# Patient Record
Sex: Female | Born: 1955
Health system: Southern US, Community
[De-identification: ages and names within clinical notes are randomized; demographics above are authoritative.]

## PROBLEM LIST (undated history)

## (undated) DIAGNOSIS — R0600 Dyspnea, unspecified: Secondary | ICD-10-CM

## (undated) DIAGNOSIS — K5790 Diverticulosis of intestine, part unspecified, without perforation or abscess without bleeding: Secondary | ICD-10-CM

## (undated) DIAGNOSIS — Z8719 Personal history of other diseases of the digestive system: Secondary | ICD-10-CM

## (undated) DIAGNOSIS — M1990 Unspecified osteoarthritis, unspecified site: Secondary | ICD-10-CM

## (undated) DIAGNOSIS — R112 Nausea with vomiting, unspecified: Secondary | ICD-10-CM

## (undated) DIAGNOSIS — D473 Essential (hemorrhagic) thrombocythemia: Secondary | ICD-10-CM

## (undated) DIAGNOSIS — I4891 Unspecified atrial fibrillation: Secondary | ICD-10-CM

## (undated) DIAGNOSIS — C73 Malignant neoplasm of thyroid gland: Secondary | ICD-10-CM

## (undated) DIAGNOSIS — R202 Paresthesia of skin: Secondary | ICD-10-CM

## (undated) DIAGNOSIS — K219 Gastro-esophageal reflux disease without esophagitis: Secondary | ICD-10-CM

## (undated) DIAGNOSIS — N2889 Other specified disorders of kidney and ureter: Secondary | ICD-10-CM

## (undated) DIAGNOSIS — D509 Iron deficiency anemia, unspecified: Secondary | ICD-10-CM

## (undated) DIAGNOSIS — F329 Major depressive disorder, single episode, unspecified: Secondary | ICD-10-CM

## (undated) DIAGNOSIS — C641 Malignant neoplasm of right kidney, except renal pelvis: Secondary | ICD-10-CM

## (undated) DIAGNOSIS — M2140 Flat foot [pes planus] (acquired), unspecified foot: Secondary | ICD-10-CM

## (undated) DIAGNOSIS — F32A Depression, unspecified: Secondary | ICD-10-CM

## (undated) DIAGNOSIS — M25579 Pain in unspecified ankle and joints of unspecified foot: Secondary | ICD-10-CM

## (undated) DIAGNOSIS — R000 Tachycardia, unspecified: Secondary | ICD-10-CM

## (undated) DIAGNOSIS — R200 Anesthesia of skin: Secondary | ICD-10-CM

## (undated) DIAGNOSIS — E785 Hyperlipidemia, unspecified: Secondary | ICD-10-CM

## (undated) DIAGNOSIS — I499 Cardiac arrhythmia, unspecified: Secondary | ICD-10-CM

## (undated) DIAGNOSIS — E559 Vitamin D deficiency, unspecified: Secondary | ICD-10-CM

## (undated) DIAGNOSIS — E039 Hypothyroidism, unspecified: Secondary | ICD-10-CM

## (undated) DIAGNOSIS — E079 Disorder of thyroid, unspecified: Secondary | ICD-10-CM

## (undated) DIAGNOSIS — E538 Deficiency of other specified B group vitamins: Secondary | ICD-10-CM

## (undated) DIAGNOSIS — R002 Palpitations: Secondary | ICD-10-CM

## (undated) DIAGNOSIS — Z9889 Other specified postprocedural states: Secondary | ICD-10-CM

## (undated) DIAGNOSIS — K909 Intestinal malabsorption, unspecified: Secondary | ICD-10-CM

## (undated) DIAGNOSIS — D75839 Thrombocytosis, unspecified: Secondary | ICD-10-CM

## (undated) DIAGNOSIS — I10 Essential (primary) hypertension: Secondary | ICD-10-CM

## (undated) HISTORY — DX: Intestinal malabsorption, unspecified: K90.9

## (undated) HISTORY — DX: Gastro-esophageal reflux disease without esophagitis: K21.9

## (undated) HISTORY — PX: BREAST BIOPSY: SHX20

## (undated) HISTORY — DX: Major depressive disorder, single episode, unspecified: F32.9

## (undated) HISTORY — DX: Essential (hemorrhagic) thrombocythemia: D47.3

## (undated) HISTORY — DX: Disorder of thyroid, unspecified: E07.9

## (undated) HISTORY — DX: Malignant neoplasm of thyroid gland: C73

## (undated) HISTORY — DX: Malignant neoplasm of right kidney, except renal pelvis: C64.1

## (undated) HISTORY — DX: Palpitations: R00.2

## (undated) HISTORY — DX: Vitamin D deficiency, unspecified: E55.9

## (undated) HISTORY — DX: Paresthesia of skin: R20.0

## (undated) HISTORY — DX: Unspecified atrial fibrillation: I48.91

## (undated) HISTORY — DX: Depression, unspecified: F32.A

## (undated) HISTORY — DX: Flat foot (pes planus) (acquired), unspecified foot: M21.40

## (undated) HISTORY — DX: Essential (primary) hypertension: I10

## (undated) HISTORY — DX: Hyperlipidemia, unspecified: E78.5

## (undated) HISTORY — DX: Pain in unspecified ankle and joints of unspecified foot: M25.579

## (undated) HISTORY — DX: Paresthesia of skin: R20.2

---

## 1990-07-16 HISTORY — PX: CHOLECYSTECTOMY: SHX55

## 2002-07-01 ENCOUNTER — Ambulatory Visit (HOSPITAL_COMMUNITY): Admission: RE | Admit: 2002-07-01 | Discharge: 2002-07-01 | Payer: Self-pay | Admitting: Obstetrics & Gynecology

## 2002-07-01 ENCOUNTER — Encounter: Payer: Self-pay | Admitting: Obstetrics & Gynecology

## 2004-07-16 ENCOUNTER — Emergency Department (HOSPITAL_COMMUNITY): Admission: EM | Admit: 2004-07-16 | Discharge: 2004-07-16 | Payer: Self-pay | Admitting: Emergency Medicine

## 2013-04-09 ENCOUNTER — Other Ambulatory Visit: Payer: Self-pay | Admitting: Family Medicine

## 2013-04-09 ENCOUNTER — Ambulatory Visit (INDEPENDENT_AMBULATORY_CARE_PROVIDER_SITE_OTHER): Payer: 59 | Admitting: Family Medicine

## 2013-04-09 ENCOUNTER — Ambulatory Visit: Payer: 59

## 2013-04-09 VITALS — BP 118/92 | HR 100 | Temp 98.5°F | Resp 18 | Ht 63.0 in | Wt 188.0 lb

## 2013-04-09 DIAGNOSIS — M25519 Pain in unspecified shoulder: Secondary | ICD-10-CM

## 2013-04-09 DIAGNOSIS — R899 Unspecified abnormal finding in specimens from other organs, systems and tissues: Secondary | ICD-10-CM

## 2013-04-09 DIAGNOSIS — R509 Fever, unspecified: Secondary | ICD-10-CM

## 2013-04-09 DIAGNOSIS — E0590 Thyrotoxicosis, unspecified without thyrotoxic crisis or storm: Secondary | ICD-10-CM

## 2013-04-09 DIAGNOSIS — M25511 Pain in right shoulder: Secondary | ICD-10-CM

## 2013-04-09 LAB — POCT URINALYSIS DIPSTICK
Bilirubin, UA: NEGATIVE
Blood, UA: NEGATIVE
Glucose, UA: NEGATIVE
Ketones, UA: NEGATIVE
Leukocytes, UA: NEGATIVE
Nitrite, UA: NEGATIVE
Protein, UA: NEGATIVE
Spec Grav, UA: 1.01
Urobilinogen, UA: 0.2
pH, UA: 6

## 2013-04-09 LAB — POCT CBC
Granulocyte percent: 68.4 %G (ref 37–80)
HCT, POC: 43.2 % (ref 37.7–47.9)
Hemoglobin: 13.9 g/dL (ref 12.2–16.2)
Lymph, poc: 2.4 (ref 0.6–3.4)
MCH, POC: 27.7 pg (ref 27–31.2)
MCHC: 32.2 g/dL (ref 31.8–35.4)
MCV: 86 fL (ref 80–97)
MID (cbc): 0.8 (ref 0–0.9)
MPV: 9.3 fL (ref 0–99.8)
POC Granulocyte: 6.9 (ref 2–6.9)
POC LYMPH PERCENT: 24 %L (ref 10–50)
POC MID %: 7.6 %M (ref 0–12)
Platelet Count, POC: 456 10*3/uL — AB (ref 142–424)
RBC: 5.02 M/uL (ref 4.04–5.48)
RDW, POC: 14.5 %
WBC: 10.1 10*3/uL (ref 4.6–10.2)

## 2013-04-09 LAB — COMPREHENSIVE METABOLIC PANEL
ALT: 13 U/L (ref 0–35)
AST: 15 U/L (ref 0–37)
Albumin: 3.8 g/dL (ref 3.5–5.2)
Alkaline Phosphatase: 68 U/L (ref 39–117)
BUN: 7 mg/dL (ref 6–23)
CO2: 27 mEq/L (ref 19–32)
Calcium: 9.6 mg/dL (ref 8.4–10.5)
Chloride: 100 mEq/L (ref 96–112)
Creat: 0.46 mg/dL — ABNORMAL LOW (ref 0.50–1.10)
Glucose, Bld: 94 mg/dL (ref 70–99)
Potassium: 4.2 mEq/L (ref 3.5–5.3)
Sodium: 138 mEq/L (ref 135–145)
Total Bilirubin: 0.4 mg/dL (ref 0.3–1.2)
Total Protein: 7.4 g/dL (ref 6.0–8.3)

## 2013-04-09 LAB — POCT UA - MICROSCOPIC ONLY
RBC, urine, microscopic: NEGATIVE
WBC, Ur, HPF, POC: NEGATIVE

## 2013-04-09 LAB — POCT SEDIMENTATION RATE: POCT SED RATE: 67 mm/hr — AB (ref 0–22)

## 2013-04-09 LAB — TSH: TSH: 0.01 u[IU]/mL — ABNORMAL LOW (ref 0.350–4.500)

## 2013-04-09 LAB — CK: Total CK: 20 U/L (ref 7–177)

## 2013-04-09 MED ORDER — CYCLOBENZAPRINE HCL 10 MG PO TABS: 10.0000 mg | ORAL_TABLET | Freq: Two times a day (BID) | ORAL | Status: DC | PRN

## 2013-04-09 MED ORDER — DOXYCYCLINE HYCLATE 100 MG PO TABS: 100.0000 mg | ORAL_TABLET | Freq: Two times a day (BID) | ORAL | Status: DC

## 2013-04-09 NOTE — Patient Instructions (Addendum)
I will be in touch with you when the rest of your labs come in. Please try the flexeril for your neck pain, and the doxycycline for any potential cause of your fever.    Please let me know if you are not feeling better in the next few days-Sooner if worse.   Please schedule a mammogram soon.

## 2013-04-09 NOTE — Progress Notes (Signed)
[facility] [address] [place] [address] [phone]  Date:  04/09/2013   Name:  Karen Wall   DOB:  06/12/1956   MRN:  [id number]  PCP:  No PCP Per Patient    Chief Complaint: Shoulder Pain and Fever   History of Present Illness:  Karen Wall is a 57 y.o. very pleasant female patient who presents with the following:  Today is Thursday.  She woke up on Monday with an ache in her right shoulder.  That evening she had a temp of 104.  The next day she felt better although her arm continued to hurt.  Tuesday evening her temp was 102.  Her temp has continued to be 102 every evening.  She has not noted cough, nausea/ vomiting, no diarrhea. She has a HA but only when her temperature is up.   No ST, no earache, no sneezing, no runny nose No chest pain.    She is generally very healthy.  No specific sick contacts but she is an Charity fundraiser at [employer].  She did take tylneol, last at 3am today.   She has been off of work this week   No chest pain while working or while active.    Her father had several MIs- he died at 45 after 2 pacemakers.  Her mother is healthy at 80.  There is a lot of CAD/ MI on her father's side. Never a smoker She does walk about 2 miles a day- no chest pain.  She does sometimes note occasional palpitations but this is not new She does not have a cardiologist, has never had a stress test.   There are no active problems to display for this patient.   History reviewed. No pertinent past medical history.  History reviewed. No pertinent past surgical history.  History  Substance Use Topics  . Smoking status: Never Smoker   . Smokeless tobacco: Not on file  . Alcohol Use: No    Family History  Problem Relation Age of Onset  . Hypertension Mother   . Mental illness Father   . Mental illness Maternal Grandmother   . Stroke Maternal Grandfather     Allergies  Allergen Reactions  . Penicillins     Medication list has been  reviewed and updated.  No current outpatient prescriptions on file prior to visit.   No current facility-administered medications on file prior to visit.    Review of Systems:  As per HPI- otherwise negative.   Physical Examination: Filed Vitals:   04/09/13 1118  BP: 118/92  Pulse: 100  Temp: 98.5 F (36.9 C)  Resp: 18   Filed Vitals:   04/09/13 1118  Height: 5\' 3"  (1.6 m)  Weight: 188 lb (85.276 kg)   Body mass index is 33.31 kg/(m^2). Ideal Body Weight: Weight in (lb) to have BMI = 25: 140.8  GEN: WDWN, NAD, Non-toxic, A & O x 3 HEENT: Atraumatic, Normocephalic. Neck supple. No masses, No LAD. Bilateral TM wnl, oropharynx normal.  PEERL,EOMI.   Ears and Nose: No external deformity. CV: RRR, No M/G/R. No JVD. No thrill. No extra heart sounds. PULM: CTA B, no wheezes, crackles, rhonchi. No retractions. No resp. distress. No accessory muscle use. ABD: S, NT, ND. No rebound. No HSM. EXTR: No c/c/e NEURO Normal gait.  PSYCH: Normally interactive. Conversant. Not depressed or anxious appearing.  Calm demeanor.  She is very tender in the right trapezius muscles.  No rash, lesion, or redness.  She is also tender in the right sided cervical muscles, but less so than the trapezius.  She has normal ROM of the shoulder, no pain with shoulder movement.   Breasts: normal bilaterally, no discharge, dimpling or  Normal cervical ROM.    [facility] reading (PRIMARY) by  Dr. Patsy Lager. CXR: negative, mild degenerative change Cervical spine: loss of lordosis, otherwise negative  CERVICAL SPINE - COMPLETE 4+ VIEW  Comparison: None.  Findings:  C1 to the superior endplate of T2 is imaged on the provided lateral radiograph.  There is minimal straightening of the expected cervical lordosis. No anterolisthesis or retrolisthesis. The bilateral facets are normally aligned. The dens is normally positioned between the lateral masses of C1.  Cervical vertebral body heights are preserved.  Prevertebral soft tissues are normal.  Intervertebral disc spaces are preserved.  There is very minimal rightward deviation of the mid aspect of the tracheal air column cranial to the thoracic inlet. Limited visualization of the lung apices is normal.  IMPRESSION: 1. No explanation for patient's right-sided shoulder pain. Specifically, no evidence of significant cervical DDD.  2. Very mild rightward deviation of the mid aspect of the tracheal air column cranial to the thoracic inlet, nonspecific but could be seen with enlargement of the left lobe of the thyroid. Clinical correlation is advised. Further evaluation with thyroid ultrasound may be performed as clinically indicated.  CHEST - 2 VIEW  Comparison: None.  Findings:  Normal cardiac silhouette and mediastinal contours. Retrocardiac air and fluid containing structure is compatible with a hiatal hernia. No focal airspace opacities. No pleural effusion or pneumothorax. No evidence of edema. Degenerative change within the mid/lower thoracic spine. Post cholecystectomy.  IMPRESSION: 1. No acute cardiopulmonary disease. Specifically, no evidence of pneumonia. 2. Small hiatal hernia.  EKG: minimal ST depression in chest leads.  Spoke with Dr. Tenny Craw- insignificant. No evidence of pericarditis.    Results for orders placed in visit on 04/09/13  POCT CBC      Result Value Range   WBC 10.1  4.6 - 10.2 K/uL   Lymph, poc 2.4  0.6 - 3.4   POC LYMPH PERCENT 24.0  10 - 50 %L   MID (cbc) 0.8  0 - 0.9   POC MID % 7.6  0 - 12 %M   POC Granulocyte 6.9  2 - 6.9   Granulocyte percent 68.4  37 - 80 %G   RBC 5.02  4.04 - 5.48 M/uL   Hemoglobin 13.9  12.2 - 16.2 g/dL   HCT, POC 40.9  81.1 - 47.9 %   MCV 86.0  80 - 97 fL   MCH, POC 27.7  27 - 31.2 pg   MCHC 32.2  31.8 - 35.4 g/dL   RDW, POC 91.4     Platelet Count, POC 456 (*) 142 - 424 K/uL   MPV 9.3  0 - 99.8 fL  POCT SEDIMENTATION RATE      Result Value Range   POCT SED RATE  67 (*) 0 - 22 mm/hr  POCT UA - MICROSCOPIC ONLY      Result Value Range   WBC, Ur, HPF, POC neg     RBC, urine, microscopic neg     Bacteria, U Microscopic neg     Mucus, UA neg     Epithelial cells, urine per micros 0-2     Crystals, Ur, HPF, POC neg     Casts, Ur, LPF, POC neg     Yeast, UA neg  POCT URINALYSIS DIPSTICK      Result Value Range   Color, UA yellow     Clarity, UA clear     Glucose, UA neg     Bilirubin, UA neg     Ketones, UA neg     Spec Grav, UA 1.010     Blood, UA neg     pH, UA 6.0     Protein, UA neg     Urobilinogen, UA 0.2     Nitrite, UA neg     Leukocytes, UA Negative      Assessment and Plan: Pain in right shoulder - Plan: CK, DG Chest 2 View, DG Cervical Spine Complete, EKG 12-Lead, cyclobenzaprine (FLEXERIL) 10 MG tablet  Fever, unspecified - Plan: POCT CBC, Comprehensive metabolic panel, POCT SEDIMENTATION RATE, doxycycline (VIBRA-TABS) 100 MG tablet, POCT UA - Microscopic Only, POCT urinalysis dipstick, Urine culture, TSH  Tenderness in the right trapezius muscle, and fever of unknown origin.   Urine is benign, no evidence of pneumonia, abdomen benign.  Await other labs as above.  Flexeril for muscle pain.  Doxycycline for any possible tick borne illness.    Signed Abbe Amsterdam, MD

## 2013-04-10 ENCOUNTER — Other Ambulatory Visit: Payer: Self-pay

## 2013-04-10 ENCOUNTER — Telehealth: Payer: Self-pay | Admitting: Endocrinology

## 2013-04-10 ENCOUNTER — Telehealth: Payer: Self-pay | Admitting: Radiology

## 2013-04-10 ENCOUNTER — Ambulatory Visit
Admission: RE | Admit: 2013-04-10 | Discharge: 2013-04-10 | Disposition: A | Discharge status: Home / Self Care | Payer: 59 | Source: Ambulatory Visit | Attending: Family Medicine | Admitting: Family Medicine

## 2013-04-10 DIAGNOSIS — R7989 Other specified abnormal findings of blood chemistry: Secondary | ICD-10-CM

## 2013-04-10 DIAGNOSIS — E042 Nontoxic multinodular goiter: Secondary | ICD-10-CM

## 2013-04-10 LAB — URINE CULTURE
Colony Count: NO GROWTH
Organism ID, Bacteria: NO GROWTH

## 2013-04-10 LAB — T4, FREE: Free T4: 1.54 ng/dL (ref 0.80–1.80)

## 2013-04-10 MED ORDER — METHIMAZOLE 10 MG PO TABS: 40.0000 mg | ORAL_TABLET | Freq: Two times a day (BID) | ORAL | Status: DC

## 2013-04-10 NOTE — Telephone Encounter (Signed)
Dr. Dallas Schimke called and spoke w/ Dr. Everardo All. Per Dr. Everardo All, pt is to be added on at 100pm on Monday or Tuesday of next week for "thyroid storm". Called pt to sch appt this afternoon, she says she does not believe this is the plan of care for her any longer and she will call us back on Monday / Sherri S.

## 2013-04-10 NOTE — Telephone Encounter (Signed)
IMPRESSION:  Bilateral thyroid nodules, the largest which are mixed solid and  cystic in appearance as detailed above. Fine needle aspirate may be  considered to help exclude the possibility malignancy.   Call report :Patient advised biopsy needed and this has been ordered. To you FYI

## 2013-04-10 NOTE — Progress Notes (Signed)
Received further lab results. Low TSH, she has large thyroid nodules.  Discussed with Dr. Everardo All at [facility].  Plan for close follow-up- the first of the week.  Will treat with tapazole 40 BID starting today.  Discussed with pt.  She had a temp of 100.5 last night, no GI symptoms.

## 2013-04-10 NOTE — Addendum Note (Signed)
Addended by: Abbe Amsterdam C on: 04/10/2013 05:47 PM   Modules accepted: Orders

## 2013-04-10 NOTE — Progress Notes (Signed)
Patient aware TSH was abnormal and appointment for Thyroid U/S today at 12:15.

## 2013-04-11 ENCOUNTER — Other Ambulatory Visit: Payer: Self-pay | Admitting: Endocrinology

## 2013-04-11 ENCOUNTER — Telehealth: Payer: Self-pay | Admitting: Family Medicine

## 2013-04-11 ENCOUNTER — Telehealth: Payer: Self-pay

## 2013-04-11 DIAGNOSIS — E0590 Thyrotoxicosis, unspecified without thyrotoxic crisis or storm: Secondary | ICD-10-CM

## 2013-04-11 MED ORDER — METHIMAZOLE 10 MG PO TABS: 40.0000 mg | ORAL_TABLET | Freq: Two times a day (BID) | ORAL | Status: DC

## 2013-04-11 NOTE — Telephone Encounter (Signed)
Spoke with Robin at [facility]. Tests added.  

## 2013-04-11 NOTE — Telephone Encounter (Signed)
Message copied by Johnnette Litter on Sat Apr 11, 2013  8:58 AM ------      Message from: Abbe Amsterdam C      Created: Thu Apr 09, 2013  4:15 PM       Please add on a CRP, ANA and RF      Muscle pain      Thanks!  JC ------

## 2013-04-11 NOTE — Telephone Encounter (Signed)
please call patient: Yes, please take this medication. It is important to keep the appointment, to get your thyroid better.

## 2013-04-11 NOTE — Telephone Encounter (Signed)
Called to check in with her- she is feeling ok, a little nauseated but she notes that her pulse has come down to 70 BPM, which is normal for her.  I will get her an appt set up with endocrine first thing Monday am

## 2013-04-13 ENCOUNTER — Telehealth: Payer: Self-pay | Admitting: Radiology

## 2013-04-13 ENCOUNTER — Encounter: Payer: Self-pay | Admitting: Endocrinology

## 2013-04-13 ENCOUNTER — Other Ambulatory Visit: Payer: Self-pay | Admitting: Family Medicine

## 2013-04-13 ENCOUNTER — Ambulatory Visit (INDEPENDENT_AMBULATORY_CARE_PROVIDER_SITE_OTHER): Payer: 59 | Admitting: Endocrinology

## 2013-04-13 VITALS — BP 126/80 | HR 75 | Wt 189.0 lb

## 2013-04-13 DIAGNOSIS — E042 Nontoxic multinodular goiter: Secondary | ICD-10-CM

## 2013-04-13 DIAGNOSIS — E0590 Thyrotoxicosis, unspecified without thyrotoxic crisis or storm: Secondary | ICD-10-CM

## 2013-04-13 LAB — ANA: Anti Nuclear Antibody(ANA): NEGATIVE

## 2013-04-13 NOTE — Telephone Encounter (Signed)
Pt advised, please schedule

## 2013-04-13 NOTE — Patient Instructions (Addendum)
Please reduce the methimazole to 10 mg, twice daily. Please come back for a follow-up appointment in 1 month. if ever you have fever while taking methimazole, stop it and call us, because of the risk of a rare side-effect.

## 2013-04-13 NOTE — Progress Notes (Signed)
Subjective:    Patient ID: Karen Wall, female    DOB: 1956-02-01, 57 y.o.   MRN: [id number]  HPI Pt states few months of mild intermittent palpitations in the chest, and associated fatigue.  She has nausea since on the tapazole. No past medical history on file.  No past surgical history on file.  History   Social History  . Marital Status: Divorced    Spouse Name: N/A    Number of Children: N/A  . Years of Education: N/A   Occupational History  . Not on file.   Social History Main Topics  . Smoking status: Never Smoker   . Smokeless tobacco: Not on file  . Alcohol Use: No  . Drug Use: No  . Sexual Activity: Not on file   Other Topics Concern  . Not on file   Social History Narrative  . No narrative on file    Current Outpatient Prescriptions on File Prior to Visit  Medication Sig Dispense Refill  . acetaminophen (TYLENOL) 500 MG tablet Take 500 mg by mouth every 6 (six) hours as needed for pain.      . cyclobenzaprine (FLEXERIL) 10 MG tablet Take 1 tablet (10 mg total) by mouth 2 (two) times daily as needed for muscle spasms.  30 tablet  0  . doxycycline (VIBRA-TABS) 100 MG tablet Take 1 tablet (100 mg total) by mouth 2 (two) times daily.  20 tablet  0  . methimazole (TAPAZOLE) 10 MG tablet Take 4 tablets (40 mg total) by mouth 2 (two) times daily.  120 tablet  0   No current facility-administered medications on file prior to visit.    Allergies  Allergen Reactions  . Penicillins     Family History  Problem Relation Age of Onset  . Hypertension Mother   . Mental illness Father   . Mental illness Maternal Grandmother   . Stroke Maternal Grandfather   no goiter or other thyroid problems.   BP 126/80  Pulse 75  Wt 189 lb (85.73 kg)  BMI 33.49 kg/m2  SpO2 97%  Review of Systems She has difficulty losing weight.  Fever is resolved.  She has headache, menopausal sxs, slight muscle weakness, excessive diaphoresis, and tremor.  She denies hoarseness,  double vision, chest pain, sob, diarrhea, polyuria, numbness, anxiety, easy bruising, and rhinorrhea.      Objective:   Physical Exam VS: see vs page GEN: no distress HEAD: head: no deformity eyes: no periorbital swelling, no proptosis external nose and ears are normal mouth: no lesion seen NECK: 2 large thyroid nodules, 1 on each lobe CHEST WALL: no deformity.   LUNGS:  Clear to auscultation CV: reg rate and rhythm, no murmur ABD: abdomen is soft, nontender.  no hepatosplenomegaly.  not distended.  no hernia MUSCULOSKELETAL: muscle bulk and strength are grossly normal.  no obvious joint swelling.  gait is normal and steady EXTEMITIES: no deformity.  no edema PULSES: no carotid bruit.   NEURO:  cn 2-12 grossly intact.   readily moves all 4's.  sensation is intact to touch on all 4's.  No tremor SKIN:  Normal texture and temperature.  No rash or suspicious lesion is visible.  Not diaphoretic NODES:  None palpable at the neck PSYCH: alert, oriented x3.  Does not appear anxious nor depressed.   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  Nausea, due to tapazole.  Fever, resolved, probably not thyroid-related.

## 2013-04-13 NOTE — Telephone Encounter (Signed)
Spoke to [facility]. Patient to proceed with biopsy of thyroid.

## 2013-04-14 ENCOUNTER — Telehealth: Payer: Self-pay | Admitting: Radiology

## 2013-04-14 ENCOUNTER — Other Ambulatory Visit: Payer: Self-pay | Admitting: Radiology

## 2013-04-14 DIAGNOSIS — E042 Nontoxic multinodular goiter: Secondary | ICD-10-CM

## 2013-04-14 NOTE — Telephone Encounter (Signed)
Patient ordered for thyroid biopsy. But Dr Patsy Lager is unsure if Dr Everardo All wants her to proceed with this. Have left message for patient to call me back to advise.

## 2013-04-15 NOTE — Telephone Encounter (Signed)
I did speak to the patient about the biopsy. Dr Everardo All indicated about 10% of cases are malignant, patient does want to proceed with the biopsy, it has been ordered. To you FYI

## 2013-04-16 ENCOUNTER — Other Ambulatory Visit: Payer: 59

## 2013-04-16 ENCOUNTER — Inpatient Hospital Stay
Admission: RE | Admit: 2013-04-16 | Discharge: 2013-04-16 | Disposition: A | Discharge status: Home / Self Care | Payer: 59 | Source: Ambulatory Visit | Attending: Family Medicine | Admitting: Family Medicine

## 2013-04-21 ENCOUNTER — Ambulatory Visit
Admission: RE | Admit: 2013-04-21 | Discharge: 2013-04-21 | Disposition: A | Discharge status: Home / Self Care | Payer: 59 | Source: Ambulatory Visit | Attending: Family Medicine | Admitting: Family Medicine

## 2013-04-21 ENCOUNTER — Other Ambulatory Visit (HOSPITAL_COMMUNITY)
Admission: RE | Admit: 2013-04-21 | Discharge: 2013-04-21 | Disposition: A | Discharge status: Home / Self Care | Payer: 59 | Source: Ambulatory Visit | Attending: Interventional Radiology | Admitting: Interventional Radiology

## 2013-04-21 DIAGNOSIS — E042 Nontoxic multinodular goiter: Secondary | ICD-10-CM

## 2013-04-21 DIAGNOSIS — E041 Nontoxic single thyroid nodule: Secondary | ICD-10-CM | POA: Insufficient documentation

## 2013-04-22 ENCOUNTER — Telehealth: Payer: Self-pay | Admitting: Family Medicine

## 2013-04-22 NOTE — Telephone Encounter (Signed)
LMOM that her path reports shows a non- neoplastic goiter.  Also sent a flag to Dr. Everardo All that her results are in.

## 2013-04-23 NOTE — Addendum Note (Signed)
Addended by: Abbe Amsterdam C on: 04/23/2013 04:57 PM   Modules accepted: Orders

## 2013-05-12 ENCOUNTER — Ambulatory Visit (INDEPENDENT_AMBULATORY_CARE_PROVIDER_SITE_OTHER): Payer: 59 | Admitting: Endocrinology

## 2013-05-12 ENCOUNTER — Encounter: Payer: Self-pay | Admitting: Endocrinology

## 2013-05-12 VITALS — BP 124/80 | Wt 191.6 lb

## 2013-05-12 DIAGNOSIS — E0590 Thyrotoxicosis, unspecified without thyrotoxic crisis or storm: Secondary | ICD-10-CM

## 2013-05-12 LAB — C-REACTIVE PROTEIN: CRP: 1.2 mg/dL — ABNORMAL HIGH (ref <0.60)

## 2013-05-12 LAB — T4, FREE: Free T4: 0.8 ng/dL (ref 0.80–1.80)

## 2013-05-12 LAB — TSH: TSH: 0.014 u[IU]/mL — ABNORMAL LOW (ref 0.350–4.500)

## 2013-05-12 LAB — SEDIMENTATION RATE: Sed Rate: 11 mm/hr (ref 0–22)

## 2013-05-12 NOTE — Progress Notes (Signed)
  Subjective:    Patient ID: Karen Wall, female    DOB: 26-Nov-1955, 57 y.o.   MRN: [id number]  HPI In September of 2014, pt was dx'ed with hyperthyroidism due to a multinodular goiter.  She chose tapazole rx.  Since the it was reduced, pt states she feels well in general.  No past medical history on file.  No past surgical history on file.  History   Social History  . Marital Status: Divorced    Spouse Name: N/A    Number of Children: N/A  . Years of Education: N/A   Occupational History  . Not on file.   Social History Main Topics  . Smoking status: Never Smoker   . Smokeless tobacco: Not on file  . Alcohol Use: No  . Drug Use: No  . Sexual Activity: Not on file   Other Topics Concern  . Not on file   Social History Narrative  . No narrative on file    Current Outpatient Prescriptions on File Prior to Visit  Medication Sig Dispense Refill  . acetaminophen (TYLENOL) 500 MG tablet Take 500 mg by mouth every 6 (six) hours as needed for pain.      . cyclobenzaprine (FLEXERIL) 10 MG tablet Take 1 tablet (10 mg total) by mouth 2 (two) times daily as needed for muscle spasms.  30 tablet  0  . methimazole (TAPAZOLE) 10 MG tablet Take 10 mg by mouth 2 (two) times daily.      Marland Kitchen doxycycline (VIBRA-TABS) 100 MG tablet Take 1 tablet (100 mg total) by mouth 2 (two) times daily.  20 tablet  0   No current facility-administered medications on file prior to visit.    Allergies  Allergen Reactions  . Penicillins     Family History  Problem Relation Age of Onset  . Hypertension Mother   . Mental illness Father   . Mental illness Maternal Grandmother   . Stroke Maternal Grandfather    BP 124/80  Wt 191 lb 9.6 oz (86.909 kg)  BMI 33.95 kg/m2  Review of Systems Denies fever    Objective:   Physical Exam VITAL SIGNS:  See vs page GENERAL: no distress NECK: 2 large thyroid nodules, 1 on each lobe.  Lab Results  Component Value Date   TSH 0.014* 05/12/2013       Assessment & Plan:  Multinodular goiter, which is usually hereditary. Hyperthyroidism, due to the goiter, improved.  We discussed the rx options.  She has chosen to continue tapazole for now.

## 2013-05-12 NOTE — Patient Instructions (Addendum)
blood tests are being requested for you today.  We'll contact you with results. Please come back for a follow-up appointment in 6 weeks.   if ever you have fever while taking methimazole, stop it and call us, because of the risk of a rare side-effect. 

## 2013-05-13 ENCOUNTER — Encounter: Payer: Self-pay | Admitting: Family Medicine

## 2013-05-21 ENCOUNTER — Other Ambulatory Visit: Payer: Self-pay

## 2013-05-21 ENCOUNTER — Other Ambulatory Visit: Payer: Self-pay | Admitting: Endocrinology

## 2013-05-21 MED ORDER — METHIMAZOLE 10 MG PO TABS: 10.0000 mg | ORAL_TABLET | Freq: Two times a day (BID) | ORAL | Status: DC

## 2013-06-23 ENCOUNTER — Ambulatory Visit (INDEPENDENT_AMBULATORY_CARE_PROVIDER_SITE_OTHER): Payer: 59 | Admitting: Endocrinology

## 2013-06-23 ENCOUNTER — Encounter: Payer: Self-pay | Admitting: Endocrinology

## 2013-06-23 VITALS — BP 128/80 | HR 67 | Temp 98.2°F | Ht 64.0 in | Wt 192.0 lb

## 2013-06-23 DIAGNOSIS — E0590 Thyrotoxicosis, unspecified without thyrotoxic crisis or storm: Secondary | ICD-10-CM

## 2013-06-23 DIAGNOSIS — E042 Nontoxic multinodular goiter: Secondary | ICD-10-CM

## 2013-06-23 LAB — TSH: TSH: 10.64 u[IU]/mL — ABNORMAL HIGH (ref 0.35–5.50)

## 2013-06-23 NOTE — Patient Instructions (Signed)
blood tests are being requested for you today.  We'll contact you with results. Refer to dr gerkin.  you will receive a phone call, about a day and time for an appointment. Please come back for a follow-up appointment in 1 month.

## 2013-06-23 NOTE — Progress Notes (Signed)
   Subjective:    Patient ID: Karen Wall, female    DOB: [birth year], 57 y.o.   MRN: [id number]  HPI In September of 2014, pt was dx'ed with hyperthyroidism due to a multinodular goiter.  She never had XRT.  Bx was benign.  She chose tapazole rx for now, but she wants to consider surgery when TFT are better.  She has a sensation of fullness at the anterior neck.   No past medical history on file.  No past surgical history on file.  History   Social History  . Marital Status: Divorced    Spouse Name: N/A    Number of Children: N/A  . Years of Education: N/A   Occupational History  . Not on file.   Social History Main Topics  . Smoking status: Never Smoker   . Smokeless tobacco: Not on file  . Alcohol Use: No  . Drug Use: No  . Sexual Activity: Not on file   Other Topics Concern  . Not on file   Social History Narrative  . No narrative on file    Current Outpatient Prescriptions on File Prior to Visit  Medication Sig Dispense Refill  . acetaminophen (TYLENOL) 500 MG tablet Take 500 mg by mouth every 6 (six) hours as needed for pain.      . cyclobenzaprine (FLEXERIL) 10 MG tablet Take 1 tablet (10 mg total) by mouth 2 (two) times daily as needed for muscle spasms.  30 tablet  0  . doxycycline (VIBRA-TABS) 100 MG tablet Take 1 tablet (100 mg total) by mouth 2 (two) times daily.  20 tablet  0   No current facility-administered medications on file prior to visit.   Allergies  Allergen Reactions  . Penicillins    Family History  Problem Relation Age of Onset  . Hypertension Mother   . Mental illness Father   . Mental illness Maternal Grandmother   . Stroke Maternal Grandfather    BP 128/80  Pulse 67  Temp(Src) 98.2 F (36.8 C) (Oral)  Ht 5\' 4"  (1.626 m)  Wt 192 lb (87.091 kg)  BMI 32.94 kg/m2  SpO2 98%  Review of Systems She has weight gain    Objective:   Physical Exam VITAL SIGNS:  See vs page GENERAL: no distress NECK: 2 large thyroid nodules, 1  on each lobe.   Lab Results  Component Value Date   TSH 10.64* 06/23/2013      Assessment & Plan:  Multinodular goiter clinically stable Hyperthyroidism, due to the goiter.  overcontrolled.

## 2013-06-29 ENCOUNTER — Encounter (INDEPENDENT_AMBULATORY_CARE_PROVIDER_SITE_OTHER): Payer: Self-pay | Admitting: Surgery

## 2013-06-29 ENCOUNTER — Ambulatory Visit (INDEPENDENT_AMBULATORY_CARE_PROVIDER_SITE_OTHER): Payer: Commercial Managed Care - PPO | Admitting: Surgery

## 2013-06-29 VITALS — BP 118/76 | HR 68 | Temp 98.1°F | Resp 16 | Ht 64.0 in | Wt 191.8 lb

## 2013-06-29 DIAGNOSIS — E042 Nontoxic multinodular goiter: Secondary | ICD-10-CM

## 2013-06-29 DIAGNOSIS — E0590 Thyrotoxicosis, unspecified without thyrotoxic crisis or storm: Secondary | ICD-10-CM

## 2013-06-29 NOTE — Patient Instructions (Signed)

## 2013-06-29 NOTE — Progress Notes (Signed)
General Surgery - [facility]  Chief Complaint  Karen presents with  . New Evaluation    multinodular goiter with hyperthyroidism - referral from Dr. Sean Ellison, [place]    HISTORY: Karen is a 57-year-old female referred by her endocrinologist for multinodular goiter with hyperthyroidism. Karen has been on methimazole since September 2014. Ultrasound shows an enlarged thyroid gland with multiple bilateral nodules. The largest nodule in the right measures 3.7 cm in the largest nodule on the left measures 3.0 cm. Fine-needle aspiration biopsy was performed and shows changes consistent with nonneoplastic goiter. Karen is currently taking Tapazole 20 mg daily.  TSH levels were markedly suppressed at 0.010.  On Tapazole, her current level is just over 10.  Karen has no prior history of head or neck surgery. She had never been on thyroid medication prior to September. There is no family history of thyroid disease and no family history of other endocrinopathy.  Past Medical History  Diagnosis Date  . GERD (gastroesophageal reflux disease)   . Thyroid disease     hyperthyroidism    Current Outpatient Prescriptions  Medication Sig Dispense Refill  . acetaminophen (TYLENOL) 500 MG tablet Take 500 mg by mouth every 6 (six) hours Karen needed for pain.      . cyclobenzaprine (FLEXERIL) 10 MG tablet Take 1 tablet (10 mg total) by mouth 2 (two) times daily Karen needed for muscle spasms.  30 tablet  0  . diphenhydrAMINE (BENADRYL) 25 MG tablet Take 25 mg by mouth every 6 (six) hours Karen needed.      . methimazole (TAPAZOLE) 10 MG tablet Take 10 mg by mouth daily.       No current facility-administered medications for this visit.    Allergies  Allergen Reactions  . Penicillins     Family History  Problem Relation Age of Onset  . Hypertension Mother   . Mental illness Father   . Mental illness Maternal Grandmother   . Stroke Maternal Grandfather     History    Social History  . Marital Status: Divorced    Spouse Name: N/A    Number of Children: N/A  . Years of Education: N/A   Social History Main Topics  . Smoking status: Never Smoker   . Smokeless tobacco: None  . Alcohol Use: No  . Drug Use: No  . Sexual Activity: None   Other Topics Concern  . None   Social History Narrative  . None    REVIEW OF SYSTEMS - PERTINENT POSITIVES ONLY: Denies tremors. Denies palpitations. Denies compressive symptoms. Denies palpable masses with the exception of the dominant nodule in the left thyroid lobe.  EXAM: Filed Vitals:   06/29/13 1510  BP: 118/76  Pulse: 68  Temp: 98.1 F (36.7 C)  Resp: 16    GENERAL: well-developed, well-nourished, no acute distress HEENT: normocephalic; pupils equal and reactive; sclerae clear; dentition good; mucous membranes moist NECK:  Palpable dominant nodule mid left thyroid lobe, approximately 3 cm, mobile, nontender; right thyroid lobe is firm without dominant mass, nontender; asymmetric on extension; no palpable anterior or posterior cervical lymphadenopathy; no supraclavicular masses; no tenderness CHEST: clear to auscultation bilaterally without rales, rhonchi, or wheezes CARDIAC: regular rate and rhythm without significant murmur; peripheral pulses are full EXT:  non-tender without edema; no deformity NEURO: no gross focal deficits; no sign of tremor   LABORATORY RESULTS: See [facility] ([facility]) for most recent results  RADIOLOGY RESULTS: See [facility] ([facility]pic) for most recent results    IMPRESSION: Toxic multinodular goiter, 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 She had discussed radioactive iodine treatment with her endocrinologist. Given the fact that she has large bilateral thyroid nodules and an element of tracheal deviation on chest x-ray, I believe she would be better served with total thyroidectomy for  management of multinodular goiter with hyperthyroidism. Karen and I discussed the risks benefits of the procedure including the potential for recurrent laryngeal nerve injury and injury to parathyroid glands. We discussed the hospital stay to be anticipated. We discussed the location of the surgical incision and its postoperative wound care. We discussed her recovery in time out of work. We discussed the need for lifelong thyroid hormone replacement. She understands and wishes to proceed in the near future.  The risks and benefits of the procedure have been discussed at length with the Karen.  The Karen understands the proposed procedure, potential alternative treatments, and the course of recovery to be expected.  All of the Karen's questions have been answered at this time.  The Karen wishes to proceed with surgery.  Jonette Wassel M. Kharisma Glasner, MD, FACS General & Endocrine Surgery [facility]  Primary Care Physician: COPLAND,JESSICA, MD   

## 2013-07-01 ENCOUNTER — Encounter (HOSPITAL_COMMUNITY): Payer: Self-pay | Admitting: Pharmacy Technician

## 2013-07-01 NOTE — Patient Instructions (Addendum)
AMBERLEY HAMLER  07/01/2013                           YOUR PROCEDURE IS SCHEDULED ON: 07/03/13               PLEASE REPORT TO SHORT STAY CENTER AT : 12:30 pm               CALL THIS NUMBER IF ANY PROBLEMS THE DAY OF SURGERY :               [phone]                      REMEMBER:   Do not eat food or drink liquids AFTER MIDNIGHT  May have clear liquids UNTIL 6 HOURS BEFORE SURGERY (9:00 am)  Clear liquids include soda, tea, black coffee, apple or grape juice, broth.  Take these medicines the morning of surgery with A SIP OF WATER: none   Do not wear jewelry, make-up   Do not wear lotions, powders, or perfumes.   Do not shave legs or underarms 12 hrs. before surgery (men may shave face)  Do not bring valuables to the hospital.  Contacts, dentures or bridgework may not be worn into surgery.  Leave suitcase in the car. After surgery it may be brought to your room.  For patients admitted to the hospital more than one night, checkout time is 11:00                          The day of discharge.   Patients discharged the day of surgery will not be allowed to drive home                             If going home same day of surgery, must have someone stay with you first                           24 hrs at home and arrange for some one to drive you home from hospital.    Special Instructions:   Please read over the following fact sheets that you were given:               1. Stop aspirin  And herbal meds 7 days preop                      2. [place] PREPARING FOR SURGERY SHEET                                                X_____________________________________________________________________        Failure to follow these instructions may result in cancellation of your surgery

## 2013-07-02 ENCOUNTER — Other Ambulatory Visit (INDEPENDENT_AMBULATORY_CARE_PROVIDER_SITE_OTHER): Payer: Self-pay

## 2013-07-02 ENCOUNTER — Encounter (INDEPENDENT_AMBULATORY_CARE_PROVIDER_SITE_OTHER): Payer: Self-pay | Admitting: Surgery

## 2013-07-02 ENCOUNTER — Other Ambulatory Visit (INDEPENDENT_AMBULATORY_CARE_PROVIDER_SITE_OTHER): Payer: Self-pay | Admitting: Surgery

## 2013-07-02 ENCOUNTER — Telehealth (INDEPENDENT_AMBULATORY_CARE_PROVIDER_SITE_OTHER): Payer: Self-pay

## 2013-07-02 ENCOUNTER — Encounter (HOSPITAL_COMMUNITY)
Admission: RE | Admit: 2013-07-02 | Discharge: 2013-07-02 | Disposition: A | Discharge status: Home / Self Care | Payer: 59 | Source: Ambulatory Visit | Attending: Surgery | Admitting: Surgery

## 2013-07-02 ENCOUNTER — Encounter (HOSPITAL_COMMUNITY): Payer: Self-pay

## 2013-07-02 DIAGNOSIS — G8918 Other acute postprocedural pain: Secondary | ICD-10-CM

## 2013-07-02 HISTORY — DX: Personal history of other diseases of the digestive system: Z87.19

## 2013-07-02 HISTORY — DX: Anesthesia of skin: R20.0

## 2013-07-02 HISTORY — DX: Cardiac arrhythmia, unspecified: I49.9

## 2013-07-02 LAB — COMPREHENSIVE METABOLIC PANEL
ALT: 12 U/L (ref 0–35)
AST: 17 U/L (ref 0–37)
CO2: 29 mEq/L (ref 19–32)
Chloride: 104 mEq/L (ref 96–112)
Creatinine, Ser: 0.57 mg/dL (ref 0.50–1.10)
GFR calc Af Amer: >90 mL/min (ref >90)
GFR calc non Af Amer: >90 mL/min (ref >90)
Glucose, Bld: 92 mg/dL (ref 70–99)
Sodium: 142 mEq/L (ref 135–145)
Total Bilirubin: 0.3 mg/dL (ref 0.3–1.2)

## 2013-07-02 LAB — CBC
Hemoglobin: 13.5 g/dL (ref 12.0–15.0)
MCHC: 32.3 g/dL (ref 30.0–36.0)
MCV: 85 fL (ref 78.0–100.0)
Platelets: 431 10*3/uL — ABNORMAL HIGH (ref 150–400)
RBC: 4.92 MIL/uL (ref 3.87–5.11)
WBC: 6.7 10*3/uL (ref 4.0–10.5)

## 2013-07-02 MED ORDER — OXYCODONE-ACETAMINOPHEN 5-325 MG PO TABS: 1.0000 | ORAL_TABLET | ORAL | Status: DC | PRN

## 2013-07-02 MED ORDER — SYNTHROID 100 MCG PO TABS: 100.0000 ug | ORAL_TABLET | Freq: Every day | ORAL | Status: DC

## 2013-07-02 NOTE — Telephone Encounter (Signed)
Pt notified that RX for synthroid has been sent to her pharmacy. RX for percocet is at front desk ready to be picked up. Pt will pick up rx prior surgery.

## 2013-07-02 NOTE — Progress Notes (Signed)
Patient request that her a prescription for Synthroid be forwarded to her pharmacy today. We will also give her a prescription for postoperative pain medication.  Velora Heckler, MD, [facility] Office: [phone]

## 2013-07-02 NOTE — Progress Notes (Signed)
Quick Note:  These results are acceptable for scheduled surgery.  Carsyn Boster M. Arrow Tomko, MD, FACS [facility] Office: [phone]   ______ 

## 2013-07-02 NOTE — Telephone Encounter (Signed)
Lab slip for po labs mailed to pt.

## 2013-07-02 NOTE — Progress Notes (Signed)
Quick Note:  These results are acceptable for scheduled surgery.  Whalen Trompeter M. Gerhard Rappaport, MD, FACS [facility] Office: [phone]   ______ 

## 2013-07-03 ENCOUNTER — Encounter (HOSPITAL_COMMUNITY): Payer: Self-pay | Admitting: *Deleted

## 2013-07-03 ENCOUNTER — Encounter (HOSPITAL_COMMUNITY)
Admission: RE | Disposition: A | Discharge status: Home / Self Care | Payer: Self-pay | Source: Ambulatory Visit | Attending: Surgery

## 2013-07-03 ENCOUNTER — Ambulatory Visit (HOSPITAL_COMMUNITY)
Admission: RE | Admit: 2013-07-03 | Discharge: 2013-07-04 | Disposition: A | Discharge status: Home / Self Care | Payer: 59 | Source: Ambulatory Visit | Attending: Surgery | Admitting: Surgery

## 2013-07-03 ENCOUNTER — Encounter (HOSPITAL_COMMUNITY): Payer: 59 | Admitting: Anesthesiology

## 2013-07-03 ENCOUNTER — Ambulatory Visit (HOSPITAL_COMMUNITY): Payer: 59 | Admitting: Anesthesiology

## 2013-07-03 DIAGNOSIS — R209 Unspecified disturbances of skin sensation: Secondary | ICD-10-CM | POA: Insufficient documentation

## 2013-07-03 DIAGNOSIS — K0381 Cracked tooth: Secondary | ICD-10-CM | POA: Insufficient documentation

## 2013-07-03 DIAGNOSIS — I4891 Unspecified atrial fibrillation: Secondary | ICD-10-CM | POA: Insufficient documentation

## 2013-07-03 DIAGNOSIS — C73 Malignant neoplasm of thyroid gland: Secondary | ICD-10-CM | POA: Insufficient documentation

## 2013-07-03 DIAGNOSIS — E042 Nontoxic multinodular goiter: Secondary | ICD-10-CM

## 2013-07-03 DIAGNOSIS — K219 Gastro-esophageal reflux disease without esophagitis: Secondary | ICD-10-CM | POA: Insufficient documentation

## 2013-07-03 DIAGNOSIS — E0520 Thyrotoxicosis with toxic multinodular goiter without thyrotoxic crisis or storm: Secondary | ICD-10-CM | POA: Diagnosis present

## 2013-07-03 DIAGNOSIS — Z79899 Other long term (current) drug therapy: Secondary | ICD-10-CM | POA: Insufficient documentation

## 2013-07-03 HISTORY — PX: THYROIDECTOMY: SHX17

## 2013-07-03 SURGERY — THYROIDECTOMY
Anesthesia: General | Site: Neck

## 2013-07-03 MED ORDER — 0.9 % SODIUM CHLORIDE (POUR BTL) OPTIME
TOPICAL | Status: DC | PRN
  Administered 2013-07-03: 1000 mL

## 2013-07-03 MED ORDER — ONDANSETRON HCL 4 MG/2ML IJ SOLN
INTRAMUSCULAR | Status: AC
  Filled 2013-07-03: qty 2

## 2013-07-03 MED ORDER — VANCOMYCIN HCL IN DEXTROSE 1-5 GM/200ML-% IV SOLN: 1000.0000 mg | INTRAVENOUS | Status: AC

## 2013-07-03 MED ORDER — NEOSTIGMINE METHYLSULFATE 1 MG/ML IJ SOLN
INTRAMUSCULAR | Status: DC | PRN
  Administered 2013-07-03: 5 mg via INTRAVENOUS

## 2013-07-03 MED ORDER — FENTANYL CITRATE 0.05 MG/ML IJ SOLN
INTRAMUSCULAR | Status: DC | PRN
  Administered 2013-07-03 (×5): 50 ug via INTRAVENOUS

## 2013-07-03 MED ORDER — CEFAZOLIN SODIUM-DEXTROSE 2-3 GM-% IV SOLR
INTRAVENOUS | Status: AC
  Filled 2013-07-03: qty 50

## 2013-07-03 MED ORDER — LIDOCAINE HCL (CARDIAC) 20 MG/ML IV SOLN
INTRAVENOUS | Status: AC
  Filled 2013-07-03: qty 5

## 2013-07-03 MED ORDER — PROPOFOL 10 MG/ML IV BOLUS
INTRAVENOUS | Status: DC | PRN
  Administered 2013-07-03: 150 mg via INTRAVENOUS

## 2013-07-03 MED ORDER — LACTATED RINGERS IV SOLN
INTRAVENOUS | Status: DC
  Administered 2013-07-03: 1000 mL via INTRAVENOUS
  Administered 2013-07-03: 16:00:00 via INTRAVENOUS

## 2013-07-03 MED ORDER — DEXAMETHASONE SODIUM PHOSPHATE 10 MG/ML IJ SOLN
INTRAMUSCULAR | Status: AC
  Filled 2013-07-03: qty 1

## 2013-07-03 MED ORDER — METOCLOPRAMIDE HCL 5 MG/ML IJ SOLN
INTRAMUSCULAR | Status: AC
  Filled 2013-07-03: qty 2

## 2013-07-03 MED ORDER — HYDROMORPHONE HCL PF 1 MG/ML IJ SOLN: 1.0000 mg | INTRAMUSCULAR | Status: DC | PRN

## 2013-07-03 MED ORDER — PHENYLEPHRINE 40 MCG/ML (10ML) SYRINGE FOR IV PUSH (FOR BLOOD PRESSURE SUPPORT)
PREFILLED_SYRINGE | INTRAVENOUS | Status: AC
  Filled 2013-07-03: qty 10

## 2013-07-03 MED ORDER — METOCLOPRAMIDE HCL 5 MG/ML IJ SOLN
INTRAMUSCULAR | Status: DC | PRN
  Administered 2013-07-03: 10 mg via INTRAVENOUS

## 2013-07-03 MED ORDER — EPHEDRINE SULFATE 50 MG/ML IJ SOLN
INTRAMUSCULAR | Status: DC | PRN
  Administered 2013-07-03: 5 mg via INTRAVENOUS

## 2013-07-03 MED ORDER — ONDANSETRON HCL 4 MG/2ML IJ SOLN
INTRAMUSCULAR | Status: DC | PRN
  Administered 2013-07-03: 4 mg via INTRAVENOUS

## 2013-07-03 MED ORDER — EPHEDRINE SULFATE 50 MG/ML IJ SOLN
INTRAMUSCULAR | Status: AC
  Filled 2013-07-03: qty 1

## 2013-07-03 MED ORDER — LACTATED RINGERS IV SOLN: INTRAVENOUS | Status: DC

## 2013-07-03 MED ORDER — ROCURONIUM BROMIDE 100 MG/10ML IV SOLN
INTRAVENOUS | Status: DC | PRN
  Administered 2013-07-03: 40 mg via INTRAVENOUS

## 2013-07-03 MED ORDER — ONDANSETRON HCL 4 MG PO TABS: 4.0000 mg | ORAL_TABLET | Freq: Four times a day (QID) | ORAL | Status: DC | PRN

## 2013-07-03 MED ORDER — PROPOFOL 10 MG/ML IV BOLUS
INTRAVENOUS | Status: AC
  Filled 2013-07-03: qty 20

## 2013-07-03 MED ORDER — ACETAMINOPHEN 325 MG PO TABS: 650.0000 mg | ORAL_TABLET | ORAL | Status: DC | PRN

## 2013-07-03 MED ORDER — MIDAZOLAM HCL 5 MG/5ML IJ SOLN
INTRAMUSCULAR | Status: DC | PRN
  Administered 2013-07-03: 2 mg via INTRAVENOUS

## 2013-07-03 MED ORDER — MIDAZOLAM HCL 2 MG/2ML IJ SOLN
INTRAMUSCULAR | Status: AC
  Filled 2013-07-03: qty 2

## 2013-07-03 MED ORDER — OXYCODONE-ACETAMINOPHEN 5-325 MG PO TABS
1.0000 | ORAL_TABLET | ORAL | Status: DC | PRN
  Administered 2013-07-04: 1 via ORAL
  Filled 2013-07-03: qty 1

## 2013-07-03 MED ORDER — ONDANSETRON HCL 4 MG/2ML IJ SOLN: 4.0000 mg | Freq: Four times a day (QID) | INTRAMUSCULAR | Status: DC | PRN

## 2013-07-03 MED ORDER — GLYCOPYRROLATE 0.2 MG/ML IJ SOLN
INTRAMUSCULAR | Status: DC | PRN
  Administered 2013-07-03: 0.6 mg via INTRAVENOUS

## 2013-07-03 MED ORDER — HYDROMORPHONE HCL PF 1 MG/ML IJ SOLN: 0.2500 mg | INTRAMUSCULAR | Status: DC | PRN

## 2013-07-03 MED ORDER — KCL IN DEXTROSE-NACL 20-5-0.45 MEQ/L-%-% IV SOLN
INTRAVENOUS | Status: DC
  Administered 2013-07-03: 20:00:00 via INTRAVENOUS
  Filled 2013-07-03 (×2): qty 1000

## 2013-07-03 MED ORDER — CALCIUM CARBONATE 1250 (500 CA) MG PO TABS
2.0000 | ORAL_TABLET | Freq: Three times a day (TID) | ORAL | Status: DC
  Administered 2013-07-04: 1000 mg via ORAL
  Filled 2013-07-03 (×5): qty 2

## 2013-07-03 MED ORDER — DEXAMETHASONE SODIUM PHOSPHATE 4 MG/ML IJ SOLN
INTRAMUSCULAR | Status: DC | PRN
  Administered 2013-07-03: 10 mg via INTRAVENOUS

## 2013-07-03 MED ORDER — PHENYLEPHRINE HCL 10 MG/ML IJ SOLN
INTRAMUSCULAR | Status: DC | PRN
  Administered 2013-07-03: 80 ug via INTRAVENOUS

## 2013-07-03 MED ORDER — FENTANYL CITRATE 0.05 MG/ML IJ SOLN
INTRAMUSCULAR | Status: AC
  Filled 2013-07-03: qty 5

## 2013-07-03 MED ORDER — CEFAZOLIN SODIUM-DEXTROSE 2-3 GM-% IV SOLR
2.0000 g | Freq: Once | INTRAVENOUS | Status: AC
  Administered 2013-07-03: 2 g via INTRAVENOUS

## 2013-07-03 SURGICAL SUPPLY — 38 items
APL SKNCLS STERI-STRIP NONHPOA (GAUZE/BANDAGES/DRESSINGS) ×1
ATTRACTOMAT 16X20 MAGNETIC DRP (DRAPES) ×2 IMPLANT
BENZOIN TINCTURE PRP APPL 2/3 (GAUZE/BANDAGES/DRESSINGS) ×2 IMPLANT
BLADE HEX COATED 2.75 (ELECTRODE) ×2 IMPLANT
BLADE SURG 15 STRL LF DISP TIS (BLADE) ×1 IMPLANT
BLADE SURG 15 STRL SS (BLADE) ×2
CANISTER SUCTION 2500CC (MISCELLANEOUS) ×2 IMPLANT
CHLORAPREP W/TINT 26ML (MISCELLANEOUS) ×2 IMPLANT
CLIP TI MEDIUM 6 (CLIP) ×4 IMPLANT
CLIP TI WIDE RED SMALL 6 (CLIP) ×6 IMPLANT
DISSECTOR ROUND CHERRY 3/8 STR (MISCELLANEOUS) IMPLANT
DRAPE PED LAPAROTOMY (DRAPES) ×2 IMPLANT
DRESSING SURGICEL FIBRLLR 1X2 (HEMOSTASIS) ×1 IMPLANT
DRSG SURGICEL FIBRILLAR 1X2 (HEMOSTASIS) ×2
ELECT REM PT RETURN 9FT ADLT (ELECTROSURGICAL) ×2
ELECTRODE REM PT RTRN 9FT ADLT (ELECTROSURGICAL) ×1 IMPLANT
GAUZE SPONGE 4X4 16PLY XRAY LF (GAUZE/BANDAGES/DRESSINGS) ×2 IMPLANT
GLOVE SURG ORTHO 8.0 STRL STRW (GLOVE) ×2 IMPLANT
GOWN STRL REIN XL XLG (GOWN DISPOSABLE) ×4 IMPLANT
KIT BASIN OR (CUSTOM PROCEDURE TRAY) ×2 IMPLANT
NS IRRIG 1000ML POUR BTL (IV SOLUTION) ×2 IMPLANT
PACK BASIC VI WITH GOWN DISP (CUSTOM PROCEDURE TRAY) ×2 IMPLANT
PENCIL BUTTON HOLSTER BLD 10FT (ELECTRODE) ×2 IMPLANT
SHEARS HARMONIC 9CM CVD (BLADE) ×2 IMPLANT
SPONGE GAUZE 4X4 12PLY (GAUZE/BANDAGES/DRESSINGS) ×1 IMPLANT
STAPLER VISISTAT 35W (STAPLE) IMPLANT
STRIP CLOSURE SKIN 1/2X4 (GAUZE/BANDAGES/DRESSINGS) ×2 IMPLANT
SUT MNCRL AB 4-0 PS2 18 (SUTURE) ×2 IMPLANT
SUT SILK 2 0 (SUTURE)
SUT SILK 2-0 18XBRD TIE 12 (SUTURE) IMPLANT
SUT SILK 3 0 (SUTURE)
SUT SILK 3-0 18XBRD TIE 12 (SUTURE) IMPLANT
SUT VIC AB 3-0 SH 18 (SUTURE) ×4 IMPLANT
SYR BULB IRRIGATION 50ML (SYRINGE) ×2 IMPLANT
TAPE CLOTH SURG 4X10 WHT LF (GAUZE/BANDAGES/DRESSINGS) ×1 IMPLANT
TOWEL OR 17X26 10 PK STRL BLUE (TOWEL DISPOSABLE) ×2 IMPLANT
TOWEL OR NON WOVEN STRL DISP B (DISPOSABLE) ×2 IMPLANT
YANKAUER SUCT BULB TIP 10FT TU (MISCELLANEOUS) ×2 IMPLANT

## 2013-07-03 NOTE — Anesthesia Preprocedure Evaluation (Addendum)
Anesthesia Evaluation  Patient identified by MRN, date of birth, ID band Patient awake    Reviewed: Allergy & Precautions, H&P , NPO status , Patient's Chart, lab work & pertinent test results  Airway Mallampati: II TM Distance: >3 FB Neck ROM: full    Dental no notable dental hx. (+) Teeth Intact and Dental Advisory Given Cracked lower front tooth:   Pulmonary neg pulmonary ROS,  breath sounds clear to auscultation  Pulmonary exam normal       Cardiovascular Exercise Tolerance: Good negative cardio ROS  + dysrhythmias Atrial Fibrillation Rhythm:regular Rate:Normal     Neuro/Psych Numbness left side of face around mouth negative neurological ROS  negative psych ROS   GI/Hepatic negative GI ROS, Neg liver ROS,   Endo/Other  negative endocrine ROSHyperthyroidism   Renal/GU negative Renal ROS  negative genitourinary   Musculoskeletal   Abdominal   Peds  Hematology negative hematology ROS (+)   Anesthesia Other Findings   Reproductive/Obstetrics negative OB ROS                          Anesthesia Physical Anesthesia Plan  ASA: III  Anesthesia Plan: General   Post-op Pain Management:    Induction: Intravenous  Airway Management Planned: Oral ETT  Additional Equipment:   Intra-op Plan:   Post-operative Plan: Extubation in OR  Informed Consent: I have reviewed the patients History and Physical, chart, labs and discussed the procedure including the risks, benefits and alternatives for the proposed anesthesia with the patient or authorized representative who has indicated his/her understanding and acceptance.   Dental Advisory Given  Plan Discussed with: CRNA and Surgeon  Anesthesia Plan Comments:         Anesthesia Quick Evaluation

## 2013-07-03 NOTE — Anesthesia Postprocedure Evaluation (Signed)
Anesthesia Post Note  Patient: Karen Wall  Procedure(s) Performed: Procedure(s) (LRB): TOTAL THYROIDECTOMY (N/A)  Anesthesia type: General  Patient location: PACU  Post pain: Pain level controlled  Post assessment: Post-op Vital signs reviewed  Last Vitals: BP 131/85  Pulse 73  Temp(Src) 36.9 C (Oral)  Resp 12  SpO2 100%  Post vital signs: Reviewed  Level of consciousness: sedated  Complications: No apparent anesthesia complications

## 2013-07-03 NOTE — Brief Op Note (Signed)
07/03/2013  4:46 PM  PATIENT:  Karen Wall  57 y.o. female  PRE-OPERATIVE DIAGNOSIS:  mulitinodular goiter and hyperthyroidism  POST-OPERATIVE DIAGNOSIS:  mulitinodular goiter and hyperthyroidism  PROCEDURE:  Procedure(s): TOTAL THYROIDECTOMY (N/A)  SURGEON:  Surgeon(s) and Role:    * Velora Heckler, MD - Primary    * Kandis Cocking, MD - Assisting  ANESTHESIA:   general  EBL:  Total I/O In: 1000 [I.V.:1000] Out: -   BLOOD ADMINISTERED:none  DRAINS: none   LOCAL MEDICATIONS USED:  NONE  SPECIMEN:  Excision  DISPOSITION OF SPECIMEN:  PATHOLOGY  COUNTS:  YES  TOURNIQUET:  * No tourniquets in log *  DICTATION: .Other Dictation: Dictation Number [id number]  PLAN OF CARE: Admit for overnight observation  PATIENT DISPOSITION:  PACU - hemodynamically stable.   Delay start of Pharmacological VTE agent (>24hrs) due to surgical blood loss or risk of bleeding: yes  Velora Heckler, MD, FACS General & Endocrine Surgery [facility] Office: [phone]

## 2013-07-03 NOTE — Interval H&P Note (Signed)
History and Physical Interval Note:  07/03/2013 3:08 PM  Karen Wall  has presented today for surgery, with the diagnosis of mulitinodular goiter and hyperthyroidism.  The various methods of treatment have been discussed with the patient and family. After consideration of risks, benefits and other options for treatment, the patient has consented to    Procedure(s): TOTAL THYROIDECTOMY (N/A) as a surgical intervention .    The patient's history has been reviewed, patient examined, no change in status, stable for surgery.  I have reviewed the patient's chart and labs.  Questions were answered to the patient's satisfaction.    Velora Heckler, MD, FACS General & Endocrine Surgery [facility] Office: [phone]   Karen Wall

## 2013-07-03 NOTE — H&P (View-Only) (Signed)
General Surgery [facility]  Chief Complaint  Patient presents with  . New Evaluation    multinodular goiter with hyperthyroidism - referral from Dr. Romero Belling, [place]    HISTORY: Patient is a 57 year old female referred by her endocrinologist for multinodular goiter with hyperthyroidism. Patient has been on methimazole since September 2014. Ultrasound shows an enlarged thyroid gland with multiple bilateral nodules. The largest nodule in the right measures 3.7 cm in the largest nodule on the left measures 3.0 cm. Fine-needle aspiration biopsy was performed and shows changes consistent with nonneoplastic goiter. Patient is currently taking Tapazole 20 mg daily.  TSH levels were markedly suppressed at 0.010.  On Tapazole, her current level is just over 10.  Patient has no prior history of head or neck surgery. She had never been on thyroid medication prior to September. There is no family history of thyroid disease and no family history of other endocrinopathy.  Past Medical History  Diagnosis Date  . GERD (gastroesophageal reflux disease)   . Thyroid disease     hyperthyroidism    Current Outpatient Prescriptions  Medication Sig Dispense Refill  . acetaminophen (TYLENOL) 500 MG tablet Take 500 mg by mouth every 6 (six) hours as needed for pain.      . cyclobenzaprine (FLEXERIL) 10 MG tablet Take 1 tablet (10 mg total) by mouth 2 (two) times daily as needed for muscle spasms.  30 tablet  0  . diphenhydrAMINE (BENADRYL) 25 MG tablet Take 25 mg by mouth every 6 (six) hours as needed.      . methimazole (TAPAZOLE) 10 MG tablet Take 10 mg by mouth daily.       No current facility-administered medications for this visit.    Allergies  Allergen Reactions  . Penicillins     Family History  Problem Relation Age of Onset  . Hypertension Mother   . Mental illness Father   . Mental illness Maternal Grandmother   . Stroke Maternal Grandfather     History    Social History  . Marital Status: Divorced    Spouse Name: N/A    Number of Children: N/A  . Years of Education: N/A   Social History Main Topics  . Smoking status: Never Smoker   . Smokeless tobacco: None  . Alcohol Use: No  . Drug Use: No  . Sexual Activity: None   Other Topics Concern  . None   Social History Narrative  . None    REVIEW OF SYSTEMS - PERTINENT POSITIVES ONLY: Denies tremors. Denies palpitations. Denies compressive symptoms. Denies palpable masses with the exception of the dominant nodule in the left thyroid lobe.  EXAM: Filed Vitals:   06/29/13 1510  BP: 118/76  Pulse: 68  Temp: 98.1 F (36.7 C)  Resp: 16    GENERAL: well-developed, well-nourished, no acute distress HEENT: normocephalic; pupils equal and reactive; sclerae clear; dentition good; mucous membranes moist NECK:  Palpable dominant nodule mid left thyroid lobe, approximately 3 cm, mobile, nontender; right thyroid lobe is firm without dominant mass, nontender; asymmetric on extension; no palpable anterior or posterior cervical lymphadenopathy; no supraclavicular masses; no tenderness CHEST: clear to auscultation bilaterally without rales, rhonchi, or wheezes CARDIAC: regular rate and rhythm without significant murmur; peripheral pulses are full EXT:  non-tender without edema; no deformity NEURO: no gross focal deficits; no sign of tremor   LABORATORY RESULTS: See [facility] (CHL-Epic) for most recent results  RADIOLOGY RESULTS: See [facility] (CHL-Epic) for most recent results  IMPRESSION: Toxic multinodular goiter, 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 She had discussed radioactive iodine treatment with her endocrinologist. Given the fact that she has large bilateral thyroid nodules and an element of tracheal deviation on chest x-ray, I believe she would be better served with total thyroidectomy for  management of multinodular goiter with hyperthyroidism. Patient and I discussed the risks benefits of the procedure including the potential for recurrent laryngeal nerve injury and injury to parathyroid glands. We discussed the hospital stay to be anticipated. We discussed the location of the surgical incision and its postoperative wound care. We discussed her recovery in time out of work. We discussed the need for lifelong thyroid hormone replacement. She understands and wishes to proceed in the near future.  The risks and benefits of the procedure have been discussed at length with the patient.  The patient understands the proposed procedure, potential alternative treatments, and the course of recovery to be expected.  All of the patient's questions have been answered at this time.  The patient wishes to proceed with surgery.  Velora Heckler, MD, FACS General & Endocrine Surgery [facility]  Primary Care Physician: Abbe Amsterdam, MD

## 2013-07-03 NOTE — Preoperative (Signed)
Beta Blockers   Reason not to administer Beta Blockers:Not Applicable, not on home BB 

## 2013-07-03 NOTE — Transfer of Care (Signed)
Immediate Anesthesia Transfer of Care Note  Patient: Karen Wall  Procedure(s) Performed: Procedure(s): TOTAL THYROIDECTOMY (N/A)  Patient Location: PACU  Anesthesia Type:General  Level of Consciousness: Patient easily awoken, sedated, comfortable, cooperative, following commands, responds to stimulation.   Airway & Oxygen Therapy: Patient spontaneously breathing, ventilating well, oxygen via simple oxygen mask.  Post-op Assessment: Report given to PACU RN, vital signs reviewed and stable, moving all extremities.   Post vital signs: Reviewed and stable.  Complications: No apparent anesthesia complications

## 2013-07-04 LAB — BASIC METABOLIC PANEL
BUN: 7 mg/dL (ref 6–23)
Calcium: 8.9 mg/dL (ref 8.4–10.5)
Chloride: 102 mEq/L (ref 96–112)
Creatinine, Ser: 0.53 mg/dL (ref 0.50–1.10)
GFR calc Af Amer: >90 mL/min (ref >90)
GFR calc non Af Amer: >90 mL/min (ref >90)
Glucose, Bld: 186 mg/dL — ABNORMAL HIGH (ref 70–99)
Potassium: 3.7 mEq/L (ref 3.5–5.1)

## 2013-07-04 NOTE — Op Note (Signed)
NAMEANJEL, PERFETTI NO.:  [id number]  MEDICAL RECORD NO.:  [id number]  LOCATION:  1529                         FACILITY[facility]  [facility]  PHYSICIAN:  Velora Heckler, MD      DATE OF BIRTH:  [birth year]  DATE OF PROCEDURE:  07/03/2013                              OPERATIVE REPORT   PREOPERATIVE DIAGNOSES:  Multinodular goiter with hyperthyroidism.  POSTOPERATIVE DIAGNOSES:  Multinodular goiter with hyperthyroidism.  PROCEDURE:  Total thyroidectomy.  SURGEON:  Velora Heckler, MD, FACS  ASSISTANT:  Ovidio Kin, MD, FACS  ANESTHESIA:  General per Dr. Ronelle Nigh.  ESTIMATED BLOOD LOSS:  Minimal.  PREPARATION:  ChloraPrep.  COMPLICATIONS:  None.  INDICATIONS:  The patient is a 57 year old female referred by her endocrinologist with multinodular thyroid goiter and hyperthyroidism. The patient had been placed on methimazole in September 2014. Ultrasound showed an enlarged thyroid gland containing multiple bilateral nodules.  The largest nodule on the right side measured 3.7 cm.  Fine-needle aspiration biopsy showed changes consistent with non- neoplastic goiter.  The patient now presents for total thyroidectomy for management of multiple thyroid nodules and hyperthyroidism.  BODY OF REPORT:  Procedure is done in OR #1 at the [facility].  The patient was brought to the operating room and placed in a supine position on the operating room table.  Following administration of general anesthesia, the patient was positioned and then prepped and draped in the usual aseptic fashion.  After ascertaining that an adequate level of anesthesia had been achieved, a Kocher incision was made with a #15 blade.  Dissection was carried through subcutaneous tissues and platysma was divided with the electrocautery.  Skin flaps were elevated cephalad and caudad from the thyroid notch to the sternal notch.  A Mahorner self-retaining retractor was placed for  exposure. Strap muscles were incised in the midline and dissection was begun on the left side of the neck.  Strap muscles were reflected laterally exposing a dominant nodule in the mid left thyroid lobe.  This measures approximately 3 cm in size.  It was gently mobilized with blunt dissection.  Venous tributaries were divided between Ligaclips with the Harmonic scalpel.  Superior pole vessels were dissected out individually and divided between small and medium Ligaclips with the Harmonic scalpel.  Gland was rolled anteriorly.  Inferior venous tributaries were divided between Ligaclips.  Branches of the inferior thyroid artery were dissected out and divided individually between small Ligaclips with the Harmonic scalpel.  Recurrent laryngeal nerve was identified and preserved.  Parathyroid tissue was identified and preserved.  Ligament of Allyson Sabal was released with the electrocautery and the gland was mobilized onto the anterior trachea.  Isthmus was mobilized across the midline.  There was no substantial pyramidal lobe identified.  Next, we turned our attention to the right thyroid lobe.  Right lobe appears to have a large cystic neoplasm in the inferior pole.  There was also a firm nodular density measuring approximately 1 cm in size in the mid portion of the gland.  Again venous tributaries were divided between Ligaclips with the Harmonic scalpel.  Superior pole vessels were dissected out individually and divided between small Ligaclips with the Harmonic scalpel.  Gland was rolled anteriorly.  Recurrent laryngeal nerve was identified and preserved.  Branches of the inferior thyroid artery were divided between small Ligaclips with the Harmonic scalpel. Ligament of Allyson Sabal was released with the electrocautery and the gland was mobilized onto the anterior trachea.  Inferior venous tributaries were divided between medium Ligaclips with the Harmonic scalpel.  Gland was completely excised off the  anterior trachea using the electrocautery for hemostasis.  Suture was used to mark the left superior pole.  The entire thyroid gland was submitted to Pathology for review.  Neck was irrigated with warm saline.  Neck was palpated bilaterally and no significant lymphadenopathy or other masses were identified. Fibrillar was placed throughout the operative field.  Strap muscles were reapproximated in the midline with interrupted 3-0 Vicryl sutures. Platysma was closed with interrupted 3-0 Vicryl sutures.  Skin was closed with a running 4-0 Monocryl subcuticular suture.  Wound was washed and dried and benzoin and Steri-Strips were applied.  Sterile dressings were applied.  The patient was awakened from anesthesia and brought to the recovery room.  The patient tolerated the procedure well.   Velora Heckler, MD, [facility] Office: [phone]    TMG/MEDQ  D:  07/03/2013  T:  07/04/2013  Job:  [id number]  cc:   Gregary Signs A. Everardo All, MD  Pearline Cables, MD Fax: [phone]

## 2013-07-04 NOTE — Progress Notes (Signed)
Patient ID: Karen Wall, female   DOB: December 22, 1955, 57 y.o.   MRN: [id number] 1 Day Post-Op  Subjective: No complaints this morning. Denies pain.  Objective: Vital signs in last 24 hours: Temp:  [97.7 F (36.5 C)-98.4 F (36.9 C)] 97.8 F (36.6 C) (12/20 0530) Pulse Rate:  [60-85] 73 (12/20 0530) Resp:  [12-20] 18 (12/20 0530) BP: (120-151)/(72-89) 120/72 mmHg (12/20 0530) SpO2:  [91 %-100 %] 94 % (12/20 0530) Weight:  [191 lb (86.637 kg)] 191 lb (86.637 kg) (12/19 1900) Last BM Date: 07/03/13  Intake/Output from previous day: 12/19 0701 - 12/20 0700 In: 2311.7 [P.O.:120; I.V.:2191.7] Out: 10 [Blood:10] Intake/Output this shift:    General appearance: alert, cooperative and no distress Incision/Wound: clean and dry without swelling or bruising  Lab Results:   Recent Labs  07/02/13 1016  WBC 6.7  HGB 13.5  HCT 41.8  PLT 431*   BMET  Recent Labs  07/02/13 1016 07/04/13 0538  NA 142 138  K 4.2 3.7  CL 104 102  CO2 29 26  GLUCOSE 92 186*  BUN 12 7  CREATININE 0.57 0.53  CALCIUM 9.4 8.9     Studies/Results: No results found.  Anti-infectives: Anti-infectives   Start     Dose/Rate Route Frequency Ordered Stop   07/03/13 1515  ceFAZolin (ANCEF) IVPB 2 g/50 mL premix     2 g 100 mL/hr over 30 Minutes Intravenous  Once 07/03/13 1501 07/03/13 1528   06/26/13 1300  vancomycin (VANCOCIN) IVPB 1000 mg/200 mL premix     1,000 mg 200 mL/hr over 60 Minutes Intravenous On call to O.R. 07/03/13 1253 06/27/13 0559      Assessment/Plan: s/p Procedure(s): TOTAL THYROIDECTOMY Doing well postoperatively. Ready for discharge. She has prescriptions for Synthroid and pain medication. Will take Tums 3 times per day.    LOS: 1 day    Delfin Squillace T 07/04/2013

## 2013-07-05 ENCOUNTER — Encounter: Payer: Self-pay | Admitting: Endocrinology

## 2013-07-06 ENCOUNTER — Encounter (HOSPITAL_COMMUNITY): Payer: Self-pay | Admitting: Surgery

## 2013-07-06 ENCOUNTER — Telehealth (INDEPENDENT_AMBULATORY_CARE_PROVIDER_SITE_OTHER): Payer: Self-pay | Admitting: *Deleted

## 2013-07-06 NOTE — Telephone Encounter (Signed)
I called pt to check on her postoperatively.  She states she is doing good just resting.  I informed her of her postop appt with Dr. Gerrit Friends on 07/29/13 with an arrival time of 10:45am.  I also made her aware that her lab slips were mailed out on 12/19 and she needs to have her labs drawn prior to appt on 07/29/13.  I instructed her to call our office with any questions or concerns.  She is agreeable with this plan.

## 2013-07-13 NOTE — Progress Notes (Signed)
Quick Note:  Called patient with pathology results. 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 Negative margins. Probably a low risk lesion.  Will need Dr. George Hugh input as to whether radioactive iodine is needed post op.  Patient doing well post op.  Velora Heckler, MD, [facility] Office: [phone]   ______

## 2013-07-15 ENCOUNTER — Ambulatory Visit (INDEPENDENT_AMBULATORY_CARE_PROVIDER_SITE_OTHER): Payer: 59 | Admitting: Surgery

## 2013-07-24 ENCOUNTER — Ambulatory Visit (INDEPENDENT_AMBULATORY_CARE_PROVIDER_SITE_OTHER): Payer: 59 | Admitting: Endocrinology

## 2013-07-24 ENCOUNTER — Encounter: Payer: Self-pay | Admitting: Endocrinology

## 2013-07-24 VITALS — BP 120/74 | HR 84 | Temp 98.3°F | Ht 64.0 in | Wt 190.0 lb

## 2013-07-24 DIAGNOSIS — E890 Postprocedural hypothyroidism: Secondary | ICD-10-CM

## 2013-07-24 DIAGNOSIS — R252 Cramp and spasm: Secondary | ICD-10-CM

## 2013-07-24 DIAGNOSIS — E0520 Thyrotoxicosis with toxic multinodular goiter without thyrotoxic crisis or storm: Secondary | ICD-10-CM

## 2013-07-24 HISTORY — DX: Postprocedural hypothyroidism: E89.0

## 2013-07-24 NOTE — Patient Instructions (Addendum)
blood tests are being requested for you today.  We'll contact you with results. No treatment is needed for the thyroid cancer now, as it was tiny.   Please come back for a follow-up appointment in 1 year.

## 2013-07-24 NOTE — Progress Notes (Signed)
   Subjective:    Patient ID: Karen Wall, female    DOB: July 21, 1955, 58 y.o.   MRN: [id number]  HPI In September of 2014, pt was dx'ed with hyperthyroidism due to a multinodular goiter.  She never had XRT.  Bx was benign.  She chose tapazole for initial rx, but then had thyroidectomy in December of 2014.  pathol showed PAPILLARY THYROID CARCINOMA, FOLLICULAR VARIANT, 0.8 CM, RIGHT LOBE.  She slight leg cramps, and assoc fatigue.   Past Medical History  Diagnosis Date  . GERD (gastroesophageal reflux disease)   . Thyroid disease     hyperthyroidism  . Dysrhythmia     a -fib due to thyroid problems - resolved with thyroid med  . Numbness     Left side of face around mouth - unknown etiology  . H/O hiatal hernia     "very small "  . Hyperthyroidism     Past Surgical History  Procedure Laterality Date  . Cholecystectomy  1992  . Breast biopsy    . Thyroidectomy N/A 07/03/2013    Procedure: TOTAL THYROIDECTOMY;  Surgeon: Earnstine Regal, MD;  Location: [facility];  Service: General;  Laterality: N/A;    History   Social History  . Marital Status: Divorced    Spouse Name: N/A    Number of Children: N/A  . Years of Education: N/A   Occupational History  . Not on file.   Social History Main Topics  . Smoking status: Never Smoker   . Smokeless tobacco: Not on file  . Alcohol Use: No  . Drug Use: No  . Sexual Activity: Not on file   Other Topics Concern  . Not on file   Social History Narrative  . No narrative on file    Current Outpatient Prescriptions on File Prior to Visit  Medication Sig Dispense Refill  . acetaminophen (TYLENOL) 500 MG tablet Take 500 mg by mouth every 6 (six) hours as needed for pain.      . diphenhydrAMINE (BENADRYL) 25 MG tablet Take 25 mg by mouth at bedtime as needed for sleep.       Marland Kitchen SYNTHROID 100 MCG tablet Take 1 tablet (100 mcg total) by mouth daily.  30 tablet  3   No current facility-administered medications on file prior to visit.      Allergies  Allergen Reactions  . Penicillins     unknown    Family History  Problem Relation Age of Onset  . Hypertension Mother   . Mental illness Father   . Mental illness Maternal Grandmother   . Stroke Maternal Grandfather     BP 120/74  Pulse 84  Temp(Src) 98.3 F (36.8 C) (Oral)  Ht 5\' 4"  (1.626 m)  Wt 190 lb (86.183 kg)  BMI 32.60 kg/m2  SpO2 98%  Review of Systems Denies weight change.  Neck pain is much better    Objective:   Physical Exam VITAL SIGNS:  See vs page GENERAL: no distress Ext: no edema      Assessment & Plan:  Small focus of differentiated thyroid cancer, new dx.  She does not need adjuvant i-131 rx.  She can have f/u US in 1 year. Postsurgical hypothyroidism.  She may need increased rx. Leg cramps, but Ca++ levels have been normal.

## 2013-07-29 ENCOUNTER — Encounter (INDEPENDENT_AMBULATORY_CARE_PROVIDER_SITE_OTHER): Payer: Self-pay | Admitting: Surgery

## 2013-07-29 ENCOUNTER — Ambulatory Visit (INDEPENDENT_AMBULATORY_CARE_PROVIDER_SITE_OTHER): Payer: Commercial Managed Care - PPO | Admitting: Surgery

## 2013-07-29 ENCOUNTER — Encounter (INDEPENDENT_AMBULATORY_CARE_PROVIDER_SITE_OTHER): Payer: Self-pay

## 2013-07-29 VITALS — BP 122/74 | HR 100 | Temp 98.2°F | Resp 18 | Ht 64.0 in | Wt 193.0 lb

## 2013-07-29 DIAGNOSIS — E890 Postprocedural hypothyroidism: Secondary | ICD-10-CM

## 2013-07-29 DIAGNOSIS — E0520 Thyrotoxicosis with toxic multinodular goiter without thyrotoxic crisis or storm: Secondary | ICD-10-CM

## 2013-07-29 NOTE — Progress Notes (Signed)
General Surgery [facility]  Chief Complaint  Patient presents with  . Routine Post Op    total thyroidectomy 07/03/2013    HISTORY: The patient is a 58 year old female who underwent total thyroidectomy on 07/03/2013. Final pathology showed a multinodular thyroid goiter. 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 Patient has been evaluated by her endocrinologist. No further treatment is planned. Radioactive iodine treatment is not indicated.  Patient is taking Synthroid 100 mcg daily. Laboratory evaluation is pending by her endocrinologist.  EXAM: Surgical incision is healing nicely. Minimal soft tissue swelling. No sign of seroma. No sign of infection. Voice quality is normal.  IMPRESSION: #1 follicular variant of papillary thyroid carcinoma, 8 mm, negative surgical margins #2 multinodular goiter, status post total thyroidectomy #3 post surgical hypothyroidism  PLAN: Patient will continue followup with her endocrinologist who will adjust her thyroid hormone replacement dosage as needed. Patient may discontinue calcium supplementation at this time.  Patient will begin applying topical creams to her incision. She will return in 6 weeks for wound check.  Earnstine Regal, MD, [facility]   Visit Diagnoses: 1. Postsurgical hypothyroidism   2. Toxic multinodular goiter

## 2013-07-29 NOTE — Patient Instructions (Signed)
  COCOA BUTTER & VITAMIN E CREAM  ([employer] or other brand)  Apply cocoa butter/vitamin E cream to your incision 2 - 3 times daily.  Massage cream into incision for one minute with each application.  Use sunscreen (50 SPF or higher) for first 6 months after surgery if area is exposed to sun.  You may substitute Mederma or other scar reducing creams as desired.   

## 2013-07-31 ENCOUNTER — Telehealth: Payer: Self-pay | Admitting: Endocrinology

## 2013-07-31 ENCOUNTER — Encounter: Payer: Self-pay | Admitting: Endocrinology

## 2013-07-31 NOTE — Telephone Encounter (Signed)
Please call lab.  We need 07/24/13 results.

## 2013-08-03 NOTE — Telephone Encounter (Signed)
Contacted lab. Labs faxed to office. Labs placed on desk for review.

## 2013-08-04 ENCOUNTER — Telehealth: Payer: Self-pay | Admitting: Endocrinology

## 2013-08-04 NOTE — Telephone Encounter (Signed)
Called pt and instructed to call back to discuss labs.

## 2013-08-04 NOTE — Telephone Encounter (Signed)
please call patient: i have received blood test result This suggests that thyroid medication is too much.  However, we should repeat before adjusting medication. Please come in to the lab, here in the office.

## 2013-08-06 ENCOUNTER — Encounter: Payer: Self-pay | Admitting: Endocrinology

## 2013-08-06 ENCOUNTER — Other Ambulatory Visit: Payer: Self-pay

## 2013-08-06 ENCOUNTER — Other Ambulatory Visit: Payer: 59

## 2013-08-06 ENCOUNTER — Other Ambulatory Visit: Payer: Self-pay | Admitting: Endocrinology

## 2013-08-06 DIAGNOSIS — E890 Postprocedural hypothyroidism: Secondary | ICD-10-CM

## 2013-08-06 DIAGNOSIS — E0520 Thyrotoxicosis with toxic multinodular goiter without thyrotoxic crisis or storm: Secondary | ICD-10-CM

## 2013-08-06 LAB — BASIC METABOLIC PANEL WITH GFR
BUN: 15 mg/dL (ref 6–23)
CHLORIDE: 104 meq/L (ref 96–112)
CO2: 27 mEq/L (ref 19–32)
Calcium: 8.9 mg/dL (ref 8.4–10.5)
Creat: 0.54 mg/dL (ref 0.50–1.10)
GFR, Est African American: >89 mL/min
GFR, Est Non African American: >89 mL/min
GLUCOSE: 84 mg/dL (ref 70–99)
POTASSIUM: 4.7 meq/L (ref 3.5–5.3)
Sodium: 140 mEq/L (ref 135–145)

## 2013-08-06 LAB — TSH: TSH: 0.074 u[IU]/mL — ABNORMAL LOW (ref 0.350–4.500)

## 2013-08-06 NOTE — Telephone Encounter (Signed)
Pt coming in at 43 for labs.

## 2013-08-07 ENCOUNTER — Other Ambulatory Visit: Payer: Self-pay | Admitting: Endocrinology

## 2013-08-07 MED ORDER — LEVOTHYROXINE SODIUM 50 MCG PO TABS: 50.0000 ug | ORAL_TABLET | Freq: Every day | ORAL | Status: DC

## 2013-08-24 ENCOUNTER — Encounter: Payer: Self-pay | Admitting: Family Medicine

## 2013-08-24 ENCOUNTER — Ambulatory Visit (INDEPENDENT_AMBULATORY_CARE_PROVIDER_SITE_OTHER): Payer: 59 | Admitting: Family Medicine

## 2013-08-24 VITALS — BP 130/80 | HR 76 | Temp 98.2°F | Resp 16 | Ht 62.5 in | Wt 195.0 lb

## 2013-08-24 DIAGNOSIS — L659 Nonscarring hair loss, unspecified: Secondary | ICD-10-CM

## 2013-08-24 DIAGNOSIS — C73 Malignant neoplasm of thyroid gland: Secondary | ICD-10-CM

## 2013-08-24 NOTE — Progress Notes (Signed)
[facility] [address] [place] [address] [phone]  Date:  08/24/2013   Name:  Karen Wall   DOB:  [birth year]   MRN:  [id number]  PCP:  Lamar Blinks, MD    Chief Complaint: Follow-up   History of Present Illness:  Karen Wall is a 58 y.o. very pleasant female patient who presents with the following:  She is here today to discuss her recent thyroid issues.  She had a total thyroidectomy in mid -December due to a MNG.  She did have a small cancer, which was confined to the thyroid gland.  She had a 52mm follicular variant of papillary thyroid carcinoma in the right lobe.  Negative margins  Karen Wall is still having trouble getting her level of thyroid medication right. She is experiencing mood swings and fatigue, crying.  She feels this is due to still getting her hormone level correct, but is is stressful for her.  She is otherwise back and work and doing well form her operation.    Although she does trust Dr. Loanne Drilling she has done some research online and is not sure if she might need further treatment or evaluation for her cancer.  She would like to have a second opinion just to set her mind at ease.  Also, she is upset about some diffuse hair loss.  When she washes her hair she will lose a lot of strands and notes taht she is looking a little thin throughout her scalp.  This has been going on for a couple of months.  She does not have any bald patches- the loss is throughout the hair.   Patient Active Problem List   Diagnosis Date Noted  . Postsurgical hypothyroidism 07/24/2013  . Toxic multinodular goiter 07/03/2013    Past Medical History  Diagnosis Date  . GERD (gastroesophageal reflux disease)   . Thyroid disease     hyperthyroidism  . Dysrhythmia     a -fib due to thyroid problems - resolved with thyroid med  . Numbness     Left side of face around mouth - unknown etiology  . H/O hiatal hernia     "very small "  .  Hyperthyroidism     Past Surgical History  Procedure Laterality Date  . Cholecystectomy  1992  . Breast biopsy    . Thyroidectomy N/A 07/03/2013    Procedure: TOTAL THYROIDECTOMY;  Surgeon: Earnstine Regal, MD;  Location: [facility];  Service: General;  Laterality: N/A;    History  Substance Use Topics  . Smoking status: Never Smoker   . Smokeless tobacco: Not on file  . Alcohol Use: No    Family History  Problem Relation Age of Onset  . Hypertension Mother   . Mental illness Father   . Mental illness Maternal Grandmother   . Stroke Maternal Grandfather     Allergies  Allergen Reactions  . Penicillins     unknown    Medication list has been reviewed and updated.  Current Outpatient Prescriptions on File Prior to Visit  Medication Sig Dispense Refill  . acetaminophen (TYLENOL) 500 MG tablet Take 500 mg by mouth every 6 (six) hours as needed for pain.      . diphenhydrAMINE (BENADRYL) 25 MG tablet Take 25 mg by mouth at bedtime as needed for sleep.       Marland Kitchen levothyroxine (SYNTHROID, LEVOTHROID) 50 MCG tablet Take 1 tablet (50 mcg total) by mouth daily before breakfast.  30 tablet  3   No current facility-administered medications on file prior to visit.    Review of Systems:  As per HPI- otherwise negative.   Physical Examination: Filed Vitals:   08/24/13 1012  BP: 130/80  Pulse: 76  Temp: 98.2 F (36.8 C)  Resp: 16   Filed Vitals:   08/24/13 1012  Height: 5' 2.5" (1.588 m)  Weight: 195 lb (88.451 kg)   Body mass index is 35.08 kg/(m^2). Ideal Body Weight: Weight in (lb) to have BMI = 25: 138.6  GEN: WDWN, NAD, Non-toxic, A & O x 3, looks well HEENT: Atraumatic, Normocephalic. Neck supple. No masses, No LAD.   Ears and Nose: No external deformity. CV: RRR, No M/G/R. No JVD. No thrill. No extra heart sounds. PULM: CTA B, no wheezes, crackles, rhonchi. No retractions. No resp. distress. No accessory muscle use. ABD: S, NT, ND, +BS. No rebound. No HSM. EXTR:  No c/c/e NEURO Normal gait.  PSYCH: Normally interactive. Conversant. Not depressed or anxious appearing.  Calm demeanor.  Well- healed thyroidectomy scar.   Did some hair pulls and brought out just a few hairs (about 5) with bulbs attaches.  Her hair is minimally thin at the crown.  No patches noted  Assessment and Plan: Thyroid cancer - Plan: Ambulatory referral to Hematology  Hair loss  Reassured that her hair will likely return in the next few months.  If not she will let me know.  Reassured that seeking a second opinion is ok, and we should do this if she feels worried.  Will refer to heme- onc to review her case.    Signed Lamar Blinks, MD

## 2013-08-24 NOTE — Patient Instructions (Signed)
You most likely have telogen effluvium (stress related temporarily hair loss) or hair loss related to your temporary hypothyroidism.  In either case, I do suspect that your hair will likely come back in the next few months.    I will refer you to [facility] hematology oncology to further discuss your options regarding your treatment.

## 2013-08-29 ENCOUNTER — Encounter: Payer: Self-pay | Admitting: Endocrinology

## 2013-08-31 ENCOUNTER — Other Ambulatory Visit: Payer: Self-pay | Admitting: Endocrinology

## 2013-09-01 ENCOUNTER — Other Ambulatory Visit: Payer: Self-pay | Admitting: Endocrinology

## 2013-09-01 LAB — TSH: TSH: 14.627 u[IU]/mL — ABNORMAL HIGH (ref 0.350–4.500)

## 2013-09-01 MED ORDER — LEVOTHYROXINE SODIUM 75 MCG PO TABS: 75.0000 ug | ORAL_TABLET | Freq: Every day | ORAL | Status: DC

## 2013-09-09 ENCOUNTER — Ambulatory Visit (INDEPENDENT_AMBULATORY_CARE_PROVIDER_SITE_OTHER): Payer: Commercial Managed Care - PPO | Admitting: Surgery

## 2013-09-09 ENCOUNTER — Encounter (INDEPENDENT_AMBULATORY_CARE_PROVIDER_SITE_OTHER): Payer: Self-pay | Admitting: Surgery

## 2013-09-09 VITALS — BP 124/80 | HR 76 | Temp 98.2°F | Resp 14 | Ht 64.0 in | Wt 198.6 lb

## 2013-09-09 DIAGNOSIS — C73 Malignant neoplasm of thyroid gland: Secondary | ICD-10-CM

## 2013-09-09 DIAGNOSIS — E890 Postprocedural hypothyroidism: Secondary | ICD-10-CM

## 2013-09-09 NOTE — Progress Notes (Signed)
General Surgery [facility]  Chief Complaint  Patient presents with  . 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 She is currently taking Synthroid 75 mcg daily, down from her prior dosage of 100 mcg daily. A recent TSH level is now elevated at 14. This is not sufficient suppression given her diagnosis. She has laboratories that he is scheduled and then plans to followup with her endocrinologist.  Patient is having problems with significant mood swings. She has noted hair loss. She has noted weight gain.  EXAM: Surgical incision is nicely healed with good cosmetic result. No sign of infection. Voice quality is essentially normal at conversational level.  IMPRESSION: #1 status post total thyroidectomy with iatrogenic hypothyroidism #2 history of follicular variant of papillary thyroid carcinoma  PLAN: Patient is scheduled to see Dr. Burney Gauze for a second opinion regarding her thyroid carcinoma and its management. I would recommend that she see her current endocrinologist's partner, Dr. Philemon Kingdom for a second opinion and ongoing management.  I will make this suggestion to Dr. Lorelei Pont and to the patient.  Patient will return for surgical care and assessment in 4 months.  Earnstine Regal, MD, [facility]   Visit Diagnoses: 1. Thyroid cancer, follicular variant of papillary carcinoma   2. Postsurgical hypothyroidism

## 2013-09-09 NOTE — Patient Instructions (Signed)

## 2013-09-28 ENCOUNTER — Ambulatory Visit: Payer: 59

## 2013-09-28 ENCOUNTER — Ambulatory Visit: Payer: 59 | Admitting: Hematology & Oncology

## 2013-09-28 ENCOUNTER — Other Ambulatory Visit: Payer: 59 | Admitting: Lab

## 2013-09-30 ENCOUNTER — Encounter: Payer: Self-pay | Admitting: Family Medicine

## 2013-09-30 DIAGNOSIS — G4700 Insomnia, unspecified: Secondary | ICD-10-CM

## 2013-10-01 MED ORDER — LORAZEPAM 1 MG PO TABS: ORAL_TABLET | ORAL | Status: DC

## 2013-10-05 ENCOUNTER — Ambulatory Visit (INDEPENDENT_AMBULATORY_CARE_PROVIDER_SITE_OTHER): Payer: 59 | Admitting: Internal Medicine

## 2013-10-05 ENCOUNTER — Encounter: Payer: Self-pay | Admitting: Internal Medicine

## 2013-10-05 VITALS — BP 118/68 | HR 61 | Temp 98.1°F | Resp 12 | Wt 195.0 lb

## 2013-10-05 DIAGNOSIS — C73 Malignant neoplasm of thyroid gland: Secondary | ICD-10-CM

## 2013-10-05 DIAGNOSIS — E890 Postprocedural hypothyroidism: Secondary | ICD-10-CM

## 2013-10-05 LAB — T4, FREE: FREE T4: 0.93 ng/dL (ref 0.60–1.60)

## 2013-10-05 LAB — TSH: TSH: 1.42 u[IU]/mL (ref 0.35–5.50)

## 2013-10-05 MED ORDER — SYNTHROID 75 MCG PO TABS: 75.0000 ug | ORAL_TABLET | Freq: Every day | ORAL | Status: DC

## 2013-10-05 NOTE — Patient Instructions (Signed)
Please return in 4 months. Please stop at the lab. 

## 2013-10-05 NOTE — Progress Notes (Signed)
Patient ID: Karen Wall, female   DOB: 05-Mar-1956, 58 y.o.   MRN: [id number]   HPI  Karen Wall is a 58 y.o.-year-old female, self-referred for evaluation for thyroid cancer - follicular variant of PTC and postsurgical hypothyroidism. She is here for a second opinion - she previously saw Dr Loanne Drilling.  In 03/2013, she developed fevers >> TSH checked >> hyperthyroidism (TMNG) >> started MMI >> continued for 1 month. She also had a thyroid U/S in 03/2013 >> 2 thyroid nodules >> FNA of each in 04/2013 was negative. However, she opted for thyroidectomy for the TMNG >> total thyroidectomy >> one focus of follicular variant of PTC of 0.8 cm. Dr Loanne Drilling advised against RAI Tx and against following Thyroglobulin. She would like to get a second opinion about these issues.  I reviewed pt's thyroid tests: Lab Results  Component Value Date   TSH 14.627* 08/31/2013   TSH 0.074* 08/06/2013   TSH 10.64* 06/23/2013   TSH 0.014* 05/12/2013   TSH 0.010* 04/09/2013   FREET4 0.37* 06/23/2013   FREET4 0.80 05/12/2013   FREET4 1.54 04/09/2013  Started on 100 mcg Synthroid after sx >> TSH 0.074 >> dose decrease to 50 mcg >> pt felt terrible >> TSH 14.6 >> dose increased to 75 mcg, which she continues today.  Pt denies feeling nodules in neck, hoarseness, dysphagia/odynophagia, SOB with lying down.  She is Synthroid - brand name 75 mcg daily. - in am, at 5 am - with water - eats b'fast after 7 am - takes a MVI at lunch  Pt c/o: - no heat intolerance/cold intolerance - no tremors - still has palpitations - no anxiety/+ depression (improved) - no hyperdefecation/constipation - no weight loss - + weight gain (last summer: 160 >> 190s) - no dry skin - + hair falling - + problems with concentration - + fatigue  Pt does not have a FH of thyroid ds. No FH of thyroid cancer. No h/o radiation tx to head or neck.  ROS: Constitutional: see HPI Eyes: no blurry vision, no xerophthalmia ENT: no sore  throat, no nodules palpated in throat, no dysphagia/odynophagia, + hoarseness occasionally Cardiovascular: no CP/SOB/+ palpitations/leg swelling Respiratory: no cough/SOB Gastrointestinal: no N/V/D/C Musculoskeletal: no muscle/joint aches Skin: no rashes Neurological: no tremors/numbness/tingling/dizziness, + HA Psychiatric: no depression/anxiety  Past Medical History  Diagnosis Date  . GERD (gastroesophageal reflux disease)   . Thyroid disease     hyperthyroidism  . Dysrhythmia     a -fib due to thyroid problems - resolved with thyroid med  . Numbness     Left side of face around mouth - unknown etiology  . H/O hiatal hernia     "very small "  . Hyperthyroidism    Past Surgical History  Procedure Laterality Date  . Cholecystectomy  1992  . Breast biopsy    . Thyroidectomy N/A 07/03/2013    Procedure: TOTAL THYROIDECTOMY;  Surgeon: Earnstine Regal, MD;  Location: [facility];  Service: General;  Laterality: N/A;   History   Social History  . Marital Status: Divorced    Spouse Name: N/A    Number of Children: N/A  . Years of Education: N/A   Occupational History  . [employer]   Social History Main Topics  . Smoking status: Never Smoker   . Smokeless tobacco: Never Used  . Alcohol Use: No  . Drug Use: No   Current Outpatient Prescriptions on File Prior to Visit  Medication Sig Dispense Refill  .  acetaminophen (TYLENOL) 500 MG tablet Take 500 mg by mouth every 6 (six) hours as needed for pain.      . diphenhydrAMINE (BENADRYL) 25 MG tablet Take 25 mg by mouth at bedtime as needed for sleep.       . IBUPROFEN PO Take by mouth.      Marland Kitchen LORazepam (ATIVAN) 1 MG tablet Take 1 at bedtime as needed for insomnia.  May take 2 if needed.  30 tablet  0   No current facility-administered medications on file prior to visit.   Allergies  Allergen Reactions  . Penicillins     unknown   Family History  Problem Relation Age of Onset  . Hypertension Mother   . Mental illness Father    . Mental illness Maternal Grandmother   . Stroke Maternal Grandfather    PE: BP 118/68  Pulse 61  Temp(Src) 98.1 F (36.7 C) (Oral)  Resp 12  Wt 195 lb (88.451 kg)  SpO2 97% Wt Readings from Last 3 Encounters:  10/05/13 195 lb (88.451 kg)  09/09/13 198 lb 9.6 oz (90.084 kg)  08/24/13 195 lb (88.451 kg)   Constitutional: overweight, in NAD Eyes: PERRLA, EOMI, no exophthalmos ENT: moist mucous membranes, cervical scar healing nicely - no dysesthesia, no cervical lymphadenopathy Cardiovascular: RRR, No MRG Respiratory: CTA B Gastrointestinal: abdomen soft, NT, ND, BS+ Musculoskeletal: no deformities, strength intact in all 4;  Skin: moist, warm, no rashes Neurological: no tremor with outstretched hands, DTR normal in all 4  ASSESSMENT: 1. Thyroid cancer - Papillary Thy Ca, follicular variant   2. Postsurgical hypothyroidism  PLAN: 1. PTC - subcm, w/o extension to capsule, lymph or blood vessel, and w/o neck extension - s/p total thyroidectomy in 06/2013 - explained why RAI tx not recommended for such a low risk, small, cancer focus - also explained why Thyroglobulin check is not indicated (cannot differentiate b/w healthy and cancerous thyroid tissue w/o prior RAI ablation) - we discussed about following her with thyroid U/S - initially q year x 2, then less often - she agrees to plan  2. Post-surgical hypothyroidism - on Synthroid brand name now, 75 mcg daily - advised pt to take Levothyroxine EVERY DAY, with water, >30 min before b'fast, separated by >4h from anti acid medication, calcium, iron, MVI. - check TFTs today - we will target a low normal range of TSH  I will see pt back in 4 mo, bt will likely need labs until then. We will stay connected through [facility].  Office Visit on 10/05/2013  Component Date Value Ref Range Status  . TSH 10/05/2013 1.42  0.35 - 5.50 uIU/mL Final  . Free T4 10/05/2013 0.93  0.60 - 1.60 ng/dL Final   Continue current Synthroid  dose. Will repeat the tests when she comes back in 4 mo.

## 2013-10-06 ENCOUNTER — Encounter: Payer: Self-pay | Admitting: Family Medicine

## 2013-10-06 ENCOUNTER — Encounter: Payer: Self-pay | Admitting: Internal Medicine

## 2013-12-10 ENCOUNTER — Telehealth: Payer: Self-pay | Admitting: *Deleted

## 2013-12-10 ENCOUNTER — Other Ambulatory Visit: Payer: Self-pay | Admitting: Endocrinology

## 2013-12-10 MED ORDER — SYNTHROID 75 MCG PO TABS: 75.0000 ug | ORAL_TABLET | Freq: Every day | ORAL | Status: DC

## 2013-12-10 NOTE — Telephone Encounter (Signed)
Rx refill sent.

## 2014-01-19 ENCOUNTER — Encounter: Payer: Self-pay | Admitting: Internal Medicine

## 2014-01-19 ENCOUNTER — Ambulatory Visit (INDEPENDENT_AMBULATORY_CARE_PROVIDER_SITE_OTHER): Payer: 59 | Admitting: Internal Medicine

## 2014-01-19 VITALS — BP 110/62 | HR 75 | Temp 98.4°F | Resp 12 | Wt 201.0 lb

## 2014-01-19 DIAGNOSIS — E890 Postprocedural hypothyroidism: Secondary | ICD-10-CM

## 2014-01-19 DIAGNOSIS — C73 Malignant neoplasm of thyroid gland: Secondary | ICD-10-CM

## 2014-01-19 NOTE — Progress Notes (Signed)
Patient ID: Karen Wall, female   DOB: 1956-02-14, 58 y.o.   MRN: [id number]   HPI  Karen Wall is a 59 y.o.-year-old female, returning for f/u for thyroid cancer - follicular variant of PTC and postsurgical hypothyroidism. Last visit 3.5 mo ago.  Reviewed hx: In 03/2013, she developed fevers >> TSH checked >> hyperthyroidism (TMNG) >> started MMI >> continued for 1 month. She also had a thyroid U/S in 03/2013 >> 2 thyroid nodules >> FNA of each in 04/2013 was negative. However, she opted for thyroidectomy for the TMNG >> total thyroidectomy >> one focus of follicular variant of PTC of 0.8 cm. Dr Loanne Drilling advised against RAI Tx and against following Thyroglobulin. She would like to get a second opinion about these issues.  I reviewed pt's thyroid tests: Lab Results  Component Value Date   TSH 1.42 10/05/2013   TSH 14.627* 08/31/2013   TSH 0.074* 08/06/2013   TSH 10.64* 06/23/2013   TSH 0.014* 05/12/2013   TSH 0.010* 04/09/2013   FREET4 0.93 10/05/2013   FREET4 0.37* 06/23/2013   FREET4 0.80 05/12/2013   FREET4 1.54 04/09/2013   Started on 100 mcg Synthroid  - brand nameafter sx >> TSH 0.074 >> dose decrease to 50 mcg >> pt felt terrible >> TSH 14.6 >> dose increased to 75 mcg, which she continues today as her last TFTs were normal: - in am, at 5 am - with water - eats b'fast after 7 am - takes a MVI at lunch  Pt denies feeling nodules in neck, hoarseness, dysphagia/odynophagia, SOB with lying down.  Pt c/o: - ++ fatigue - after the surgery - no heat intolerance/cold intolerance - no tremors - no palpitations - no hyperdefecation/constipation - no weight loss - + weight gain (last summer: 160 >> 190s >> 201 lbs) - no dry skin - no hair falling  I reviewed pt's medications, allergies, PMH, social hx, family hx and no changes required, except as mentioned above.  ROS: Constitutional: see HPI Eyes: no blurry vision, no xerophthalmia ENT: no sore throat, no nodules  palpated in throat, no dysphagia/odynophagia, no hoarseness Cardiovascular: no CP/SOB/palpitations/leg swelling Respiratory: no cough/SOB Gastrointestinal: no N/V/D/C Musculoskeletal: no muscle/+ joint pain/+ mm twitching Skin: no rashes Neurological: no tremors/numbness/tingling/dizziness  PE: BP 110/62  Pulse 75  Temp(Src) 98.4 F (36.9 C) (Oral)  Resp 12  Wt 201 lb (91.173 kg)  SpO2 95% Wt Readings from Last 3 Encounters:  01/19/14 201 lb (91.173 kg)  10/05/13 195 lb (88.451 kg)  09/09/13 198 lb 9.6 oz (90.084 kg)   Constitutional: overweight, in NAD Eyes: PERRLA, EOMI, no exophthalmos ENT: moist mucous membranes, cervical scar healing nicely - no dysesthesia, no cervical lymphadenopathy Cardiovascular: RRR, No MRG Respiratory: CTA B Gastrointestinal: abdomen soft, NT, ND, BS+ Musculoskeletal: no deformities, strength intact in all 4;  Skin: moist, warm, no rashes Neurological: no tremor with outstretched hands, DTR normal in all 4  ASSESSMENT: 1. Thyroid cancer - Papillary Thy Ca, follicular variant   2. Postsurgical hypothyroidism  PLAN: 1. PTC - subcm, w/o extension to capsule, lymph or blood vessel, and w/o neck extension - s/p total thyroidectomy in 06/2013 - explained why RAI tx not recommended for such a low risk, small, cancer focus >> we will follow it by U/s >> will check one in 3 months - Thyroglobulin check is not indicated (cannot differentiate b/w healthy and cancerous thyroid tissue w/o prior RAI ablation) - we will following her with thyroid U/S >> will check  a new one in 03/2013 , then in 1 year, then in 2 years, then less often - she agrees to plan  2. Post-surgical hypothyroidism - on Synthroid brand name now, 75 mcg daily - taking it EVERY DAY, with water, >30 min before b'fast, separated by >4h from anti acid medication, calcium, iron, MVI. - check TFTs today - we will target a low normal range of TSH. Will need to see if we can increase the  dose to help her fatigue - will need a refill of Synthroid.  Office Visit on 01/19/2014  Component Date Value Ref Range Status  . TSH 01/19/2014 2.76  0.35 - 4.50 uIU/mL Final  . Free T4 01/19/2014 0.86  0.60 - 1.60 ng/dL Final   TSH >2. Will increase Synthroid to 88 mcg daily. Repeat labs in 2 mo.

## 2014-01-19 NOTE — Patient Instructions (Addendum)
Please stop at the lab. Please come back for a follow-up appointment in 4 months. Please call me in 3 months to order the thyroid ultrasound so that we can have the results when you come back.

## 2014-01-20 ENCOUNTER — Ambulatory Visit (INDEPENDENT_AMBULATORY_CARE_PROVIDER_SITE_OTHER): Payer: Commercial Managed Care - PPO | Admitting: Surgery

## 2014-01-20 ENCOUNTER — Encounter (INDEPENDENT_AMBULATORY_CARE_PROVIDER_SITE_OTHER): Payer: Self-pay | Admitting: Surgery

## 2014-01-20 VITALS — BP 118/74 | HR 66 | Temp 97.4°F | Resp 16 | Ht 64.0 in | Wt 199.4 lb

## 2014-01-20 DIAGNOSIS — E890 Postprocedural hypothyroidism: Secondary | ICD-10-CM

## 2014-01-20 DIAGNOSIS — C73 Malignant neoplasm of thyroid gland: Secondary | ICD-10-CM

## 2014-01-20 LAB — T4, FREE: Free T4: 0.86 ng/dL (ref 0.60–1.60)

## 2014-01-20 LAB — TSH: TSH: 2.76 u[IU]/mL (ref 0.35–4.50)

## 2014-01-20 MED ORDER — SYNTHROID 88 MCG PO TABS: 88.0000 ug | ORAL_TABLET | Freq: Every day | ORAL | Status: DC

## 2014-01-20 NOTE — Progress Notes (Signed)
General Surgery [facility]  Chief Complaint  Patient presents with  . 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 She returns for scheduled evaluation.  Patient is taking Synthroid 75 mcg daily. Her most recent TSH level was drawn this morning and the results are pending. She is being followed closely by her endocrinologist. She has an ultrasound scheduled for September 2015.  PERTINENT REVIEW OF SYSTEMS: Mild fatigue. Denies tremor. Denies palpitation. Denies compressive symptoms. Voice quality is normal.  EXAM: HEENT: normocephalic; pupils equal and reactive; sclerae clear; dentition good; mucous membranes moist NECK:  Well-healed surgical incision; no palpable masses in the thyroid bed; symmetric on extension; no palpable anterior or posterior cervical lymphadenopathy; no supraclavicular masses; no tenderness CHEST: clear to auscultation bilaterally without rales, rhonchi, or wheezes CARDIAC: regular rate and rhythm without significant murmur; peripheral pulses are full EXT:  non-tender without edema; no deformity NEURO: no gross focal deficits; no sign of tremor   IMPRESSION: #1 follicular variant of papillary thyroid carcinoma, status post total thyroidectomy, no clinical evidence of recurrence  #2 post surgical hypothyroidism  PLAN: Patient will continue close followup with her endocrinologist. She will have an ultrasound performed in September 2015. She will return to see me for scheduled evaluation in 6 months.  Earnstine Regal, MD, [facility] Office: [phone]  Visit Diagnoses: 1. Thyroid cancer, follicular variant of papillary carcinoma   2. Postsurgical hypothyroidism

## 2014-03-22 ENCOUNTER — Encounter: Payer: Self-pay | Admitting: Internal Medicine

## 2014-03-23 ENCOUNTER — Other Ambulatory Visit: Payer: Self-pay | Admitting: Internal Medicine

## 2014-03-23 DIAGNOSIS — C73 Malignant neoplasm of thyroid gland: Secondary | ICD-10-CM

## 2014-04-02 ENCOUNTER — Ambulatory Visit
Admission: RE | Admit: 2014-04-02 | Discharge: 2014-04-02 | Disposition: A | Discharge status: Home / Self Care | Payer: 59 | Source: Ambulatory Visit | Attending: Internal Medicine | Admitting: Internal Medicine

## 2014-04-19 ENCOUNTER — Ambulatory Visit (INDEPENDENT_AMBULATORY_CARE_PROVIDER_SITE_OTHER): Payer: 59 | Admitting: Family Medicine

## 2014-04-19 ENCOUNTER — Encounter: Payer: Self-pay | Admitting: Family Medicine

## 2014-04-19 VITALS — BP 130/90 | HR 71 | Temp 98.0°F | Resp 16 | Ht 62.75 in | Wt 201.2 lb

## 2014-04-19 DIAGNOSIS — G4700 Insomnia, unspecified: Secondary | ICD-10-CM

## 2014-04-19 DIAGNOSIS — Z13220 Encounter for screening for lipoid disorders: Secondary | ICD-10-CM

## 2014-04-19 DIAGNOSIS — Z131 Encounter for screening for diabetes mellitus: Secondary | ICD-10-CM

## 2014-04-19 DIAGNOSIS — Z130 Encounter for screening for diseases of the blood and blood-forming organs and certain disorders involving the immune mechanism: Secondary | ICD-10-CM

## 2014-04-19 DIAGNOSIS — Z0000 Encounter for general adult medical examination without abnormal findings: Secondary | ICD-10-CM

## 2014-04-19 DIAGNOSIS — R6889 Other general symptoms and signs: Secondary | ICD-10-CM

## 2014-04-19 DIAGNOSIS — Z124 Encounter for screening for malignant neoplasm of cervix: Secondary | ICD-10-CM

## 2014-04-19 DIAGNOSIS — Z23 Encounter for immunization: Secondary | ICD-10-CM

## 2014-04-19 LAB — CBC
HCT: 38.8 % (ref 36.0–46.0)
Hemoglobin: 13 g/dL (ref 12.0–15.0)
MCH: 27.3 pg (ref 26.0–34.0)
MCHC: 33.5 g/dL (ref 30.0–36.0)
MCV: 81.5 fL (ref 78.0–100.0)
Platelets: 448 10*3/uL — ABNORMAL HIGH (ref 150–400)
RBC: 4.76 MIL/uL (ref 3.87–5.11)
RDW: 13.9 % (ref 11.5–15.5)
WBC: 8.3 10*3/uL (ref 4.0–10.5)

## 2014-04-19 LAB — COMPREHENSIVE METABOLIC PANEL
ALT: 11 U/L (ref 0–35)
AST: 15 U/L (ref 0–37)
Albumin: 4 g/dL (ref 3.5–5.2)
Alkaline Phosphatase: 57 U/L (ref 39–117)
BUN: 8 mg/dL (ref 6–23)
CO2: 29 mEq/L (ref 19–32)
Calcium: 9.1 mg/dL (ref 8.4–10.5)
Chloride: 104 mEq/L (ref 96–112)
Creat: 0.57 mg/dL (ref 0.50–1.10)
Glucose, Bld: 78 mg/dL (ref 70–99)
Potassium: 3.9 mEq/L (ref 3.5–5.3)
Sodium: 140 mEq/L (ref 135–145)
Total Bilirubin: 0.5 mg/dL (ref 0.2–1.2)
Total Protein: 7 g/dL (ref 6.0–8.3)

## 2014-04-19 LAB — LIPID PANEL
CHOL/HDL RATIO: 3.6 ratio
Cholesterol: 234 mg/dL — ABNORMAL HIGH (ref 0–200)
HDL: 65 mg/dL (ref >39)
LDL CALC: 156 mg/dL — AB (ref 0–99)
Triglycerides: 67 mg/dL (ref <150)
VLDL: 13 mg/dL (ref 0–40)

## 2014-04-19 MED ORDER — LORAZEPAM 1 MG PO TABS: ORAL_TABLET | ORAL | Status: DC

## 2014-04-19 NOTE — Patient Instructions (Signed)
I will be in touch with your labs asap.  Great to see you today!   Please do schedule a mammogram soon;  [facility]: [phone].  Also, it is past time for a colonoscopy.  Please schedule this with the GI doctor of your choice either in [place] or [place].  It might be a good idea to call your insurance company and find out if any locations are preferred for your mammo/ colon  I will refer you to cardiology to make sure all is well.

## 2014-04-19 NOTE — Progress Notes (Signed)
[facility] [address], [place] [address] [phone]  Date:  04/19/2014   Name:  Karen Wall   DOB:  May 09, 1956   MRN:  [id number]  PCP:  Lamar Blinks, MD    Chief Complaint: Annual Exam and Medication Refill   History of Present Illness:  Karen Wall is a 57 y.o. very pleasant female patient who presents with the following:  Here today for a CPE and pap.  She has thyroid cancer s/p thyroidectomy- Dr. Cruzita Lederer is her endocrinologist.  She feels better but is not yet 100%.  She notes that she has a hard time walking as far as she used to. In the past she could walk a couple of miles easily but now gets too tired after 1 mild.  No chest pain but she will get short of breath.  She has no history of CAD, but there is a strong family history of MI and stroke.  Her father had his first MI in his 78s.   She is fasting today.   Last mammogram "years" ago.  She also has not had a colonoscopy.  Last tetanus about 5 years ago- she had the tdap she thinks.    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    Patient Active Problem List   Diagnosis Date Noted  . 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 Postsurgical hypothyroidism 07/24/2013    Past Medical History  Diagnosis Date  . GERD (gastroesophageal reflux disease)   . Thyroid disease     hyperthyroidism  . Dysrhythmia     a -fib due to thyroid problems - resolved with thyroid med  . Numbness     Left side of face around mouth - unknown etiology  . H/O hiatal hernia     "very small "  . Hyperthyroidism     Past Surgical History  Procedure Laterality Date  . Cholecystectomy  1992  . Breast biopsy    . Thyroidectomy N/A 07/03/2013    Procedure: TOTAL THYROIDECTOMY;  Surgeon: Earnstine Regal, MD;  Location: [facility];  Service: General;  Laterality: N/A;    History  Substance Use Topics  . Smoking status: Never Smoker   . Smokeless tobacco: Never Used  .  Alcohol Use: No    Family History  Problem Relation Age of Onset  . Hypertension Mother   . Mental illness Father   . Mental illness Maternal Grandmother   . Stroke Maternal Grandfather     Allergies  Allergen Reactions  . Penicillins     unknown    Medication list has been reviewed and updated.  Current Outpatient Prescriptions on File Prior to Visit  Medication Sig Dispense Refill  . acetaminophen (TYLENOL) 500 MG tablet Take 500 mg by mouth every 6 (six) hours as needed for pain.      . diphenhydrAMINE (BENADRYL) 25 MG tablet Take 25 mg by mouth at bedtime as needed for sleep.       . IBUPROFEN PO Take by mouth.      Marland Kitchen LORazepam (ATIVAN) 1 MG tablet Take 1 at bedtime as needed for insomnia.  May take 2 if needed.  30 tablet  0  . SYNTHROID 88 MCG tablet Take 1 tablet (88 mcg total) by mouth daily before breakfast.  60 tablet  2   No current facility-administered medications on file prior to visit.    Review of Systems:  As per HPI-  otherwise negative.   Physical Examination: Filed Vitals:   04/19/14 1014  BP: 130/90  Pulse: 71  Temp: 98 F (36.7 C)  Resp: 16   Filed Vitals:   04/19/14 1014  Height: 5' 2.75" (1.594 m)  Weight: 201 lb 3.2 oz (91.264 kg)   Body mass index is 35.92 kg/(m^2). Ideal Body Weight: Weight in (lb) to have BMI = 25: 139.7  GEN: WDWN, NAD, Non-toxic, A & O x 3, overweight but looks well HEENT: Atraumatic, Normocephalic. Neck supple. No masses, No LAD.  Bilateral TM wnl, oropharynx normal.  PEERL,EOMI.   Ears and Nose: No external deformity. CV: RRR, No M/G/R. No JVD. No thrill. No extra heart sounds. PULM: CTA B, no wheezes, crackles, rhonchi. No retractions. No resp. distress. No accessory muscle use. ABD: S, NT, ND. No rebound. No HSM. EXTR: No c/c/e NEURO Normal gait.  PSYCH: Normally interactive. Conversant. Not depressed or anxious appearing.  Calm demeanor.  Breast: normal exam, no masses/ dimpling/ discharge Pelvic: normal,  no vaginal lesions or discharge. Uterus normal, no CMT, no adnexal tendereness or masses    Assessment and Plan: Physical exam  Flu vaccine need - Plan: Flu Vaccine QUAD 36+ mos IM  Decreased exercise tolerance - Plan: Ambulatory referral to Cardiology  Screening for cervical cancer - Plan: Pap IG and HPV (high risk) DNA detection  Screening for hyperlipidemia - Plan: Lipid panel  Screening for diabetes mellitus - Plan: Comprehensive metabolic panel  Screening for deficiency anemia - Plan: CBC  Insomnia - Plan: LORazepam (ATIVAN) 1 MG tablet  Refilled the ativan that she uses on occasion for sleep Flu shot and labs today Encouraged her to catch up on her health maintenance with a mammogram and colonoscopy.   Will refer to cardiology to ensure all is well given her reduced exercise tolerance and family history of cardiac disease   Signed Lamar Blinks, MD

## 2014-04-20 LAB — PAP IG AND HPV HIGH-RISK: HPV DNA HIGH RISK: NOT DETECTED

## 2014-05-25 NOTE — Progress Notes (Signed)
HPI: 58 year old female for evaluation of dyspnea. No prior cardiac history. Patient had thyroidectomy approximately 1 year ago. Since that time she has noticed increased dyspnea on exertion. There is no orthopnea or PND. Occasional mild pedal edema towards the end of her shift. She also has occasional chest tightness with exertion relieved with rest. She states it feels "as though I can't take a deep breath". Occasional brief palpitations but no syncope. No recent travel.  Current Outpatient Prescriptions  Medication Sig Dispense Refill  . acetaminophen (TYLENOL) 500 MG tablet Take 500 mg by mouth every 6 (six) hours as needed for pain.    . diphenhydrAMINE (BENADRYL) 25 MG tablet Take 25 mg by mouth at bedtime as needed for sleep.     . IBUPROFEN PO Take 200 mg by mouth as needed.     Marland Kitchen LORazepam (ATIVAN) 1 MG tablet Take 1 at bedtime as needed for insomnia.  May take 2 if needed. 30 tablet 1  . SYNTHROID 88 MCG tablet Take 1 tablet (88 mcg total) by mouth daily before breakfast. 60 tablet 2   No current facility-administered medications for this visit.    Allergies  Allergen Reactions  . Penicillins     unknown     Past Medical History  Diagnosis Date  . GERD (gastroesophageal reflux disease)   . Thyroid disease     hyperthyroidism  . Numbness     Left side of face around mouth - unknown etiology  . H/O hiatal hernia     "very small "  . Hyperlipidemia   . Thyroid cancer   . Palpitations     Occurred during hyperthyroid    Past Surgical History  Procedure Laterality Date  . Cholecystectomy  1992  . Breast biopsy    . Thyroidectomy N/A 07/03/2013    Procedure: TOTAL THYROIDECTOMY;  Surgeon: Earnstine Regal, MD;  Location: [facility];  Service: General;  Laterality: N/A;    History   Social History  . Marital Status: Divorced    Spouse Name: N/A    Number of Children: 1  . Years of Education: N/A   Occupational History  . Not on file.   Social History Main  Topics  . Smoking status: Never Smoker   . Smokeless tobacco: Never Used  . Alcohol Use: No  . Drug Use: No  . Sexual Activity: Not on file   Other Topics Concern  . Not on file   Social History Narrative    Family History  Problem Relation Age of Onset  . Hypertension Mother   . Mental illness Father   . Mental illness Maternal Grandmother   . Stroke Maternal Grandfather   . CAD Father     MI in his 68s    ROS: no fevers or chills, productive cough, hemoptysis, dysphasia, odynophagia, melena, hematochezia, dysuria, hematuria, rash, seizure activity, orthopnea, PND, pedal edema, claudication. Remaining systems are negative.  Physical Exam:   Blood pressure 122/86, pulse 82, height 5\' 2"  (1.575 m), weight 203 lb (92.08 kg).  General:  Well developed/obese in NAD Skin warm/dry Patient not depressed No peripheral clubbing Back-normal HEENT-normal/normal eyelids Neck supple/normal carotid upstroke bilaterally; no bruits; no JVD; Previous thyroidectomy chest - CTA/ normal expansion CV - RRR/normal S1 and S2; no murmurs, rubs or gallops;  PMI nondisplaced Abdomen -NT/ND, no HSM, no mass, + bowel sounds, no bruit 2+ femoral pulses, no bruits Ext-no edema, chords, 2+ DP Neuro-grossly nonfocal  ECG Normal sinus rhythm at  a rate of 82. Nonspecific ST changes.

## 2014-05-28 ENCOUNTER — Encounter: Payer: Self-pay | Admitting: Cardiology

## 2014-05-28 ENCOUNTER — Encounter: Payer: Self-pay | Admitting: *Deleted

## 2014-05-28 ENCOUNTER — Ambulatory Visit (INDEPENDENT_AMBULATORY_CARE_PROVIDER_SITE_OTHER): Payer: 59 | Admitting: Cardiology

## 2014-05-28 VITALS — BP 122/86 | HR 82 | Ht 62.0 in | Wt 203.0 lb

## 2014-05-28 DIAGNOSIS — R0789 Other chest pain: Secondary | ICD-10-CM

## 2014-05-28 DIAGNOSIS — R0600 Dyspnea, unspecified: Secondary | ICD-10-CM

## 2014-05-28 DIAGNOSIS — R079 Chest pain, unspecified: Secondary | ICD-10-CM

## 2014-05-28 HISTORY — DX: Other chest pain: R07.89

## 2014-05-28 NOTE — Patient Instructions (Signed)
Your physician recommends that you schedule a follow-up appointment in: [place] has requested that you have an echocardiogram. Echocardiography is a painless test that uses sound waves to create images of your heart. It provides your doctor with information about the size and shape of your heart and how well your heart's chambers and valves are working. This procedure takes approximately one hour. There are no restrictions for this procedure.AT [place]  Your physician has requested that you have a stress echocardiogram. For further information please visit [email or web address]. Please follow instruction sheet as given.  AT [place]   Exercise Stress Echocardiogram An exercise stress echocardiogram is a heart (cardiac) test used to check the function of your heart. This test may also be called an exercise stress echocardiography or stress echo. This stress test will check how well your heart muscle and valves are working and determine if your heart muscle is getting enough blood. You will exercise on a treadmill to naturally increase or stress the functioning of your heart.  An echocardiogram uses sound waves (ultrasound) to produce an image of your heart. If your heart does not work normally, it may indicate coronary artery disease with poor coronary blood supply. The coronary arteries are the arteries that bring blood and oxygen to your heart. LET [facility] CARE PROVIDER KNOW ABOUT:  Any allergies you have.  All medicines you are taking, including vitamins, herbs, eye drops, creams, and over-the-counter medicines.  Previous problems you or members of your family have had with the use of anesthetics.  Any blood disorders you have.  Previous surgeries you have had.  Medical conditions you have.  Possibility of pregnancy, if this applies. RISKS AND COMPLICATIONS Generally, this is a safe procedure. However, as with any procedure,  complications can occur. Possible complications can include:  You develop pain or pressure in the following areas:  Chest.  Jaw or neck.  Between your shoulder blades.  Radiating down your left arm.  Dizziness or lightheadedness.  Shortness of breath.  Increased or irregular heartbeat.  Nausea or vomiting.  Heart attack (rare). BEFORE THE PROCEDURE  Avoid all forms of caffeine for 24 hours before your test or as directed by your health care provider. This includes coffee, tea (even decaffeinated tea), caffeinated sodas, chocolate, cocoa, and certain pain medicines.  Follow your health care provider's instructions regarding eating and drinking before the test.  Take your medicines as directed at regular times with water unless instructed otherwise. Exceptions may include:  If you have diabetes, ask how you are to take your insulin or pills. It is common to adjust insulin dosing the morning of the test.  If you are taking beta-blocker medicines, it is important to talk to your health care provider about these medicines well before the date of your test. Taking beta-blocker medicines may interfere with the test. In some cases, these medicines need to be changed or stopped 24 hours or more before the test.  If you wear a nitroglycerin patch, it may need to be removed prior to the test. Ask your health care provider if the patch should be removed before the test.  If you use an inhaler for any breathing condition, bring it with you to the test.  If you are an outpatient, bring a snack so you can eat right after the stress phase of the test.  Do not smoke for 4 hours prior to the test or as directed by your  health care provider.  Wear loose-fitting clothes and comfortable shoes for the test. This test involves walking on a treadmill. PROCEDURE   Multiple electrodes will be put on your chest. If needed, small areas of your chest may be shaved to get better contact with the  electrodes. Once the electrodes are attached to your body, multiple wires will be attached to the electrodes, and your heart rate will be monitored.  You will have an echocardiogram done at rest.  To produce this image of your heart, gel is applied to your chest, and a wand-like tool (transducer) is moved over the chest. The transducer sends the sound waves through the chest to create the moving images of your heart.  You may need an IV to receive a medication that improves the quality of the pictures.  You will then walk on a treadmill. The treadmill will be started at a slow pace. The treadmill speed and incline will gradually be increased to raise your heart rate.  At the peak of exercise, the treadmill will be stopped. You will lie down immediately on a bed so that a second echocardiogram can be done to visualize your heart's motion with exercise.  The test usually takes 30-60 minutes to complete. AFTER THE PROCEDURE  Your heart rate and blood pressure will be monitored after the test.  You may return to your normal schedule, including diet, activities, and medicines, unless your health care provider tells you otherwise. Document Released: 07/06/2004 Document Revised: 07/07/2013 Document Reviewed: 03/09/2013 [facility] Patient Information 2015 [place], [place]. This information is not intended to replace advice given to you by your health care provider. Make sure you discuss any questions you have with your health care provider.

## 2014-05-28 NOTE — Assessment & Plan Note (Signed)
Some chest tightness with activities relieved with rest. Will plan stress echocardiogram to screen for ischemia. Electrocardiogram shows nonspecific ST changes.

## 2014-05-28 NOTE — Assessment & Plan Note (Signed)
Etiology unclear; possible contribution from OHS and deconditioning; Electrocardiogram shows sinus rhythm with nonspecific ST changes. We will plan an echocardiogram to assess LV systolic and diastolic function. We will also plan stress echocardiogram to screen for coronary disease. She does not have risk factors for pulmonary embolus.

## 2014-06-01 ENCOUNTER — Other Ambulatory Visit: Payer: Self-pay | Admitting: *Deleted

## 2014-06-01 ENCOUNTER — Encounter: Payer: Self-pay | Admitting: Internal Medicine

## 2014-06-01 DIAGNOSIS — E890 Postprocedural hypothyroidism: Secondary | ICD-10-CM

## 2014-06-03 ENCOUNTER — Other Ambulatory Visit (INDEPENDENT_AMBULATORY_CARE_PROVIDER_SITE_OTHER): Payer: 59

## 2014-06-03 DIAGNOSIS — E890 Postprocedural hypothyroidism: Secondary | ICD-10-CM

## 2014-06-03 LAB — TSH: TSH: 1.64 u[IU]/mL (ref 0.35–4.50)

## 2014-06-03 LAB — T4, FREE: Free T4: 1.08 ng/dL (ref 0.60–1.60)

## 2014-06-03 LAB — T3, FREE: T3 FREE: 2.7 pg/mL (ref 2.3–4.2)

## 2014-06-04 ENCOUNTER — Other Ambulatory Visit: Payer: 59

## 2014-06-07 ENCOUNTER — Ambulatory Visit (HOSPITAL_BASED_OUTPATIENT_CLINIC_OR_DEPARTMENT_OTHER): Payer: 59 | Admitting: Radiology

## 2014-06-07 ENCOUNTER — Ambulatory Visit (HOSPITAL_COMMUNITY): Payer: 59 | Attending: Cardiology

## 2014-06-07 DIAGNOSIS — R079 Chest pain, unspecified: Secondary | ICD-10-CM | POA: Diagnosis not present

## 2014-06-07 DIAGNOSIS — R0600 Dyspnea, unspecified: Secondary | ICD-10-CM

## 2014-06-07 DIAGNOSIS — E785 Hyperlipidemia, unspecified: Secondary | ICD-10-CM | POA: Diagnosis not present

## 2014-06-07 DIAGNOSIS — E669 Obesity, unspecified: Secondary | ICD-10-CM | POA: Insufficient documentation

## 2014-06-07 NOTE — Progress Notes (Signed)
Stress Echocardiogram performed.  

## 2014-06-07 NOTE — Progress Notes (Signed)
Echocardiogram performed.  

## 2014-07-15 ENCOUNTER — Other Ambulatory Visit: Payer: Self-pay | Admitting: *Deleted

## 2014-07-15 ENCOUNTER — Encounter: Payer: Self-pay | Admitting: Internal Medicine

## 2014-07-15 MED ORDER — SYNTHROID 88 MCG PO TABS: 88.0000 ug | ORAL_TABLET | Freq: Every day | ORAL | Status: DC

## 2014-07-27 NOTE — Progress Notes (Signed)
      HPI: FU dyspnea. Echo 11/15 showed normal LV function, grade 1 diastolic dysfunction; possible wall motion abnormality in inferolateral wall. Stress echo 11/15 normal. Since last seen,   Current Outpatient Prescriptions  Medication Sig Dispense Refill  . acetaminophen (TYLENOL) 500 MG tablet Take 500 mg by mouth every 6 (six) hours as needed for pain.    . diphenhydrAMINE (BENADRYL) 25 MG tablet Take 25 mg by mouth at bedtime as needed for sleep.     . IBUPROFEN PO Take 200 mg by mouth as needed.     Marland Kitchen LORazepam (ATIVAN) 1 MG tablet Take 1 at bedtime as needed for insomnia.  May take 2 if needed. 30 tablet 1  . SYNTHROID 88 MCG tablet Take 1 tablet (88 mcg total) by mouth daily before breakfast. 60 tablet 2   No current facility-administered medications for this visit.     Past Medical History  Diagnosis Date  . GERD (gastroesophageal reflux disease)   . Thyroid disease     hyperthyroidism  . Numbness     Left side of face around mouth - unknown etiology  . H/O hiatal hernia     "very small "  . Hyperlipidemia   . Thyroid cancer   . Palpitations     Occurred during hyperthyroid    Past Surgical History  Procedure Laterality Date  . Cholecystectomy  1992  . Breast biopsy    . Thyroidectomy N/A 07/03/2013    Procedure: TOTAL THYROIDECTOMY;  Surgeon: Earnstine Regal, MD;  Location: [facility];  Service: General;  Laterality: N/A;    History   Social History  . Marital Status: Divorced    Spouse Name: N/A    Number of Children: 1  . Years of Education: N/A   Occupational History  . Not on file.   Social History Main Topics  . Smoking status: Never Smoker   . Smokeless tobacco: Never Used  . Alcohol Use: No  . Drug Use: No  . Sexual Activity: Not on file   Other Topics Concern  . Not on file   Social History Narrative    ROS: no fevers or chills, productive cough, hemoptysis, dysphasia, odynophagia, melena, hematochezia, dysuria, hematuria, rash, seizure  activity, orthopnea, PND, pedal edema, claudication. Remaining systems are negative.  Physical Exam: Well-developed well-nourished in no acute distress.  Skin is warm and dry.  HEENT is normal.  Neck is supple.  Chest is clear to auscultation with normal expansion.  Cardiovascular exam is regular rate and rhythm.  Abdominal exam nontender or distended. No masses palpated. Extremities show no edema. neuro grossly intact  ECG     This encounter was created in error - please disregard.

## 2014-07-29 ENCOUNTER — Encounter: Payer: 59 | Admitting: Cardiology

## 2014-08-04 ENCOUNTER — Ambulatory Visit: Payer: 59 | Admitting: Cardiology

## 2014-08-11 ENCOUNTER — Ambulatory Visit: Payer: 59 | Admitting: Cardiology

## 2014-08-24 ENCOUNTER — Ambulatory Visit (INDEPENDENT_AMBULATORY_CARE_PROVIDER_SITE_OTHER): Payer: 59 | Admitting: Internal Medicine

## 2014-08-24 ENCOUNTER — Encounter: Payer: Self-pay | Admitting: Family Medicine

## 2014-08-24 ENCOUNTER — Encounter: Payer: Self-pay | Admitting: Internal Medicine

## 2014-08-24 VITALS — BP 124/80 | HR 83 | Temp 97.7°F | Resp 12 | Wt 202.0 lb

## 2014-08-24 DIAGNOSIS — R5382 Chronic fatigue, unspecified: Secondary | ICD-10-CM

## 2014-08-24 DIAGNOSIS — C73 Malignant neoplasm of thyroid gland: Secondary | ICD-10-CM

## 2014-08-24 DIAGNOSIS — E559 Vitamin D deficiency, unspecified: Secondary | ICD-10-CM

## 2014-08-24 DIAGNOSIS — E890 Postprocedural hypothyroidism: Secondary | ICD-10-CM

## 2014-08-24 LAB — T4, FREE: FREE T4: 1.14 ng/dL (ref 0.60–1.60)

## 2014-08-24 LAB — TSH: TSH: 1.12 u[IU]/mL (ref 0.35–4.50)

## 2014-08-24 LAB — VITAMIN B12: VITAMIN B 12: 252 pg/mL (ref 211–911)

## 2014-08-24 LAB — FERRITIN: Ferritin: 48.3 ng/mL (ref 10.0–291.0)

## 2014-08-24 LAB — VITAMIN D 25 HYDROXY (VIT D DEFICIENCY, FRACTURES): VITD: 18.97 ng/mL — AB (ref 30.00–100.00)

## 2014-08-24 LAB — SEDIMENTATION RATE: Sed Rate: 26 mm/hr — ABNORMAL HIGH (ref 0–22)

## 2014-08-24 NOTE — Progress Notes (Signed)
Patient ID: Karen Wall, female   DOB: [birth year], 59 y.o.   MRN: [id number]   HPI  Karen Wall is a 59 y.o.-year-old female, returning for f/u for thyroid cancer - follicular variant of PTC and postsurgical hypothyroidism. Last visit 3.5 mo ago.  Reviewed hx: In 03/2013, she developed fevers >> TSH checked >> hyperthyroidism (TMNG) >> started MMI >> continued for 1 month. She also had a thyroid U/S in 03/2013 >> 2 thyroid nodules >> FNA of each in 04/2013 was negative. However, she opted for thyroidectomy for the TMNG >> total thyroidectomy >> one focus of follicular variant of PTC of 0.8 cm. Dr Loanne Drilling advised against RAI Tx and against following Thyroglobulin. She then sought a second opinion with me >> I recommended same.  Post-surgical hypothyroidism: I reviewed pt's thyroid tests: Lab Results  Component Value Date   TSH 1.64 06/03/2014   TSH 2.76 01/19/2014   TSH 1.42 10/05/2013   TSH 14.627* 08/31/2013   TSH 0.074* 08/06/2013   TSH 10.64* 06/23/2013   TSH 0.014* 05/12/2013   TSH 0.010* 04/09/2013   FREET4 1.08 06/03/2014   FREET4 0.86 01/19/2014   FREET4 0.93 10/05/2013   FREET4 0.37* 06/23/2013   FREET4 0.80 05/12/2013   FREET4 1.54 04/09/2013   Started on 100 mcg Synthroid  - brand name after sx >> TSH 0.074 >> dose decrease to 50 mcg >> pt felt terrible >> TSH 14.6 >> dose increased to 75 mcg, but TSH returned above goal, at 2.76 >> we increased to 88 mcg daily, which she continues today as her last TSH was 1.64.  She takes the LT4: - in am, at 5 am - with water - eats b'fast after 7 am - no MVI - was taking it at lunch in the past  Pt denies feeling nodules in neck, hoarseness, dysphagia/odynophagia, SOB with lying down. Last neck U/S was in 03/2014 >> no recurrence.  Pt c/o: - ++ fatigue - after the surgery - no heat intolerance/cold intolerance - no tremors - no palpitations - no hyperdefecation/constipation - no weight loss - no weight gain - no  dry skin - no hair falling  I reviewed pt's medications, allergies, PMH, social hx, family hx, and changes were documented in the history of present illness. Otherwise, unchanged from my initial visit note.  ROS: Constitutional: see HPI, + poor sleep Eyes: no blurry vision, no xerophthalmia ENT: no sore throat, no nodules palpated in throat, no dysphagia/odynophagia, no hoarseness Cardiovascular: no CP/SOB/+ improved palpitations/leg swelling Respiratory: no cough/SOB Gastrointestinal: no N/V/D/C Musculoskeletal: + muscle/+ joint pain Skin: no rashes Neurological: no tremors/numbness/tingling/dizziness  PE: BP 124/80 mmHg  Pulse 83  Temp(Src) 97.7 F (36.5 C) (Oral)  Resp 12  Wt 202 lb (91.627 kg)  SpO2 95% Body mass index is 36.94 kg/(m^2). Wt Readings from Last 3 Encounters:  08/24/14 202 lb (91.627 kg)  05/28/14 203 lb (92.08 kg)  04/19/14 201 lb 3.2 oz (91.264 kg)   Constitutional: obese, in NAD Eyes: PERRLA, EOMI, no exophthalmos ENT: moist mucous membranes, cervical scar healed - no dysesthesia, no cervical lymphadenopathy Cardiovascular: RRR, No MRG Respiratory: CTA B Gastrointestinal: abdomen soft, NT, ND, BS+ Musculoskeletal: no deformities, strength intact in all 4;  Skin: moist, warm, no rashes Neurological: no tremor with outstretched hands, DTR normal in all 4  ASSESSMENT: 1. Thyroid cancer - Papillary Thy Ca, follicular variant  - subcm, w/o extension to capsule, lymph or blood vessel, and w/o neck extension - s/p total thyroidectomy  in 06/2013  2. Postsurgical hypothyroidism  3. Fatigue  PLAN: 1. PTC - we have discussed at this and prior visits that RAI tx is not recommended for such a low risk, small, cancer focus >> we will follow it by U/S. Also, Thyroglobulin check is not indicated (cannot differentiate b/w healthy and cancerous thyroid tissue w/o prior RAI ablation) - we will following her with thyroid U/S >> last in 03/2014 >> no ThyCa  recurrence, ~1 year after prior >> will check new one in 2 years, then less often - she agrees to plan  2. Post-surgical hypothyroidism - on Synthroid brand name now, 88 mcg daily - taking it EVERY DAY, with water, >30 min before b'fast, separated by >4h from anti acid medication, calcium, iron, MVI. - check TFTs today - we will target a low normal range of TSH.   3. Fatigue - started after thyroidectomy - despite TFTs at target - check vitamin D, vitamin B12, ESR,  and ferritin today - advised to start a MVI, improve diet and exercise  Office Visit on 08/24/2014  Component Date Value Ref Range Status  . TSH 08/24/2014 1.12  0.35 - 4.50 uIU/mL Final  . Free T4 08/24/2014 1.14  0.60 - 1.60 ng/dL Final  . Ferritin 08/24/2014 48.3  10.0 - 291.0 ng/mL Final  . VITD 08/24/2014 18.97* 30.00 - 100.00 ng/mL Final  . Vitamin B-12 08/24/2014 252  211 - 911 pg/mL Final  . Sed Rate 08/24/2014 26* 0 - 22 mm/hr Final   Thyroid tests are excellent. Ferritin is normal. Vitamin D is very low, as is vitamin B12. I would suggest replacement with both. Her ESR is also high, question a rheumatologic condition.  I will advise patient to discuss with PCP about the above. We can start B12 and vitamin D supplementation here but she might need more workup for the ESR.

## 2014-08-24 NOTE — Patient Instructions (Signed)
Please stop at the lab.  Please come back for a follow-up appointment in 6 months.  

## 2014-08-27 MED ORDER — VITAMIN D (ERGOCALCIFEROL) 1.25 MG (50000 UNIT) PO CAPS: 50000.0000 [IU] | ORAL_CAPSULE | ORAL | Status: DC

## 2014-10-27 ENCOUNTER — Encounter: Payer: Self-pay | Admitting: Family Medicine

## 2014-11-15 ENCOUNTER — Other Ambulatory Visit: Payer: Self-pay | Admitting: Family Medicine

## 2014-11-16 ENCOUNTER — Encounter: Payer: Self-pay | Admitting: Family Medicine

## 2014-12-16 ENCOUNTER — Encounter: Payer: Self-pay | Admitting: *Deleted

## 2015-01-07 ENCOUNTER — Other Ambulatory Visit: Payer: Self-pay | Admitting: Internal Medicine

## 2015-03-07 ENCOUNTER — Ambulatory Visit (INDEPENDENT_AMBULATORY_CARE_PROVIDER_SITE_OTHER): Payer: 59 | Admitting: Internal Medicine

## 2015-03-07 ENCOUNTER — Encounter: Payer: Self-pay | Admitting: Internal Medicine

## 2015-03-07 VITALS — BP 126/80 | HR 69 | Temp 98.3°F | Wt 208.0 lb

## 2015-03-07 DIAGNOSIS — C73 Malignant neoplasm of thyroid gland: Secondary | ICD-10-CM | POA: Diagnosis not present

## 2015-03-07 DIAGNOSIS — R5382 Chronic fatigue, unspecified: Secondary | ICD-10-CM | POA: Diagnosis not present

## 2015-03-07 DIAGNOSIS — E890 Postprocedural hypothyroidism: Secondary | ICD-10-CM

## 2015-03-07 LAB — T4, FREE: FREE T4: 0.96 ng/dL (ref 0.60–1.60)

## 2015-03-07 LAB — TSH: TSH: 1.57 u[IU]/mL (ref 0.35–4.50)

## 2015-03-07 LAB — VITAMIN B12: Vitamin B-12: 237 pg/mL (ref 211–911)

## 2015-03-07 MED ORDER — SYNTHROID 88 MCG PO TABS: ORAL_TABLET | ORAL | Status: DC

## 2015-03-07 NOTE — Progress Notes (Signed)
Patient ID: Karen Wall, female   DOB: 06-13-1956, 59 y.o.   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 Last visit 6 mo ago.  Reviewed hx: In 03/2013, she developed fevers >> TSH checked >> hyperthyroidism (TMNG) >> started MMI >> continued for 1 month. She also had a thyroid U/S in 03/2013 >> 2 thyroid nodules >> FNA of each in 04/2013 was negative. However, she opted for thyroidectomy for the TMNG >> total thyroidectomy >> one focus of follicular variant of PTC of 0.8 cm. Dr Loanne Drilling advised against RAI Tx and against following Thyroglobulin. She then sought a second opinion with me >> I recommended same.  Post-surgical hypothyroidism: I reviewed pt's thyroid tests: Lab Results  Component Value Date   TSH 1.12 08/24/2014   TSH 1.64 06/03/2014   TSH 2.76 01/19/2014   TSH 1.42 10/05/2013   TSH 14.627* 08/31/2013   TSH 0.074* 08/06/2013   TSH 10.64* 06/23/2013   TSH 0.014* 05/12/2013   TSH 0.010* 04/09/2013   FREET4 1.14 08/24/2014   FREET4 1.08 06/03/2014   FREET4 0.86 01/19/2014   FREET4 0.93 10/05/2013   FREET4 0.37* 06/23/2013   FREET4 0.80 05/12/2013   FREET4 1.54 04/09/2013   Started on 100 mcg Synthroid  - brand name after sx >> TSH 0.074 >> dose decrease to 50 mcg >> pt felt terrible >> TSH 14.6 >> dose increased to 75 mcg, but TSH returned above goal, at 2.76 >> we increased to 88 mcg daily, which she continues today as her TSH remains normal.  She takes the Synthroid 88 mcg DAW: - in am, at 5 am - with water - eats b'fast after 7 am - no MVI  Pt denies feeling nodules in neck, has occas. hoarseness, dysphagia/odynophagia, SOB with lying down. Last neck U/S was in 03/2014 >> no recurrence.  Pt c/o: - + fatigue - after the surgery - no heat intolerance/cold intolerance - no tremors - occas. palpitations - no  hyperdefecation/constipation - no weight loss - no weight gain - no dry skin - Hair loss much better  I reviewed pt's medications, allergies, PMH, social hx, family hx, and changes were documented in the history of present illness. Otherwise, unchanged from my initial visit note.  ROS: Constitutional: see HPI Eyes: no blurry vision, no xerophthalmia ENT: no sore throat, no nodules palpated in throat, no dysphagia/odynophagia, no hoarseness Cardiovascular: no CP/SOB/+ occas. Palpitations/no leg swelling Respiratory: no cough/SOB Gastrointestinal: no N/V/D/C Musculoskeletal: + muscle/+ joint pain Skin: no rashes Neurological: no tremors/numbness/tingling/dizziness  PE: BP 126/80 mmHg  Pulse 69  Temp(Src) 98.3 F (36.8 C)  Wt 208 lb (94.348 kg) Body mass index is 38.03 kg/(m^2). Wt Readings from Last 3 Encounters:  03/07/15 208 lb (94.348 kg)  08/24/14 202 lb (91.627 kg)  05/28/14 203 lb (92.08 kg)   Constitutional: obese, in NAD Eyes: PERRLA, EOMI, no exophthalmos ENT: moist mucous membranes, cervical scar healed, no cervical masses, no cervical lymphadenopathy Cardiovascular: RRR, No MRG Respiratory: CTA B Gastrointestinal: abdomen soft, NT, ND, BS+ Musculoskeletal: no deformities, strength intact in all 4;  Skin: moist, warm, no rashes Neurological: no tremor with outstretched hands, DTR normal in all 4  ASSESSMENT: 1. Thyroid cancer - Papillary Thy Ca, follicular variant  - subcm, w/o extension to capsule, lymph or blood vessel, and w/o neck extension - s/p total thyroidectomy in 06/2013  2. Postsurgical  hypothyroidism  3. Fatigue  PLAN: 1. PTC - we discussed that FV of PTC may not be considered malignant in the future ThyCa guidelines >> this is great news!  - no neck compression sxs, no thyroid masses or cervical LAD on palpation - we will following her with thyroid U/S >> last in 03/2014 >> no ThyCa recurrence, ~1 year after prior >> will check new one in  03/2016, then less often - she agrees to plan  2. Post-surgical hypothyroidism - on Synthroid brand name now, 88 mcg daily - taking it EVERY DAY, with water, >30 min before b'fast, separated by >4h from anti acid medication, calcium, iron, MVI. - stable TFTs per last 2 checks - check TFTs today - we will target a normal range of TSH.   3. Fatigue - started after thyroidectomy - despite TFTs at target - we checked vitamin D (low), vitamin B12 (low normal), ESR (slightly high), and ferritin (normal) - Getting vit D per PCP. No B12 but she started to eat Total cereals (fortified with B12). She is interested in B12 inj >> will recheck B12 + MMA today and decide for or against inj when labs are back.  Needs refills when labs are back.  Office Visit on 03/07/2015  Component Date Value Ref Range Status  . Vitamin B-12 03/07/2015 237  211 - 911 pg/mL Final  . TSH 03/07/2015 1.57  0.35 - 4.50 uIU/mL Final  . Free T4 03/07/2015 0.96  0.60 - 1.60 ng/dL Final   Message sent: Dear Karen Wall, The thyroid tests are wonderful. The vitamin B12 is slightly lower than before. I would suggest at least 6 months of intramuscular B12 vitamin. The methylmalonic acid is still pending, but I don't think this will influence my decision, now that the vitamin B12 is even lower than before. Please let me know if you would like to have the injections here or at Dr. Lillie Fragmin office. Sincerely, Philemon Kingdom MD

## 2015-03-07 NOTE — Patient Instructions (Signed)
Please stop at the lab.  Please return in 1 year.  

## 2015-03-09 LAB — METHYLMALONIC ACID, SERUM: Methylmalonic Acid, Quant: 171 nmol/L (ref 87–318)

## 2015-04-18 ENCOUNTER — Telehealth: Payer: Self-pay | Admitting: Family Medicine

## 2015-04-18 NOTE — Telephone Encounter (Signed)
Unable to reach patient at this time.  Will send a letter to request scheduling a mammogram.

## 2015-05-02 ENCOUNTER — Other Ambulatory Visit: Payer: Self-pay | Admitting: Family Medicine

## 2015-05-02 ENCOUNTER — Other Ambulatory Visit (HOSPITAL_BASED_OUTPATIENT_CLINIC_OR_DEPARTMENT_OTHER): Payer: Self-pay | Admitting: Family Medicine

## 2015-05-02 DIAGNOSIS — Z1231 Encounter for screening mammogram for malignant neoplasm of breast: Secondary | ICD-10-CM

## 2015-05-03 ENCOUNTER — Ambulatory Visit (HOSPITAL_BASED_OUTPATIENT_CLINIC_OR_DEPARTMENT_OTHER)
Admission: RE | Admit: 2015-05-03 | Discharge: 2015-05-03 | Disposition: A | Discharge status: Home / Self Care | Payer: 59 | Source: Ambulatory Visit | Attending: Family Medicine | Admitting: Family Medicine

## 2015-05-03 DIAGNOSIS — Z1231 Encounter for screening mammogram for malignant neoplasm of breast: Secondary | ICD-10-CM | POA: Diagnosis present

## 2015-05-03 DIAGNOSIS — R928 Other abnormal and inconclusive findings on diagnostic imaging of breast: Secondary | ICD-10-CM | POA: Insufficient documentation

## 2015-05-04 ENCOUNTER — Other Ambulatory Visit: Payer: Self-pay | Admitting: Family Medicine

## 2015-05-04 DIAGNOSIS — R928 Other abnormal and inconclusive findings on diagnostic imaging of breast: Secondary | ICD-10-CM

## 2015-05-11 ENCOUNTER — Ambulatory Visit
Admission: RE | Admit: 2015-05-11 | Discharge: 2015-05-11 | Disposition: A | Discharge status: Home / Self Care | Payer: 59 | Source: Ambulatory Visit | Attending: Family Medicine | Admitting: Family Medicine

## 2015-05-11 DIAGNOSIS — R928 Other abnormal and inconclusive findings on diagnostic imaging of breast: Secondary | ICD-10-CM

## 2015-05-18 ENCOUNTER — Encounter: Payer: Self-pay | Admitting: Family Medicine

## 2015-05-18 ENCOUNTER — Ambulatory Visit (INDEPENDENT_AMBULATORY_CARE_PROVIDER_SITE_OTHER): Payer: 59 | Admitting: Family Medicine

## 2015-05-18 VITALS — BP 116/86 | HR 67 | Temp 98.3°F | Resp 16 | Ht 62.75 in | Wt 204.0 lb

## 2015-05-18 DIAGNOSIS — Z130 Encounter for screening for diseases of the blood and blood-forming organs and certain disorders involving the immune mechanism: Secondary | ICD-10-CM | POA: Diagnosis not present

## 2015-05-18 DIAGNOSIS — Z85850 Personal history of malignant neoplasm of thyroid: Secondary | ICD-10-CM | POA: Diagnosis not present

## 2015-05-18 DIAGNOSIS — Z119 Encounter for screening for infectious and parasitic diseases, unspecified: Secondary | ICD-10-CM

## 2015-05-18 DIAGNOSIS — R7989 Other specified abnormal findings of blood chemistry: Secondary | ICD-10-CM | POA: Diagnosis not present

## 2015-05-18 DIAGNOSIS — M25572 Pain in left ankle and joints of left foot: Secondary | ICD-10-CM

## 2015-05-18 DIAGNOSIS — Z131 Encounter for screening for diabetes mellitus: Secondary | ICD-10-CM

## 2015-05-18 DIAGNOSIS — Z13220 Encounter for screening for lipoid disorders: Secondary | ICD-10-CM | POA: Diagnosis not present

## 2015-05-18 DIAGNOSIS — E538 Deficiency of other specified B group vitamins: Secondary | ICD-10-CM | POA: Diagnosis not present

## 2015-05-18 DIAGNOSIS — G4700 Insomnia, unspecified: Secondary | ICD-10-CM | POA: Diagnosis not present

## 2015-05-18 DIAGNOSIS — Z0000 Encounter for general adult medical examination without abnormal findings: Secondary | ICD-10-CM | POA: Diagnosis not present

## 2015-05-18 LAB — LIPID PANEL
CHOL/HDL RATIO: 3.5 ratio (ref <=5.0)
CHOLESTEROL: 212 mg/dL — AB (ref 125–200)
HDL: 60 mg/dL (ref >=46)
LDL Cholesterol: 136 mg/dL — ABNORMAL HIGH (ref <130)
TRIGLYCERIDES: 82 mg/dL (ref <150)
VLDL: 16 mg/dL (ref <30)

## 2015-05-18 LAB — HEMOGLOBIN A1C
Hgb A1c MFr Bld: 5.7 % — ABNORMAL HIGH (ref <5.7)
MEAN PLASMA GLUCOSE: 117 mg/dL — AB (ref <117)

## 2015-05-18 LAB — COMPREHENSIVE METABOLIC PANEL
ALT: 10 U/L (ref 6–29)
AST: 15 U/L (ref 10–35)
Albumin: 3.6 g/dL (ref 3.6–5.1)
Alkaline Phosphatase: 50 U/L (ref 33–130)
BUN: 15 mg/dL (ref 7–25)
CHLORIDE: 101 mmol/L (ref 98–110)
CO2: 22 mmol/L (ref 20–31)
Calcium: 8.7 mg/dL (ref 8.6–10.4)
Creat: 0.74 mg/dL (ref 0.50–1.05)
Glucose, Bld: 85 mg/dL (ref 65–99)
POTASSIUM: 4 mmol/L (ref 3.5–5.3)
Sodium: 136 mmol/L (ref 135–146)
TOTAL PROTEIN: 6.6 g/dL (ref 6.1–8.1)
Total Bilirubin: 0.3 mg/dL (ref 0.2–1.2)

## 2015-05-18 LAB — CBC
HCT: 39.2 % (ref 36.0–46.0)
Hemoglobin: 13 g/dL (ref 12.0–15.0)
MCH: 27.5 pg (ref 26.0–34.0)
MCHC: 33.2 g/dL (ref 30.0–36.0)
MCV: 82.9 fL (ref 78.0–100.0)
MPV: 10.3 fL (ref 8.6–12.4)
PLATELETS: 468 10*3/uL — AB (ref 150–400)
RBC: 4.73 MIL/uL (ref 3.87–5.11)
RDW: 14.4 % (ref 11.5–15.5)
WBC: 9.8 10*3/uL (ref 4.0–10.5)

## 2015-05-18 MED ORDER — LORAZEPAM 1 MG PO TABS: ORAL_TABLET | ORAL | Status: DC

## 2015-05-18 NOTE — Patient Instructions (Signed)
It was good to see you today- I will be in touch with your labs asap You should be contacted soon by the Cologuard people; let me know if you do not hear from them We will set you up to see ortho for your ankle; let me know if any change in the meantime   Wt Readings from Last 3 Encounters:  05/18/15 204 lb (92.534 kg)  03/07/15 208 lb (94.348 kg)  08/24/14 202 lb (91.627 kg)   Your weight is stable; keep working on it!

## 2015-05-18 NOTE — Progress Notes (Signed)
[facility] [address], [name] [address] [phone]  Date:  05/18/2015   Name:  Karen Wall   DOB:  1956-04-20   MRN:  [id number]  PCP:  Karen Blinks, Wall    Chief Complaint: Annual Exam and Medication Refill   History of Present Illness:  ARIETTA Wall is a 59 y.o. very pleasant female patient who presents with the following:  Here today for a CPE and labs.  History of thyroid cancer- now on synthroid.   Uses ativan as needed for anxiety.  She takes a 1/2 tablet on nights before she works for insomnia.   She is fasting today for labs- coffee and water only  Last labs a year ago Lab Results  Component Value Date   TSH 1.57 03/07/2015   Dr. Cruzita Lederer follows her thyroid labs.  She would like to check her B12 level today- Dr. 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    She has noted left ankle pain for several week with no known injury.  It gets worse when she is on her feet for several hours at work.  She tried treatment as for plantar fasciitis but it did not help.  She is using advil a lot recently  Never had a colonoscopy- she is shy about this as she works with all the GI docs in town.  However she would be interested in cologuard testing   Patient Active Problem List   Diagnosis Date Noted  . Dyspnea 05/28/2014  . Chest tightness 05/28/2014  . 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 Postsurgical hypothyroidism 07/24/2013    Past Medical History  Diagnosis Date  . GERD (gastroesophageal reflux disease)   . Thyroid disease     hyperthyroidism  . Numbness     Left side of face around mouth - unknown etiology  . H/O hiatal hernia     "very small "  .  Hyperlipidemia   . Thyroid cancer ([place])   . Palpitations     Occurred during hyperthyroid    Past Surgical History  Procedure Laterality Date  . Cholecystectomy  1992  . Breast biopsy    . Thyroidectomy N/A 07/03/2013    Procedure: TOTAL THYROIDECTOMY;  Surgeon: Karen Wall;  Location: [facility];  Service: General;  Laterality: N/A;    Social History  Substance Use Topics  . Smoking status: Never Smoker   . Smokeless tobacco: Never Used  . Alcohol Use: No    Family History  Problem Relation Age of Onset  . Hypertension Mother   . Mental illness Father   . Mental illness Maternal Grandmother   . Stroke Maternal Grandfather   . CAD Father     MI in his 30s    Allergies  Allergen Reactions  . Penicillins     unknown    Medication list has been reviewed and updated.  Current Outpatient Prescriptions on File Prior to Visit  Medication Sig Dispense Refill  . acetaminophen (TYLENOL) 500 MG tablet Take 500 mg by mouth every 6 (six) hours as needed for pain.    . IBUPROFEN PO Take 200 mg by mouth as needed.     Marland Kitchen  LORazepam (ATIVAN) 1 MG tablet TAKE 1 TABLET BY MOUTH AT BEDTIME AS NEEDED FOR INSOMNIA MAY TAKE 2 TABLETS BY MOUTH IF NEEDED 30 tablet 0  . SYNTHROID 88 MCG tablet TAKE 1 TABLET (88 MCG TOTAL) BY MOUTH DAILY BEFORE BREAKFAST. 90 tablet 3  . Vitamin D, Ergocalciferol, (DRISDOL) 50000 UNITS CAPS capsule Take 1 capsule (50,000 Units total) by mouth every 7 (seven) days. (Patient not taking: Reported on 03/07/2015) 12 capsule 0   No current facility-administered medications on file prior to visit.    Review of Systems:  As per HPI- otherwise negative.   Physical Examination: Filed Vitals:   05/18/15 1255  BP: 116/86  Pulse: 67  Temp: 98.3 F (36.8 C)  Resp: 16   Filed Vitals:   05/18/15 1255  Height: 5' 2.75" (1.594 m)  Weight: 204 lb (92.534 kg)   Body mass index is 36.42 kg/(m^2). Ideal Body Weight: Weight in (lb) to have BMI = 25: 139.7  GEN:  WDWN, NAD, Non-toxic, A & O x 3, obese, OW looks well HEENT: Atraumatic, Normocephalic. Neck supple. No masses, No LAD.  Bilateral TM wnl, oropharynx normal.  PEERL,EOMI.   Ears and Nose: No external deformity. CV: RRR, No M/G/R. No JVD. No thrill. No extra heart sounds. PULM: CTA B, no wheezes, crackles, rhonchi. No retractions. No resp. distress. No accessory muscle use. ABD: S, NT, ND. No rebound. No HSM. EXTR: No c/c/e NEURO Normal gait.  PSYCH: Normally interactive. Conversant. Not depressed or anxious appearing.  Calm demeanor.  Breast: normal exam, no masses/ dimpling/ discharge Left foot and ankle: no tenderness of the foot or plantar fascia, no redness or swelling She has pain with flexion of the ankle and slight popping in the joint.     Assessment and Plan: Physical exam  History of thyroid cancer  Insomnia - Plan: LORazepam (ATIVAN) 1 MG tablet  Screening for diabetes mellitus - Plan: Comprehensive metabolic panel, Hemoglobin A1c  Screening for hyperlipidemia - Plan: Lipid panel  Low serum vitamin D - Plan: Vitamin D, 25-hydroxy  Low vitamin B12 level - Plan: Vitamin B12  Left ankle pain - Plan: Ambulatory referral to Orthopedic Surgery  Screening for deficiency anemia - Plan: CBC  Screening examination for infectious disease - Plan: Hepatitis C antibody, HIV antibody   Labs pending as above Encouraged weight training to help her fitness level and aid in weight loss   Signed Karen Blinks, Wall

## 2015-05-19 LAB — HEPATITIS C ANTIBODY: HCV Ab: NEGATIVE

## 2015-05-19 LAB — VITAMIN D 25 HYDROXY (VIT D DEFICIENCY, FRACTURES): VIT D 25 HYDROXY: 25 ng/mL — AB (ref 30–100)

## 2015-05-19 LAB — VITAMIN B12: Vitamin B-12: 1228 pg/mL — ABNORMAL HIGH (ref 211–911)

## 2015-05-19 LAB — HIV ANTIBODY (ROUTINE TESTING W REFLEX): HIV 1&2 Ab, 4th Generation: NONREACTIVE

## 2015-08-05 ENCOUNTER — Encounter: Payer: Self-pay | Admitting: Family Medicine

## 2015-08-10 ENCOUNTER — Encounter: Payer: Self-pay | Admitting: Family Medicine

## 2015-08-11 ENCOUNTER — Telehealth: Payer: Self-pay | Admitting: Family Medicine

## 2015-08-11 NOTE — Telephone Encounter (Signed)
lmom to call and reschedule her appt with another provider for a CPE in Nov patient use to see [facility]

## 2015-09-06 MED FILL — SYNTHROID 88 MCG TABLET: 88 | 90 days supply | Qty: 90 | Fill #2

## 2015-09-06 MED FILL — LORazepam 1 MG TABS: 1 | 30 days supply | Qty: 30 | Fill #1

## 2015-11-29 MED FILL — SYNTHROID 88 MCG TABLET: 88 | 90 days supply | Qty: 90 | Fill #3

## 2016-03-01 ENCOUNTER — Other Ambulatory Visit: Payer: Self-pay | Admitting: Internal Medicine

## 2016-03-01 MED FILL — SYNTHROID 88 MCG TABLET: 88 | 90 days supply | Qty: 90 | Fill #0

## 2016-03-15 ENCOUNTER — Ambulatory Visit (INDEPENDENT_AMBULATORY_CARE_PROVIDER_SITE_OTHER): Payer: 59 | Admitting: Internal Medicine

## 2016-03-15 ENCOUNTER — Encounter: Payer: Self-pay | Admitting: Internal Medicine

## 2016-03-15 VITALS — BP 132/78 | HR 63 | Wt 203.0 lb

## 2016-03-15 DIAGNOSIS — C73 Malignant neoplasm of thyroid gland: Secondary | ICD-10-CM

## 2016-03-15 DIAGNOSIS — E559 Vitamin D deficiency, unspecified: Secondary | ICD-10-CM | POA: Diagnosis not present

## 2016-03-15 DIAGNOSIS — E890 Postprocedural hypothyroidism: Secondary | ICD-10-CM

## 2016-03-15 DIAGNOSIS — E538 Deficiency of other specified B group vitamins: Secondary | ICD-10-CM | POA: Diagnosis not present

## 2016-03-15 DIAGNOSIS — R7989 Other specified abnormal findings of blood chemistry: Secondary | ICD-10-CM

## 2016-03-15 HISTORY — DX: Other specified abnormal findings of blood chemistry: R79.89

## 2016-03-15 LAB — T4, FREE: Free T4: 1.07 ng/dL (ref 0.60–1.60)

## 2016-03-15 LAB — TSH: TSH: 1.89 u[IU]/mL (ref 0.35–4.50)

## 2016-03-15 LAB — VITAMIN D 25 HYDROXY (VIT D DEFICIENCY, FRACTURES): VITD: 19.16 ng/mL — ABNORMAL LOW (ref 30.00–100.00)

## 2016-03-15 NOTE — Patient Instructions (Signed)
Please stop at the lab.  Continue Synthroid 88 mcg daily.  Take the thyroid hormone every day, with water, at least 30 minutes before breakfast, separated by at least 4 hours from: - acid reflux medications - calcium - iron - multivitamins  Please return in 1 year.  

## 2016-03-15 NOTE — Progress Notes (Addendum)
Patient ID: Karen Wall, female   DOB: [birth year], 60 y.o.   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 Last visit 6 mo ago.  Reviewed hx: In 03/2013, she developed fevers >> TSH checked >> hyperthyroidism (TMNG) >> started MMI >> continued for 1 month.   She also had a thyroid U/S in 03/2013 >> 2 thyroid nodules >> FNA of each in 04/2013 was negative. However, she opted for thyroidectomy for the TMNG.  She had total thyroidectomy 06/2013 >> one focus of follicular variant of PTC of 0.8 cm: Specimen: Thyroid Procedure: Thyroidectomy Specimen Integrity (intact/fragmented): Intact Tumor focality: Unifocal Dominant tumor: Right lobe Maximum tumor size (cm): 0.8 cm Tumor laterality: Right Histologic type (including subtype and/or unique features as applicable): Follicular variant papillary thyroid carcinoma Tumor capsule: N/A Extrathyroidal extension: No Margins: Free of tumor Lymph - Vascular invasion: No Capsular invasion with degree of invasion if present: N/A Lymph nodes: # examined 0; # positive; N/A TNM code: pT1a, pNX Non-neoplastic thyroid: Multinodular goiter. Comments: There is a 0.8 cm nodule of papillary thyroid carcinoma, follicular variant in the right superior lobe which does not involve the margin and is confined within the right lobe. The remainder of the thyroid shows features of multinodular goiter.  Dr Loanne Drilling advised against RAI Tx and against following Thyroglobulin. She then sought a second opinion with me >> I recommended same.  Reviewed report of neck U/S (04/02/2014): No evidence of abnormal soft tissue in the surgical thyroid bed. Nonenlarged cervical lymph nodes identified.  Pt denies feeling nodules in neck, has occas. hoarseness, dysphagia/odynophagia, SOB with lying down. She feels pressure when lays on the L side.    Post-surgical hypothyroidism: Started on 100 mcg Synthroid  - brand name after sx >> TSH 0.074 >> dose decrease to 50 mcg >> pt felt terrible >> TSH 14.6 >> dose increased to 75 mcg, but TSH returned above goal, at 2.76 >> we increased to 88 mcg daily, which she continues today as her TSH remains normal.  She takes the Synthroid 88 mcg DAW: - in am, at 5 am - with water - eats b'fast after 7 am - no MVI, PPI, Ca, Fe  I reviewed pt's thyroid tests: Lab Results  Component Value Date   TSH 1.57 03/07/2015   TSH 1.12 08/24/2014   TSH 1.64 06/03/2014   TSH 2.76 01/19/2014   TSH 1.42 10/05/2013   TSH 14.627 (H) 08/31/2013   TSH 0.074 (L) 08/06/2013   TSH 10.64 (H) 06/23/2013   TSH 0.014 (L) 05/12/2013   TSH 0.010 (L) 04/09/2013   FREET4 0.96 03/07/2015   FREET4 1.14 08/24/2014   FREET4 1.08 06/03/2014   FREET4 0.86 01/19/2014   FREET4 0.93 10/05/2013   FREET4 0.37 (L) 06/23/2013   FREET4 0.80 05/12/2013   FREET4 1.54 04/09/2013   Pt c/o: - + fatigue - after the surgery - no heat intolerance/cold intolerance - no tremors - occas. palpitations - no hyperdefecation/constipation - no weight loss - no weight gain - no dry skin - Hair loss much better  She had low vitamin D before: Lab Results  Component Value Date   VD25OH 25 (L) 05/18/2015   VD25OH 18.97 (L) 08/24/2014   She was on Ergocalciferol 50,000 IU once a week >> stopped few mo ago.   She also had a low vitamin B12 In the past: 252 >>  237 >> po B12 (1000 mcg ? daily sl) >> 1228.  I reviewed pt's medications, allergies, PMH, social hx, family hx, and changes were documented in the history of present illness. Otherwise, unchanged from my initial visit note.  ROS: Constitutional: see HPI Eyes: no blurry vision, no xerophthalmia ENT: no sore throat, no nodules palpated in throat, no dysphagia/odynophagia, no hoarseness Cardiovascular: no CP/SOB/Palpitations/no leg swelling Respiratory: no  cough/SOB Gastrointestinal: no N/V/D/C Musculoskeletal: + muscle/+ joint pain Skin: no rashes Neurological: no tremors/numbness/tingling/dizziness  PE: BP 132/78 (BP Location: Left Arm, Patient Position: Sitting)   Pulse 63   Wt 203 lb (92.1 kg)   SpO2 94%   BMI 36.25 kg/m  Body mass index is 36.25 kg/m. Wt Readings from Last 3 Encounters:  03/15/16 203 lb (92.1 kg)  05/18/15 204 lb (92.5 kg)  03/07/15 208 lb (94.3 kg)   Constitutional: obese, in NAD Eyes: PERRLA, EOMI, no exophthalmos ENT: moist mucous membranes, cervical scar healed, no cervical masses, + bilateral upper cervical lymphadenopathy Cardiovascular: RRR, No MRG Respiratory: CTA B Gastrointestinal: abdomen soft, NT, ND, BS+ Musculoskeletal: no deformities, strength intact in all 4;  Skin: moist, warm, no rashes Neurological: no tremor with outstretched hands, DTR normal in all 4  ASSESSMENT: 1. Thyroid cancer - Papillary Thy Ca, follicular variant  - subcm, w/o extension to capsule, lymph or blood vessel, and w/o neck extension - s/p total thyroidectomy in 06/2013  2. Postsurgical hypothyroidism  3. Vitamin D deficiency  4. Low vitamin B12   PLAN: 1. PTC - we discussed that FV of PTC is the least aggressive ThyCA  type  - no neck compression sxs, no thyroid masses or cervical LAD on palpation.She mentions that she sometimes feels more pressure on the left side of her neck. - we will follow her with thyroid U/S >> last in 03/2014 >> no ThyCa recurrence - will check new one now, but I believe that we can consider her cured after this, and only check U/S's if she has neck compression sxs or I feel nodules on palpation.   2. Post-surgical hypothyroidism - on Synthroid brand name now, 88 mcg daily - taking it every day with water, >30 min before b'fast, separated by >4h from anti acid medication, calcium, iron, MVI. - stable TFTs >> will recheck TFTs today - we will target a normal range of TSH.   3. And  4. Vitamin D deficiency and low vitamin B12 - Fatigue started after thyroidectomy;  despite TFTs at target - at previous visits, we checked vitamin D (low >> was on supplementation with high-dose vitamin D, however, she stopped), vitamin B12 (low normal >> she takes by mouth B12), ESR (slightly high), and ferritin (normal) - We will check a vitamin D today.  - Return to clinic in 1 year  Orders Placed This Encounter  Procedures  . US Soft Tissue Head/Neck  . T4, free  . TSH  . VITAMIN D 25 Hydroxy (Vit-D Deficiency, Fractures)   Component     Latest Ref Rng & Units 03/15/2016  TSH     0.35 - 4.50 uIU/mL 1.89  T4,Free(Direct)     0.60 - 1.60 ng/dL 4.71  VITD     97.33 - 100.00 ng/mL 19.16 (L)   TFTs are great. Vitamin D level is low, so I would suggest to start 5000 units of vitamin D daily and recheck her vitamin D level in 2 months.  US Soft Tissue Head/Neck  Order: [id number]  Status:  Final  result Visible to patient:  No (Not Released) Dx:  Thyroid cancer, follicular variant of...  Details   Reading Physician Reading Date Result Priority  Markus Daft, MD 03/29/2016   Narrative    CLINICAL DATA: History of thyroid cancer and total thyroidectomy.  EXAM: THYROID ULTRASOUND  COMPARISON: 04/02/2014  FINDINGS: The thyroid is surgically absent. No mass in the thyroid bed. No suspicious lymphadenopathy. Largest right neck lymph node measures up to 0.7 cm in transverse dimension. Largest left neck lymph node measures 0.7 cm in transverse dimension.  IMPRESSION: Normal post-thyroidectomy appearance.  No suspicious lymphadenopathy.   Electronically Signed By: Markus Daft M.D. On: 03/29/2016 14:27        Philemon Kingdom, MD PhD [facility] Endocr[facility]

## 2016-03-25 ENCOUNTER — Encounter: Payer: Self-pay | Admitting: Internal Medicine

## 2016-03-29 ENCOUNTER — Ambulatory Visit (HOSPITAL_BASED_OUTPATIENT_CLINIC_OR_DEPARTMENT_OTHER)
Admission: RE | Admit: 2016-03-29 | Discharge: 2016-03-29 | Disposition: A | Discharge status: Home / Self Care | Payer: 59 | Source: Ambulatory Visit | Attending: Internal Medicine | Admitting: Internal Medicine

## 2016-03-29 DIAGNOSIS — C73 Malignant neoplasm of thyroid gland: Secondary | ICD-10-CM | POA: Diagnosis not present

## 2016-03-29 DIAGNOSIS — Z85850 Personal history of malignant neoplasm of thyroid: Secondary | ICD-10-CM | POA: Diagnosis not present

## 2016-03-29 DIAGNOSIS — E890 Postprocedural hypothyroidism: Secondary | ICD-10-CM | POA: Diagnosis not present

## 2016-05-02 ENCOUNTER — Telehealth: Payer: Self-pay | Admitting: *Deleted

## 2016-05-02 NOTE — Telephone Encounter (Signed)
lmom to cb with status of cologuard.  They have sent out a total of 4 tests.

## 2016-05-21 ENCOUNTER — Encounter: Payer: Self-pay | Admitting: Family Medicine

## 2016-05-21 ENCOUNTER — Ambulatory Visit (INDEPENDENT_AMBULATORY_CARE_PROVIDER_SITE_OTHER): Payer: 59 | Admitting: Family Medicine

## 2016-05-21 VITALS — BP 126/90 | HR 70 | Temp 97.5°F | Ht 63.0 in | Wt 200.2 lb

## 2016-05-21 DIAGNOSIS — Z85850 Personal history of malignant neoplasm of thyroid: Secondary | ICD-10-CM | POA: Diagnosis not present

## 2016-05-21 DIAGNOSIS — Z72820 Sleep deprivation: Secondary | ICD-10-CM

## 2016-05-21 DIAGNOSIS — E559 Vitamin D deficiency, unspecified: Secondary | ICD-10-CM | POA: Diagnosis not present

## 2016-05-21 DIAGNOSIS — Z0000 Encounter for general adult medical examination without abnormal findings: Secondary | ICD-10-CM | POA: Diagnosis not present

## 2016-05-21 DIAGNOSIS — Z5181 Encounter for therapeutic drug level monitoring: Secondary | ICD-10-CM | POA: Diagnosis not present

## 2016-05-21 DIAGNOSIS — R7309 Other abnormal glucose: Secondary | ICD-10-CM | POA: Diagnosis not present

## 2016-05-21 DIAGNOSIS — Z13220 Encounter for screening for lipoid disorders: Secondary | ICD-10-CM

## 2016-05-21 DIAGNOSIS — D473 Essential (hemorrhagic) thrombocythemia: Secondary | ICD-10-CM

## 2016-05-21 DIAGNOSIS — F5101 Primary insomnia: Secondary | ICD-10-CM

## 2016-05-21 DIAGNOSIS — D75839 Thrombocytosis, unspecified: Secondary | ICD-10-CM

## 2016-05-21 LAB — LIPID PANEL
Cholesterol: 244 mg/dL — ABNORMAL HIGH (ref 0–200)
HDL: 68.5 mg/dL (ref >39.00)
LDL CALC: 158 mg/dL — AB (ref 0–99)
NONHDL: 175.55
Total CHOL/HDL Ratio: 4
Triglycerides: 89 mg/dL (ref 0.0–149.0)
VLDL: 17.8 mg/dL (ref 0.0–40.0)

## 2016-05-21 LAB — HEMOGLOBIN A1C: Hgb A1c MFr Bld: 5.6 % (ref 4.6–6.5)

## 2016-05-21 LAB — CBC
HEMATOCRIT: 40.3 % (ref 36.0–46.0)
HEMOGLOBIN: 13.6 g/dL (ref 12.0–15.0)
MCHC: 33.7 g/dL (ref 30.0–36.0)
MCV: 81.8 fl (ref 78.0–100.0)
Platelets: 464 10*3/uL — ABNORMAL HIGH (ref 150.0–400.0)
RBC: 4.93 Mil/uL (ref 3.87–5.11)
RDW: 15 % (ref 11.5–15.5)
WBC: 8.3 10*3/uL (ref 4.0–10.5)

## 2016-05-21 LAB — COMPREHENSIVE METABOLIC PANEL
ALBUMIN: 4 g/dL (ref 3.5–5.2)
ALT: 12 U/L (ref 0–35)
AST: 15 U/L (ref 0–37)
Alkaline Phosphatase: 52 U/L (ref 39–117)
BUN: 13 mg/dL (ref 6–23)
CHLORIDE: 102 meq/L (ref 96–112)
CO2: 30 meq/L (ref 19–32)
Calcium: 9.3 mg/dL (ref 8.4–10.5)
Creatinine, Ser: 0.63 mg/dL (ref 0.40–1.20)
GFR: 102.41 mL/min (ref >60.00)
Glucose, Bld: 80 mg/dL (ref 70–99)
POTASSIUM: 3.9 meq/L (ref 3.5–5.1)
SODIUM: 139 meq/L (ref 135–145)
Total Bilirubin: 0.4 mg/dL (ref 0.2–1.2)
Total Protein: 7.3 g/dL (ref 6.0–8.3)

## 2016-05-21 LAB — VITAMIN D 25 HYDROXY (VIT D DEFICIENCY, FRACTURES): VITD: 28.02 ng/mL — AB (ref 30.00–100.00)

## 2016-05-21 MED ORDER — LORAZEPAM 1 MG PO TABS: ORAL_TABLET | ORAL | 2 refills | Status: DC

## 2016-05-21 NOTE — Progress Notes (Signed)
Pre visit review using our clinic review tool, if applicable. No additional management support is needed unless otherwise documented below in the visit note. 

## 2016-05-21 NOTE — Progress Notes (Addendum)
[place] [facility] [address] [place], [place] [address] [phone]  Date:  05/21/2016   Name:  Karen Wall   DOB:  1955-10-09   MRN:  [id number]  PCP:  Lamar Blinks, MD    Chief Complaint: Annual Exam   History of Present Illness:  Karen Wall is a 60 y.o. very pleasant female patient who presents with the following:  Here today for a CPE History of thyroid cancer s/p thyroidectomy, low vitamin D and B12 Last complete labs about one year ago- A1c was borderline, TSH is more recent as below Lab Results  Component Value Date   TSH 1.89 03/15/2016   She notes that she feels tired and does not sleep well.  She has felt this way since her thyroid surgery in 06/2013.  She has not yet had a sleep study- she is not aware of any snoring as she sleeps alone.  She notes that she just does not feel the same since her thyroidectomy and suspects that her thyroid is to blame although her TSH is normal  She has not yet had a colonoscopy.  She does have cologuard at home but has not yet done this test.  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   Her last pap was in 2015- normal, negative HPV.  She is not SA  She is still taking vitamin D daily; 2,000 IUD daily.   She uses ativan to get to sleep, but will wake up about 5 hours later.  She is getting 5 hours a night at this point, maybe a little bit more if she does not have to work.  Prior to her thyroidectomy she might sleep 8-9 hours a night.    She is fasting today  She takes ativan "on the nights that I have to sleep some to work the next day"   She has not used much ativan over the last 12 months but does need more now   Patient Active Problem List   Diagnosis Date Noted  . Vitamin D deficiency 03/15/2016  . Low vitamin B12 level 03/15/2016  . Dyspnea 05/28/2014  . Chest tightness  05/28/2014  . 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 GERD (gastroesophageal reflux disease)   . H/O hiatal hernia    "very small "  . Hyperlipidemia   . Numbness    Left side of face around mouth - unknown etiology  . Palpitations    Occurred during hyperthyroid  . Thyroid cancer ([place])   . Thyroid disease    hyperthyroidism    Past Surgical History:  Procedure Laterality Date  . BREAST BIOPSY    . CHOLECYSTECTOMY  1992  . THYROIDECTOMY N/A 07/03/2013   Procedure: TOTAL THYROIDECTOMY;  Surgeon: Earnstine Regal, MD;  Location: [facility];  Service: General;  Laterality: N/A;    Social History  Substance Use Topics  . Smoking status: Never Smoker  . Smokeless tobacco: Never Used  . Alcohol use No    Family History  Problem Relation Age of Onset  . Hypertension Mother   . Diabetes Mother   . Hyperlipidemia Mother   . Mental illness Father   . CAD Father     MI in his 40s  .  Diabetes Father   . Hyperlipidemia Father   . Hypertension Father   . Mental illness Maternal Grandmother   . Stroke Maternal Grandfather     Allergies  Allergen Reactions  . Penicillins     unknown    Medication list has been reviewed and updated.  Current Outpatient Prescriptions on File Prior to Visit  Medication Sig Dispense Refill  . acetaminophen (TYLENOL) 500 MG tablet Take 500 mg by mouth every 6 (six) hours as needed for pain.    . Cyanocobalamin (VITAMIN B-12 PO) Take by mouth.    . IBUPROFEN PO Take 200 mg by mouth as needed.     Marland Kitchen LORazepam (ATIVAN) 1 MG tablet Take 1/2 or 1 tablet at bedtime as needed for insomnia 30 tablet 1  . SYNTHROID 88 MCG tablet TAKE 1 TABLET (88 MCG TOTAL) BY MOUTH DAILY BEFORE BREAKFAST. 90 tablet 3  . Vitamin D, Ergocalciferol, (DRISDOL) 50000 UNITS CAPS capsule Take 1 capsule (50,000 Units total) by mouth every 7 (seven) days. (Patient  not taking: Reported on 05/21/2016) 12 capsule 0   No current facility-administered medications on file prior to visit.     Review of Systems:  As per HPI- otherwise negative.   Physical Examination: Vitals:   05/21/16 0933  BP: 126/90  Pulse: 70  Temp: 97.5 F (36.4 C)   Vitals:   05/21/16 0933  Weight: 200 lb 3.2 oz (90.8 kg)  Height: 5\' 3"  (1.6 m)   Body mass index is 35.46 kg/m. Ideal Body Weight: Weight in (lb) to have BMI = 25: 140.8  GEN: WDWN, NAD, Non-toxic, A & O x 3, obese, looks well otherwise HEENT: Atraumatic, Normocephalic. Neck supple. No masses, No LAD.  Bilateral TM wnl, oropharynx normal.  PEERL,EOMI.   Ears and Nose: No external deformity. CV: RRR, No M/G/R. No JVD. No thrill. No extra heart sounds. PULM: CTA B, no wheezes, crackles, rhonchi. No retractions. No resp. distress. No accessory muscle use. ABD: S, NT, ND. No rebound. No HSM. EXTR: No c/c/e NEURO Normal gait.  PSYCH: Normally interactive. Conversant. Not depressed or anxious appearing.  Calm demeanor.    Assessment and Plan: Physical exam  History of thyroid cancer  Poor sleep - Plan: Comprehensive metabolic panel, Vitamin D (25 hydroxy), LORazepam (ATIVAN) 1 MG tablet, Ambulatory referral to Neurology  Vitamin D deficiency - Plan: Vitamin D (25 hydroxy)  Screening for hyperlipidemia - Plan: Lipid panel  Medication monitoring encounter - Plan: CBC  Elevated hemoglobin A1c - Plan: Hemoglobin A1C  Primary insomnia - Plan: LORazepam (ATIVAN) 1 MG tablet  Here today for a CPE Refilled her ativan and labs pending as above Discussed her fatigue and poor sleep.  She is willing to undergo a sleep study which I will arrange for her today Will plan further follow- up pending labs.   Signed Lamar Blinks, MD  Received her labs as below Results for orders placed or performed in visit on 05/21/16  Comprehensive metabolic panel  Result Value Ref Range   Sodium 139 135 - 145 mEq/L    Potassium 3.9 3.5 - 5.1 mEq/L   Chloride 102 96 - 112 mEq/L   CO2 30 19 - 32 mEq/L   Glucose, Bld 80 70 - 99 mg/dL   BUN 13 6 - 23 mg/dL   Creatinine, Ser 0.63 0.40 - 1.20 mg/dL   Total Bilirubin 0.4 0.2 - 1.2 mg/dL   Alkaline Phosphatase 52 39 - 117 U/L   AST 15 0 - 37  U/L   ALT 12 0 - 35 U/L   Total Protein 7.3 6.0 - 8.3 g/dL   Albumin 4.0 3.5 - 5.2 g/dL   Calcium 9.3 8.4 - 10.5 mg/dL   GFR 102.41 >60.00 mL/min  CBC  Result Value Ref Range   WBC 8.3 4.0 - 10.5 K/uL   RBC 4.93 3.87 - 5.11 Mil/uL   Platelets 464.0 (H) 150.0 - 400.0 K/uL   Hemoglobin 13.6 12.0 - 15.0 g/dL   HCT 40.3 36.0 - 46.0 %   MCV 81.8 78.0 - 100.0 fl   MCHC 33.7 30.0 - 36.0 g/dL   RDW 15.0 11.5 - 15.5 %  Lipid panel  Result Value Ref Range   Cholesterol 244 (H) 0 - 200 mg/dL   Triglycerides 89.0 0.0 - 149.0 mg/dL   HDL 68.50 >39.00 mg/dL   VLDL 17.8 0.0 - 40.0 mg/dL   LDL Cholesterol 158 (H) 0 - 99 mg/dL   Total CHOL/HDL Ratio 4    NonHDL 175.55   Hemoglobin A1C  Result Value Ref Range   Hgb A1c MFr Bld 5.6 4.6 - 6.5 %  Vitamin D (25 hydroxy)  Result Value Ref Range   VITD 28.02 (L) 30.00 - 100.00 ng/mL   10 year cardiac risk calculated at 7.5% mychart message:  It was a pleasure to see you today- please see your labs on mychart. Your A1c is improved- back in normal range-goodnews!  Your vitamin D is minimally low; I would ask you to take an OTC vitamin D supplement with 1,000 or 2,000 IU of vitamin D daily to supplement this.   Your cholesterol is overall acceptable although your LDL is high.  I calculated your 10 year cardiac risk and it is about 7.5%. If you like, cholesterol medication might be helpful for you.  Let me know if you would like to start on a low dose of a statin Finally, I noticed that your platelets have been getting higher. This is non- specific, but as it has been persistent I would suggest that we have you see hematology to make sure there is nothing of concern.  Would this  be ok with you?  Let me know and I will place a referral for you JC

## 2016-05-21 NOTE — Patient Instructions (Signed)
It was good to see you today- I will be in touch with your labs asap We will arrange for a sleep study to rule- out sleep apnea as the cause of your poor sleep Please do your cologuard! Also, I do think that weight training to increase your muscle mass will help boost your metabolism and control your weight.

## 2016-05-23 ENCOUNTER — Encounter: Payer: 59 | Admitting: Family Medicine

## 2016-05-31 MED FILL — SYNTHROID 88 MCG TABLET: 88 | 90 days supply | Qty: 90 | Fill #1

## 2016-05-31 MED FILL — LORazepam 1 MG TABS: 1 | 30 days supply | Qty: 30 | Fill #0

## 2016-07-20 ENCOUNTER — Ambulatory Visit (HOSPITAL_BASED_OUTPATIENT_CLINIC_OR_DEPARTMENT_OTHER): Payer: 59 | Admitting: Hematology & Oncology

## 2016-07-20 ENCOUNTER — Ambulatory Visit: Payer: 59

## 2016-07-20 ENCOUNTER — Other Ambulatory Visit: Payer: Self-pay | Admitting: *Deleted

## 2016-07-20 ENCOUNTER — Other Ambulatory Visit (HOSPITAL_BASED_OUTPATIENT_CLINIC_OR_DEPARTMENT_OTHER): Payer: 59

## 2016-07-20 VITALS — BP 135/82 | HR 71 | Temp 98.2°F | Resp 16 | Wt 199.0 lb

## 2016-07-20 DIAGNOSIS — D75839 Thrombocytosis, unspecified: Secondary | ICD-10-CM

## 2016-07-20 DIAGNOSIS — D473 Essential (hemorrhagic) thrombocythemia: Secondary | ICD-10-CM

## 2016-07-20 DIAGNOSIS — C73 Malignant neoplasm of thyroid gland: Secondary | ICD-10-CM

## 2016-07-20 LAB — CBC WITH DIFFERENTIAL (CANCER CENTER ONLY)
BASO#: 0 10*3/uL (ref 0.0–0.2)
BASO%: 0.4 % (ref 0.0–2.0)
EOS ABS: 0.1 10*3/uL (ref 0.0–0.5)
EOS%: 1.5 % (ref 0.0–7.0)
HCT: 39.9 % (ref 34.8–46.6)
HEMOGLOBIN: 13.2 g/dL (ref 11.6–15.9)
LYMPH#: 2.5 10*3/uL (ref 0.9–3.3)
LYMPH%: 30.4 % (ref 14.0–48.0)
MCH: 27.7 pg (ref 26.0–34.0)
MCHC: 33.1 g/dL (ref 32.0–36.0)
MCV: 84 fL (ref 81–101)
MONO#: 1 10*3/uL — ABNORMAL HIGH (ref 0.1–0.9)
MONO%: 12.8 % (ref 0.0–13.0)
NEUT%: 54.9 % (ref 39.6–80.0)
NEUTROS ABS: 4.5 10*3/uL (ref 1.5–6.5)
Platelets: 468 10*3/uL — ABNORMAL HIGH (ref 145–400)
RBC: 4.77 10*6/uL (ref 3.70–5.32)
RDW: 15 % (ref 11.1–15.7)
WBC: 8.1 10*3/uL (ref 3.9–10.0)

## 2016-07-20 LAB — CHCC SATELLITE - SMEAR

## 2016-07-20 LAB — LACTATE DEHYDROGENASE: LDH: 173 U/L (ref 125–245)

## 2016-07-20 NOTE — Progress Notes (Signed)
Referral MD  Reason for Referral: Thrombocytosis  No chief complaint on file. : My platelets are high.  HPI: Karen Wall is a very charming 61 year old white female. 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 She actually is taking care one of our patients to his upper right now.  She has had a past history of cholecystectomy. This was about 26 years ago. She'll history of thyroidectomy about 4 years ago. She currently is on Synthroid.  She's been noted to have some mild thrombocytosis. She is followed Dr. Lorelei Pont. As always, Dr. Lorelei Pont, being very thorough, has followed her blood counts along for the past 3/2 years. Back in early 2014, her CBC showed a white cell count of 6.7. Hemoglobin 13.5. Plate account 4 and 54,270. MCV was 85.  2 years ago, her platelet count was 468,000. Her white cell count was 9.8 and hemoglobin 13.  About 2 months ago, her CBC showed a white cell count of 8.3. Hemoglobin 13.6. Platelet count 4 68,000. MCV was 82.  Her last ferritin back in 08/2016 was 48.  She's had no obvious bleeding. Again she's never had a colonoscopy. She has had her mammograms.  Her last monthly cycle was about 10 years ago.  She's had no burning in the hands or feet. She's had no rashes. She's had no leg swelling. She's had no weight loss or weight gain. She's had no headache. There's been no visual changes.  There's been no change in her medications.  Overall, her performance status is ECOG 0.     Past Medical History:  Diagnosis Date  . GERD (gastroesophageal reflux disease)   . H/O hiatal hernia    "very small "  . Hyperlipidemia   . Numbness    Left side of face around mouth - unknown etiology  . Palpitations    Occurred during hyperthyroid  . Thyroid cancer ([place])   . Thyroid disease    hyperthyroidism  :  Past Surgical History:  Procedure Laterality Date  . BREAST BIOPSY    . CHOLECYSTECTOMY  1992  . THYROIDECTOMY N/A 07/03/2013   Procedure: TOTAL THYROIDECTOMY;  Surgeon: Earnstine Regal, MD;  Location: [facility];  Service: General;  Laterality: N/A;  :   Current Outpatient Prescriptions:  .  acetaminophen (TYLENOL) 500 MG tablet, Take 500 mg by mouth every 6 (six) hours as needed for pain., Disp: , Rfl:  .  Cholecalciferol (VITAMIN D) 2000 units CAPS, Take 1 capsule by mouth daily., Disp: , Rfl:  .  Cyanocobalamin (VITAMIN B-12 PO), Take by mouth., Disp: , Rfl:  .  IBUPROFEN PO, Take 200 mg by mouth as needed. , Disp: , Rfl:  .  LORazepam (ATIVAN) 1 MG tablet, Take 1/2 or 1 tablet at bedtime as needed for insomnia, Disp: 30 tablet, Rfl: 2 .  SYNTHROID 88 MCG tablet, TAKE 1 TABLET (88 MCG TOTAL) BY MOUTH DAILY BEFORE BREAKFAST., Disp: 90 tablet, Rfl: 3:  :  Allergies  Allergen Reactions  . Penicillins     unknown  :  Family History  Problem Relation Age of Onset  . Hypertension Mother   . Diabetes Mother   . Hyperlipidemia Mother   . Mental illness Father   . CAD Father     MI in his 70s  . Diabetes Father   . Hyperlipidemia Father   . Hypertension Father   . Mental illness Maternal Grandmother   . Stroke Maternal Grandfather   :  Social History   Social History  . Marital status: Divorced    Spouse name: N/A  . Number of children: 1  . Years of education: N/A   Occupational History  . RN    Social History Main Topics  . Smoking status: Never Smoker  . Smokeless tobacco: Never Used  . Alcohol use No  . Drug use: No  . Sexual activity: No   Other Topics Concern  . Not on file   Social History Narrative   Divorced. Education: [employer]. Exercise: Yes.   :  Pertinent items are noted in HPI.  Exam: '@IPVITALS'$ @  Well-developed and well-nourished white female in no obvious distress. Vital signs show a temperature of 98.2. Pulse 71. Blood pressure 135/82. Weight is 199 pounds. Head and neck exam shows no ocular or oral lesions. She has no scleral icterus. She has a well healed thyroidectomy  scar in the lower anterior neck. She has no adenopathy in the neck. Lungs are clear to percussion and auscultation bilaterally. Cardiac exam regular rate and rhythm with no murmurs, rubs or bruits. Abdomen is soft. She has good bowel sounds. There is no fluid wave. There is no palpable liver or spleen tip. Back exam shows no tenderness over the spine, ribs or hips. Extremities shows no clubbing, cyanosis or edema. She has good range of motion of her joints. Neurological exam shows no focal neurological deficits. Skin exam shows no rashes, ecchymoses or petechia.    Recent Labs  07/20/16 1046  WBC 8.1  HGB 13.2  HCT 39.9  PLT 468*   No results for input(s): NA, K, CL, CO2, GLUCOSE, BUN, CREATININE, CALCIUM in the last 72 hours.  Blood smear review:  Normochromic and normocytic population of red blood cells. There might be some slight increase in hypochromic red blood cells. There are no target cells. There are no nucleated red blood cells. I see no teardrop cells. She has no rouleau formation. White cells appear normal in morphology and maturation. There are no hypersegmented polys. There are no immature myeloid or lymphoid forms. Platelets are no we increased in number. Platelets are well granulated. There might be a couple large platelets.  Pathology: None     Assessment and Plan:  Karen Wall is a very charming 61 year old white female. She has minimal thrombocytosis. Her blood smear is unremarkable. Her physical exam is unremarkable.  I am worried about the possibility of iron deficiency. Iron deficiency typically is the most likely cause of thrombocytosis in a postmenopausal female. If this is the case, she clearly will need to have a colonoscopy.  She told me that Dr. Lorelei Pont did a ColoGuard test on her. I told her that this is not the gold standard for iron deficiency in a menopausal patient. She definitely needs a colonoscopy.  I do not see anything that suggests a myeloproliferative  disorder. I do not believe that she needs a bone marrow biopsy.  I would like to see her back in 4 months.  I spent about 45 minutes with her. I gave her a prayer blanket. She has a very strong faith.  I answered all of her questions. She felt very relieved to know that I do not feel that there is any hematologic issues that we would have to do additional studies.  However, I was quite firm in tell her that she needed a colonoscopy. She understands this.

## 2016-07-23 ENCOUNTER — Other Ambulatory Visit: Payer: Self-pay | Admitting: Family

## 2016-07-23 DIAGNOSIS — D508 Other iron deficiency anemias: Secondary | ICD-10-CM

## 2016-07-23 DIAGNOSIS — D509 Iron deficiency anemia, unspecified: Secondary | ICD-10-CM

## 2016-07-23 HISTORY — DX: Iron deficiency anemia, unspecified: D50.9

## 2016-07-23 LAB — FERRITIN: Ferritin: 72 ng/ml (ref 9–269)

## 2016-07-23 LAB — IRON AND TIBC
%SAT: 19 % — AB (ref 21–57)
IRON: 67 ug/dL (ref 41–142)
TIBC: 343 ug/dL (ref 236–444)
UIBC: 276 ug/dL (ref 120–384)

## 2016-07-25 ENCOUNTER — Telehealth: Payer: Self-pay | Admitting: *Deleted

## 2016-07-25 NOTE — Telephone Encounter (Addendum)
Patient aware of results. Appointment made  ----- Message from Eliezer Bottom, NP sent at 07/23/2016 11:45 AM EST ----- Regarding: Iron  Iron saturation low. POF sent to [facility] She will need one dose of IV iron this week please. Thank you!  Karen Wall  ----- Message ----- From: [name], MD Sent: 07/23/2016  11:28 AM To: Eliezer Bottom, NP    ----- Message ----- From: Interface, Lab In Three Zero One Sent: 07/20/2016  11:00 AM To: [name], MD

## 2016-07-26 ENCOUNTER — Ambulatory Visit: Payer: 59

## 2016-07-26 ENCOUNTER — Telehealth: Payer: Self-pay | Admitting: *Deleted

## 2016-07-26 NOTE — Telephone Encounter (Signed)
Patient is scheduled today for iron infusion. She is apprehensive of this infusion as it will cost her around $500 and she is wondering if oral iron may help increase her levels. She has never taken oral iron. She will get the infusion if Dr Marin Olp feels it's necessary, but she is wondering if she can try oral first.  Spoke with Dr Marin Olp and is okay with patient trying oral supplementation first. He wants patient to come back in 4-5 weeks and have levels rechecked to see if oral iron is helpful. Patient understands. She cancelled todays appointment.  Appointment for labs scheduled.

## 2016-08-27 MED FILL — SYNTHROID 88 MCG TABLET: 88 | 90 days supply | Qty: 90 | Fill #2

## 2016-08-31 ENCOUNTER — Other Ambulatory Visit: Payer: Self-pay | Admitting: *Deleted

## 2016-09-03 ENCOUNTER — Other Ambulatory Visit: Payer: 59

## 2016-09-04 ENCOUNTER — Other Ambulatory Visit: Payer: Self-pay | Admitting: *Deleted

## 2016-09-05 ENCOUNTER — Other Ambulatory Visit (HOSPITAL_BASED_OUTPATIENT_CLINIC_OR_DEPARTMENT_OTHER): Payer: 59

## 2016-09-05 DIAGNOSIS — D75839 Thrombocytosis, unspecified: Secondary | ICD-10-CM

## 2016-09-05 DIAGNOSIS — D473 Essential (hemorrhagic) thrombocythemia: Secondary | ICD-10-CM | POA: Diagnosis not present

## 2016-09-05 LAB — IRON AND TIBC
%SAT: 12 % — AB (ref 21–57)
IRON: 41 ug/dL (ref 41–142)
TIBC: 345 ug/dL (ref 236–444)
UIBC: 304 ug/dL (ref 120–384)

## 2016-09-05 LAB — CBC WITH DIFFERENTIAL (CANCER CENTER ONLY)
BASO#: 0 10*3/uL (ref 0.0–0.2)
BASO%: 0.4 % (ref 0.0–2.0)
EOS%: 1.7 % (ref 0.0–7.0)
Eosinophils Absolute: 0.1 10*3/uL (ref 0.0–0.5)
HEMATOCRIT: 40.7 % (ref 34.8–46.6)
HGB: 13.4 g/dL (ref 11.6–15.9)
LYMPH#: 2.3 10*3/uL (ref 0.9–3.3)
LYMPH%: 26.7 % (ref 14.0–48.0)
MCH: 28 pg (ref 26.0–34.0)
MCHC: 32.9 g/dL (ref 32.0–36.0)
MCV: 85 fL (ref 81–101)
MONO#: 0.6 10*3/uL (ref 0.1–0.9)
MONO%: 7.2 % (ref 0.0–13.0)
NEUT#: 5.4 10*3/uL (ref 1.5–6.5)
NEUT%: 64 % (ref 39.6–80.0)
Platelets: 453 10*3/uL — ABNORMAL HIGH (ref 145–400)
RBC: 4.79 10*6/uL (ref 3.70–5.32)
RDW: 15 % (ref 11.1–15.7)
WBC: 8.5 10*3/uL (ref 3.9–10.0)

## 2016-09-05 LAB — CHCC SATELLITE - SMEAR

## 2016-09-05 LAB — FERRITIN: Ferritin: 56 ng/ml (ref 9–269)

## 2016-09-06 ENCOUNTER — Other Ambulatory Visit: Payer: Self-pay | Admitting: Family

## 2016-09-10 ENCOUNTER — Telehealth: Payer: Self-pay | Admitting: *Deleted

## 2016-09-10 NOTE — Telephone Encounter (Addendum)
Patient is aware of results. She will call back to schedule. Message sent to scheduler.   ----- Message from Eliezer Bottom, NP sent at 09/06/2016  1:30 PM EST ----- Regarding: Iron  Needs one dose of IV iron this week or next please. Thank you!  Sarah  ----- Message ----- From: [name], MD Sent: 09/05/2016   7:45 PM To: Eliezer Bottom, NP    ----- Message ----- From: Interface, Lab In Three Zero One Sent: 09/05/2016   9:48 AM To: [name], MD

## 2016-09-17 LAB — CALR MUTATAION(GENPATH)

## 2016-09-24 ENCOUNTER — Telehealth: Payer: Self-pay | Admitting: Hematology & Oncology

## 2016-09-24 NOTE — Telephone Encounter (Signed)
Called patient to schedule her Iron Infusion appt. Left a message for patient to return my call.      [facility] MARCH 2018

## 2016-10-09 ENCOUNTER — Encounter: Payer: Self-pay | Admitting: Family Medicine

## 2016-10-09 DIAGNOSIS — W57XXXA Bitten or stung by nonvenomous insect and other nonvenomous arthropods, initial encounter: Secondary | ICD-10-CM

## 2016-10-09 MED ORDER — DOXYCYCLINE HYCLATE 100 MG PO CAPS: 100.0000 mg | ORAL_CAPSULE | Freq: Two times a day (BID) | ORAL | 0 refills | Status: DC

## 2016-10-09 MED FILL — DOXYCYCLINE HYC 100 MG CAP: 100 | 10 days supply | Qty: 20 | Fill #0

## 2016-10-11 DIAGNOSIS — M76822 Posterior tibial tendinitis, left leg: Secondary | ICD-10-CM | POA: Diagnosis not present

## 2016-11-01 MED FILL — LORazepam 1 MG TABS: 1 | 30 days supply | Qty: 30 | Fill #1

## 2016-11-15 ENCOUNTER — Other Ambulatory Visit: Payer: 59

## 2016-11-15 ENCOUNTER — Ambulatory Visit: Payer: 59 | Admitting: Hematology & Oncology

## 2016-11-26 MED FILL — SYNTHROID 88 MCG TABLET: 88 | 90 days supply | Qty: 90 | Fill #3

## 2017-03-15 ENCOUNTER — Ambulatory Visit (INDEPENDENT_AMBULATORY_CARE_PROVIDER_SITE_OTHER): Payer: 59 | Admitting: Internal Medicine

## 2017-03-15 ENCOUNTER — Encounter: Payer: Self-pay | Admitting: Internal Medicine

## 2017-03-15 ENCOUNTER — Other Ambulatory Visit: Payer: Self-pay | Admitting: Internal Medicine

## 2017-03-15 VITALS — BP 113/70 | HR 69 | Resp 16 | Ht 62.0 in | Wt 214.8 lb

## 2017-03-15 DIAGNOSIS — E559 Vitamin D deficiency, unspecified: Secondary | ICD-10-CM

## 2017-03-15 DIAGNOSIS — E890 Postprocedural hypothyroidism: Secondary | ICD-10-CM | POA: Diagnosis not present

## 2017-03-15 DIAGNOSIS — E538 Deficiency of other specified B group vitamins: Secondary | ICD-10-CM | POA: Diagnosis not present

## 2017-03-15 DIAGNOSIS — C73 Malignant neoplasm of thyroid gland: Secondary | ICD-10-CM | POA: Diagnosis not present

## 2017-03-15 LAB — TSH: TSH: 3.23 u[IU]/mL (ref 0.35–4.50)

## 2017-03-15 LAB — VITAMIN D 25 HYDROXY (VIT D DEFICIENCY, FRACTURES): VITD: 28.43 ng/mL — AB (ref 30.00–100.00)

## 2017-03-15 LAB — VITAMIN B12: VITAMIN B 12: 530 pg/mL (ref 211–911)

## 2017-03-15 LAB — T4, FREE: FREE T4: 0.78 ng/dL (ref 0.60–1.60)

## 2017-03-15 MED ORDER — ARMOUR THYROID 60 MG PO TABS: 60.0000 mg | ORAL_TABLET | Freq: Every day | ORAL | 1 refills | Status: DC

## 2017-03-15 MED FILL — ARMOUR THYROID 60 MG TABLET: 60 | 45 days supply | Qty: 45 | Fill #0

## 2017-03-15 NOTE — Progress Notes (Addendum)
Patient ID: EUGENIE HAREWOOD, female   DOB: [birth year], 61 y.o.   MRN: [id number]   HPI  GERRIE CASTIGLIA is a 61 y.o.-year-old female, returning for f/u for thyroid cancer - follicular variant of PTC and postsurgical hypothyroidism. Last visit 1 year ago.  Reviewed and addended history: In 03/2013, she developed fevers >> TSH checked >> hyperthyroidism (TMNG) >> started MMI >> continued for 1 month.   She also had a thyroid U/S in 03/2013 >> 2 thyroid nodules >> FNA of each in 04/2013 was negative. However, she opted for thyroidectomy for the TMNG.  She had total thyroidectomy 06/2013 >> one focus of follicular variant of PTC of 0.8 cm: Specimen: Thyroid Procedure: Thyroidectomy Specimen Integrity (intact/fragmented): Intact Tumor focality: Unifocal Dominant tumor: Right lobe Maximum tumor size (cm): 0.8 cm Tumor laterality: Right Histologic type (including subtype and/or unique features as applicable): Follicular variant papillary thyroid carcinoma Tumor capsule: N/A Extrathyroidal extension: No Margins: Free of tumor Lymph - Vascular invasion: No Capsular invasion with degree of invasion if present: N/A Lymph nodes: # examined 0; # positive; N/A TNM code: pT1a, pNX Non-neoplastic thyroid: Multinodular goiter. Comments: There is a 0.8 cm nodule of papillary thyroid carcinoma, follicular variant in the right superior lobe which does not involve the margin and is confined within the right lobe. The remainder of the thyroid shows features of multinodular goiter.  Dr Everardo All advised against RAI Tx and against following Thyroglobulin. She then sought a second opinion with me >> I recommended same.  Neck U/S (04/02/2014): No evidence of abnormal soft tissue in the surgical thyroid bed. Nonenlarged cervical lymph nodes identified.  Neck U/S (03/29/2016): Normal post-thyroidectomy appearance. No suspicious lymphadenopathy.  Post-surgical hypothyroidism: Started on 100 mcg  Synthroid  - brand name after sx >> TSH 0.074 >> dose decrease to 50 mcg >> pt felt terrible >> TSH 14.6 >> dose increased to 75 mcg, but TSH returned above goal, at 2.76 >> we increased to 88 mcg daily.  Pt is on Synthroid 88 mcg daily, taken: - in am - fasting - at least 30 min from b'fast - no Ca, MVI, PPIs - + added iron 3x a day 7 mo ago >> takes this 4-5h later - not on Biotin  I reviewed pt's thyroid tests: Lab Results  Component Value Date   TSH 1.89 03/15/2016   TSH 1.57 03/07/2015   TSH 1.12 08/24/2014   TSH 1.64 06/03/2014   TSH 2.76 01/19/2014   TSH 1.42 10/05/2013   TSH 14.627 (H) 08/31/2013   TSH 0.074 (L) 08/06/2013   TSH 10.64 (H) 06/23/2013   TSH 0.014 (L) 05/12/2013   FREET4 1.07 03/15/2016   FREET4 0.96 03/07/2015   FREET4 1.14 08/24/2014   FREET4 1.08 06/03/2014   FREET4 0.86 01/19/2014   FREET4 0.93 10/05/2013   FREET4 0.37 (L) 06/23/2013   FREET4 0.80 05/12/2013   FREET4 1.54 04/09/2013   Pt denies: - feeling nodules in neck - she does have occas. hoarseness - dysphagia - choking - SOB with lying down  She still c/o fatigue >> would like to try to add T3.  She had low vitamin D before: Lab Results  Component Value Date   VD25OH 28.02 (L) 05/21/2016   VD25OH 19.16 (L) 03/15/2016   VD25OH 25 (L) 05/18/2015   VD25OH 18.97 (L) 08/24/2014   She was on Ergocalciferol 50,000 IU once a week, now on OTC vit D 5000 IU daily.  She also had a low vitamin B12 In  the past,At last check, however, level was high.  Lab Results  Component Value Date   VITAMINB12 1,228 (H) 05/18/2015   OVZCHYIF02 237 03/07/2015   VITAMINB12 252 08/24/2014   She is taking 1000 g vitamin B12 sublingual - every 3rd day.  ROS: Constitutional: + weight gain/no weight loss, + fatigue, no subjective hyperthermia, no subjective hypothermia Eyes: no blurry vision, no xerophthalmia ENT: no sore throat, + see HPI Cardiovascular: no CP/+SOB/no palpitations/+ leg  swelling Respiratory: no cough/no SOB/no wheezing Gastrointestinal: no N/no V/no D/+ C/no acid reflux Musculoskeletal: no muscle aches/+ joint aches Skin: no rashes, no hair loss Neurological: no tremors/no numbness/no tingling/no dizziness + forgetfulness  I reviewed pt's medications, allergies, PMH, social hx, family hx, and changes were documented in the history of present illness. Otherwise, unchanged from my initial visit note.  PE: BP 113/70   Pulse 69   Resp 16   Ht '5\' 2"'$  (1.575 m)   Wt 214 lb 12.8 oz (97.4 kg)   SpO2 98%   BMI 39.29 kg/m  Body mass index is 39.29 kg/m. Wt Readings from Last 3 Encounters:  03/15/17 214 lb 12.8 oz (97.4 kg)  07/20/16 199 lb (90.3 kg)  05/21/16 200 lb 3.2 oz (90.8 kg)   Constitutional: obese, in NAD Eyes: PERRLA, EOMI, no exophthalmos ENT: moist mucous membranes, no thyroid masses, no cervical lymphadenopathy Cardiovascular: RRR, No MRG Respiratory: CTA B Gastrointestinal: abdomen soft, NT, ND, BS+ Musculoskeletal: no deformities, strength intact in all 4 Skin: moist, warm, no rashes Neurological: no tremor with outstretched hands, DTR normal in all 4  ASSESSMENT: 1. Thyroid cancer - Papillary Thy Ca, follicular variant  - subcm, w/o extension to capsule, lymph or blood vessel, and w/o neck extension - s/p total thyroidectomy in 06/2013  2. Postsurgical hypothyroidism  3. Vitamin D deficiency  4. Low vitamin B12   PLAN: 1. PTC - we again discussed that FV of PTC is the least aggressive PTC type >> downgraded by latest guidelines to a benign tumor - no neck compression sxs, nocervical LAD on palpation. She occasionally has hoarseness. - reviewed latest thyroid U/S >> last in 2017 >> no ThyCA recurrence  2. Post-surgical hypothyroidism - latest thyroid labs reviewed with pt >> normal  - she continues on Synthroid 88 mcg daily, but would like to try T3 >> discussed different formulations >> will try Armour - pt feels good on  this dose. - we discussed about taking the thyroid hormone every day, with water, >30 minutes before breakfast, separated by >4 hours from acid reflux medications, calcium, iron, multivitamins. Pt. is taking it correctly - will check thyroid tests today: TSH and fT4 - will return for repeat TFTs in 1.5 months as we change Armour - Return to clinic in 4 mo  3. And 4. Vitamin D deficiency and low vitamin B12 - we checked these in the past 2/2 fatigue (started after thyroidectomy, despite normal TFTs) - at previous visits, we checked vitamin D (low >> was on supplementation with high-dose vitamin D, however, she stopped), vitamin B12 (low normal >> she takes by mouth B12), ESR (slightly high), and ferritin (normal) - will check B12 and vit D today  Component     Latest Ref Rng & Units 03/15/2017  TSH     0.35 - 4.50 uIU/mL 3.23  T4,Free(Direct)     0.60 - 1.60 ng/dL 0.78  VITD     30.00 - 100.00 ng/mL 28.43 (L)  Vitamin B12  211 - 911 pg/mL 530   Vitamin D is still low. I will advise her to increase her vitamin D dose to 7000 units a day. Vitamin B12 level is normal. TFTs are normal. We'll switch to Armour 60 mg daily (equivalent of 100 g levothyroxine daily). Will recheck TFTs in 1.5 months.  Component     Latest Ref Rng & Units 04/24/2017  TSH     0.35 - 4.50 uIU/mL 9.64 (H)  T4,Free(Direct)     0.60 - 1.60 ng/dL 0.52 (L)  Triiodothyronine,Free,Serum     2.3 - 4.2 pg/mL 2.6  VITD     30.00 - 100.00 ng/mL 28.16 (L)   TSH higher >> need to increase Armour to 60 alternating with 90 every other day and check TFts in 1.5 mo. Vit D still low >> will increase dose to 10,000 units daily and recheck with next set of TFTs.  Philemon Kingdom, MD PhD [facility]

## 2017-03-15 NOTE — Patient Instructions (Signed)
Please stop at the lab.  Schedule a new appt (for labs) in 6 weeks.  Take the thyroid hormone every day, with water, at least 30 minutes before breakfast, separated by at least 4 hours from: - acid reflux medications - calcium - iron - multivitamins  Please come back for a follow-up appointment in 4 months.

## 2017-04-23 MED FILL — ARMOUR THYROID 60 MG TABLET: 60 | 45 days supply | Qty: 45 | Fill #1

## 2017-04-24 ENCOUNTER — Other Ambulatory Visit (INDEPENDENT_AMBULATORY_CARE_PROVIDER_SITE_OTHER): Payer: 59

## 2017-04-24 DIAGNOSIS — E890 Postprocedural hypothyroidism: Secondary | ICD-10-CM

## 2017-04-24 DIAGNOSIS — E559 Vitamin D deficiency, unspecified: Secondary | ICD-10-CM

## 2017-04-24 LAB — VITAMIN D 25 HYDROXY (VIT D DEFICIENCY, FRACTURES): VITD: 28.16 ng/mL — ABNORMAL LOW (ref 30.00–100.00)

## 2017-04-24 LAB — T4, FREE: Free T4: 0.52 ng/dL — ABNORMAL LOW (ref 0.60–1.60)

## 2017-04-24 LAB — T3, FREE: T3 FREE: 2.6 pg/mL (ref 2.3–4.2)

## 2017-04-24 LAB — TSH: TSH: 9.64 u[IU]/mL — AB (ref 0.35–4.50)

## 2017-04-24 MED ORDER — ARMOUR THYROID 60 MG PO TABS: 60.0000 mg | ORAL_TABLET | ORAL | 1 refills | Status: DC

## 2017-04-24 MED ORDER — ARMOUR THYROID 90 MG PO TABS: 90.0000 mg | ORAL_TABLET | ORAL | 1 refills | Status: DC

## 2017-04-24 NOTE — Addendum Note (Signed)
Addended by: Philemon Kingdom on: 04/24/2017 01:43 PM   Modules accepted: Orders

## 2017-04-25 ENCOUNTER — Encounter: Payer: Self-pay | Admitting: Internal Medicine

## 2017-04-25 ENCOUNTER — Other Ambulatory Visit: Payer: Self-pay

## 2017-04-25 MED ORDER — ARMOUR THYROID 90 MG PO TABS: 90.0000 mg | ORAL_TABLET | ORAL | 1 refills | Status: DC

## 2017-04-25 MED FILL — ARMOUR THYROID 90 MG TABLET: 90 | 60 days supply | Qty: 30 | Fill #0

## 2017-06-03 ENCOUNTER — Other Ambulatory Visit (INDEPENDENT_AMBULATORY_CARE_PROVIDER_SITE_OTHER): Payer: 59

## 2017-06-03 DIAGNOSIS — E559 Vitamin D deficiency, unspecified: Secondary | ICD-10-CM | POA: Diagnosis not present

## 2017-06-03 DIAGNOSIS — E890 Postprocedural hypothyroidism: Secondary | ICD-10-CM | POA: Diagnosis not present

## 2017-06-03 LAB — VITAMIN D 25 HYDROXY (VIT D DEFICIENCY, FRACTURES): VITD: 30.59 ng/mL (ref 30.00–100.00)

## 2017-06-03 LAB — TSH: TSH: 3.42 u[IU]/mL (ref 0.35–4.50)

## 2017-06-03 LAB — T3, FREE: T3 FREE: 3 pg/mL (ref 2.3–4.2)

## 2017-06-03 LAB — T4, FREE: Free T4: 0.52 ng/dL — ABNORMAL LOW (ref 0.60–1.60)

## 2017-06-12 ENCOUNTER — Ambulatory Visit: Payer: 59 | Admitting: Podiatry

## 2017-06-12 ENCOUNTER — Encounter: Payer: Self-pay | Admitting: Podiatry

## 2017-06-12 VITALS — BP 130/93 | HR 84 | Ht 62.0 in | Wt 200.0 lb

## 2017-06-12 DIAGNOSIS — M722 Plantar fascial fibromatosis: Secondary | ICD-10-CM | POA: Diagnosis not present

## 2017-06-12 DIAGNOSIS — M659 Synovitis and tenosynovitis, unspecified: Secondary | ICD-10-CM | POA: Diagnosis not present

## 2017-06-12 DIAGNOSIS — M21969 Unspecified acquired deformity of unspecified lower leg: Secondary | ICD-10-CM | POA: Diagnosis not present

## 2017-06-12 DIAGNOSIS — M216X1 Other acquired deformities of right foot: Secondary | ICD-10-CM | POA: Diagnosis not present

## 2017-06-12 DIAGNOSIS — M216X2 Other acquired deformities of left foot: Secondary | ICD-10-CM | POA: Diagnosis not present

## 2017-06-12 NOTE — Progress Notes (Signed)
SUBJECTIVE: 61 y.o. year old female presents complaining of pain in both feet, heels, and right instep area duration of a month. Been doing some stretch exercise. Has had this problem a year ago more on left side that has been subsided. Now the right foot is far more painful at this time.  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  Hx. Of Thyroid cancer 2014.  Review of Systems  Constitutional: Negative.   HENT: Negative.   Eyes: Negative.   Respiratory: Negative.   Gastrointestinal: Negative.   Genitourinary: Negative.   Musculoskeletal: Negative.        Having occasional anterior hip pain left side.  Skin: Negative.   Neurological: Negative.     OBJECTIVE: DERMATOLOGIC EXAMINATION: No abnormal findings.  VASCULAR EXAMINATION OF LOWER LIMBS: Pedal pulses: All pedal pulses are palpable with normal pulsation.  No edema or erythema noted. Temperature gradient from tibial crest to dorsum of foot is within normal bilateral.  NEUROLOGIC EXAMINATION OF THE LOWER LIMBS: All epicritic and tactile sensations grossly intact. Sharp and Dull discriminatory sensations at the plantar ball of hallux is intact bilateral.   MUSCULOSKELETAL EXAMINATION: Tight Achilles tendon right. Hypermobile first ray bilateral L>R. STJ hyperpronation bilateral. Pain in both heels. Tendonitis right posterior tibial right >left. Lesser metatarsal joint pain with prolonged weight bearing.  ASSESSMENT: Hypermobile first ray bilateral. Achilles tendon contracture right. STJ hyperpronation bilatereal. Posterior tibial tendonitis right. Lesser metatarsalgia bilateral. Plantar fasciitis bilateral.  PLAN: Reviewed clinical findings and available treatment options. Reviewed stretch exercise for tight Achilles tendon right. Both feet placed in ankle brace. This helped her reducing foot pain with weight bearing. May benefit from custom orthotics.

## 2017-06-12 NOTE — Patient Instructions (Signed)
Seen for painful feet. Noted of weak first metatarsal bone, pronating rearfoot, and tight Achilles tendon on right. Reviewed stretch exercise for right foot. Both feet placed in Ankle brace. Need to prepare custom orthotics. Return in one week.

## 2017-06-14 ENCOUNTER — Telehealth: Payer: Self-pay | Admitting: *Deleted

## 2017-06-14 NOTE — Telephone Encounter (Signed)
06/14/17 Patient called this afternoon and said she is wearing both ankle braces. Her ankle feels some better and her knees don't hurt but the left knee feels loose?? She just wanted me to mention this to you Dr. Caffie Pinto. She will be in on next Wednesday to be casted for Orthotics.

## 2017-06-18 ENCOUNTER — Other Ambulatory Visit: Payer: Self-pay | Admitting: Family Medicine

## 2017-06-18 ENCOUNTER — Ambulatory Visit (HOSPITAL_BASED_OUTPATIENT_CLINIC_OR_DEPARTMENT_OTHER)
Admission: RE | Admit: 2017-06-18 | Discharge: 2017-06-18 | Disposition: A | Discharge status: Home / Self Care | Payer: 59 | Source: Ambulatory Visit | Attending: Family Medicine | Admitting: Family Medicine

## 2017-06-18 ENCOUNTER — Encounter: Payer: Self-pay | Admitting: Family Medicine

## 2017-06-18 DIAGNOSIS — Z1231 Encounter for screening mammogram for malignant neoplasm of breast: Secondary | ICD-10-CM | POA: Insufficient documentation

## 2017-06-18 MED FILL — ARMOUR THYROID 90 MG TABLET: 90 | 60 days supply | Qty: 30 | Fill #1

## 2017-06-19 ENCOUNTER — Encounter: Payer: Self-pay | Admitting: Podiatry

## 2017-06-19 ENCOUNTER — Ambulatory Visit: Payer: 59 | Admitting: Podiatry

## 2017-06-19 ENCOUNTER — Telehealth: Payer: Self-pay

## 2017-06-19 DIAGNOSIS — M216X1 Other acquired deformities of right foot: Secondary | ICD-10-CM

## 2017-06-19 DIAGNOSIS — M722 Plantar fascial fibromatosis: Secondary | ICD-10-CM | POA: Diagnosis not present

## 2017-06-19 DIAGNOSIS — M659 Synovitis and tenosynovitis, unspecified: Secondary | ICD-10-CM

## 2017-06-19 DIAGNOSIS — M216X2 Other acquired deformities of left foot: Secondary | ICD-10-CM | POA: Diagnosis not present

## 2017-06-19 DIAGNOSIS — M21969 Unspecified acquired deformity of unspecified lower leg: Secondary | ICD-10-CM

## 2017-06-19 NOTE — Progress Notes (Signed)
Subjective: 61 y.o. year old female patient presents complaining of pain in left foot and request for custom orthotics. She was seen on 11/28//18 and treated with ankle brace. Stated that the ankle brace provided her minimum relief.   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 Hx. Of Thyroid cancer 2014.  Objective: Dermatologic: No abnormal findings. Vascular: Pedal pulses are all palpable. Orthopedic: Tight Achilles tendon on right. Hypermobile first ray bilateral L>R. Pain in both heels and instep area right along the course of Tibialis posterior tendon insertion area. Lesser metatarsal joint pain with prolonged weight bearing. Neurologic: All epicritic and tactile sensations grossly intact.  Radiographic exam: Adducted metatarsal with enlarged medial eminence first metatarsal. Increased lateral deviation angle of CCJ bilateral. Lateral view: Severe anterior advancement of CYMA line, plantar calcaneal spur, elevated first metatarsal bilateral L>R. Positive of midtarsal sagging bilateral.  Assessment: Posterior tibialis tendonitis right. Plantar fascitis bilateral. Lesser metatarsalgia bilateral. Hypermobile first ray with STJ hyperpronation. Achilles tendon contracture right.   Treatment: Reviewed clinical findings and available treatment options. Right instep near the course of Posterior tibialis tendon injected with mixture of 4 mg Dexamethasone, 4 mg Triamcinolone, and 1 cc of 0.5% Marcaine plain. Patient tolerated well without difficulty.  Both feet casted for orthotics.  Will place in CAM walker if pain continues.

## 2017-06-19 NOTE — Patient Instructions (Signed)
Seen for pain in right arch. Both feet casted for orthotics. Cortisone injection given to right instep along the course of Posterior tibialis right foot.

## 2017-06-19 NOTE — Telephone Encounter (Signed)
Pt needing new kit- old expired. Order placed through [employer] portal. Order number: [id number].

## 2017-06-20 ENCOUNTER — Encounter: Payer: Self-pay | Admitting: Podiatry

## 2017-06-27 ENCOUNTER — Ambulatory Visit (INDEPENDENT_AMBULATORY_CARE_PROVIDER_SITE_OTHER): Payer: 59 | Admitting: Podiatry

## 2017-06-27 DIAGNOSIS — M216X2 Other acquired deformities of left foot: Secondary | ICD-10-CM | POA: Diagnosis not present

## 2017-06-27 DIAGNOSIS — M21969 Unspecified acquired deformity of unspecified lower leg: Secondary | ICD-10-CM | POA: Diagnosis not present

## 2017-06-27 DIAGNOSIS — M216X1 Other acquired deformities of right foot: Secondary | ICD-10-CM

## 2017-06-27 DIAGNOSIS — M722 Plantar fascial fibromatosis: Secondary | ICD-10-CM | POA: Diagnosis not present

## 2017-06-27 DIAGNOSIS — M659 Synovitis and tenosynovitis, unspecified: Secondary | ICD-10-CM | POA: Diagnosis not present

## 2017-06-27 NOTE — Patient Instructions (Signed)
Seen for pain in right foot, unresolved with injection, ankle brace, NSAIA. Advised to refrain from 12 hour shift and stay off of feet for the next 4 weeks.

## 2017-06-27 NOTE — Progress Notes (Signed)
Subjective: 61 y.o. year old female patient presents stating that Cortisone injection helped after the first night. After 2-3 days the pain returned. Pain is at instep and ankle area right foot.  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 Hx. Of Thyroid cancer 2014.  Objective: Dermatologic: Normal findings. Vascular: Pedal pulses are all palpable. Orthopedic: Tight Achilles tendon on right, hypermobile first ray bilateral L>R, pain at instep on right and heels of both feet. Pain at Posterior tibialis tendon insertion area right, lesser metatarsal joint pain with increased weight bearing bilateral. Neurologic: All epicritic and tactile sensations grossly intact.  Assessment: Posterior tibialis tendonitis right. Plantar fasciitis bilateral. Lesser metatarsalgia bilateral. Hypermobile first ray bilateral with STJ hyperpronation. Achilles tendon contracture right.  Treatment: Patient picked up CAM walker which was advised to her during her last visit. Reviewed available treatment options, extended rest using CAM walker or using ankle brace. Patient wishes to follow through with the recommendation. Short term leave discussed for at least 4 weeks at this time and letter dispensed.

## 2017-06-28 ENCOUNTER — Encounter: Payer: Self-pay | Admitting: Podiatry

## 2017-07-03 ENCOUNTER — Telehealth: Payer: Self-pay

## 2017-07-03 NOTE — Telephone Encounter (Signed)
Pt called stating that her employer will be sending STD paperwork. She provided claim number [id number]

## 2017-07-11 NOTE — Telephone Encounter (Signed)
STD faxed to [facility] [employer] [facility]. FMLA paperwork faxed to [employer]. Copies sent to scanning.

## 2017-07-17 ENCOUNTER — Encounter: Payer: Self-pay | Admitting: Internal Medicine

## 2017-07-17 ENCOUNTER — Ambulatory Visit (INDEPENDENT_AMBULATORY_CARE_PROVIDER_SITE_OTHER): Payer: 59 | Admitting: Internal Medicine

## 2017-07-17 VITALS — BP 140/92 | HR 87 | Ht 62.0 in | Wt 214.8 lb

## 2017-07-17 DIAGNOSIS — E538 Deficiency of other specified B group vitamins: Secondary | ICD-10-CM | POA: Diagnosis not present

## 2017-07-17 DIAGNOSIS — C73 Malignant neoplasm of thyroid gland: Secondary | ICD-10-CM

## 2017-07-17 DIAGNOSIS — R7989 Other specified abnormal findings of blood chemistry: Secondary | ICD-10-CM

## 2017-07-17 DIAGNOSIS — E559 Vitamin D deficiency, unspecified: Secondary | ICD-10-CM

## 2017-07-17 DIAGNOSIS — E890 Postprocedural hypothyroidism: Secondary | ICD-10-CM

## 2017-07-17 LAB — TSH: TSH: 3.76 u[IU]/mL (ref 0.35–4.50)

## 2017-07-17 LAB — T4, FREE: Free T4: 0.58 ng/dL — ABNORMAL LOW (ref 0.60–1.60)

## 2017-07-17 LAB — T3, FREE: T3, Free: 3 pg/mL (ref 2.3–4.2)

## 2017-07-17 NOTE — Patient Instructions (Signed)
Please stop at the lab.  Decrease the Armour to 60 mg daily.  Take the thyroid hormone every day, with water, at least 30 minutes before breakfast, separated by at least 4 hours from: - acid reflux medications - calcium - iron - multivitamins  Please come back for a follow-up appointment in 6 months

## 2017-07-17 NOTE — Progress Notes (Signed)
Patient ID: Karen Wall, female   DOB: September 20, 1955, 62 y.o.   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 Last visit 4 mo ago.  Since last visit, she had more pain in her ankles (tendinitis) and she is now wearing 2 splints.  Her knee started to hurt now.  She may need to have ankle surgery.  Reviewed and addended hx: In 03/2013, she developed fevers >> TSH checked >> hyperthyroidism (TMNG) >> started MMI >> continued for 1 month.   She also had a thyroid U/S in 03/2013 >> 2 thyroid nodules >> FNA of each in 04/2013 was negative. However, she opted for thyroidectomy for the TMNG.  She had total thyroidectomy 06/2013 >> one focus of follicular variant of PTC of 0.8 cm: Specimen: Thyroid Procedure: Thyroidectomy Specimen Integrity (intact/fragmented): Intact Tumor focality: Unifocal Dominant tumor: Right lobe Maximum tumor size (cm): 0.8 cm Tumor laterality: Right Histologic type (including subtype and/or unique features as applicable): Follicular variant papillary thyroid carcinoma Tumor capsule: N/A Extrathyroidal extension: No Margins: Free of tumor Lymph - Vascular invasion: No Capsular invasion with degree of invasion if present: N/A Lymph nodes: # examined 0; # positive; N/A TNM code: pT1a, pNX Non-neoplastic thyroid: Multinodular goiter. Comments: There is a 0.8 cm nodule of papillary thyroid carcinoma, follicular variant in the right superior lobe which does not involve the margin and is confined within the right lobe. The remainder of the thyroid shows features of multinodular goiter.  Dr Loanne Drilling advised against RAI Tx and against following Thyroglobulin. She then sought a second opinion with me >> I recommended same.  Neck U/S (04/02/2014): No evidence of abnormal soft tissue in the surgical thyroid bed. Nonenlarged cervical  lymph nodes identified.  Neck U/S (03/29/2016): Normal post-thyroidectomy appearance. No suspicious lymphadenopathy.  Post-surgical hypothyroidism: Started on 100 mcg Synthroid  - brand name after sx >> TSH 0.074 >> dose decrease to 50 mcg >> pt felt terrible >> TSH 14.6 >> dose increased to 75 mcg, but TSH returned above goal, at 2.76 >> we increased to 88 mcg daily. However, We added T3 to help with fatigue (Armour) 02/2017 >> now on 60 and 90 mg every other day.   After we increased her Armour dose, she started to have a rash, palpitations, SOB, some panic attacks. OTW, she feels more energetic and less depressed.  She takes Armour: - in am - fasting - at least 30 min from b'fast - no Ca (only occas, Tums), Fe, MVI, PPIs - not on Biotin  I reviewed pt's thyroid tests: Lab Results  Component Value Date   TSH 3.42 06/03/2017   TSH 9.64 (H) 04/24/2017   TSH 3.23 03/15/2017   TSH 1.89 03/15/2016   TSH 1.57 03/07/2015   TSH 1.12 08/24/2014   TSH 1.64 06/03/2014   TSH 2.76 01/19/2014   TSH 1.42 10/05/2013   TSH 14.627 (H) 08/31/2013   FREET4 0.52 (L) 06/03/2017   FREET4 0.52 (L) 04/24/2017   FREET4 0.78 03/15/2017   FREET4 1.07 03/15/2016   FREET4 0.96 03/07/2015   FREET4 1.14 08/24/2014   FREET4 1.08 06/03/2014   FREET4 0.86 01/19/2014   FREET4 0.93 10/05/2013   FREET4 0.37 (L) 06/23/2013   Pt denies: - feeling nodules in neck - hoarseness - dysphagia - choking - SOB with lying down  She had low vitamin D before, but now normal  level on daily vit D.: Lab Results  Component Value Date   VD25OH 30.59 06/03/2017   VD25OH 28.16 (L) 04/24/2017   VD25OH 28.43 (L) 03/15/2017   VD25OH 28.02 (L) 05/21/2016   VD25OH 19.16 (L) 03/15/2016   VD25OH 25 (L) 05/18/2015   VD25OH 18.97 (L) 08/24/2014   She was on Ergocalciferol 50,000 IU once a week, then OTC vit D 5000 IU daily.  She also had a low vitamin B12 In the past, Last level normal. Lab Results  Component Value  Date   VITAMINB12 530 03/15/2017   VITAMINB12 1,228 (H) 05/18/2015   VITAMINB12 237 03/07/2015   VITAMINB12 252 08/24/2014   She is taking 1000 g vitamin B12 sublingual - every 3rd daily.  ROS: Constitutional: no weight gain/no weight loss, + fatigue, no subjective hyperthermia, no subjective hypothermia Eyes: no blurry vision, no xerophthalmia ENT: no sore throat, + see HPI Cardiovascular: no CP/+ SOB/+ palpitations/no leg swelling Respiratory: no cough/+ SOB/no wheezing Gastrointestinal: no N/no V/no D/no C/no acid reflux Musculoskeletal: no muscle aches/+ joint aches >> both ankles splinted Skin: + Rash on chest and back, no hair loss Neurological: no tremors/no numbness/no tingling/no dizziness  I reviewed pt's medications, allergies, PMH, social hx, family hx, and changes were documented in the history of present illness. Otherwise, unchanged from my initial visit note.  She stopped iron since last visit.  PE: BP (!) 140/92   Pulse 87   Ht '5\' 2"'$  (1.575 m)   Wt 214 lb 12.8 oz (97.4 kg)   SpO2 97%   BMI 39.29 kg/m  Body mass index is 39.29 kg/m. Wt Readings from Last 3 Encounters:  07/17/17 214 lb 12.8 oz (97.4 kg)  06/12/17 200 lb (90.7 kg)  03/15/17 214 lb 12.8 oz (97.4 kg)   Constitutional: overweight, in NAD Eyes: PERRLA, EOMI, no exophthalmos ENT: moist mucous membranes, thyroidectomy scar healed, no neck masses palpated, no cervical lymphadenopathy Cardiovascular: RRR, No MRG Respiratory: CTA B Gastrointestinal: abdomen soft, NT, ND, BS+ Musculoskeletal: no deformities, strength intact in all 4 Skin: moist, warm, plus papular rash on chest and back Neurological: no tremor with outstretched hands, DTR normal in all 4  ASSESSMENT: 1. Thyroid cancer - Papillary Thy Ca, follicular variant  - subcm, w/o extension to capsule, lymph or blood vessel, and without neck extension - s/p total thyroidectomy in 06/2013  2. Postsurgical hypothyroidism  3. Vitamin D  deficiency  4. Low vitamin B12   PLAN: 1. PTC - Patient has a history of FV of PTC which is the least aggressive PTC type>> downgraded by latest guidelines to a benign tumor - She has no neck compression symptoms, no neck masses palpated - Reviewed the latest thyroid ultrasound from 2017 >> no thyroid cancer recurrence  2. Post-surgical hypothyroidism - latest thyroid labs reviewed with pt >> normal, after increasing the dose of Armour from 60 mg daily to 60 alternating with 90 mg every other day.  She is feeling more energetic on this regimen, however, she also describes more anxiety, palpitations, and she has a rash on her chest and back which started around the time that we increase her dose. - We discussed about repeating the tests today: TSH, free T4, free T3, and if they are abnormal to decrease the dose of Armour, however, if they are normal, to change her to [facility] thyroid or Nature-Throid, which are more pure.  Alternatively, we can try levothyroxine and liothyronine combination. - we discussed about taking the thyroid hormone every day,  with water, >30 minutes before breakfast, separated by >4 hours from acid reflux medications, calcium, iron, multivitamins. Pt. is taking it correctly - RTC in 6 mo  3. And 4. Vitamin D deficiency and low vitamin B12 - We checked these in the past due to fatigue (started after thyroidectomy, despite normal TFTs) - at previous visits, we checked vitamin D (low >> was on supplementation with high-dose vitamin D, however, she stopped), vitamin B12 (low normal >> she takes by mouth B12), ESR (slightly high), and ferritin (normal) - Reviewed together her latest levels for B12 and vitamin D and they were both  normal.  Continue current supplement doses.  Component     Latest Ref Rng & Units 07/17/2017  TSH     0.35 - 4.50 uIU/mL 3.76  T4,Free(Direct)     0.60 - 1.60 ng/dL 0.58 (L)  Triiodothyronine,Free,Serum     2.3 - 4.2 pg/mL 3.0   TSH is not low >>  there is no room for adjusting the Armour dose, so I would suggest to try Nature-Throid 81.25 mg, which is more pure. We will need to repeat her TFTs in 1.5 months after making the change.  Philemon Kingdom, MD PhD [facility] Endocrinology

## 2017-07-18 ENCOUNTER — Encounter: Payer: Self-pay | Admitting: Family Medicine

## 2017-07-18 DIAGNOSIS — Z1211 Encounter for screening for malignant neoplasm of colon: Secondary | ICD-10-CM | POA: Diagnosis not present

## 2017-07-18 MED ORDER — NATURE-THROID 81.25 MG PO TABS: ORAL_TABLET | ORAL | 5 refills | Status: DC

## 2017-07-19 ENCOUNTER — Other Ambulatory Visit: Payer: Self-pay | Admitting: Internal Medicine

## 2017-07-19 ENCOUNTER — Telehealth: Payer: Self-pay | Admitting: Internal Medicine

## 2017-07-19 LAB — COLOGUARD

## 2017-07-19 MED ORDER — NP THYROID 15 MG PO TABS: 15.0000 mg | ORAL_TABLET | Freq: Every day | ORAL | 1 refills | Status: DC

## 2017-07-19 MED ORDER — NP THYROID 60 MG PO TABS: 60.0000 mg | ORAL_TABLET | Freq: Every day | ORAL | 1 refills | Status: DC

## 2017-07-19 MED ORDER — LIOTHYRONINE SODIUM 5 MCG PO TABS: 5.0000 ug | ORAL_TABLET | Freq: Every day | ORAL | 1 refills | Status: DC

## 2017-07-19 MED ORDER — LEVOTHYROXINE SODIUM 100 MCG PO TABS: 100.0000 ug | ORAL_TABLET | Freq: Every day | ORAL | 1 refills | Status: DC

## 2017-07-19 MED FILL — LIOTHYRONINE SODIUM 5 MCG T: 5 | 45 days supply | Qty: 45 | Fill #0

## 2017-07-19 MED FILL — LEVOTHYROXINE 100 MCG TAB: 100 | 45 days supply | Qty: 45 | Fill #0

## 2017-07-19 NOTE — Telephone Encounter (Signed)
Pt is calling around and will let us know

## 2017-07-19 NOTE — Telephone Encounter (Signed)
They can not get the NP Thyroid, they stated that can only get the Levothyrinine and Synthroid.

## 2017-07-19 NOTE — Telephone Encounter (Signed)
Pt states that no pharmacies have this med, it is on permanent backorder it is not being made at this time. Please change med

## 2017-07-19 NOTE — Telephone Encounter (Signed)
Karen Wall 6 minutes ago (9:43 AM)     Pt states that no pharmacies have this med, it is on permanent backorder it is not being made at this time. Please change med       Please advise

## 2017-07-19 NOTE — Telephone Encounter (Signed)
OK, I sent LT4 and LT3.

## 2017-07-19 NOTE — Telephone Encounter (Signed)
Pt has four days left of her current thyroid med, the nature thyroid that is called in yesterday is on permanent backorder please advise and call a different rx into [facility]

## 2017-07-19 NOTE — Telephone Encounter (Signed)
Can she maybe try a different pharmacy?

## 2017-07-19 NOTE — Telephone Encounter (Signed)
Pt is aware.  

## 2017-07-19 NOTE — Telephone Encounter (Signed)
Please advise on below  

## 2017-07-19 NOTE — Telephone Encounter (Signed)
I changed it to NP thyroid.  Can you please check with the pharmacist whether they have this in stock, I would like to make sure that she has her thyroid medication before the weekend.

## 2017-07-24 ENCOUNTER — Ambulatory Visit: Payer: 59 | Admitting: Podiatry

## 2017-07-24 DIAGNOSIS — M659 Synovitis and tenosynovitis, unspecified: Secondary | ICD-10-CM

## 2017-07-24 DIAGNOSIS — M216X2 Other acquired deformities of left foot: Secondary | ICD-10-CM

## 2017-07-24 DIAGNOSIS — M216X1 Other acquired deformities of right foot: Secondary | ICD-10-CM | POA: Diagnosis not present

## 2017-07-24 DIAGNOSIS — M65979 Unspecified synovitis and tenosynovitis, unspecified ankle and foot: Secondary | ICD-10-CM

## 2017-07-24 DIAGNOSIS — M722 Plantar fascial fibromatosis: Secondary | ICD-10-CM | POA: Diagnosis not present

## 2017-07-24 DIAGNOSIS — M21969 Unspecified acquired deformity of unspecified lower leg: Secondary | ICD-10-CM | POA: Diagnosis not present

## 2017-07-24 NOTE — Progress Notes (Signed)
Subjective: 62 y.o. year old female patient presents for one month follow up on painful foot condition. Patient is wearing ankle support on both lower limbs. Stated that she has not made significant improvement with prolonged rest. She is able to do a minor chores for a short period but soon start having pain and swelling on her feet if she stands on her feet more than an hour or so. 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  Hx of Thyroid cancer 2014.   Objective: Dermatologic: Normal findings. Vascular: Pedal pulses are all palpable. No visible edema noted since wearing Ankle brace during weight bearing. Orthopedic: Tight Achilles tendon on right. Severe hypermobility in sagittal plane bilateral L>R, pain along the instep right foot, pain in plantar heel both feet.  Pain and swelling along the course of Posterior tibialis tendon on right with prolonged weight bearing. Neurologic: All epicritic and tactile sensations grossly intact.  Assessment: Posterior tibialis tendonitis right. Plantar fasciitis bilateral. Lesser metatarsalgia bilateral. Hypermobile first ray with STJ hyperpronation bilateral. Contracted Achilles tendon right. Unable to be on feet more than 1-2 hours per day.  Treatment: Reviewed clinical findings and available treatment options. Reviewed daily stretch exercise for tight Achilles tendon. Continue using Ankle brace. Stay off of feet for another 4 weeks. Increase weight bearing and ambulation as tolerated.  May benefit from water exercise and none weight bearing exercise like stationary bike to increase lower limb muscle strength. Return in one month for re evallation.

## 2017-07-25 ENCOUNTER — Encounter: Payer: Self-pay | Admitting: Podiatry

## 2017-07-25 NOTE — Patient Instructions (Signed)
One month follow up on bilateral foot pain.  Has made minimum improvement with prolonged rest and ankle support. Continue with Achilles tendon stretch exercise, water exercise, stationary bike exercise. Refrain from being on feet more than 2 hours per day till regain strength. Return in 1 month for re evaluation.

## 2017-07-26 DIAGNOSIS — H524 Presbyopia: Secondary | ICD-10-CM | POA: Diagnosis not present

## 2017-08-02 ENCOUNTER — Encounter: Payer: Self-pay | Admitting: Internal Medicine

## 2017-08-02 ENCOUNTER — Encounter: Payer: Self-pay | Admitting: Family Medicine

## 2017-08-02 NOTE — Telephone Encounter (Signed)
Results received- negative. Placed in MD red folder.

## 2017-08-14 ENCOUNTER — Ambulatory Visit: Payer: 59 | Admitting: Podiatry

## 2017-08-14 ENCOUNTER — Encounter: Payer: Self-pay | Admitting: Podiatry

## 2017-08-14 DIAGNOSIS — M216X2 Other acquired deformities of left foot: Secondary | ICD-10-CM

## 2017-08-14 DIAGNOSIS — M722 Plantar fascial fibromatosis: Secondary | ICD-10-CM | POA: Diagnosis not present

## 2017-08-14 DIAGNOSIS — M659 Synovitis and tenosynovitis, unspecified: Secondary | ICD-10-CM | POA: Diagnosis not present

## 2017-08-14 DIAGNOSIS — M21969 Unspecified acquired deformity of unspecified lower leg: Secondary | ICD-10-CM | POA: Diagnosis not present

## 2017-08-14 DIAGNOSIS — M216X1 Other acquired deformities of right foot: Secondary | ICD-10-CM

## 2017-08-14 NOTE — Progress Notes (Signed)
Subjective: 62 y.o. year old female patient presents for follow up on bilateral foot pain R>L.  She was using new pair of orthotics for about a week. She noted of pain at the base of 3rd and 4th digit of the left foot since she used orthotics.  Her last short term disability was filled out with a tentative return work date for  08/22/17. Stated that she is still not able to be on her feet more than an hour or so before her right foot start swell and become painful.   Positive history of Thyroid cancer 2014.  Objective: Dermatologic: No abnormal findings. Vascular: Pedal pulses are all palpable. Reduced ankle edema since wearing Ankle brace with limited ambulation. Orthopedic: Tight Achilles tendon right. Hypermobile first ray bilateral.  Pain and swelling at instep and ankle joint with increased weight bearing an ambulation. Neurologic: All epicritic and tactile sensations grossly intact.  Assessment: No changes in condition since last visit. Posterior tibialis tendonitis right, chronic. Plantar fasciitis bilateral. Lesser metatarsalgia bilateral. Hypermobile first ray with excess pronation STJ bilateral. Contracted Achilles tendon right. Unable to stand or walk more than 1-2 hours per day before developing pain and swelling right lower limb.  Treatment: Reviewed findings. Continue with stretch exercise for tight Achilles tendon. May not use Ankle brace and orthotics together due to tightness at forefoot area. Encouraged water exercise and lower limb muscle strengthening exercise. Continue with limited ambulation and weight bearing for another month. Return in one month.

## 2017-08-14 NOTE — Patient Instructions (Signed)
Follow up on orthotics and limited activity. Having issues with orthotics and minimum change in her painful foot condition. May require to extend her leave. Try orthotics without ankle brace for the next 4 weeks. Return in one month.

## 2017-08-14 NOTE — Progress Notes (Signed)
[facility] [address], [place], [place] [address] [phone]  Date:  08/15/2017   Name:  Karen Wall   DOB:  [birth year]   MRN:  [id number]  PCP:  Darreld Mclean, MD    Chief Complaint: Annual Exam (Pt here for CPE)   History of Present Illness:  Karen Wall is a 62 y.o. 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   Her last pap was in 2015- normal, negative HPV.  She is not SA  She is still taking vitamin D daily; 2,000 IUD daily.  She uses ativan to get to sleep, but will wake up about 5 hours later.  She is getting 5 hours a night at this point, maybe a little bit more if she does not have to work.  Prior to her thyroidectomy she might sleep 8-9 hours a night.   She takes ativan "on the nights that I have to sleep some to work the next day"   She has not used much ativan over the last 12 months but does need more now  thyriod and vitamin D checked recently per Dr. Cruzita Lederer  Flu: done cologuard this month- negative Mammo:12/18 Pap:2015 Tdap: 2010  She is out on STD for her feet issues right now- she is under the care of a podiatrist, Dr. Caffie Pinto She is wearing sweed-o ankle braces inside her shoes She has pain with walking and is not able to work right now Dr. Caffie Pinto has presented a treatment plan that would involve a few different operations to fix her issues and hopefully get her back to better function.  Karen Wall loves and trusts Dr. Caffie Pinto but is still not sure what she should do. She would like to perhaps get a second opinion prior to embarking on a surgical plan.  Offered to have her see Dr. Doran Durand and she would like to do this   She needs to get some labs for Dr. Cruzita Lederer on 2/14- I will order labs for  her today as future and she will do them all then   Last pap in 2015, never had an abnl, she is not SA Her son is a Ph-D Ship broker at [employer] in astrophysics. He is dating someone seriously and she is hoping they will get married  Her mood is good except she is frustrated that she is more sedentary recently due to her foot issues  Patient Active Problem List   Diagnosis Date Noted  . IDA (iron deficiency anemia) 07/23/2016  . Vitamin D deficiency 03/15/2016  . Low vitamin B12 level 03/15/2016  . Dyspnea 05/28/2014  . Chest tightness 05/28/2014  . 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 GERD (gastroesophageal reflux disease)   . H/O hiatal hernia    "very small "  . Hyperlipidemia   . Numbness    Left side of face around mouth - unknown etiology  . Palpitations    Occurred during hyperthyroid  . Thyroid cancer ([place])   . Thyroid disease    hyperthyroidism    Past Surgical History:  Procedure Laterality Date  . BREAST BIOPSY    . CHOLECYSTECTOMY  1992  . THYROIDECTOMY N/A 07/03/2013   Procedure: TOTAL THYROIDECTOMY;  Surgeon: Earnstine Regal, MD;  Location: [facility];  Service: General;  Laterality: N/A;    Social History   Tobacco Use  . Smoking status: Never Smoker  . Smokeless tobacco: Never Used  Substance Use Topics  . Alcohol use: No    Alcohol/week: 0.0 oz  . Drug use: No    Family History  Problem Relation Age of Onset  . Hypertension Mother   . Diabetes Mother   . Hyperlipidemia Mother   . Mental illness Father   . CAD Father        MI in his 83s  . Diabetes Father   . Hyperlipidemia Father   . Hypertension Father   . Mental illness Maternal Grandmother   . Stroke Maternal Grandfather     Allergies  Allergen Reactions  . Penicillins     unknown    Medication list has been reviewed and updated.  Current Outpatient Medications on File  Prior to Visit  Medication Sig Dispense Refill  . acetaminophen (TYLENOL) 500 MG tablet Take 500 mg by mouth every 6 (six) hours as needed for pain.    . Cholecalciferol (VITAMIN D3) 5000 units CAPS Take 1 capsule by mouth daily.    . Cyanocobalamin (VITAMIN B-12 PO) Take by mouth.    . IBUPROFEN PO Take 200 mg by mouth as needed.     Marland Kitchen levothyroxine (SYNTHROID, LEVOTHROID) 100 MCG tablet Take 1 tablet (100 mcg total) by mouth daily. 45 tablet 1  . liothyronine (CYTOMEL) 5 MCG tablet Take 1 tablet (5 mcg total) by mouth daily. 45 tablet 1  . LORazepam (ATIVAN) 1 MG tablet Take 1/2 or 1 tablet at bedtime as needed for insomnia 30 tablet 2   No current facility-administered medications on file prior to visit.     Review of Systems:  As per HPI- otherwise negative. No fever or chills No CP or SOB    Physical Examination: Vitals:   08/15/17 1404  BP: 132/84  Pulse: 92  Temp: 98 F (36.7 C)  SpO2: 97%   Vitals:   08/15/17 1404  Weight: 216 lb 12.8 oz (98.3 kg)  Height: 5\' 2"  (1.575 m)   Body mass index is 39.65 kg/m. Ideal Body Weight: Weight in (lb) to have BMI = 25: 136.4  GEN: WDWN, NAD, Non-toxic, A & O x 3, overweight, looks well HEENT: Atraumatic, Normocephalic. Neck supple. No masses, No LAD.  Bilateral TM wnl, oropharynx normal.  PEERL,EOMI.   Ears and Nose: No external deformity. CV: RRR, No M/G/R. No JVD. No thrill. No extra heart sounds. PULM: CTA B, no wheezes, crackles, rhonchi. No retractions. No resp. distress. No accessory muscle use. ABD: S, NT, ND. No rebound. No HSM. EXTR: No c/c/e NEURO she is wearing sweed-o braces on both ankles under lace up athletic type shoes  PSYCH: Normally interactive. Conversant. Not depressed or anxious appearing.  Calm demeanor.  Pelvic: normal, no vaginal lesions or discharge. Uterus normal, no CMT, no adnexal tendereness or masses    Assessment and Plan: Physical exam  Screening for hyperlipidemia - Plan: Lipid  panel  Screening for diabetes mellitus - Plan: Comprehensive metabolic panel, Hemoglobin A1c  Screening for deficiency anemia - Plan: CBC  Screening for cervical cancer - Plan: Cytology - PAP  Pronation deformity of both feet - Plan: Ambulatory referral to Orthopedic Surgery  CPE as above Immunizations are UTD Pap performed otherwise screening is UTD Ordered  labs which she will do in a couple of weeks Referral to see Dr. Doran Durand for his opinion about her foot and ankle issues   Signed Lamar Blinks, MD

## 2017-08-15 ENCOUNTER — Ambulatory Visit (INDEPENDENT_AMBULATORY_CARE_PROVIDER_SITE_OTHER): Payer: 59 | Admitting: Family Medicine

## 2017-08-15 ENCOUNTER — Encounter: Payer: Self-pay | Admitting: Family Medicine

## 2017-08-15 ENCOUNTER — Other Ambulatory Visit (HOSPITAL_COMMUNITY)
Admission: RE | Admit: 2017-08-15 | Discharge: 2017-08-15 | Disposition: A | Discharge status: Home / Self Care | Payer: 59 | Source: Ambulatory Visit | Attending: Family Medicine | Admitting: Family Medicine

## 2017-08-15 VITALS — BP 132/84 | HR 92 | Temp 98.0°F | Ht 62.0 in | Wt 216.8 lb

## 2017-08-15 DIAGNOSIS — Z124 Encounter for screening for malignant neoplasm of cervix: Secondary | ICD-10-CM | POA: Diagnosis not present

## 2017-08-15 DIAGNOSIS — Z0000 Encounter for general adult medical examination without abnormal findings: Secondary | ICD-10-CM

## 2017-08-15 DIAGNOSIS — Z13220 Encounter for screening for lipoid disorders: Secondary | ICD-10-CM

## 2017-08-15 DIAGNOSIS — Z131 Encounter for screening for diabetes mellitus: Secondary | ICD-10-CM | POA: Diagnosis not present

## 2017-08-15 DIAGNOSIS — M216X2 Other acquired deformities of left foot: Secondary | ICD-10-CM | POA: Diagnosis not present

## 2017-08-15 DIAGNOSIS — M216X1 Other acquired deformities of right foot: Secondary | ICD-10-CM | POA: Diagnosis not present

## 2017-08-15 DIAGNOSIS — Z130 Encounter for screening for diseases of the blood and blood-forming organs and certain disorders involving the immune mechanism: Secondary | ICD-10-CM

## 2017-08-15 NOTE — Patient Instructions (Addendum)
It was good to see you today!  Take care and I will be in touch with your pap asap I ordered labs for you to have done on 2/14- please do fast for the cholesterol panel We will have you see Dr. Doran Durand for an opinion about your feet prior to embarking on possible operations for your foot pain  Health Maintenance for Postmenopausal Women Menopause is a normal process in which your reproductive ability comes to an end. This process happens gradually over a span of months to years, usually between the ages of 6 and 31. Menopause is complete when you have missed 12 consecutive menstrual periods. It is important to talk with your health care provider about some of the most common conditions that affect postmenopausal women, such as heart disease, cancer, and bone loss (osteoporosis). Adopting a healthy lifestyle and getting preventive care can help to promote your health and wellness. Those actions can also lower your chances of developing some of these common conditions. What should I know about menopause? During menopause, you may experience a number of symptoms, such as:  Moderate-to-severe hot flashes.  Night sweats.  Decrease in sex drive.  Mood swings.  Headaches.  Tiredness.  Irritability.  Memory problems.  Insomnia.  Choosing to treat or not to treat menopausal changes is an individual decision that you make with your health care provider. What should I know about hormone replacement therapy and supplements? Hormone therapy products are effective for treating symptoms that are associated with menopause, such as hot flashes and night sweats. Hormone replacement carries certain risks, especially as you become older. If you are thinking about using estrogen or estrogen with progestin treatments, discuss the benefits and risks with your health care provider. What should I know about heart disease and stroke? Heart disease, heart attack, and stroke become more likely as you age. This  may be due, in part, to the hormonal changes that your body experiences during menopause. These can affect how your body processes dietary fats, triglycerides, and cholesterol. Heart attack and stroke are both medical emergencies. There are many things that you can do to help prevent heart disease and stroke:  Have your blood pressure checked at least every 1-2 years. High blood pressure causes heart disease and increases the risk of stroke.  If you are 21-32 years old, ask your health care provider if you should take aspirin to prevent a heart attack or a stroke.  Do not use any tobacco products, including cigarettes, chewing tobacco, or electronic cigarettes. If you need help quitting, ask your health care provider.  It is important to eat a healthy diet and maintain a healthy weight. ? Be sure to include plenty of vegetables, fruits, low-fat dairy products, and lean protein. ? Avoid eating foods that are high in solid fats, added sugars, or salt (sodium).  Get regular exercise. This is one of the most important things that you can do for your health. ? Try to exercise for at least 150 minutes each week. The type of exercise that you do should increase your heart rate and make you sweat. This is known as moderate-intensity exercise. ? Try to do strengthening exercises at least twice each week. Do these in addition to the moderate-intensity exercise.  Know your numbers.Ask your health care provider to check your cholesterol and your blood glucose. Continue to have your blood tested as directed by your health care provider.  What should I know about cancer screening? There are several types of cancer.  Take the following steps to reduce your risk and to catch any cancer development as early as possible. Breast Cancer  Practice breast self-awareness. ? This means understanding how your breasts normally appear and feel. ? It also means doing regular breast self-exams. Let your health care  provider know about any changes, no matter how small.  If you are 11 or older, have a clinician do a breast exam (clinical breast exam or CBE) every year. Depending on your age, family history, and medical history, it may be recommended that you also have a yearly breast X-ray (mammogram).  If you have a family history of breast cancer, talk with your health care provider about genetic screening.  If you are at high risk for breast cancer, talk with your health care provider about having an MRI and a mammogram every year.  Breast cancer (BRCA) gene test is recommended for women who have family members with BRCA-related cancers. Results of the assessment will determine the need for genetic counseling and BRCA1 and for BRCA2 testing. BRCA-related cancers include these types: ? Breast. This occurs in males or females. ? Ovarian. ? Tubal. This may also be called fallopian tube cancer. ? Cancer of the abdominal or pelvic lining (peritoneal cancer). ? Prostate. ? Pancreatic.  Cervical, Uterine, and Ovarian Cancer Your health care provider may recommend that you be screened regularly for cancer of the pelvic organs. These include your ovaries, uterus, and vagina. This screening involves a pelvic exam, which includes checking for microscopic changes to the surface of your cervix (Pap test).  For women ages 21-65, health care providers may recommend a pelvic exam and a Pap test every three years. For women ages 32-65, they may recommend the Pap test and pelvic exam, combined with testing for human papilloma virus (HPV), every five years. Some types of HPV increase your risk of cervical cancer. Testing for HPV may also be done on women of any age who have unclear Pap test results.  Other health care providers may not recommend any screening for nonpregnant women who are considered low risk for pelvic cancer and have no symptoms. Ask your health care provider if a screening pelvic exam is right for  you.  If you have had past treatment for cervical cancer or a condition that could lead to cancer, you need Pap tests and screening for cancer for at least 20 years after your treatment. If Pap tests have been discontinued for you, your risk factors (such as having a new sexual partner) need to be reassessed to determine if you should start having screenings again. Some women have medical problems that increase the chance of getting cervical cancer. In these cases, your health care provider may recommend that you have screening and Pap tests more often.  If you have a family history of uterine cancer or ovarian cancer, talk with your health care provider about genetic screening.  If you have vaginal bleeding after reaching menopause, tell your health care provider.  There are currently no reliable tests available to screen for ovarian cancer.  Lung Cancer Lung cancer screening is recommended for adults 4-45 years old who are at high risk for lung cancer because of a history of smoking. A yearly low-dose CT scan of the lungs is recommended if you:  Currently smoke.  Have a history of at least 30 pack-years of smoking and you currently smoke or have quit within the past 15 years. A pack-year is smoking an average of one pack of cigarettes  per day for one year.  Yearly screening should:  Continue until it has been 15 years since you quit.  Stop if you develop a health problem that would prevent you from having lung cancer treatment.  Colorectal Cancer  This type of cancer can be detected and can often be prevented.  Routine colorectal cancer screening usually begins at age 40 and continues through age 83.  If you have risk factors for colon cancer, your health care provider may recommend that you be screened at an earlier age.  If you have a family history of colorectal cancer, talk with your health care provider about genetic screening.  Your health care provider may also recommend  using home test kits to check for hidden blood in your stool.  A small camera at the end of a tube can be used to examine your colon directly (sigmoidoscopy or colonoscopy). This is done to check for the earliest forms of colorectal cancer.  Direct examination of the colon should be repeated every 5-10 years until age 68. However, if early forms of precancerous polyps or small growths are found or if you have a family history or genetic risk for colorectal cancer, you may need to be screened more often.  Skin Cancer  Check your skin from head to toe regularly.  Monitor any moles. Be sure to tell your health care provider: ? About any new moles or changes in moles, especially if there is a change in a mole's shape or color. ? If you have a mole that is larger than the size of a pencil eraser.  If any of your family members has a history of skin cancer, especially at a young age, talk with your health care provider about genetic screening.  Always use sunscreen. Apply sunscreen liberally and repeatedly throughout the day.  Whenever you are outside, protect yourself by wearing long sleeves, pants, a wide-brimmed hat, and sunglasses.  What should I know about osteoporosis? Osteoporosis is a condition in which bone destruction happens more quickly than new bone creation. After menopause, you may be at an increased risk for osteoporosis. To help prevent osteoporosis or the bone fractures that can happen because of osteoporosis, the following is recommended:  If you are 2-28 years old, get at least 1,000 mg of calcium and at least 600 mg of vitamin D per day.  If you are older than age 46 but younger than age 23, get at least 1,200 mg of calcium and at least 600 mg of vitamin D per day.  If you are older than age 81, get at least 1,200 mg of calcium and at least 800 mg of vitamin D per day.  Smoking and excessive alcohol intake increase the risk of osteoporosis. Eat foods that are rich in  calcium and vitamin D, and do weight-bearing exercises several times each week as directed by your health care provider. What should I know about how menopause affects my mental health? Depression may occur at any age, but it is more common as you become older. Common symptoms of depression include:  Low or sad mood.  Changes in sleep patterns.  Changes in appetite or eating patterns.  Feeling an overall lack of motivation or enjoyment of activities that you previously enjoyed.  Frequent crying spells.  Talk with your health care provider if you think that you are experiencing depression. What should I know about immunizations? It is important that you get and maintain your immunizations. These include:  Tetanus, diphtheria, and  pertussis (Tdap) booster vaccine.  Influenza every year before the flu season begins.  Pneumonia vaccine.  Shingles vaccine.  Your health care provider may also recommend other immunizations. This information is not intended to replace advice given to you by your health care provider. Make sure you discuss any questions you have with your health care provider. Document Released: 08/24/2005 Document Revised: 01/20/2016 Document Reviewed: 04/05/2015 Elsevier Interactive Patient Education  2018 [employer].

## 2017-08-20 ENCOUNTER — Encounter: Payer: Self-pay | Admitting: Family Medicine

## 2017-08-20 LAB — CYTOLOGY - PAP
DIAGNOSIS: NEGATIVE
HPV (WINDOPATH): NOT DETECTED

## 2017-08-30 ENCOUNTER — Other Ambulatory Visit (INDEPENDENT_AMBULATORY_CARE_PROVIDER_SITE_OTHER): Payer: 59

## 2017-08-30 DIAGNOSIS — Z130 Encounter for screening for diseases of the blood and blood-forming organs and certain disorders involving the immune mechanism: Secondary | ICD-10-CM | POA: Diagnosis not present

## 2017-08-30 DIAGNOSIS — Z131 Encounter for screening for diabetes mellitus: Secondary | ICD-10-CM

## 2017-08-30 DIAGNOSIS — Z13220 Encounter for screening for lipoid disorders: Secondary | ICD-10-CM

## 2017-08-30 DIAGNOSIS — E890 Postprocedural hypothyroidism: Secondary | ICD-10-CM

## 2017-08-30 DIAGNOSIS — D473 Essential (hemorrhagic) thrombocythemia: Secondary | ICD-10-CM

## 2017-08-30 DIAGNOSIS — D75839 Thrombocytosis, unspecified: Secondary | ICD-10-CM

## 2017-08-30 LAB — LIPID PANEL
Cholesterol: 206 mg/dL — ABNORMAL HIGH (ref 0–200)
HDL: 52.3 mg/dL (ref >39.00)
LDL Cholesterol: 132 mg/dL — ABNORMAL HIGH (ref 0–99)
NonHDL: 153.93
TRIGLYCERIDES: 110 mg/dL (ref 0.0–149.0)
Total CHOL/HDL Ratio: 4
VLDL: 22 mg/dL (ref 0.0–40.0)

## 2017-08-30 LAB — COMPREHENSIVE METABOLIC PANEL
ALBUMIN: 3.7 g/dL (ref 3.5–5.2)
ALK PHOS: 56 U/L (ref 39–117)
ALT: 12 U/L (ref 0–35)
AST: 14 U/L (ref 0–37)
BUN: 16 mg/dL (ref 6–23)
CALCIUM: 8.9 mg/dL (ref 8.4–10.5)
CO2: 30 mEq/L (ref 19–32)
Chloride: 101 mEq/L (ref 96–112)
Creatinine, Ser: 0.56 mg/dL (ref 0.40–1.20)
GFR: 116.82 mL/min (ref >60.00)
Glucose, Bld: 99 mg/dL (ref 70–99)
Potassium: 4 mEq/L (ref 3.5–5.1)
Sodium: 140 mEq/L (ref 135–145)
TOTAL PROTEIN: 7.1 g/dL (ref 6.0–8.3)
Total Bilirubin: 0.4 mg/dL (ref 0.2–1.2)

## 2017-08-30 LAB — HEMOGLOBIN A1C: HEMOGLOBIN A1C: 5.8 % (ref 4.6–6.5)

## 2017-08-30 LAB — CBC
HCT: 39 % (ref 36.0–46.0)
Hemoglobin: 12.9 g/dL (ref 12.0–15.0)
MCHC: 33.2 g/dL (ref 30.0–36.0)
MCV: 80.3 fl (ref 78.0–100.0)
PLATELETS: 536 10*3/uL — AB (ref 150.0–400.0)
RBC: 4.85 Mil/uL (ref 3.87–5.11)
RDW: 15.6 % — ABNORMAL HIGH (ref 11.5–15.5)
WBC: 8.5 10*3/uL (ref 4.0–10.5)

## 2017-09-01 ENCOUNTER — Encounter: Payer: Self-pay | Admitting: Family Medicine

## 2017-09-01 NOTE — Progress Notes (Signed)
Received her labs Results for orders placed or performed in visit on 08/30/17  Hemoglobin A1c  Result Value Ref Range   Hgb A1c MFr Bld 5.8 4.6 - 6.5 %  Lipid panel  Result Value Ref Range   Cholesterol 206 (H) 0 - 200 mg/dL   Triglycerides 110.0 0.0 - 149.0 mg/dL   HDL 52.30 >39.00 mg/dL   VLDL 22.0 0.0 - 40.0 mg/dL   LDL Cholesterol 132 (H) 0 - 99 mg/dL   Total CHOL/HDL Ratio 4    NonHDL 153.93   Comprehensive metabolic panel  Result Value Ref Range   Sodium 140 135 - 145 mEq/L   Potassium 4.0 3.5 - 5.1 mEq/L   Chloride 101 96 - 112 mEq/L   CO2 30 19 - 32 mEq/L   Glucose, Bld 99 70 - 99 mg/dL   BUN 16 6 - 23 mg/dL   Creatinine, Ser 0.56 0.40 - 1.20 mg/dL   Total Bilirubin 0.4 0.2 - 1.2 mg/dL   Alkaline Phosphatase 56 39 - 117 U/L   AST 14 0 - 37 U/L   ALT 12 0 - 35 U/L   Total Protein 7.1 6.0 - 8.3 g/dL   Albumin 3.7 3.5 - 5.2 g/dL   Calcium 8.9 8.4 - 10.5 mg/dL   GFR 116.82 >60.00 mL/min  CBC  Result Value Ref Range   WBC 8.5 4.0 - 10.5 K/uL   RBC 4.85 3.87 - 5.11 Mil/uL   Platelets 536.0 (H) 150.0 - 400.0 K/uL   Hemoglobin 12.9 12.0 - 15.0 g/dL   HCT 39.0 36.0 - 46.0 %   MCV 80.3 78.0 - 100.0 fl   MCHC 33.2 30.0 - 36.0 g/dL   RDW 15.6 (H) 11.5 - 15.5 %   Message to pt

## 2017-09-01 NOTE — Addendum Note (Signed)
Addended by: Lamar Blinks C on: 09/01/2017 06:14 PM   Modules accepted: Orders

## 2017-09-03 ENCOUNTER — Other Ambulatory Visit: Payer: 59

## 2017-09-03 LAB — T4, FREE: FREE T4: 0.97 ng/dL (ref 0.60–1.60)

## 2017-09-03 LAB — TSH: TSH: 0.45 u[IU]/mL (ref 0.35–4.50)

## 2017-09-03 LAB — T3, FREE: T3 FREE: 3.5 pg/mL (ref 2.3–4.2)

## 2017-09-03 MED FILL — LIOTHYRONINE SODIUM 5 MCG T: 5 | 45 days supply | Qty: 45 | Fill #1

## 2017-09-03 MED FILL — LEVOTHYROXINE 100 MCG TAB: 100 | 45 days supply | Qty: 45 | Fill #1

## 2017-09-11 ENCOUNTER — Ambulatory Visit: Payer: 59 | Admitting: Podiatry

## 2017-09-11 ENCOUNTER — Encounter: Payer: Self-pay | Admitting: Podiatry

## 2017-09-11 DIAGNOSIS — M216X2 Other acquired deformities of left foot: Secondary | ICD-10-CM | POA: Diagnosis not present

## 2017-09-11 DIAGNOSIS — M21969 Unspecified acquired deformity of unspecified lower leg: Secondary | ICD-10-CM

## 2017-09-11 DIAGNOSIS — M216X1 Other acquired deformities of right foot: Secondary | ICD-10-CM | POA: Diagnosis not present

## 2017-09-11 DIAGNOSIS — M659 Synovitis and tenosynovitis, unspecified: Secondary | ICD-10-CM | POA: Diagnosis not present

## 2017-09-11 DIAGNOSIS — M722 Plantar fascial fibromatosis: Secondary | ICD-10-CM | POA: Diagnosis not present

## 2017-09-11 NOTE — Progress Notes (Signed)
Subjective: 62 y.o. year old female patient presents for follow up on bilateral foot pain. Stated that she was making some improvement since she limits her weight bearing activity. She is able to walk at least 30 minutes before she start feeling pain in her feet. Been out of work since 06/27/17 due to excessive pain and swelling on both feet. R>L.  Objective: Dermatologic: No abnormal findings. Vascular: Pedal pulses are all palpable. Orthopedic: Reduced enderness along the Tibialis posterior tendon insertion site right. Found normal ankle joint flexion and extension bilateral. Neurologic: All epicritic and tactile sensations grossly intact.  Assessment: Improved Achilles tendon contracture. Reduced Tendinoplasty Posterior tibial right. Hypermobile first ray remain the same. Unable to stand or walk more than 1-2 hours per day before experiencing significant pain and swelling.  Treatment: Reviewed findings and available treatment options. Continue to increase weight bearing activity as tolerated. Leave extended another month. Reviewed possible augmentation procedure for the weak plantar ligament.  Return in one week.

## 2017-09-11 NOTE — Patient Instructions (Signed)
Follow up on bilateral foot pain. Having some improvement. Able to withstand 30 minutes without too much discomfort. Will continue with current level of activity. Continue daily activity as tolerated. Discussed possible ligament repair/strengthening procedure. Return in one month.

## 2017-10-09 ENCOUNTER — Ambulatory Visit: Payer: 59 | Admitting: Podiatry

## 2017-10-09 ENCOUNTER — Encounter: Payer: Self-pay | Admitting: Podiatry

## 2017-10-09 DIAGNOSIS — M659 Synovitis and tenosynovitis, unspecified: Secondary | ICD-10-CM | POA: Diagnosis not present

## 2017-10-09 DIAGNOSIS — M21969 Unspecified acquired deformity of unspecified lower leg: Secondary | ICD-10-CM

## 2017-10-09 DIAGNOSIS — M216X2 Other acquired deformities of left foot: Secondary | ICD-10-CM | POA: Diagnosis not present

## 2017-10-09 DIAGNOSIS — M216X1 Other acquired deformities of right foot: Secondary | ICD-10-CM | POA: Diagnosis not present

## 2017-10-09 DIAGNOSIS — M722 Plantar fascial fibromatosis: Secondary | ICD-10-CM

## 2017-10-09 NOTE — Progress Notes (Signed)
Subjective: 62 y.o. year old female patient presents for one month follow up on bilateral foot pain. Stated that she experienced excruciating pain on the instep area after not even been on feet for an hour.  Patient wishes to discuss possible surgical options.  Objective: Dermatologic: No abnormal findings. Vascular: Pedal pulses are all palpable. Orthopedic: Pain at Posterior Tibialis tendon insertion site after prolonged weight bearing. Excess sagittal plane motion of the first ray with loading of forefoot bilateral. Increased STJ hyperpronation with weight bearing.  Neurologic: All epicritic and tactile sensations grossly intact.  Assessment: Recurring pain at the Posterior tibialis tendon with increased weight bearing. Hypermobile first ray with STJ hyperpronation bilateral.  Treatment: Reviewed clinical findings and available surgical options. As per recommendation patient wishes to try STJ arthroereisis procedure to limit excess STJ hyperpronation and subsequently reduce recurring pain at Posterior tibialis tendon upon weight bearing.  Explained that this procedure might take 1 month post op convalescent time.  Surgery consent form reviewed.

## 2017-10-09 NOTE — Patient Instructions (Signed)
One month follow up on bilateral foot pain. Still not able to stand on feet more than one hour without pain. As per discussion pre op consent form reviewed for STJ arthroereisis right foot to limit excess STJ hyperpronation and subsequently reduce recurring pain at Posterior tibialis tendon upon weight bearing.  Explained that this procedure might take 1 month post op convalescent time.

## 2017-10-10 ENCOUNTER — Telehealth: Payer: Self-pay

## 2017-10-10 NOTE — Telephone Encounter (Signed)
Pt called stating that she does not want to have surgery on 10/18/17 as planned. She would like to wait until after easter if she does decide to have the surgery. Will get benefits on surgery and discuss with pt.

## 2017-10-11 ENCOUNTER — Ambulatory Visit: Payer: 59 | Admitting: Family Medicine

## 2017-10-14 ENCOUNTER — Ambulatory Visit: Payer: 59 | Admitting: Family Medicine

## 2017-10-14 ENCOUNTER — Other Ambulatory Visit: Payer: Self-pay | Admitting: Internal Medicine

## 2017-10-14 ENCOUNTER — Encounter: Payer: Self-pay | Admitting: *Deleted

## 2017-10-14 ENCOUNTER — Encounter: Payer: Self-pay | Admitting: Family Medicine

## 2017-10-14 VITALS — BP 144/94 | HR 85 | Ht 62.0 in | Wt 200.0 lb

## 2017-10-14 DIAGNOSIS — M79671 Pain in right foot: Secondary | ICD-10-CM

## 2017-10-14 DIAGNOSIS — M79672 Pain in left foot: Secondary | ICD-10-CM

## 2017-10-14 DIAGNOSIS — M25571 Pain in right ankle and joints of right foot: Secondary | ICD-10-CM | POA: Diagnosis not present

## 2017-10-14 DIAGNOSIS — M25572 Pain in left ankle and joints of left foot: Secondary | ICD-10-CM

## 2017-10-14 DIAGNOSIS — G8929 Other chronic pain: Secondary | ICD-10-CM | POA: Diagnosis not present

## 2017-10-14 MED ORDER — NITROGLYCERIN 0.2 MG/HR TD PT24: MEDICATED_PATCH | TRANSDERMAL | 1 refills | Status: DC

## 2017-10-14 MED FILL — LIOTHYRONINE SODIUM 5 MCG T: 5 | 45 days supply | Qty: 45 | Fill #0

## 2017-10-14 MED FILL — LEVOTHYROXINE 100 MCG TAB: 100 | 45 days supply | Qty: 45 | Fill #0

## 2017-10-14 MED FILL — NITROGLYCERIN 0.2 MG/HR PAT: 0.2 | 84 days supply | Qty: 21 | Fill #0

## 2017-10-14 NOTE — Patient Instructions (Signed)
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 Start physical therapy, do home exercises on days you don't go to therapy. Wear inserts in supportive shoes at all times. Avoid flat shoes, barefoot walking. Icing if needed 15 minutes at a time 3-4 times a day. Ibuprofen or aleve only if needed. Nitro patches 1/4th patch to affected area, change daily - after a week you can use on the other ankle as well. I always recommend a second opinion for this condition (if you don't improve with conservative treatment) if you consider surgical intervention. Follow up with me in 6 weeks for reevaluation.

## 2017-10-15 ENCOUNTER — Telehealth: Payer: Self-pay | Admitting: *Deleted

## 2017-10-15 ENCOUNTER — Encounter: Payer: Self-pay | Admitting: Family Medicine

## 2017-10-15 DIAGNOSIS — M25572 Pain in left ankle and joints of left foot: Secondary | ICD-10-CM

## 2017-10-15 DIAGNOSIS — M25571 Pain in right ankle and joints of right foot: Secondary | ICD-10-CM

## 2017-10-15 HISTORY — DX: Pain in right ankle and joints of right foot: M25.571

## 2017-10-15 NOTE — Progress Notes (Signed)
PCP: Copland, Gay Filler, MD  Subjective:   HPI: Patient is a 62 y.o. female here for bilateral foot pain.  Patient reports that she has had several year history of bilateral foot and ankle pain. She states that this has been much worse over the past 2 years. Pain is currently 2 out of 10 both feet and ankles medially. 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 She has tried several different inserts, stretching. She tried an air pump arch support from orthopedics. She is also seen podiatry and was recommended to have surgical intervention which was discussed at her last visit after she tried insoles. She does get some numbness into the left medial plantar foot as well. She was given a right posterior tibialis tendon sheath injection in December by podiatry which she states helped for about 1 hour. She is tried Tylenol and Advil as well without much benefit. She has not tried physical therapy. No skin changes.   Past Medical History:  Diagnosis Date  . GERD (gastroesophageal reflux disease)   . H/O hiatal hernia    "very small "  . Hyperlipidemia   . Numbness    Left side of face around mouth - unknown etiology  . Palpitations    Occurred during hyperthyroid  . Thyroid cancer ([place])   . Thyroid disease    hyperthyroidism    Current Outpatient Medications on File Prior to Visit  Medication Sig Dispense Refill  . acetaminophen (TYLENOL) 500 MG tablet Take 500 mg by mouth every 6 (six) hours as needed for pain.    . Cholecalciferol (VITAMIN D3) 5000 units CAPS Take 1 capsule by mouth daily.    . Cyanocobalamin (VITAMIN B-12 PO) Take by mouth.    . IBUPROFEN PO Take 200 mg by mouth as needed.     Marland Kitchen LORazepam (ATIVAN) 1 MG tablet Take 1/2 or 1 tablet at bedtime as needed for insomnia 30 tablet 2   No current facility-administered medications on file prior to visit.     Past Surgical History:  Procedure Laterality Date  .  BREAST BIOPSY    . CHOLECYSTECTOMY  1992  . THYROIDECTOMY N/A 07/03/2013   Procedure: TOTAL THYROIDECTOMY;  Surgeon: Earnstine Regal, MD;  Location: [facility];  Service: General;  Laterality: N/A;    Allergies  Allergen Reactions  . Penicillins     unknown    Social History   Socioeconomic History  . Marital status: Divorced    Spouse name: Not on file  . Number of children: 1  . Years of education: Not on file  . Highest education level: Not on file  Occupational History  . Occupation: Therapist, sports  Social Needs  . Financial resource strain: Not on file  . Food insecurity:    Worry: Not on file    Inability: Not on file  . Transportation needs:    Medical: Not on file    Non-medical: Not on file  Tobacco Use  . Smoking status: Never Smoker  . Smokeless tobacco: Never Used  Substance and Sexual Activity  . Alcohol use: No    Alcohol/week: 0.0 oz  . Drug use: No  . Sexual activity: Never  Lifestyle  . Physical activity:    Days per week: Not on file    Minutes per session: Not on file  . Stress: Not on file  Relationships  . Social connections:    Talks  on phone: Not on file    Gets together: Not on file    Attends religious service: Not on file    Active member of club or organization: Not on file    Attends meetings of clubs or organizations: Not on file    Relationship status: Not on file  . Intimate partner violence:    Fear of current or ex partner: Not on file    Emotionally abused: Not on file    Physically abused: Not on file    Forced sexual activity: Not on file  Other Topics Concern  . Not on file  Social History Narrative   Divorced. Education: [employer]. Exercise: Yes.     Family History  Problem Relation Age of Onset  . Hypertension Mother   . Diabetes Mother   . Hyperlipidemia Mother   . Mental illness Father   . CAD Father        MI in his 1s  . Diabetes Father   . Hyperlipidemia Father   . Hypertension Father   . Mental illness Maternal  Grandmother   . Stroke Maternal Grandfather     BP (!) 144/94   Pulse 85   Ht 5\' 2"  (1.575 m)   Wt 200 lb (90.7 kg)   BMI 36.58 kg/m    Review of Systems: See HPI above.     Objective:  Physical Exam:  Gen: NAD, comfortable in exam room  Right foot/ankle: Pes planus.  Mod swelling medially, focal.  No other deformity, swelling, ecchymoses FROM with pain on internal rotation.  5/5 strength except 4/5 with internal rotation. TTP course of posterior tibialis tendon at level of ankle joint.  No other tenderness. Negative ant drawer and talar tilt.   Negative syndesmotic compression. Negative calcaneal squeeze. Thompsons test negative. NV intact distally.  Left foot/ankle: Pes planus.  Mild swelling of ankle.  No other deformity, ecchymoses FROM with pain on internal rotation.  5/5 strength except 4/5 with internal rotation. TTP course of posterior tibialis tendon at level of ankle joint.  No other tenderness. Negative ant drawer and talar tilt.   Negative syndesmotic compression. Negative calcaneal squeeze. Thompsons test negative. NV intact distally.   MSK u/s bilateral ankles:  Significant thickening of posterior tibialis tendons with 5% partial tear on right at level of ankle joint, 10% partial tear on left.  Some neovascularity in both tendons.  No tenosynovitis.  Assessment & Plan:  1. Bilateral foot/ankle pain - 2/2 chronic posterior tibialis tendinopathy.  She has not done physical therapy for this before so we will start this and do home exercises on days she does not go to therapy.  Icing, ibuprofen or Aleve if needed.  Avoid flat shoes and barefoot walking.  Advised to wear orthotics and supportive shoes at all times.  She will try Nitropatch as well for the past affected area, change daily.  She will use these on one side and after a week will use on the other side as well.  I advised if she does not improve with conservative measures that as always I would recommend  a second opinion if she is considering surgical intervention.  She will follow with [place] in 6 weeks as needed.

## 2017-10-15 NOTE — Telephone Encounter (Signed)
Patient called this am concerned stating she thinks she made a"procedural error" in getting a second opinion from [place]. She still wants Dr. Caffie Pinto to be her doctor for her feet but states she wanted to "exhaust all options" before resorting to surgery. She asks if Dr. Caffie Pinto could order physical therapy for her. I informed patient that at this point she has sought a second opinion from Dr. Barbaraann Barthel which is completley fine and her choice and if he recommended physical therapy then it would be best to continue the PT with him since her ordered PT. I explained to her that she has been seeing Dr. Caffie Pinto for a number of months and Dr. Latanya Presser  recommendation is surgery. The patient then stated that she would like to discuss this with Dr. Caffie Pinto. I did schedule an appointment for her  to discuss her concerns.  Later in the day the patient's sister called and asked if we could just ask Dr. Caffie Pinto if she could order physical therapy and states that the patient wondered why physical therapy was not ordered. I did explain to the patients sister that if her recommendation is surgery then this is most likely what Dr. Caffie Pinto feels is the best option given the patient's specific case. At this point I just reiterated what was stated earlier and declined to speak with the sister about this further as she is not on patients DPR. An appointment was made for the patient for tomorrow to discuss the option of physical therapy.  This phone note has been routed for provider review

## 2017-10-15 NOTE — Assessment & Plan Note (Signed)
2/2 chronic posterior tibialis tendinopathy.  She has not done physical therapy for this before so we will start this and do home exercises on days she does not go to therapy.  Icing, ibuprofen or Aleve if needed.  Avoid flat shoes and barefoot walking.  Advised to wear orthotics and supportive shoes at all times.  She will try Nitropatch as well for the past affected area, change daily.  She will use these on one side and after a week will use on the other side as well.  I advised if she does not improve with conservative measures that as always I would recommend a second opinion if she is considering surgical intervention.  She will follow with [place] in 6 weeks as needed.

## 2017-10-16 ENCOUNTER — Encounter: Payer: Self-pay | Admitting: Podiatry

## 2017-10-16 ENCOUNTER — Ambulatory Visit: Payer: 59 | Admitting: Podiatry

## 2017-10-16 DIAGNOSIS — M659 Synovitis and tenosynovitis, unspecified: Secondary | ICD-10-CM

## 2017-10-16 NOTE — Patient Instructions (Signed)
Physical therapy referral placed.

## 2017-10-16 NOTE — Progress Notes (Signed)
Patient had done a second opinion and was advised to have physical therapy. Reviewed benefit of physical therapy and referral placed.

## 2017-10-21 ENCOUNTER — Other Ambulatory Visit: Payer: Self-pay

## 2017-10-21 ENCOUNTER — Ambulatory Visit: Payer: 59 | Attending: Family Medicine | Admitting: Physical Therapy

## 2017-10-21 ENCOUNTER — Ambulatory Visit: Payer: 59 | Admitting: Podiatry

## 2017-10-21 ENCOUNTER — Encounter: Payer: Self-pay | Admitting: Physical Therapy

## 2017-10-21 DIAGNOSIS — R262 Difficulty in walking, not elsewhere classified: Secondary | ICD-10-CM | POA: Diagnosis not present

## 2017-10-21 DIAGNOSIS — M25571 Pain in right ankle and joints of right foot: Secondary | ICD-10-CM | POA: Insufficient documentation

## 2017-10-21 DIAGNOSIS — R2689 Other abnormalities of gait and mobility: Secondary | ICD-10-CM | POA: Insufficient documentation

## 2017-10-21 DIAGNOSIS — M25572 Pain in left ankle and joints of left foot: Secondary | ICD-10-CM | POA: Diagnosis not present

## 2017-10-21 NOTE — Patient Instructions (Signed)
Gastroc / Heel Cord Stretch - Seated With Towel   Sit on floor, towel around ball of foot. Gently pull foot in toward body, stretching heel cord and calf. Hold for _3__ seconds. Repeat on involved leg. Repeat _10-15__ times. Do __1-2_ times per day.  Dorsiflexion: Resisted   Facing anchor, tubing around left foot, pull toward face.  Repeat __10-15__ times per set. Do __1-2__ sets per session.   Eversion: Resisted   With right foot in tubing loop, hold tubing around other foot to resist and turn foot out. Repeat __10-15__ times per set. Do _2___ sets per session.   Inversion: Resisted   Cross legs with right leg underneath, foot in tubing loop. Hold tubing around other foot to resist and turn foot in. Repeat __10-15__ times per set. Do __2__ sets per session.   Plantar Flexion: Resisted   Anchor behind, tubing around left foot, press down. Repeat __10-15__ times per set. Do __2__ sets per session.   Ankle Alphabet   Using left ankle and foot only, trace the letters of the alphabet. Perform A to Z. Repeat _1-2___ times per set.

## 2017-10-21 NOTE — Therapy (Signed)
[facility] [address]  [place], [place], [address] Phone: [phone]   Fax:  [phone]  Physical Therapy Evaluation  Patient Details  Name: Karen Wall MRN: [id number] Date of Birth: 10-06-1955 Referring Provider: Dr. Karlton Lemon   Encounter Date: 10/21/2017  PT End of Session - 10/21/17 1152    Visit Number  1    Number of Visits  12    Date for PT Re-Evaluation  12/02/17    PT Start Time  1018    PT Stop Time  1100    PT Time Calculation (min)  42 min    Activity Tolerance  Patient tolerated treatment well    Behavior During Therapy  [facility] for tasks assessed/performed       Past Medical History:  Diagnosis Date  . GERD (gastroesophageal reflux disease)   . H/O hiatal hernia    "very small "  . Hyperlipidemia   . Numbness    Left side of face around mouth - unknown etiology  . Palpitations    Occurred during hyperthyroid  . Thyroid cancer ([place])   . Thyroid disease    hyperthyroidism    Past Surgical History:  Procedure Laterality Date  . BREAST BIOPSY    . CHOLECYSTECTOMY  1992  . THYROIDECTOMY N/A 07/03/2013   Procedure: TOTAL THYROIDECTOMY;  Surgeon: Earnstine Regal, MD;  Location: [facility];  Service: General;  Laterality: N/A;    There were no vitals filed for this visit.   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 Last year - had issues with L foot, "arch dropped" and turned  bluish - wore an air cast and felt better and then R foot began to have issues. Has tried changing shoes and orthotics. Saw a podiatrist - injections, B ankle lace up brace and hard orthotics. Orthotics help some but does still have pain with walking. Seen by sports med MD - advised to try PT. Not currently work due to pain.     How long can you stand comfortably?  10 minutes    How long can you walk comfortably?  few minutes    Diagnostic tests  MSK u/s: Significant thickening of posterior  tibialis tendons with 5% partial tear on right at level of ankle joint, 10% partial tear on left.  Some neovascularity in both tendons.  No tenosynovitis.    Patient Stated Goals  improve pain, return to work    Currently in Pain?  Yes    Pain Score  1     Pain Location  Ankle and foot    Pain Orientation  Right;Left    Pain Descriptors / Indicators  Aching;Sore    Pain Type  Chronic pain    Pain Onset  More than a month ago    Pain Frequency  Intermittent    Aggravating Factors   standing, walking, weight bearing    Pain Relieving Factors  ice, nitro patches?         [facility] PT Assessment - 10/21/17 1024      Assessment   Medical Diagnosis  Bilateral foot pain    Referring Provider  Dr. Karlton Lemon    Onset Date/Surgical Date  -- ~1 year    Next MD Visit  10/26/17    Prior Therapy  no      Precautions   Precautions  None      Restrictions   Weight Bearing  Restrictions  No      Balance Screen   Has the patient fallen in the past 6 months  Yes    How many times?  2    Has the patient had a decrease in activity level because of a fear of falling?   No    Is the patient reluctant to leave their home because of a fear of falling?   No      Home Environment   Living Environment  Private residence    Type of [place] to enter    Entrance Stairs-Number of Steps  9    Entrance Stairs-Rails  Can reach both    [place]  Two level;Able to live on main level with bedroom/bathroom    Alternate Level Stairs-Number of Steps  14    Alternate Level Stairs-Rails  Right      Prior Function   Level of Independence  Independent    Vocation  On disability    Vocation Requirements  nursing      Cognition   Overall Cognitive Status  Within Functional Limits for tasks assessed      Observation/Other Assessments   Focus on Therapeutic Outcomes (FOTO)   Foot: 40 (60% limited, predicted 45% limited)      Sensation   Light Touch  Impaired by gross assessment  N&T into L foot      ROM / Strength   AROM / PROM / Strength  AROM;Strength      AROM   AROM Assessment Site  Ankle    Right/Left Ankle  Right;Left    Right Ankle Dorsiflexion  4    Right Ankle Plantar Flexion  40    Right Ankle Inversion  26    Right Ankle Eversion  4    Left Ankle Dorsiflexion  6    Left Ankle Plantar Flexion  38    Left Ankle Inversion  32    Left Ankle Eversion  8      Strength   Overall Strength Comments  gross strength at B ankle 4-/5 to 4/5 with pain provocation at all motions    Strength Assessment Site  Ankle    Right/Left Ankle  Right;Left      Palpation   Palpation comment  TTP at B posterior tib, B plantar arch                Objective measurements completed on examination: See above findings.      [facility] Adult PT Treatment/Exercise - 10/21/17 0001      Exercises   Exercises  Ankle      Manual Therapy   Manual Therapy  Taping    Kinesiotex  Inhibit Muscle      Kinesiotix   Inhibit Muscle   B ankle - posterior tib pattern       Ankle Exercises: Stretches   Gastroc Stretch  3 reps;30 seconds    Gastroc Stretch Limitations  bilateral - seated with towel      Ankle Exercises: Seated   ABC's  1 rep    ABC's Limitations  bilateral    Other Seated Ankle Exercises  bilateral 4-way resisted ankle - yellow tband x 10 reps              PT Education - 10/21/17 1152    Education provided  Yes    Education Details  exam findings, POC, HEP    Person(s) Educated  Patient  Methods  Explanation;Demonstration;Handout    Comprehension  Verbalized understanding;Returned demonstration          PT Long Term Goals - 10/21/17 1204      PT LONG TERM GOAL #1   Title  patient to be independent with advanced HEP    Status  New    Target Date  12/02/17      PT LONG TERM GOAL #2   Title  patient to demonstrate AROM at B ankle to WNL and pain no greater than 1/10    Status  New    Target Date  12/02/17      PT LONG TERM GOAL  #3   Title  patient to improve B foot/ankle strength to >/= 4+/5 with pain no greater than 1/10    Status  New    Target Date  12/02/17      PT LONG TERM GOAL #4   Title  patient to report ability to stand/walk for >/= 1 hour with pain no greater than 2/10    Status  New    Target Date  12/02/17             Plan - 10/21/17 1152    Clinical Impression Statement  Ms. Kyte is a 62 y/o female presenting to OPPT today regarding B foot/ankle pain that has been present for ~1 year. Patient with limited gait mechanics with heavy pronation at B foot likely contributing to overall pain patterns and pain with weight bearing. Patient today also with deficits in AROM and strength at B foot/ankle. patient given initial HEP today for gentle stretchingand strengthening with good tolerance and carryover. Patient to benefit from skilled PT intervention to address pain and functional mobility limitations.     Clinical Presentation  Stable    Clinical Decision Making  Low    Rehab Potential  Good    PT Frequency  2x / week    PT Duration  6 weeks    PT Treatment/Interventions  ADLs/Self Care Home Management;Cryotherapy;Electrical Stimulation;Iontophoresis 4mg /ml Dexamethasone;Moist Heat;Therapeutic exercise;Therapeutic activities;Functional mobility training;Gait training;Stair training;Ultrasound;Balance training;Neuromuscular re-education;Patient/family education;Manual techniques;Vasopneumatic Device;Taping;Dry needling;Passive range of motion    Consulted and Agree with Plan of Care  Patient       Patient will benefit from skilled therapeutic intervention in order to improve the following deficits and impairments:  Abnormal gait, Decreased activity tolerance, Decreased range of motion, Decreased mobility, Difficulty walking, Pain, Decreased strength  Visit Diagnosis: Pain in left ankle and joints of left foot  Pain in right ankle and joints of right foot  Difficulty walking  Other  abnormalities of gait and mobility     Problem List Patient Active Problem List   Diagnosis Date Noted  . Bilateral ankle pain 10/15/2017  . IDA (iron deficiency anemia) 07/23/2016  . Vitamin D deficiency 03/15/2016  . Low vitamin B12 level 03/15/2016  . Dyspnea 05/28/2014  . Chest tightness 05/28/2014  . 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 Postsurgical hypothyroidism 07/24/2013     Lanney Gins, PT, DPT 10/21/17 12:08 PM   [facility] [address]  [place], [place], [address] Phone: [phone]   Fax:  [phone]  Name: Karen Wall MRN: [id number] Date of Birth: [birth year]

## 2017-10-23 ENCOUNTER — Encounter: Payer: Self-pay | Admitting: Physical Therapy

## 2017-10-23 ENCOUNTER — Ambulatory Visit: Payer: 59 | Admitting: Physical Therapy

## 2017-10-23 DIAGNOSIS — R2689 Other abnormalities of gait and mobility: Secondary | ICD-10-CM | POA: Diagnosis not present

## 2017-10-23 DIAGNOSIS — M25571 Pain in right ankle and joints of right foot: Secondary | ICD-10-CM | POA: Diagnosis not present

## 2017-10-23 DIAGNOSIS — M25572 Pain in left ankle and joints of left foot: Secondary | ICD-10-CM | POA: Diagnosis not present

## 2017-10-23 DIAGNOSIS — R262 Difficulty in walking, not elsewhere classified: Secondary | ICD-10-CM | POA: Diagnosis not present

## 2017-10-23 NOTE — Therapy (Signed)
[facility] [address]  [place], [place], [address] Phone: [phone]   Fax:  [phone]  Physical Therapy Treatment  Patient Details  Name: Karen Wall MRN: [id number] Date of Birth: 1955/07/30 Referring Provider: Dr. Karlton Lemon   Encounter Date: 10/23/2017  PT End of Session - 10/23/17 1020    Visit Number  2    Number of Visits  12    Date for PT Re-Evaluation  12/02/17    PT Start Time  1018    PT Stop Time  1059    PT Time Calculation (min)  41 min    Activity Tolerance  Patient tolerated treatment well    Behavior During Therapy  [facility] for tasks assessed/performed       Past Medical History:  Diagnosis Date  . GERD (gastroesophageal reflux disease)   . H/O hiatal hernia    "very small "  . Hyperlipidemia   . Numbness    Left side of face around mouth - unknown etiology  . Palpitations    Occurred during hyperthyroid  . Thyroid cancer ([place])   . Thyroid disease    hyperthyroidism    Past Surgical History:  Procedure Laterality Date  . BREAST BIOPSY    . CHOLECYSTECTOMY  1992  . THYROIDECTOMY N/A 07/03/2013   Procedure: TOTAL THYROIDECTOMY;  Surgeon: Earnstine Regal, MD;  Location: [facility];  Service: General;  Laterality: N/A;    There were no vitals filed for this visit.  Subjective Assessment - 10/23/17 1019    Subjective  doing well - no true benefit from taping    Diagnostic tests  MSK u/s: Significant thickening of posterior tibialis tendons with 5% partial tear on right at level of ankle joint, 10% partial tear on left.  Some neovascularity in both tendons.  No tenosynovitis.    Patient Stated Goals  improve pain, return to work    Currently in Pain?  Yes    Pain Score  4     Pain Location  Foot and ankles    Pain Orientation  Right;Left    Pain Type  Chronic pain                       [facility] Adult PT Treatment/Exercise - 10/23/17 1021      Manual Therapy   Manual Therapy  Soft tissue mobilization    Soft tissue mobilization  STM to B foot/ankle complex - reduced pain and swelling following this      Ankle Exercises: Aerobic   Recumbent Bike  L1 x 6 min      Ankle Exercises: Seated   Other Seated Ankle Exercises  resisted foot intrinsic - B with yellow tband x 15 each LE                  PT Long Term Goals - 10/23/17 1021      PT LONG TERM GOAL #1   Title  patient to be independent with advanced HEP    Status  On-going      PT LONG TERM GOAL #2   Title  patient to demonstrate AROM at B ankle to WNL and pain no greater than 1/10    Status  On-going      PT LONG TERM GOAL #3   Title  patient to improve B foot/ankle strength to >/= 4+/5 with pain no greater than 1/10    Status  On-going  PT LONG TERM GOAL #4   Title  patient to report ability to stand/walk for >/= 1 hour with pain no greater than 2/10    Status  On-going            Plan - 10/23/17 1021    Clinical Impression Statement  Patient noting no true benefit from taping last session - deferred today. Much time spent with STM to B foot/ankle complex with patient reporting relief in symptoms. Updated HEP to include foot intrinsic work for hopeful improved support at arch. Will continue to progress slowly and work towards more weight bearing activities in the future.     PT Treatment/Interventions  ADLs/Self Care Home Management;Cryotherapy;Electrical Stimulation;Iontophoresis 4mg /ml Dexamethasone;Moist Heat;Therapeutic exercise;Therapeutic activities;Functional mobility training;Gait training;Stair training;Ultrasound;Balance training;Neuromuscular re-education;Patient/family education;Manual techniques;Vasopneumatic Device;Taping;Dry needling;Passive range of motion    PT Next Visit Plan  non-weight bearing activities    Consulted and Agree with Plan of Care  Patient       Patient will benefit from skilled therapeutic intervention in order to improve the  following deficits and impairments:  Abnormal gait, Decreased activity tolerance, Decreased range of motion, Decreased mobility, Difficulty walking, Pain, Decreased strength  Visit Diagnosis: Pain in left ankle and joints of left foot  Pain in right ankle and joints of right foot  Difficulty walking  Other abnormalities of gait and mobility     Problem List Patient Active Problem List   Diagnosis Date Noted  . Bilateral ankle pain 10/15/2017  . IDA (iron deficiency anemia) 07/23/2016  . Vitamin D deficiency 03/15/2016  . Low vitamin B12 level 03/15/2016  . Dyspnea 05/28/2014  . Chest tightness 05/28/2014  . 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 Postsurgical hypothyroidism 07/24/2013     Lanney Gins, PT, DPT 10/23/17 1:01 PM   [facility] [address]  [place], [place], [address] Phone: [phone]   Fax:  [phone]  Name: Karen Wall MRN: [id number] Date of Birth: [birth year]

## 2017-10-28 ENCOUNTER — Encounter: Payer: Self-pay | Admitting: Physical Therapy

## 2017-10-28 ENCOUNTER — Ambulatory Visit: Payer: 59 | Admitting: Physical Therapy

## 2017-10-28 DIAGNOSIS — M25571 Pain in right ankle and joints of right foot: Secondary | ICD-10-CM

## 2017-10-28 DIAGNOSIS — R2689 Other abnormalities of gait and mobility: Secondary | ICD-10-CM | POA: Diagnosis not present

## 2017-10-28 DIAGNOSIS — M25572 Pain in left ankle and joints of left foot: Secondary | ICD-10-CM

## 2017-10-28 DIAGNOSIS — R262 Difficulty in walking, not elsewhere classified: Secondary | ICD-10-CM | POA: Diagnosis not present

## 2017-10-28 NOTE — Therapy (Signed)
[facility] [address]  [place], [place], [address] Phone: [phone]   Fax:  [phone]  Physical Therapy Treatment  Patient Details  Name: Karen Wall MRN: [id number] Date of Birth: [birth year] Referring Provider: Dr. Karlton Lemon   Encounter Date: 10/28/2017  PT End of Session - 10/28/17 1012    Visit Number  3    Number of Visits  12    Date for PT Re-Evaluation  12/02/17    PT Start Time  1010    PT Stop Time  1058    PT Time Calculation (min)  48 min    Activity Tolerance  Patient tolerated treatment well    Behavior During Therapy  [facility] for tasks assessed/performed       Past Medical History:  Diagnosis Date  . GERD (gastroesophageal reflux disease)   . H/O hiatal hernia    "very small "  . Hyperlipidemia   . Numbness    Left side of face around mouth - unknown etiology  . Palpitations    Occurred during hyperthyroid  . Thyroid cancer ([place])   . Thyroid disease    hyperthyroidism    Past Surgical History:  Procedure Laterality Date  . BREAST BIOPSY    . CHOLECYSTECTOMY  1992  . THYROIDECTOMY N/A 07/03/2013   Procedure: TOTAL THYROIDECTOMY;  Surgeon: Earnstine Regal, MD;  Location: [facility];  Service: General;  Laterality: N/A;    There were no vitals filed for this visit.  Subjective Assessment - 10/28/17 1012    Subjective  feeling about the same    Diagnostic tests  MSK u/s: Significant thickening of posterior tibialis tendons with 5% partial tear on right at level of ankle joint, 10% partial tear on left.  Some neovascularity in both tendons.  No tenosynovitis.    Patient Stated Goals  improve pain, return to work    Currently in Pain?  Yes    Pain Score  4     Pain Location  Foot and ankle    Pain Orientation  Right;Left    Pain Descriptors / Indicators  Aching;Sore;Stabbing;Sharp    Pain Type  Chronic pain                       [facility] Adult PT  Treatment/Exercise - 10/28/17 1013      Modalities   Modalities  Iontophoresis      Iontophoresis   Type of Iontophoresis  Dexamethasone    Location  L medial ankle    Dose  1.0 mL    Time  80 mA; 4-6 hours      Ankle Exercises: Aerobic   Recumbent Bike  L2 x 6 min      Ankle Exercises: Seated   Heel Raises  Both;15 reps    Toe Raise  15 reps bilateral    BAPS  Sitting;Level 3;15 reps R/L - DF/PF, inv/ev, CW    Other Seated Ankle Exercises  arch creation x 15 each LE      Ankle Exercises: Standing   Heel Raises  Both;15 reps minimal lift    Toe Raise  15 reps bilateral                  PT Long Term Goals - 10/23/17 1021      PT LONG TERM GOAL #1   Title  patient to be independent with advanced HEP    Status  On-going  PT LONG TERM GOAL #2   Title  patient to demonstrate AROM at B ankle to WNL and pain no greater than 1/10    Status  On-going      PT LONG TERM GOAL #3   Title  patient to improve B foot/ankle strength to >/= 4+/5 with pain no greater than 1/10    Status  On-going      PT LONG TERM GOAL #4   Title  patient to report ability to stand/walk for >/= 1 hour with pain no greater than 2/10    Status  On-going            Plan - 10/28/17 1013    Clinical Impression Statement  Patient with continued B ankle/foot pain with weight bearing activities. PT session continue to focus heavily on non-weight bearing tasks with good tolerance. Did introduce standing heel/toe raises with pain provocation and little height during heel raises. Did make good progress with creating an arch in seated and will plan to attempt activity in standing in upcoming visits. ionto patch applied for hopeful pain relief.     PT Treatment/Interventions  ADLs/Self Care Home Management;Cryotherapy;Electrical Stimulation;Iontophoresis 4mg /ml Dexamethasone;Moist Heat;Therapeutic exercise;Therapeutic activities;Functional mobility training;Gait training;Stair  training;Ultrasound;Balance training;Neuromuscular re-education;Patient/family education;Manual techniques;Vasopneumatic Device;Taping;Dry needling;Passive range of motion    Consulted and Agree with Plan of Care  Patient       Patient will benefit from skilled therapeutic intervention in order to improve the following deficits and impairments:  Abnormal gait, Decreased activity tolerance, Decreased range of motion, Decreased mobility, Difficulty walking, Pain, Decreased strength  Visit Diagnosis: Pain in left ankle and joints of left foot  Pain in right ankle and joints of right foot  Difficulty walking  Other abnormalities of gait and mobility     Problem List Patient Active Problem List   Diagnosis Date Noted  . Bilateral ankle pain 10/15/2017  . IDA (iron deficiency anemia) 07/23/2016  . Vitamin D deficiency 03/15/2016  . Low vitamin B12 level 03/15/2016  . Dyspnea 05/28/2014  . Chest tightness 05/28/2014  . 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 Postsurgical hypothyroidism 07/24/2013    Lanney Gins, PT, DPT 10/28/17 11:31 AM   [facility] [address]  [place], [place], [address] Phone: [phone]   Fax:  [phone]  Name: AKITA MAXIM MRN: [id number] Date of Birth: November 04, 1955

## 2017-10-28 NOTE — Patient Instructions (Signed)
Toe / Heel Raise (Sitting)    Sitting, raise heels, then rock back on heels and raise toes. Repeat __15__ times.  Foot flat - dig toes in to make arch x 15 reps  IONTOPHORESIS PATIENT PRECAUTIONS & CONTRAINDICATIONS:  . Redness under one or both electrodes can occur.  This characterized by a uniform redness that usually disappears within 12 hours of treatment. . Small pinhead size blisters may result in response to the drug.  Contact your physician if the problem persists more than 24 hours. . On rare occasions, iontophoresis therapy can result in temporary skin reactions such as rash, inflammation, irritation or burns.  The skin reactions may be the result of individual sensitivity to the ionic solution used, the condition of the skin at the start of treatment, reaction to the materials in the electrodes, allergies or sensitivity to dexamethasone, or a poor connection between the patch and your skin.  Discontinue using iontophoresis if you have any of these reactions and report to your therapist. . Remove the Patch or electrodes if you have any undue sensation of pain or burning during the treatment and report discomfort to your therapist. . Tell your Therapist if you have had known adverse reactions to the application of electrical current. . If using the Patch, the LED light will turn off when treatment is complete and the patch can be removed.  Approximate treatment time is 1-3 hours.  Remove the patch when light goes off or after 6 hours. . The Patch can be worn during normal activity, however excessive motion where the electrodes have been placed can cause poor contact between the skin and the electrode or uneven electrical current resulting in greater risk of skin irritation. Marland Kitchen Keep out of the reach of children.   . DO NOT use if you have a cardiac pacemaker or any other electrically sensitive implanted device. . DO NOT use if you have a known sensitivity to dexamethasone. . DO NOT  use during Magnetic Resonance Imaging (MRI). . DO NOT use over broken or compromised skin (e.g. sunburn, cuts, or acne) due to the increased risk of skin reaction. . DO NOT SHAVE over the area to be treated:  To establish good contact between the Patch and the skin, excessive hair may be clipped. . DO NOT place the Patch or electrodes on or over your eyes, directly over your heart, or brain. . DO NOT reuse the Patch or electrodes as this may cause burns to occur.

## 2017-10-31 ENCOUNTER — Ambulatory Visit: Payer: 59 | Admitting: Physical Therapy

## 2017-10-31 ENCOUNTER — Encounter: Payer: Self-pay | Admitting: Physical Therapy

## 2017-10-31 DIAGNOSIS — M25572 Pain in left ankle and joints of left foot: Secondary | ICD-10-CM

## 2017-10-31 DIAGNOSIS — R262 Difficulty in walking, not elsewhere classified: Secondary | ICD-10-CM | POA: Diagnosis not present

## 2017-10-31 DIAGNOSIS — M25571 Pain in right ankle and joints of right foot: Secondary | ICD-10-CM

## 2017-10-31 DIAGNOSIS — R2689 Other abnormalities of gait and mobility: Secondary | ICD-10-CM

## 2017-10-31 NOTE — Therapy (Signed)
[facility] [address]  [place], [place], [address] Phone: [phone]   Fax:  [phone]  Physical Therapy Treatment  Patient Details  Name: Karen Wall MRN: [id number] Date of Birth: 1956/02/26 Referring Provider: Dr. Karlton Lemon   Encounter Date: 10/31/2017  PT End of Session - 10/31/17 1022    Visit Number  4    Number of Visits  12    Date for PT Re-Evaluation  12/02/17    PT Start Time  1017    PT Stop Time  1058    PT Time Calculation (min)  41 min    Activity Tolerance  Patient tolerated treatment well    Behavior During Therapy  [facility] for tasks assessed/performed       Past Medical History:  Diagnosis Date  . GERD (gastroesophageal reflux disease)   . H/O hiatal hernia    "very small "  . Hyperlipidemia   . Numbness    Left side of face around mouth - unknown etiology  . Palpitations    Occurred during hyperthyroid  . Thyroid cancer ([place])   . Thyroid disease    hyperthyroidism    Past Surgical History:  Procedure Laterality Date  . BREAST BIOPSY    . CHOLECYSTECTOMY  1992  . THYROIDECTOMY N/A 07/03/2013   Procedure: TOTAL THYROIDECTOMY;  Surgeon: Earnstine Regal, MD;  Location: [facility];  Service: General;  Laterality: N/A;    There were no vitals filed for this visit.  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  Some neovascularity in both tendons.  No tenosynovitis.    Patient Stated Goals  improve pain, return to work    Currently in Pain?  Yes    Pain Score  5     Pain Location  Foot and ankle    Pain Orientation  Right;Left    Pain Descriptors / Indicators  Aching;Sore    Pain Type  Chronic pain                        [facility] Adult PT Treatment/Exercise - 10/31/17 1023      Modalities   Modalities  Iontophoresis      Iontophoresis   Type of Iontophoresis  Dexamethasone    Location  R medial ankle    Dose  1.0 mL    Time  80 mA; 4-6 hours      Ankle Exercises: Aerobic   Recumbent Bike  L1 x 6 min      Ankle Exercises: Supine   Other Supine Ankle Exercises  bridges x 12 reps      Ankle Exercises: Seated   Other Seated Ankle Exercises  arch creation x 15 each LE    Other Seated Ankle Exercises  resisted inversion/eversion - B ankle x 15 - red tband      Ankle Exercises: Standing   Heel Raises  Both;10 reps on foam    Toe Raise  15 reps B - on foam    Other Standing Ankle Exercises  high knee marching x 10 B LE - on foam    Other Standing Ankle Exercises  standing arch creation  x 10 each LE                  PT Long Term Goals - 10/23/17 1021      PT LONG TERM GOAL #1   Title  patient to be independent with advanced HEP    Status  On-going      PT LONG TERM GOAL #2   Title  patient to demonstrate AROM at B ankle to WNL and pain no greater than 1/10    Status  On-going      PT LONG TERM GOAL #3   Title  patient to improve B foot/ankle strength to >/= 4+/5 with pain no greater than 1/10    Status  On-going      PT LONG TERM GOAL #4   Title  patient to report ability to stand/walk for >/= 1 hour with pain no greater than 2/10    Status  On-going            Plan - 10/31/17 1022    Clinical Impression Statement  Patient noting benefit from ionto patch at last session - however, increased pain today as patient was able to perform a more "normal" activity regimen, which led to increased weight bearing, this increased pain today. Initiated some more standing ther ex today, however weight bearing continuing to cause pain. Will continue to progress as pateint tolerates.     PT Treatment/Interventions  ADLs/Self Care Home  Management;Cryotherapy;Electrical Stimulation;Iontophoresis 4mg /ml Dexamethasone;Moist Heat;Therapeutic exercise;Therapeutic activities;Functional mobility training;Gait training;Stair training;Ultrasound;Balance training;Neuromuscular re-education;Patient/family education;Manual techniques;Vasopneumatic Device;Taping;Dry needling;Passive range of motion    Consulted and Agree with Plan of Care  Patient       Patient will benefit from skilled therapeutic intervention in order to improve the following deficits and impairments:  Abnormal gait, Decreased activity tolerance, Decreased range of motion, Decreased mobility, Difficulty walking, Pain, Decreased strength  Visit Diagnosis: Pain in left ankle and joints of left foot  Pain in right ankle and joints of right foot  Difficulty walking  Other abnormalities of gait and mobility     Problem List Patient Active Problem List   Diagnosis Date Noted  . Bilateral ankle pain 10/15/2017  . IDA (iron deficiency anemia) 07/23/2016  . Vitamin D deficiency 03/15/2016  . Low vitamin B12 level 03/15/2016  . Dyspnea 05/28/2014  . Chest tightness 05/28/2014  . 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 Postsurgical hypothyroidism 07/24/2013     Lanney Gins, PT, DPT 10/31/17 12:39 PM   [facility] [address]  [place], [place], [address] Phone: [phone]   Fax:  [phone]  Name: Karen Wall MRN: [id number] Date of Birth: 05/17/1956

## 2017-11-04 ENCOUNTER — Ambulatory Visit: Payer: 59 | Admitting: Physical Therapy

## 2017-11-04 ENCOUNTER — Encounter: Payer: Self-pay | Admitting: Physical Therapy

## 2017-11-04 DIAGNOSIS — R2689 Other abnormalities of gait and mobility: Secondary | ICD-10-CM | POA: Diagnosis not present

## 2017-11-04 DIAGNOSIS — R262 Difficulty in walking, not elsewhere classified: Secondary | ICD-10-CM | POA: Diagnosis not present

## 2017-11-04 DIAGNOSIS — M25572 Pain in left ankle and joints of left foot: Secondary | ICD-10-CM

## 2017-11-04 DIAGNOSIS — M25571 Pain in right ankle and joints of right foot: Secondary | ICD-10-CM

## 2017-11-04 NOTE — Patient Instructions (Signed)

## 2017-11-04 NOTE — Therapy (Signed)
[facility] [address]  [place], [place], [address] Phone: [phone]   Fax:  [phone]  Physical Therapy Treatment  Patient Details  Name: Karen Wall MRN: [id number] Date of Birth: Aug 12, 1955 Referring Provider: Dr. Karlton Lemon   Encounter Date: 11/04/2017  PT End of Session - 11/04/17 1017    Visit Number  5    Number of Visits  12    Date for PT Re-Evaluation  12/02/17    PT Start Time  1012    PT Stop Time  1058    PT Time Calculation (min)  46 min    Activity Tolerance  Patient tolerated treatment well    Behavior During Therapy  [facility] for tasks assessed/performed       Past Medical History:  Diagnosis Date  . GERD (gastroesophageal reflux disease)   . H/O hiatal hernia    "very small "  . Hyperlipidemia   . Numbness    Left side of face around mouth - unknown etiology  . Palpitations    Occurred during hyperthyroid  . Thyroid cancer ([place])   . Thyroid disease    hyperthyroidism    Past Surgical History:  Procedure Laterality Date  . BREAST BIOPSY    . CHOLECYSTECTOMY  1992  . THYROIDECTOMY N/A 07/03/2013   Procedure: TOTAL THYROIDECTOMY;  Surgeon: Earnstine Regal, MD;  Location: [facility];  Service: General;  Laterality: N/A;    There were no vitals filed for this visit.  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  Some neovascularity in both tendons.  No tenosynovitis.    Patient Stated Goals  improve pain, return to work    Currently in Pain?  Yes    Pain Score  5     Pain Location  Foot and ankle    Pain Orientation  Right;Left    Pain Descriptors / Indicators  Aching;Sore;Sharp    Pain Type  Chronic pain                        [facility] Adult PT Treatment/Exercise - 11/04/17 1019      Ankle Exercises: Aerobic   Recumbent Bike  L2 x 6 min      Ankle Exercises: Machines for Strengthening   Cybex Leg Press  B calf raise - 15# x 15      Ankle Exercises: Seated   Toe Raise  15 reps red tband - resistance    Other Seated Ankle Exercises  resisted foot intrinsic - red tband x 15 reps each LE      Ankle Exercises: Standing   Toe Raise  10 reps    Other Standing Ankle Exercises  step fwd/bwd - static foot on foam x 15 reps each LE             PT Education - 11/04/17 1131    Education provided  Yes    Education Details  DN indications/education    Person(s) Educated  Patient    Methods  Explanation;Verbal cues    Comprehension  Verbalized understanding          PT Long Term Goals - 10/23/17 1021  PT LONG TERM GOAL #1   Title  patient to be independent with advanced HEP    Status  On-going      PT LONG TERM GOAL #2   Title  patient to demonstrate AROM at B ankle to WNL and pain no greater than 1/10    Status  On-going      PT LONG TERM GOAL #3   Title  patient to improve B foot/ankle strength to >/= 4+/5 with pain no greater than 1/10    Status  On-going      PT LONG TERM GOAL #4   Title  patient to report ability to stand/walk for >/= 1 hour with pain no greater than 2/10    Status  On-going            Plan - 11/04/17 1017    Clinical Impression Statement  Patient tolerable to all resistance progressions at B ankle today - updated HEP to include red tband. Also spent time discussing DN treatment today with handout provided and patient to perform self education indendently prior to trial DN. Patient reporitng improvement in symptoms in non-weight bearing, but still with pain in weight bearing positions.     PT Treatment/Interventions  ADLs/Self Care Home Management;Cryotherapy;Electrical Stimulation;Iontophoresis 4mg /ml Dexamethasone;Moist  Heat;Therapeutic exercise;Therapeutic activities;Functional mobility training;Gait training;Stair training;Ultrasound;Balance training;Neuromuscular re-education;Patient/family education;Manual techniques;Vasopneumatic Device;Taping;Dry needling;Passive range of motion    Consulted and Agree with Plan of Care  Patient       Patient will benefit from skilled therapeutic intervention in order to improve the following deficits and impairments:  Abnormal gait, Decreased activity tolerance, Decreased range of motion, Decreased mobility, Difficulty walking, Pain, Decreased strength  Visit Diagnosis: Pain in left ankle and joints of left foot  Pain in right ankle and joints of right foot  Difficulty walking  Other abnormalities of gait and mobility     Problem List Patient Active Problem List   Diagnosis Date Noted  . Bilateral ankle pain 10/15/2017  . IDA (iron deficiency anemia) 07/23/2016  . Vitamin D deficiency 03/15/2016  . Low vitamin B12 level 03/15/2016  . Dyspnea 05/28/2014  . Chest tightness 05/28/2014  . 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 Postsurgical hypothyroidism 07/24/2013     Lanney Gins, PT, DPT 11/04/17 11:32 AM   [facility] [address]  [place], [place], [address] Phone: [phone]   Fax:  [phone]  Name: LETICIA COLETTA MRN: [id number] Date of Birth: 10-10-1955

## 2017-11-07 ENCOUNTER — Ambulatory Visit: Payer: 59

## 2017-11-07 DIAGNOSIS — M25571 Pain in right ankle and joints of right foot: Secondary | ICD-10-CM

## 2017-11-07 DIAGNOSIS — R2689 Other abnormalities of gait and mobility: Secondary | ICD-10-CM | POA: Diagnosis not present

## 2017-11-07 DIAGNOSIS — R262 Difficulty in walking, not elsewhere classified: Secondary | ICD-10-CM | POA: Diagnosis not present

## 2017-11-07 DIAGNOSIS — M25572 Pain in left ankle and joints of left foot: Secondary | ICD-10-CM

## 2017-11-07 NOTE — Therapy (Signed)
[facility] [address]  [place], [place], [address] Phone: [phone]   Fax:  [phone]  Physical Therapy Treatment  Patient Details  Name: Karen Wall MRN: [id number] Date of Birth: 21-Feb-1956 Referring Provider: Dr. Karlton Lemon   Encounter Date: 11/07/2017  PT End of Session - 11/07/17 1021    Visit Number  6    Number of Visits  12    Date for PT Re-Evaluation  12/02/17    PT Start Time  7989    PT Stop Time  1101    PT Time Calculation (min)  46 min    Activity Tolerance  Patient tolerated treatment well    Behavior During Therapy  [facility] for tasks assessed/performed       Past Medical History:  Diagnosis Date  . GERD (gastroesophageal reflux disease)   . H/O hiatal hernia    "very small "  . Hyperlipidemia   . Numbness    Left side of face around mouth - unknown etiology  . Palpitations    Occurred during hyperthyroid  . Thyroid cancer ([place])   . Thyroid disease    hyperthyroidism    Past Surgical History:  Procedure Laterality Date  . BREAST BIOPSY    . CHOLECYSTECTOMY  1992  . THYROIDECTOMY N/A 07/03/2013   Procedure: TOTAL THYROIDECTOMY;  Surgeon: Earnstine Regal, MD;  Location: [facility];  Service: General;  Laterality: N/A;    There were no vitals filed for this visit.  Subjective Assessment - 11/07/17 1018    Subjective  Pt. reporting some improvement in pain levels while sitting and less pain while walking.      Diagnostic tests  MSK u/s: Significant thickening of posterior tibialis tendons with 5% partial tear on right at level of ankle joint, 10% partial tear on left.  Some neovascularity in both tendons.  No tenosynovitis.    Patient Stated Goals  improve pain, return to work    Currently in Pain?  No/denies    Pain Score  0-No pain 3-4/10 B foot pain at worst when walking                        [facility] Adult PT Treatment/Exercise - 11/07/17 1028      Ankle  Exercises: Machines for Strengthening   Cybex Leg Press  B calf raise - 20# x 15      Ankle Exercises: Seated   Towel Crunch  3 reps B     Heel Raises  Both;20 reps    Toe Raise  20 reps    BAPS  Sitting;Level 3 x 20 reps ; Front/back, R/L, CW, CCW - peg at PL pole       Ankle Exercises: Aerobic   Recumbent Bike  L2 x 6 min      Ankle Exercises: Standing   Heel Raises  3 seconds;10 reps stopped at 10 reps due to increase in pain     Toe Raise  3 seconds;10 reps stopped at 10 reps due to rise in pain    Other Standing Ankle Exercises  Side step up and over airex pad x 10 reps at counter                   PT Long Term Goals - 10/23/17 1021      PT LONG TERM GOAL #1   Title  patient to be independent with advanced HEP  Status  On-going      PT LONG TERM GOAL #2   Title  patient to demonstrate AROM at B ankle to WNL and pain no greater than 1/10    Status  On-going      PT LONG TERM GOAL #3   Title  patient to improve B foot/ankle strength to >/= 4+/5 with pain no greater than 1/10    Status  On-going      PT LONG TERM GOAL #4   Title  patient to report ability to stand/walk for >/= 1 hour with pain no greater than 2/10    Status  On-going            Plan - 11/07/17 1021    Clinical Impression Statement  Pt. reporting she had increased soreness at B foot for one day following last visit.  Has felt decrease in pain levels with walking last few days.  No longer having constant pain with seated activities at home.  Tolerated mild progression in BAPS board and standing proprioception activities today with what she described as "typical" rise in pain at B feet.  Ended treatment with low-level pain and pt. opting to skip ionto patch.  Will continue to progress toward goals.      PT Treatment/Interventions  ADLs/Self Care Home Management;Cryotherapy;Electrical Stimulation;Iontophoresis 4mg /ml Dexamethasone;Moist Heat;Therapeutic exercise;Therapeutic  activities;Functional mobility training;Gait training;Stair training;Ultrasound;Balance training;Neuromuscular re-education;Patient/family education;Manual techniques;Vasopneumatic Device;Taping;Dry needling;Passive range of motion    PT Next Visit Plan  non-weight bearing activities    Consulted and Agree with Plan of Care  Patient       Patient will benefit from skilled therapeutic intervention in order to improve the following deficits and impairments:  Abnormal gait, Decreased activity tolerance, Decreased range of motion, Decreased mobility, Difficulty walking, Pain, Decreased strength  Visit Diagnosis: Pain in left ankle and joints of left foot  Pain in right ankle and joints of right foot  Difficulty walking  Other abnormalities of gait and mobility     Problem List Patient Active Problem List   Diagnosis Date Noted  . Bilateral ankle pain 10/15/2017  . IDA (iron deficiency anemia) 07/23/2016  . Vitamin D deficiency 03/15/2016  . Low vitamin B12 level 03/15/2016  . Dyspnea 05/28/2014  . Chest tightness 05/28/2014  . 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 Postsurgical hypothyroidism 07/24/2013    Bess Harvest, PTA 11/07/17 12:43 PM  [facility] [address]  [place], [place], [address] Phone: [phone]   Fax:  [phone]  Name: MADELYN TLATELPA MRN: [id number] Date of Birth: Jul 09, 1956

## 2017-11-08 ENCOUNTER — Encounter: Payer: Self-pay | Admitting: Family Medicine

## 2017-11-08 DIAGNOSIS — D75839 Thrombocytosis, unspecified: Secondary | ICD-10-CM

## 2017-11-08 DIAGNOSIS — D473 Essential (hemorrhagic) thrombocythemia: Secondary | ICD-10-CM

## 2017-11-08 DIAGNOSIS — Z862 Personal history of diseases of the blood and blood-forming organs and certain disorders involving the immune mechanism: Secondary | ICD-10-CM

## 2017-11-11 ENCOUNTER — Ambulatory Visit: Payer: 59

## 2017-11-11 DIAGNOSIS — R2689 Other abnormalities of gait and mobility: Secondary | ICD-10-CM

## 2017-11-11 DIAGNOSIS — M25572 Pain in left ankle and joints of left foot: Secondary | ICD-10-CM

## 2017-11-11 DIAGNOSIS — M25571 Pain in right ankle and joints of right foot: Secondary | ICD-10-CM | POA: Diagnosis not present

## 2017-11-11 DIAGNOSIS — R262 Difficulty in walking, not elsewhere classified: Secondary | ICD-10-CM | POA: Diagnosis not present

## 2017-11-11 NOTE — Therapy (Signed)
[facility] [address]  [place], [place], [address] Phone: [phone]   Fax:  [phone]  Physical Therapy Treatment  Patient Details  Name: Karen Wall MRN: [id number] Date of Birth: 05-08-1956 Referring Provider: Dr. Karlton Lemon   Encounter Date: 11/11/2017  PT End of Session - 11/11/17 1022    Visit Number  7    Number of Visits  12    Date for PT Re-Evaluation  12/02/17    PT Start Time  1017    PT Stop Time  1100    PT Time Calculation (min)  43 min    Activity Tolerance  Patient tolerated treatment well    Behavior During Therapy  [facility] for tasks assessed/performed       Past Medical History:  Diagnosis Date  . GERD (gastroesophageal reflux disease)   . H/O hiatal hernia    "very small "  . Hyperlipidemia   . Numbness    Left side of face around mouth - unknown etiology  . Palpitations    Occurred during hyperthyroid  . Thyroid cancer ([place])   . Thyroid disease    hyperthyroidism    Past Surgical History:  Procedure Laterality Date  . BREAST BIOPSY    . CHOLECYSTECTOMY  1992  . THYROIDECTOMY N/A 07/03/2013   Procedure: TOTAL THYROIDECTOMY;  Surgeon: Earnstine Regal, MD;  Location: [facility];  Service: General;  Laterality: N/A;    There were no vitals filed for this visit.  Subjective Assessment - 11/11/17 1017    Subjective  Had some increased foot soreness after last visit however reports performed HEP daily.      Diagnostic tests  MSK u/s: Significant thickening of posterior tibialis tendons with 5% partial tear on right at level of ankle joint, 10% partial tear on left.  Some neovascularity in both tendons.  No tenosynovitis.    Patient Stated Goals  improve pain, return to work    Currently in Pain?  Yes    Pain Score  1  Rises to 4/10 while walking     Pain Location  Foot R > L     Pain Orientation  Right;Left    Pain Descriptors / Indicators  Aching;Sore;Sharp    Pain Type   Chronic pain    Pain Onset  More than a month ago    Pain Frequency  Intermittent    Aggravating Factors   standing, walking, weight bearing     Pain Relieving Factors  nitro patches     Multiple Pain Sites  No                       [facility] Adult PT Treatment/Exercise - 11/11/17 1030      Iontophoresis   Type of Iontophoresis  Dexamethasone    Location  R medial ankle 2#/6 at R     Dose  1.0 mL    Time  80 mA; 4-6 hours      Manual Therapy   Manual Therapy  Soft tissue mobilization    Manual therapy comments  Supine     Soft tissue mobilization  STM to B foot with report of reduce pain following this       Ankle Exercises: Seated   Heel Raises  Both;20 reps added resistance with UE pushing on knees    Toe Raise  --    BAPS  Sitting;Level 3 x 20 reps; R/L, CW, CCW;  peg at AL pole       Ankle Exercises: Aerobic   Recumbent Bike  L2 x 6 min      Ankle Exercises: Standing   Side Shuffle (Round Trip)  Side stepping with yellow TB at forefoot x 3 laps at counter     Other Standing Ankle Exercises  Alternating toe-taps to cones standing on airex pad x 10 reps each LE; 2 ski poles; reported moderate pain following this                   PT Long Term Goals - 10/23/17 1021      PT LONG TERM GOAL #1   Title  patient to be independent with advanced HEP    Status  On-going      PT LONG TERM GOAL #2   Title  patient to demonstrate AROM at B ankle to WNL and pain no greater than 1/10    Status  On-going      PT LONG TERM GOAL #3   Title  patient to improve B foot/ankle strength to >/= 4+/5 with pain no greater than 1/10    Status  On-going      PT LONG TERM GOAL #4   Title  patient to report ability to stand/walk for >/= 1 hour with pain no greater than 2/10    Status  On-going            Plan - 11/11/17 1025    Clinical Impression Statement  Pt. reporting some increased soreness following last visit with wt. bearing activities however reports  daily adherence to HEP noting, "I just want this to get better".  Still with limited tolerance for wt. bearing activities in treatment however tolerated all seated strengthening and ROM activities well today.  Pt. requesting ionto patch to R medial ankle to end treatment as she notes good pain relief from this in past visits.  Will continue to progress toward goals per pt. tolerance in coming visits.    PT Treatment/Interventions  ADLs/Self Care Home Management;Cryotherapy;Electrical Stimulation;Iontophoresis 4mg /ml Dexamethasone;Moist Heat;Therapeutic exercise;Therapeutic activities;Functional mobility training;Gait training;Stair training;Ultrasound;Balance training;Neuromuscular re-education;Patient/family education;Manual techniques;Vasopneumatic Device;Taping;Dry needling;Passive range of motion    Consulted and Agree with Plan of Care  Patient       Patient will benefit from skilled therapeutic intervention in order to improve the following deficits and impairments:  Abnormal gait, Decreased activity tolerance, Decreased range of motion, Decreased mobility, Difficulty walking, Pain, Decreased strength  Visit Diagnosis: Pain in left ankle and joints of left foot  Pain in right ankle and joints of right foot  Difficulty walking  Other abnormalities of gait and mobility     Problem List Patient Active Problem List   Diagnosis Date Noted  . Bilateral ankle pain 10/15/2017  . IDA (iron deficiency anemia) 07/23/2016  . Vitamin D deficiency 03/15/2016  . Low vitamin B12 level 03/15/2016  . Dyspnea 05/28/2014  . Chest tightness 05/28/2014  . 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 Postsurgical hypothyroidism 07/24/2013    Bess Harvest, PTA 11/11/17 12:20 PM  [facility] [address]  [place], [place], [address] Phone: [phone]   Fax:  [phone]  Name: Karen Wall MRN:  [id number] Date of Birth: [birth year]

## 2017-11-14 ENCOUNTER — Other Ambulatory Visit (INDEPENDENT_AMBULATORY_CARE_PROVIDER_SITE_OTHER): Payer: 59

## 2017-11-14 ENCOUNTER — Ambulatory Visit: Payer: 59 | Attending: Family Medicine | Admitting: Physical Therapy

## 2017-11-14 ENCOUNTER — Encounter: Payer: Self-pay | Admitting: Physical Therapy

## 2017-11-14 DIAGNOSIS — R262 Difficulty in walking, not elsewhere classified: Secondary | ICD-10-CM | POA: Diagnosis not present

## 2017-11-14 DIAGNOSIS — Z862 Personal history of diseases of the blood and blood-forming organs and certain disorders involving the immune mechanism: Secondary | ICD-10-CM | POA: Diagnosis not present

## 2017-11-14 DIAGNOSIS — M25571 Pain in right ankle and joints of right foot: Secondary | ICD-10-CM | POA: Diagnosis not present

## 2017-11-14 DIAGNOSIS — D75839 Thrombocytosis, unspecified: Secondary | ICD-10-CM

## 2017-11-14 DIAGNOSIS — R2689 Other abnormalities of gait and mobility: Secondary | ICD-10-CM | POA: Insufficient documentation

## 2017-11-14 DIAGNOSIS — D473 Essential (hemorrhagic) thrombocythemia: Secondary | ICD-10-CM

## 2017-11-14 DIAGNOSIS — M25572 Pain in left ankle and joints of left foot: Secondary | ICD-10-CM | POA: Insufficient documentation

## 2017-11-14 LAB — CBC
HCT: 36.9 % (ref 36.0–46.0)
HEMOGLOBIN: 12.2 g/dL (ref 12.0–15.0)
MCHC: 33.1 g/dL (ref 30.0–36.0)
MCV: 78.2 fl (ref 78.0–100.0)
Platelets: 532 10*3/uL — ABNORMAL HIGH (ref 150.0–400.0)
RBC: 4.72 Mil/uL (ref 3.87–5.11)
RDW: 15.5 % (ref 11.5–15.5)
WBC: 7.5 10*3/uL (ref 4.0–10.5)

## 2017-11-14 LAB — FERRITIN: FERRITIN: 58.8 ng/mL (ref 10.0–291.0)

## 2017-11-14 NOTE — Therapy (Signed)
[facility] [address]  [place], [place], [address] Phone: [phone]   Fax:  [phone]  Physical Therapy Treatment  Patient Details  Name: Karen Wall MRN: [id number] Date of Birth: September 18, 1955 Referring Provider: Dr. Karlton Lemon   Encounter Date: 11/14/2017  PT End of Session - 11/14/17 1157    Visit Number  8    Number of Visits  12    Date for PT Re-Evaluation  12/02/17    PT Start Time  3662    PT Stop Time  1102    PT Time Calculation (min)  47 min    Activity Tolerance  Patient tolerated treatment well    Behavior During Therapy  [facility] for tasks assessed/performed       Past Medical History:  Diagnosis Date  . GERD (gastroesophageal reflux disease)   . H/O hiatal hernia    "very small "  . Hyperlipidemia   . Numbness    Left side of face around mouth - unknown etiology  . Palpitations    Occurred during hyperthyroid  . Thyroid cancer ([place])   . Thyroid disease    hyperthyroidism    Past Surgical History:  Procedure Laterality Date  . BREAST BIOPSY    . CHOLECYSTECTOMY  1992  . THYROIDECTOMY N/A 07/03/2013   Procedure: TOTAL THYROIDECTOMY;  Surgeon: Earnstine Regal, MD;  Location: [facility];  Service: General;  Laterality: N/A;    There were no vitals filed for this visit.  Subjective Assessment - 11/14/17 1019    Subjective  L foot is feeling well - R still quite bothersome    Diagnostic tests  MSK u/s: Significant thickening of posterior tibialis tendons with 5% partial tear on right at level of ankle joint, 10% partial tear on left.  Some neovascularity in both tendons.  No tenosynovitis.    Patient Stated Goals  improve pain, return to work    Currently in Pain?  Yes    Pain Score  2  R Foot  + shooting pain    Pain Location  Foot    Pain Orientation  Right;Left    Pain Descriptors / Indicators  Aching;Shooting    Pain Type  Chronic pain                        [facility] Adult PT Treatment/Exercise - 11/14/17 0001      Iontophoresis   Type of Iontophoresis  Dexamethasone    Location  R medial ankle    Dose  1.0 mL    Time  80 mA; 4-6 hours      Ankle Exercises: Aerobic   Recumbent Bike  L2 x 6 min      Ankle Exercises: Standing   Other Standing Ankle Exercises  stepping forward/backward - static foot on airex x 15 B LE    Other Standing Ankle Exercises  Side step up and over airex pad x 10 reps      Ankle Exercises: Stretches   Gastroc Stretch  3 reps;20 seconds    Gastroc Stretch Limitations  B LE; Prostretch with opposite LE on small step      Ankle Exercises: Seated   Other Seated Ankle Exercises  Resisted PF/DF x15 each B LEs with green TB     Other Seated Ankle Exercises  INV/EV over small dowel;  PT Long Term Goals - 10/23/17 1021      PT LONG TERM GOAL #1   Title  patient to be independent with advanced HEP    Status  On-going      PT LONG TERM GOAL #2   Title  patient to demonstrate AROM at B ankle to WNL and pain no greater than 1/10    Status  On-going      PT LONG TERM GOAL #3   Title  patient to improve B foot/ankle strength to >/= 4+/5 with pain no greater than 1/10    Status  On-going      PT LONG TERM GOAL #4   Title  patient to report ability to stand/walk for >/= 1 hour with pain no greater than 2/10    Status  On-going            Plan - 11/14/17 1030    Clinical Impression Statement  Patient reports persisting R LE pain- L LE is a bit better. Patient with c/o pain during standing WBing exercises on airex, esp R LE. Requires rest breaks to do sitting ther-ex due to foot pain; able to perform sitting ther-ex without pain and with good technique. Patient requesting iontophoresis to R medial foot due to relief of pain last visit. Patient aware of iontophoresis precautions and wear time.      PT Treatment/Interventions  ADLs/Self Care Home  Management;Cryotherapy;Electrical Stimulation;Iontophoresis 4mg /ml Dexamethasone;Moist Heat;Therapeutic exercise;Therapeutic activities;Functional mobility training;Gait training;Stair training;Ultrasound;Balance training;Neuromuscular re-education;Patient/family education;Manual techniques;Vasopneumatic Device;Taping;Dry needling;Passive range of motion    Consulted and Agree with Plan of Care  Patient       Patient will benefit from skilled therapeutic intervention in order to improve the following deficits and impairments:  Abnormal gait, Decreased activity tolerance, Decreased range of motion, Decreased mobility, Difficulty walking, Pain, Decreased strength  Visit Diagnosis: Pain in left ankle and joints of left foot  Pain in right ankle and joints of right foot  Difficulty walking  Other abnormalities of gait and mobility     Problem List Patient Active Problem List   Diagnosis Date Noted  . Bilateral ankle pain 10/15/2017  . IDA (iron deficiency anemia) 07/23/2016  . Vitamin D deficiency 03/15/2016  . Low vitamin B12 level 03/15/2016  . Dyspnea 05/28/2014  . Chest tightness 05/28/2014  . 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 Postsurgical hypothyroidism 07/24/2013    Janene Harvey, PT, DPT  Lanney Gins, PT, DPT 11/14/17 12:11 PM  [facility] [address]  [place], [place], [address] Phone: [phone]   Fax:  [phone]  Name: DEL WISEMAN MRN: [id number] Date of Birth: Jun 08, 1956

## 2017-11-15 ENCOUNTER — Encounter: Payer: Self-pay | Admitting: Family Medicine

## 2017-11-20 ENCOUNTER — Ambulatory Visit: Payer: 59

## 2017-11-20 DIAGNOSIS — R2689 Other abnormalities of gait and mobility: Secondary | ICD-10-CM | POA: Diagnosis not present

## 2017-11-20 DIAGNOSIS — M25572 Pain in left ankle and joints of left foot: Secondary | ICD-10-CM | POA: Diagnosis not present

## 2017-11-20 DIAGNOSIS — M25571 Pain in right ankle and joints of right foot: Secondary | ICD-10-CM | POA: Diagnosis not present

## 2017-11-20 DIAGNOSIS — R262 Difficulty in walking, not elsewhere classified: Secondary | ICD-10-CM

## 2017-11-20 NOTE — Therapy (Addendum)
[facility] [address]  [place], [place], [address] Phone: [phone]   Fax:  [phone]  Physical Therapy Treatment  Patient Details  Name: Karen Wall MRN: [id number] Date of Birth: [birth year] Referring Provider: Dr. Karlton Lemon   Encounter Date: 11/20/2017  PT End of Session - 11/20/17 1100    Visit Number  9    Number of Visits  12    Date for PT Re-Evaluation  12/02/17    PT Start Time  1100    PT Stop Time  1150    PT Time Calculation (min)  50 min    Activity Tolerance  Patient tolerated treatment well    Behavior During Therapy  [facility] for tasks assessed/performed       Past Medical History:  Diagnosis Date  . GERD (gastroesophageal reflux disease)   . H/O hiatal hernia    "very small "  . Hyperlipidemia   . Numbness    Left side of face around mouth - unknown etiology  . Palpitations    Occurred during hyperthyroid  . Thyroid cancer ([place])   . Thyroid disease    hyperthyroidism    Past Surgical History:  Procedure Laterality Date  . BREAST BIOPSY    . CHOLECYSTECTOMY  1992  . THYROIDECTOMY N/A 07/03/2013   Procedure: TOTAL THYROIDECTOMY;  Surgeon: Earnstine Regal, MD;  Location: [facility];  Service: General;  Laterality: N/A;    There were no vitals filed for this visit.  Subjective Assessment - 11/20/17 1106    Subjective  Reports L lateral foot is giving her more pain today than R.      Diagnostic tests  MSK u/s: Significant thickening of posterior tibialis tendons with 5% partial tear on right at level of ankle joint, 10% partial tear on left.  Some neovascularity in both tendons.  No tenosynovitis.    Patient Stated Goals  improve pain, return to work    Currently in Pain?  Yes    Pain Score  2     Pain Location  Foot    Pain Orientation  Left;Lateral    Pain Descriptors / Indicators  Aching         [facility] PT Assessment - 11/20/17 1125      AROM   Right/Left Ankle   Right;Left    Right Ankle Dorsiflexion  11    Right Ankle Plantar Flexion  46    Right Ankle Inversion  34    Right Ankle Eversion  8    Left Ankle Dorsiflexion  6    Left Ankle Plantar Flexion  48    Left Ankle Inversion  39    Left Ankle Eversion  17      Strength   Overall Strength Comments  gross strength at B ankle 4/5 to 4+/5 with pain provocation in B PF, EV     Strength Assessment Site  Ankle    Right/Left Ankle  Right;Left                   [facility] Adult PT Treatment/Exercise - 11/20/17 1112      Iontophoresis   Type of Iontophoresis  Dexamethasone    Location  R medial ankle #4/6 patch at R ankle    Dose  1.0 mL    Time  80 mA; 4-6 hours      Manual Therapy   Manual Therapy  Soft tissue mobilization;Myofascial release    Manual  therapy comments  supine     Soft tissue mobilization  STM to L mid peroneals; ttp    Myofascial Release  TPR to mid peroneals       Ankle Exercises: Aerobic   Nustep  lvl 4, 6 min       Ankle Exercises: Standing   Heel Raises  3 seconds;10 reps    Other Standing Ankle Exercises  Forward step up onto airex pad x 10 reps eah[address] way  B foot pain rising to 4/10 to end set       Ankle Exercises: Stretches   Gastroc Stretch  3 reps;20 seconds    Gastroc Stretch Limitations  leaning into wall       Ankle Exercises: Seated   Other Seated Ankle Exercises  B EV with yellow TB at ankles x 20 reps; improved ROM with yellow TB as compared to red    Other Seated Ankle Exercises  B DF/ IV with red TB x 15 reps              PT Education - 11/20/17 1200    Education provided  Yes    Education Details  Standing gastroc stretch into wall     Person(s) Educated  Patient    Methods  Explanation;Demonstration;Verbal cues;Handout    Comprehension  Verbalized understanding;Returned demonstration;Verbal cues required;Need further instruction          PT Long Term Goals - 11/20/17 1133      PT LONG TERM GOAL #1   Title  patient to  be independent with advanced HEP    Status  Partially Met met for current HEP       PT LONG TERM GOAL #2   Title  patient to demonstrate AROM at B ankle to WNL and pain no greater than 1/10    Status  Partially Met pain rising to 2/10 with eversion B       PT LONG TERM GOAL #3   Title  patient to improve B foot/ankle strength to >/= 4+/5 with pain no greater than 1/10    Status  Partially Met Partially met ankle strength however still with pain rising to 2/10 with max resistance EV       PT LONG TERM GOAL #4   Title  patient to report ability to stand/walk for >/= 1 hour with pain no greater than 2/10    Status  On-going Pt. still reporting pain rising to 4/10 following 10 min standing             Plan - 11/20/17 1100    Clinical Impression Statement  Pt. reporting she has seen some improvement in tolerance for walking barefooted around home since starting therapy.  Does still have B ankle/foot pain rising to 4/10 following 10 min walking or standing.  Still demonstrating limited tolerance for WB activities in treatment due to pain levels.  Able to demo improvement in B ankle strength today partially meeting goal with MMT.  Pt. able demo improvement in B ankle AROM in nearly all motions since starting therapy partially meeting goal.  Pt. still noting most limitation with walking and standing tolerance at this point due to pain with increased pain over weekend with shopping.  Pt. to see MD for f/u at beginning of next week and to determine whether to continue with therapy.  Demonstrating progress toward goals at this point.      PT Treatment/Interventions  ADLs/Self Care Home Management;Cryotherapy;Electrical Stimulation;Iontophoresis '4mg'$ /ml Dexamethasone;Moist Heat;Therapeutic exercise;Therapeutic activities;Functional  mobility training;Gait training;Stair training;Ultrasound;Balance training;Neuromuscular re-education;Patient/family education;Manual techniques;Vasopneumatic Device;Taping;Dry  needling;Passive range of motion    PT Next Visit Plan  non-weight bearing activities       Patient will benefit from skilled therapeutic intervention in order to improve the following deficits and impairments:  Abnormal gait, Decreased activity tolerance, Decreased range of motion, Decreased mobility, Difficulty walking, Pain, Decreased strength  Visit Diagnosis: Pain in left ankle and joints of left foot  Pain in right ankle and joints of right foot  Difficulty walking  Other abnormalities of gait and mobility     Problem List Patient Active Problem List   Diagnosis Date Noted  . Bilateral ankle pain 10/15/2017  . IDA (iron deficiency anemia) 07/23/2016  . Vitamin D deficiency 03/15/2016  . Low vitamin B12 level 03/15/2016  . Dyspnea 05/28/2014  . Chest tightness 05/28/2014  . 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 Postsurgical hypothyroidism 07/24/2013    Bess Harvest, PTA 11/20/17 12:16 PM  [facility] [address]  [place], [place], [address] Phone: [phone]   Fax:  [phone]  Name: Karen Wall MRN: [id number] Date of Birth: 1955/09/10  PHYSICAL THERAPY DISCHARGE SUMMARY  Visits from Start of Care: 9  Current functional level related to goals / functional outcomes: See above, unable to assess as patient did not return for subsequent visits   Remaining deficits: See above   Education / Equipment: See above  Plan: Patient agrees to discharge.  Patient goals were not met. Patient is being discharged due to not returning since the last visit.  ?????     Janene Harvey, PT, DPT 01/22/18 1:21 PM

## 2017-11-21 ENCOUNTER — Other Ambulatory Visit: Payer: Self-pay | Admitting: *Deleted

## 2017-11-21 DIAGNOSIS — D500 Iron deficiency anemia secondary to blood loss (chronic): Secondary | ICD-10-CM

## 2017-11-22 ENCOUNTER — Inpatient Hospital Stay: Payer: 59 | Attending: Hematology & Oncology | Admitting: Hematology & Oncology

## 2017-11-22 ENCOUNTER — Inpatient Hospital Stay: Payer: 59

## 2017-11-22 ENCOUNTER — Encounter: Payer: Self-pay | Admitting: Hematology & Oncology

## 2017-11-22 ENCOUNTER — Other Ambulatory Visit: Payer: Self-pay

## 2017-11-22 VITALS — BP 136/82 | HR 73 | Temp 98.2°F | Resp 16 | Wt 220.0 lb

## 2017-11-22 DIAGNOSIS — D473 Essential (hemorrhagic) thrombocythemia: Secondary | ICD-10-CM | POA: Insufficient documentation

## 2017-11-22 DIAGNOSIS — D509 Iron deficiency anemia, unspecified: Secondary | ICD-10-CM | POA: Insufficient documentation

## 2017-11-22 DIAGNOSIS — D75839 Thrombocytosis, unspecified: Secondary | ICD-10-CM | POA: Insufficient documentation

## 2017-11-22 DIAGNOSIS — D500 Iron deficiency anemia secondary to blood loss (chronic): Secondary | ICD-10-CM | POA: Diagnosis not present

## 2017-11-22 DIAGNOSIS — K909 Intestinal malabsorption, unspecified: Secondary | ICD-10-CM

## 2017-11-22 HISTORY — DX: Thrombocytosis, unspecified: D75.839

## 2017-11-22 HISTORY — DX: Intestinal malabsorption, unspecified: K90.9

## 2017-11-22 LAB — CBC WITH DIFFERENTIAL (CANCER CENTER ONLY)
BASOS ABS: 0 10*3/uL (ref 0.0–0.1)
BASOS PCT: 0 %
Eosinophils Absolute: 0.1 10*3/uL (ref 0.0–0.5)
Eosinophils Relative: 1 %
HEMATOCRIT: 39.1 % (ref 34.8–46.6)
HEMOGLOBIN: 12.7 g/dL (ref 11.6–15.9)
Lymphocytes Relative: 29 %
Lymphs Abs: 2.5 10*3/uL (ref 0.9–3.3)
MCH: 25.8 pg — ABNORMAL LOW (ref 26.0–34.0)
MCHC: 32.5 g/dL (ref 32.0–36.0)
MCV: 79.3 fL — ABNORMAL LOW (ref 81.0–101.0)
Monocytes Absolute: 0.9 10*3/uL (ref 0.1–0.9)
Monocytes Relative: 10 %
NEUTROS ABS: 5.3 10*3/uL (ref 1.5–6.5)
Neutrophils Relative %: 60 %
Platelet Count: 560 10*3/uL — ABNORMAL HIGH (ref 145–400)
RBC: 4.93 MIL/uL (ref 3.70–5.32)
RDW: 15.7 % (ref 11.1–15.7)
WBC Count: 8.9 10*3/uL (ref 3.9–10.0)

## 2017-11-22 LAB — FERRITIN: FERRITIN: 48 ng/mL (ref 9–269)

## 2017-11-22 LAB — IRON AND TIBC
IRON: 51 ug/dL (ref 41–142)
Saturation Ratios: 15 % — ABNORMAL LOW (ref 21–57)
TIBC: 343 ug/dL (ref 236–444)
UIBC: 292 ug/dL

## 2017-11-22 LAB — SAVE SMEAR

## 2017-11-22 NOTE — Progress Notes (Signed)
Hematology and Oncology Follow Up Visit  Karen Wall [id number] June 23, 1956 62 y.o. 11/22/2017   Principle Diagnosis:   Thrombocytosis-iron deficiency  Current Therapy:    IV iron as indicated     Interim History:  [name] is back for her second office visit.  We first saw her back in January.  At that time, I really did not think there was much of an issue with respect to her thrombocytosis.   When we first saw her, her platelet count was 468,000.  Since then, has been going up gradually.  A week ago, it was 532,000.  She is not anemic.  She does have a low MCV.    We did send iron studies off.  Her ferritin is only 48 with iron saturation of 15%.  She is been taking oral iron.  I do not think this is really working.  I am not sure why she is not absorbing iron.  She is had no obvious GI bleeding.  She has had no weight loss or weight gain.  She is had no rashes.  There is been no nausea or vomiting.  She has had no fever.  There is been no pain in her hands or feet.  She has had no headache.  She has had issues with her feet.  She has bad tendinitis in her feet.  Hopefully she will not need surgery.  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  I really feel bad that she cannot do her job.  She works at [employer] up on [address].  Overall, her performance status is ECOG 1.  Medications:  Current Outpatient Medications:  .  acetaminophen (TYLENOL) 500 MG tablet, Take 500 mg by mouth every 6 (six) hours as needed for pain., Disp: , Rfl:  .  Cholecalciferol (VITAMIN D3) 5000 units CAPS, Take 1 capsule by mouth daily., Disp: , Rfl:  .  Cyanocobalamin (VITAMIN B-12 PO), Take by mouth., Disp: , Rfl:  .  IBUPROFEN PO, Take 200 mg by mouth as needed. , Disp: , Rfl:  .  levothyroxine (SYNTHROID, LEVOTHROID) 100 MCG tablet, TAKE 1 TABLET (100 MCG TOTAL) BY MOUTH DAILY., Disp: 45 tablet, Rfl: 1 .  liothyronine (CYTOMEL) 5 MCG tablet, TAKE 1  TABLET (5 MCG TOTAL) BY MOUTH DAILY., Disp: 45 tablet, Rfl: 1 .  LORazepam (ATIVAN) 1 MG tablet, Take 1/2 or 1 tablet at bedtime as needed for insomnia, Disp: 30 tablet, Rfl: 2 .  nitroGLYCERIN (NITRODUR - DOSED IN MG/24 HR) 0.2 mg/hr patch, Apply 1/4th patch to affected ankle, change daily, Disp: 30 patch, Rfl: 1  Allergies:  Allergies  Allergen Reactions  . Penicillins     unknown    Past Medical History, Surgical history, Social history, and Family History were reviewed and updated.  Review of Systems: Review of Systems  Constitutional: Negative.   HENT:  Negative.   Eyes: Negative.   Respiratory: Negative.   Cardiovascular: Negative.   Gastrointestinal: Negative.   Endocrine: Negative.   Genitourinary: Negative.    Musculoskeletal: Positive for arthralgias and myalgias.  Skin: Negative.   Neurological: Negative.   Hematological: Negative.   Psychiatric/Behavioral: Negative.     Physical Exam:  weight is 220 lb (99.8 kg). Her oral temperature is 98.2 F (36.8 C). Her blood pressure is 136/82 and her pulse is 73. Her respiration is 16 and oxygen saturation is 96%.   Wt Readings from Last 3 Encounters:  11/22/17 220 lb (99.8 kg)  10/14/17 200 lb (90.7 kg)  08/15/17 216 lb 12.8 oz (98.3 kg)    Physical Exam  Constitutional: She is oriented to person, place, and time.  HENT:  Head: Normocephalic and atraumatic.  Mouth/Throat: Oropharynx is clear and moist.  Eyes: Pupils are equal, round, and reactive to light. EOM are normal.  Neck: Normal range of motion.  Cardiovascular: Normal rate, regular rhythm and normal heart sounds.  Pulmonary/Chest: Effort normal and breath sounds normal. Respiratory distress:    Abdominal: Soft. Bowel sounds are normal.  Musculoskeletal: Normal range of motion. She exhibits no edema, tenderness or deformity.  Lymphadenopathy:    She has no cervical adenopathy.  Neurological: She is alert and oriented to person, place, and time.  Skin:  Skin is warm and dry. No rash noted. No erythema.  Psychiatric: She has a normal mood and affect. Her behavior is normal. Judgment and thought content normal.  Vitals reviewed.    Lab Results  Component Value Date   WBC 8.9 11/22/2017   HGB 12.7 11/22/2017   HCT 39.1 11/22/2017   MCV 79.3 (L) 11/22/2017   PLT 560 (H) 11/22/2017     Chemistry      Component Value Date/Time   NA 140 08/30/2017 0812   K 4.0 08/30/2017 0812   CL 101 08/30/2017 0812   CO2 30 08/30/2017 0812   BUN 16 08/30/2017 0812   CREATININE 0.56 08/30/2017 0812   CREATININE 0.74 05/18/2015 1356      Component Value Date/Time   CALCIUM 8.9 08/30/2017 0812   ALKPHOS 56 08/30/2017 0812   AST 14 08/30/2017 0812   ALT 12 08/30/2017 0812   BILITOT 0.4 08/30/2017 0812       Impression and Plan: Karen Wall is a 62 year old white female.  She has thrombocytosis.  I will get her blood smear.  I will surprised that her platelet count is up a little bit more.  She does have iron deficiency anemia.  We will see about giving her some IV iron.  Hopefully, this will help.  I still cannot rule out a myeloproliferative disorder.  I would like to send off a JAK 2 assay on her.  I will also make sure we send off a Calreticulin assay.  I want to see her back in about 6 weeks.  We will give her a dose of IV iron.  I spent a good half hour with her.  She is very nice to talk to.  I just feel bad that she has this issue with her feet.   Karen Napoleon, MD 5/10/20192:50 PM

## 2017-11-25 ENCOUNTER — Ambulatory Visit: Payer: 59 | Admitting: Family Medicine

## 2017-11-25 ENCOUNTER — Telehealth: Payer: Self-pay | Admitting: *Deleted

## 2017-11-25 NOTE — Telephone Encounter (Signed)
Received request for Medical records from [facility], forwarded to [place] for email/scan/SLS 05/13

## 2017-11-26 ENCOUNTER — Encounter: Payer: Self-pay | Admitting: Podiatry

## 2017-11-26 ENCOUNTER — Telehealth: Payer: Self-pay | Admitting: *Deleted

## 2017-11-26 ENCOUNTER — Ambulatory Visit: Payer: 59 | Admitting: Podiatry

## 2017-11-26 DIAGNOSIS — M216X1 Other acquired deformities of right foot: Secondary | ICD-10-CM | POA: Diagnosis not present

## 2017-11-26 DIAGNOSIS — M21969 Unspecified acquired deformity of unspecified lower leg: Secondary | ICD-10-CM

## 2017-11-26 DIAGNOSIS — M216X2 Other acquired deformities of left foot: Secondary | ICD-10-CM | POA: Diagnosis not present

## 2017-11-26 DIAGNOSIS — M722 Plantar fascial fibromatosis: Secondary | ICD-10-CM | POA: Diagnosis not present

## 2017-11-26 NOTE — Progress Notes (Signed)
Subjective: 62 year old female presents for follow up on bilateral foot pain. Stated that physical therapy helped inside of the foot and the top of lesser toes are hurting.  Still not able to tolerate more than one hour on feet weight bearing.   Objective: Dermatologic: No abnormal findings. Vascular: Pedal pulses are all palpable. Orthopedic:  Improved plantar arch pain on left through physical therapy. Residual pain with weight bearing over the lesser MPJ area left. Pain at Posterior Tibialis tendon insertion site after prolonged weight bearing. Excess sagittal plane motion of the first ray with loading of forefoot bilateral. Increased STJ hyperpronation with weight bearing.  Neurologic: All epicritic and tactile sensations grossly intact.  Assessment: Lesser metatarsalgia left foot. Recurring pain at the Posterior tibialis tendon with increased weight bearing. Hypermobile first ray with STJ hyperpronation bilateral.  Treatment: Reviewed clinical findings and available surgical options. Continue with physical therapy. May use Ankle brace as needed. Continue to be out of work.  Increase weight bearing activity as tolerated. Disability form filled out duration of one year 06/27/17 through 06/27/18. Return in one month for re evaluation.

## 2017-11-26 NOTE — Patient Instructions (Signed)
Follow up on bilateral foot pain. Making some improvement with physical therapy. Still having pain at the fore foot and ankle area with weight bearing. Unable to stand on feet more than one hour now. Advised to stay in current level of activity. Return work day cannot be determined.

## 2017-11-26 NOTE — Telephone Encounter (Addendum)
Patient is aware of results. Appointment made.   ----- Message from [name], MD sent at 11/22/2017  3:04 PM EDT ----- Roselyn Reef :  Her iron is low.  We need to set her up with IV iron next 1-2 weeks.  pete

## 2017-11-29 ENCOUNTER — Other Ambulatory Visit: Payer: Self-pay

## 2017-11-29 ENCOUNTER — Inpatient Hospital Stay: Payer: 59

## 2017-11-29 ENCOUNTER — Telehealth: Payer: Self-pay | Admitting: Hematology & Oncology

## 2017-11-29 VITALS — BP 133/84 | HR 76 | Temp 98.3°F | Resp 16

## 2017-11-29 DIAGNOSIS — D509 Iron deficiency anemia, unspecified: Secondary | ICD-10-CM | POA: Diagnosis not present

## 2017-11-29 DIAGNOSIS — D508 Other iron deficiency anemias: Secondary | ICD-10-CM

## 2017-11-29 DIAGNOSIS — D473 Essential (hemorrhagic) thrombocythemia: Secondary | ICD-10-CM | POA: Diagnosis not present

## 2017-11-29 MED ORDER — SODIUM CHLORIDE 0.9 % IV SOLN
Freq: Once | INTRAVENOUS | Status: AC
  Administered 2017-11-29: 14:00:00 via INTRAVENOUS

## 2017-11-29 MED ORDER — SODIUM CHLORIDE 0.9 % IV SOLN
510.0000 mg | Freq: Once | INTRAVENOUS | Status: AC
  Administered 2017-11-29: 510 mg via INTRAVENOUS
  Filled 2017-11-29: qty 17

## 2017-11-29 NOTE — Telephone Encounter (Signed)
Faxed medical records to: [facility] P: [phone] F: [phone]  for   Karen Wall Sep 15, 1955 CASE: [id number]

## 2017-11-29 NOTE — Patient Instructions (Signed)

## 2017-12-02 MED FILL — LIOTHYRONINE SODIUM 5 MCG T: 5 | 45 days supply | Qty: 45 | Fill #1

## 2017-12-02 MED FILL — LEVOTHYROXINE 100 MCG TAB: 100 | 45 days supply | Qty: 45 | Fill #1

## 2017-12-12 ENCOUNTER — Encounter: Payer: Self-pay | Admitting: Internal Medicine

## 2017-12-12 ENCOUNTER — Encounter: Payer: Self-pay | Admitting: Family Medicine

## 2017-12-27 ENCOUNTER — Inpatient Hospital Stay: Payer: 59

## 2017-12-27 ENCOUNTER — Other Ambulatory Visit: Payer: Self-pay

## 2017-12-27 ENCOUNTER — Encounter: Payer: Self-pay | Admitting: Hematology & Oncology

## 2017-12-27 ENCOUNTER — Encounter: Payer: Self-pay | Admitting: *Deleted

## 2017-12-27 ENCOUNTER — Inpatient Hospital Stay: Payer: 59 | Attending: Hematology & Oncology | Admitting: Hematology & Oncology

## 2017-12-27 VITALS — BP 130/96 | HR 88 | Temp 98.2°F | Resp 18 | Wt 218.0 lb

## 2017-12-27 DIAGNOSIS — D473 Essential (hemorrhagic) thrombocythemia: Secondary | ICD-10-CM | POA: Diagnosis not present

## 2017-12-27 DIAGNOSIS — R7989 Other specified abnormal findings of blood chemistry: Secondary | ICD-10-CM | POA: Diagnosis not present

## 2017-12-27 DIAGNOSIS — K909 Intestinal malabsorption, unspecified: Secondary | ICD-10-CM | POA: Diagnosis not present

## 2017-12-27 DIAGNOSIS — D509 Iron deficiency anemia, unspecified: Secondary | ICD-10-CM | POA: Insufficient documentation

## 2017-12-27 DIAGNOSIS — D500 Iron deficiency anemia secondary to blood loss (chronic): Secondary | ICD-10-CM

## 2017-12-27 DIAGNOSIS — D75839 Thrombocytosis, unspecified: Secondary | ICD-10-CM

## 2017-12-27 LAB — CBC WITH DIFFERENTIAL (CANCER CENTER ONLY)
Basophils Absolute: 0 10*3/uL (ref 0.0–0.1)
Basophils Relative: 0 %
EOS ABS: 0.1 10*3/uL (ref 0.0–0.5)
EOS PCT: 1 %
HCT: 42.1 % (ref 34.8–46.6)
HEMOGLOBIN: 13.7 g/dL (ref 11.6–15.9)
LYMPHS PCT: 31 %
Lymphs Abs: 2.3 10*3/uL (ref 0.9–3.3)
MCH: 26.4 pg (ref 26.0–34.0)
MCHC: 32.5 g/dL (ref 32.0–36.0)
MCV: 81.1 fL (ref 81.0–101.0)
MONOS PCT: 10 %
Monocytes Absolute: 0.8 10*3/uL (ref 0.1–0.9)
NEUTROS PCT: 58 %
Neutro Abs: 4.3 10*3/uL (ref 1.5–6.5)
Platelet Count: 458 10*3/uL — ABNORMAL HIGH (ref 145–400)
RBC: 5.19 MIL/uL (ref 3.70–5.32)
RDW: 18.1 % — ABNORMAL HIGH (ref 11.1–15.7)
WBC Count: 7.6 10*3/uL (ref 3.9–10.0)

## 2017-12-27 LAB — IRON AND TIBC
IRON: 79 ug/dL (ref 41–142)
Saturation Ratios: 26 % (ref 21–57)
TIBC: 299 ug/dL (ref 236–444)
UIBC: 220 ug/dL

## 2017-12-27 LAB — TECHNOLOGIST SMEAR REVIEW

## 2017-12-27 LAB — FERRITIN: FERRITIN: 277 ng/mL — AB (ref 9–269)

## 2017-12-27 LAB — CMP (CANCER CENTER ONLY)
ALK PHOS: 64 U/L (ref 40–150)
ALT: 15 U/L (ref 0–55)
ANION GAP: 9 (ref 3–11)
AST: 18 U/L (ref 5–34)
Albumin: 3.6 g/dL (ref 3.5–5.0)
BUN: 12 mg/dL (ref 7–26)
CALCIUM: 9.6 mg/dL (ref 8.4–10.4)
CO2: 27 mmol/L (ref 22–29)
CREATININE: 0.67 mg/dL (ref 0.60–1.10)
Chloride: 103 mmol/L (ref 98–109)
GFR, Estimated: >60 mL/min (ref >60)
Glucose, Bld: 99 mg/dL (ref 70–140)
Potassium: 4.4 mmol/L (ref 3.5–5.1)
SODIUM: 139 mmol/L (ref 136–145)
TOTAL PROTEIN: 7.6 g/dL (ref 6.4–8.3)
Total Bilirubin: 0.3 mg/dL (ref 0.2–1.2)

## 2017-12-27 LAB — LACTATE DEHYDROGENASE: LDH: 173 U/L (ref 125–245)

## 2017-12-27 NOTE — Progress Notes (Signed)
Hematology and Oncology Follow Up Visit  Karen Wall [id number] [birth year] 62 y.o. 12/27/2017   Principle Diagnosis:   Thrombocytosis-iron deficiency  Current Therapy:    IV iron as indicated     Interim History:  Karen Wall is back for follow-up.  She is doing okay.  She has braces on her ankles bilaterally.  She has flat arches.  She wears the braces so that her ankles will not invert.  She is feeling pretty well.  She is working.  She is having no problems with nausea or vomiting.  She has had no headache.  She is had no cough or shortness of breath.  She is still working over at [employer].  We gave her iron last time that she was here.  Back in May, her ferritin was only 48 with iron saturation of 15%.e has had no headache.  Overall, her performance status is ECOG 1.  Medications:  Current Outpatient Medications:  .  acetaminophen (TYLENOL) 500 MG tablet, Take 500 mg by mouth every 6 (six) hours as needed for pain., Disp: , Rfl:  .  Cholecalciferol (VITAMIN D3) 5000 units CAPS, Take 1 capsule by mouth daily., Disp: , Rfl:  .  Cyanocobalamin (VITAMIN B-12 PO), Take by mouth., Disp: , Rfl:  .  IBUPROFEN PO, Take 200 mg by mouth as needed. , Disp: , Rfl:  .  levothyroxine (SYNTHROID, LEVOTHROID) 100 MCG tablet, TAKE 1 TABLET (100 MCG TOTAL) BY MOUTH DAILY., Disp: 45 tablet, Rfl: 1 .  liothyronine (CYTOMEL) 5 MCG tablet, TAKE 1 TABLET (5 MCG TOTAL) BY MOUTH DAILY., Disp: 45 tablet, Rfl: 1 .  LORazepam (ATIVAN) 1 MG tablet, Take 1/2 or 1 tablet at bedtime as needed for insomnia, Disp: 30 tablet, Rfl: 2 .  nitroGLYCERIN (NITRODUR - DOSED IN MG/24 HR) 0.2 mg/hr patch, Apply 1/4th patch to affected ankle, change daily, Disp: 30 patch, Rfl: 1  Allergies:  Allergies  Allergen Reactions  . Penicillins     unknown    Past Medical History, Surgical history, Social history, and Family History were reviewed and updated.  Review of Systems: Review of Systems    Constitutional: Negative.   HENT:  Negative.   Eyes: Negative.   Respiratory: Negative.   Cardiovascular: Negative.   Gastrointestinal: Negative.   Endocrine: Negative.   Genitourinary: Negative.    Musculoskeletal: Positive for arthralgias and myalgias.  Skin: Negative.   Neurological: Negative.   Hematological: Negative.   Psychiatric/Behavioral: Negative.     Physical Exam:  weight is 218 lb (98.9 kg). Her oral temperature is 98.2 F (36.8 C). Her blood pressure is 130/96 (abnormal) and her pulse is 88. Her respiration is 18 and oxygen saturation is 96%.   Wt Readings from Last 3 Encounters:  12/27/17 218 lb (98.9 kg)  11/22/17 220 lb (99.8 kg)  10/14/17 200 lb (90.7 kg)    Physical Exam  Constitutional: She is oriented to person, place, and time.  HENT:  Head: Normocephalic and atraumatic.  Mouth/Throat: Oropharynx is clear and moist.  Eyes: Pupils are equal, round, and reactive to light. EOM are normal.  Neck: Normal range of motion.  Cardiovascular: Normal rate, regular rhythm and normal heart sounds.  Pulmonary/Chest: Effort normal and breath sounds normal. Respiratory distress:    Abdominal: Soft. Bowel sounds are normal.  Musculoskeletal: Normal range of motion. She exhibits no edema, tenderness or deformity.  Lymphadenopathy:    She has no cervical adenopathy.  Neurological: She is alert and oriented to person,  place, and time.  Skin: Skin is warm and dry. No rash noted. No erythema.  Psychiatric: She has a normal mood and affect. Her behavior is normal. Judgment and thought content normal.  Vitals reviewed.    Lab Results  Component Value Date   WBC 7.6 12/27/2017   HGB 13.7 12/27/2017   HCT 42.1 12/27/2017   MCV 81.1 12/27/2017   PLT 458 (H) 12/27/2017     Chemistry      Component Value Date/Time   NA 140 08/30/2017 0812   K 4.0 08/30/2017 0812   CL 101 08/30/2017 0812   CO2 30 08/30/2017 0812   BUN 16 08/30/2017 0812   CREATININE 0.56  08/30/2017 0812   CREATININE 0.74 05/18/2015 1356      Component Value Date/Time   CALCIUM 8.9 08/30/2017 0812   ALKPHOS 56 08/30/2017 0812   AST 14 08/30/2017 0812   ALT 12 08/30/2017 0812   BILITOT 0.4 08/30/2017 0812       Impression and Plan: Karen Wall is a 62 year old white female.  She has thrombocytosis.  I will get her blood smear.  I will surprised that her platelet count is up a little bit more.  She does have iron deficiency anemia.  We will see about giving her some IV iron.  Hopefully, this will help.  If all looks good, we will get her back in 3 months.  I noted that her MCV is a little on the low side.  As such, her iron might be low despite her not being anemic.  [name], MD 6/14/201910:06 AM

## 2018-01-02 ENCOUNTER — Encounter: Payer: Self-pay | Admitting: Podiatry

## 2018-01-02 ENCOUNTER — Ambulatory Visit: Payer: 59 | Admitting: Podiatry

## 2018-01-02 DIAGNOSIS — M659 Synovitis and tenosynovitis, unspecified: Secondary | ICD-10-CM

## 2018-01-02 DIAGNOSIS — M216X1 Other acquired deformities of right foot: Secondary | ICD-10-CM | POA: Diagnosis not present

## 2018-01-02 DIAGNOSIS — M216X2 Other acquired deformities of left foot: Secondary | ICD-10-CM

## 2018-01-02 DIAGNOSIS — M21969 Unspecified acquired deformity of unspecified lower leg: Secondary | ICD-10-CM | POA: Diagnosis not present

## 2018-01-02 DIAGNOSIS — M722 Plantar fascial fibromatosis: Secondary | ICD-10-CM

## 2018-01-02 NOTE — Progress Notes (Signed)
Doing better as long as limits her weight bearing activity. Subjective: 62 year old female presents for follow up on bilateral foot pain. Patient has completed her physical therapy. She is able to do her minimum weight bearing actitivy as long as she is not on her feet much, which is under one hour weight bearing activity. Has to be on her Ankle brace to ambulate with ease.  Stated that there is no change in her condition sine her last visit in Dec 03, 2017.  Objective: Dermatologic:No abnormal findings. Vascular:Pedal pulses are all palpable. Orthopedic: Improved plantar arch pain on left through physical therapy. Residual pain with weight bearing over the lesser MPJ area left. Pain at Posterior Tibialis tendon insertion site after prolonged weight bearing. Excess sagittal plane motion of the first ray with loading of forefoot bilateral. Increased STJ hyperpronation with weight bearing. Neurologic:All epicritic and tactile sensations grossly intact.  Assessment: Lesser metatarsalgia left foot. Recurring pain at the Posterior tibialis tendon with increased weight bearing. Hypermobile first ray with STJ hyperpronation bilateral.  Treatment: Reviewed clinical findings and available surgical options. Continue with modified weight bearing activities and exercise such as stationary bike, water exercise, etc. May use Ankle brace as needed. Increase weight bearing activity as tolerated. Will re evaluate in 2 months.

## 2018-01-02 NOTE — Patient Instructions (Signed)
Follow up on bilateral foot pain R>L. No change in condition with pain upon prolonged weight bearing. Continue with limited weight bearing and may increase none weight bearing exercise as tolerated. Return in 2 month for re-evaluation.

## 2018-01-06 ENCOUNTER — Telehealth: Payer: Self-pay | Admitting: *Deleted

## 2018-01-06 NOTE — Telephone Encounter (Signed)
Received request for Medical records from [facility] [employer], forwarded to [place] for email/scan/SLS 06/24   

## 2018-01-14 ENCOUNTER — Encounter: Payer: Self-pay | Admitting: Internal Medicine

## 2018-01-14 ENCOUNTER — Ambulatory Visit: Payer: 59 | Admitting: Internal Medicine

## 2018-01-14 VITALS — BP 138/90 | HR 86 | Ht 62.0 in | Wt 222.2 lb

## 2018-01-14 DIAGNOSIS — E559 Vitamin D deficiency, unspecified: Secondary | ICD-10-CM | POA: Diagnosis not present

## 2018-01-14 DIAGNOSIS — E538 Deficiency of other specified B group vitamins: Secondary | ICD-10-CM

## 2018-01-14 DIAGNOSIS — E890 Postprocedural hypothyroidism: Secondary | ICD-10-CM | POA: Diagnosis not present

## 2018-01-14 DIAGNOSIS — C73 Malignant neoplasm of thyroid gland: Secondary | ICD-10-CM

## 2018-01-14 DIAGNOSIS — R7989 Other specified abnormal findings of blood chemistry: Secondary | ICD-10-CM

## 2018-01-14 LAB — VITAMIN D 25 HYDROXY (VIT D DEFICIENCY, FRACTURES): VITD: 31.42 ng/mL (ref 30.00–100.00)

## 2018-01-14 LAB — T4, FREE: FREE T4: 1.18 ng/dL (ref 0.60–1.60)

## 2018-01-14 LAB — TSH: TSH: 0.06 u[IU]/mL — AB (ref 0.35–4.50)

## 2018-01-14 LAB — T3, FREE: T3 FREE: 3.6 pg/mL (ref 2.3–4.2)

## 2018-01-14 LAB — VITAMIN B12: VITAMIN B 12: 396 pg/mL (ref 211–911)

## 2018-01-14 MED ORDER — THYROID 60 MG PO TABS: 60.0000 mg | ORAL_TABLET | Freq: Every day | ORAL | 3 refills | Status: DC

## 2018-01-14 NOTE — Patient Instructions (Signed)
Please change to: - Armour 60 mg alternating with 90 mg (1.5 tablets)  If rash appears, let me know and in that case, use: - Levothyroxine 88 mcg + Liothyronine 7.5 mcg daily  Please stop at the lab.  Please come back for a follow-up appointment in 6 months.

## 2018-01-14 NOTE — Progress Notes (Signed)
Patient ID: ICIS Karen Wall, female   DOB: 1955/10/12, 62 y.o.   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 Last visit 6 mo ago.  Reviewed and addended sx In 03/2013, she developed fevers >> TSH checked >> hyperthyroidism (TMNG) >> started MMI >> continued for 1 month.   She also had a thyroid U/S in 03/2013 >> 2 thyroid nodules >> FNA of each in 04/2013 was negative. However, she opted for thyroidectomy for the TMNG.  She had total thyroidectomy 06/2013>> one focus of follicular variant of PTC of 0.8 cm : Specimen: Thyroid Procedure: Thyroidectomy Specimen Integrity (intact/fragmented): Intact Tumor focality: Unifocal Dominant tumor: Right lobe Maximum tumor size (cm): 0.8 cm Tumor laterality: Right Histologic type (including subtype and/or unique features as applicable): Follicular variant papillary thyroid carcinoma Tumor capsule: N/A Extrathyroidal extension: No Margins: Free of tumor Lymph - Vascular invasion: No Capsular invasion with degree of invasion if present: N/A Lymph nodes: # examined 0; # positive; N/A TNM code: pT1a, pNX Non-neoplastic thyroid: Multinodular goiter. Comments: There is a 0.8 cm nodule of papillary thyroid carcinoma, follicular variant in the right superior lobe which does not involve the margin and is confined within the right lobe. The remainder of the thyroid shows features of multinodular goiter.  Dr Loanne Drilling advised against RAI Tx and against following Thyroglobulin. She then sought a second opinion with me >> I I recommended the same.  Neck U/S (04/02/2014): No evidence of abnormal soft tissue in the surgical thyroid bed. Nonenlarged cervical lymph nodes identified.  Neck U/S (03/29/2016):  normal post thyroidectomy appearance.  No suspicious lymphadenopathy  Post-surgical hypothyroidism:  Reviewed  and addended hx: Started on 100 mcg Synthroid  - brand name after sx >> TSH 0.074 >> dose decrease to 50 mcg >> pt felt terrible >> TSH 14.6 >> dose increased to 75 mcg, but TSH returned above goal, at 2.76 >> we increased to 88 mcg daily. However, We added T3 to help with fatigue (Armour) 02/2017 >> then 60 and 90 mg every other day.   However, after the increase in dose >> rash, palpitations, panic attacks, SOB but feeling more energetic and less depressed >> I suggested to change to Naturethroid 81.25 mg. This was on back order >> now Levothyroxine 100 mcg + Liothyronine 5 mcg daily.  She does not feel too well on this combination.  She is still very tired, crushing around 3 PM, and feels that she gained weight.  Pt  takes the thyroid hormones: - in am - fasting - at least 30 min from b'fast - no Ca (only occas. Tums), Fe, MVI, PPIs - not on Biotin  TFTs reviewed >> normal at last check: Lab Results  Component Value Date   TSH 0.45 09/03/2017   TSH 3.76 07/17/2017   TSH 3.42 06/03/2017   TSH 9.64 (H) 04/24/2017   TSH 3.23 03/15/2017   TSH 1.89 03/15/2016   TSH 1.57 03/07/2015   TSH 1.12 08/24/2014   TSH 1.64 06/03/2014   TSH 2.76 01/19/2014   FREET4 0.97 09/03/2017   FREET4 0.58 (L) 07/17/2017   FREET4 0.52 (L) 06/03/2017   FREET4 0.52 (L) 04/24/2017   FREET4 0.78 03/15/2017   FREET4 1.07 03/15/2016   FREET4 0.96 03/07/2015   FREET4 1.14 08/24/2014   FREET4 1.08 06/03/2014   FREET4 0.86 01/19/2014   Pt denies: - feeling nodules  in neck - hoarseness - dysphagia - choking - SOB with lying down  She had low vitamin D before, normalized on vit D supplement: Lab Results  Component Value Date   VD25OH 30.59 06/03/2017   VD25OH 28.16 (L) 04/24/2017   VD25OH 28.43 (L) 03/15/2017   VD25OH 28.02 (L) 05/21/2016   VD25OH 19.16 (L) 03/15/2016   VD25OH 25 (L) 05/18/2015   VD25OH 18.97 (L) 08/24/2014  She was on Ergocalciferol 50,000 IU once a week, now OTC vitamin D 5000  units daily.  She also had a low vitamin B12 In the past, latest level normal: Lab Results  Component Value Date   VITAMINB12 530 03/15/2017   VITAMINB12 1,228 (H) 05/18/2015   VITAMINB12 237 03/07/2015   VITAMINB12 252 08/24/2014   On 1000 mcg B12 sublingual q 3 days.  ROS: Constitutional: + weight gain/no weight loss, + fatigue, no subjective hyperthermia, no subjective hypothermia Eyes: no blurry vision, no xerophthalmia ENT: no sore throat, + see HPI Cardiovascular: no CP/+ SOB/no palpitations/no leg swelling Respiratory: no cough/+ SOB/no wheezing Gastrointestinal: no N/no V/no D/no C/no acid reflux Musculoskeletal: no muscle aches/+ joint aches (bilateral ankle pain and arch collapse, for which she was taken out of work) Skin: no rashes, no hair loss Neurological: no tremors/no numbness/no tingling/no dizziness  I reviewed pt's medications, allergies, PMH, social hx, family hx, and changes were documented in the history of present illness. Otherwise, unchanged from my initial visit note.  Past Medical History:  Diagnosis Date  . GERD (gastroesophageal reflux disease)   . H/O hiatal hernia    "very small "  . Hyperlipidemia   . 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 Numbness    Left side of face around mouth - unknown etiology  . Palpitations    Occurred during hyperthyroid  . Thrombocytosis ([place]) 11/22/2017  . Thyroid cancer ([place])   . Thyroid disease    hyperthyroidism   Past Surgical History:  Procedure Laterality Date  . BREAST BIOPSY    . CHOLECYSTECTOMY  1992  . THYROIDECTOMY N/A 07/03/2013   Procedure: TOTAL THYROIDECTOMY;  Surgeon: Earnstine Regal, MD;  Location: [facility];  Service: General;  Laterality: N/A;   Social History   Socioeconomic History  . Marital status: Divorced    Spouse name: Not on file  . Number of children: 1  . Years of education: Not on file  . Highest education level: Not on file  Occupational History  . Occupation: Therapist, sports  Social Needs  .  Financial resource strain: Not on file  . Food insecurity:    Worry: Not on file    Inability: Not on file  . Transportation needs:    Medical: Not on file    Non-medical: Not on file  Tobacco Use  . Smoking status: Never Smoker  . Smokeless tobacco: Never Used  Substance and Sexual Activity  . Alcohol use: No    Alcohol/week: 0.0 oz  . Drug use: No  . Sexual activity: Never  Lifestyle  . Physical activity:    Days per week: Not on file    Minutes per session: Not on file  . Stress: Not on file  Relationships  . Social connections:    Talks on phone: Not on file    Gets together: Not on file    Attends religious service: Not on file    Active member of club or organization: Not on file    Attends meetings of clubs or organizations: Not on file  Relationship status: Not on file  . Intimate partner violence:    Fear of current or ex partner: Not on file    Emotionally abused: Not on file    Physically abused: Not on file    Forced sexual activity: Not on file  Other Topics Concern  . Not on file  Social History Narrative   Divorced. Education: [employer]. Exercise: Yes.    Current Outpatient Medications on File Prior to Visit  Medication Sig Dispense Refill  . acetaminophen (TYLENOL) 500 MG tablet Take 500 mg by mouth every 6 (six) hours as needed for pain.    . Cholecalciferol (VITAMIN D3) 5000 units CAPS Take 1 capsule by mouth daily.    . Cyanocobalamin (VITAMIN B-12 PO) Take by mouth.    . IBUPROFEN PO Take 200 mg by mouth as needed.     Marland Kitchen levothyroxine (SYNTHROID, LEVOTHROID) 100 MCG tablet TAKE 1 TABLET (100 MCG TOTAL) BY MOUTH DAILY. 45 tablet 1  . liothyronine (CYTOMEL) 5 MCG tablet TAKE 1 TABLET (5 MCG TOTAL) BY MOUTH DAILY. 45 tablet 1  . LORazepam (ATIVAN) 1 MG tablet Take 1/2 or 1 tablet at bedtime as needed for insomnia 30 tablet 2  . nitroGLYCERIN (NITRODUR - DOSED IN MG/24 HR) 0.2 mg/hr patch Apply 1/4th patch to affected ankle, change daily 30 patch 1    No current facility-administered medications on file prior to visit.    Allergies  Allergen Reactions  . Penicillins     unknown   Family History  Problem Relation Age of Onset  . Hypertension Mother   . Diabetes Mother   . Hyperlipidemia Mother   . Mental illness Father   . CAD Father        MI in his 67s  . Diabetes Father   . Hyperlipidemia Father   . Hypertension Father   . Mental illness Maternal Grandmother   . Stroke Maternal Grandfather    PE: BP 138/90   Pulse 86   Ht '5\' 2"'$  (1.575 m)   Wt 222 lb 3.2 oz (100.8 kg)   SpO2 97%   BMI 40.64 kg/m  Body mass index is 40.64 kg/m. Wt Readings from Last 3 Encounters:  01/14/18 222 lb 3.2 oz (100.8 kg)  12/27/17 218 lb (98.9 kg)  11/22/17 220 lb (99.8 kg)   Constitutional: overweight, in NAD Eyes: PERRLA, EOMI, no exophthalmos ENT: moist mucous membranes, no neck masses palpated, thyroidectomy scar healed, no cervical lymphadenopathy Cardiovascular: RRR, No MRG Respiratory: CTA B Gastrointestinal: abdomen soft, NT, ND, BS+ Musculoskeletal: no deformities, strength intact in all 4, + bilateral ankle support Skin: moist, warm, no rashes Neurological: no tremor with outstretched hands, DTR normal in all 4  ASSESSMENT: 1. Thyroid cancer - Papillary Thy Ca, follicular variant  - subcm, w/o extension to capsule, lymph or blood vessel, and without neck extension - s/p total thyroidectomy in 06/2013  2. Postsurgical hypothyroidism  3. Vitamin D deficiency  4. Low vitamin B12   PLAN: 1. PTC - Patient has a history of follicular variant of PTC, which is the least aggressive PTC type.  She is stage I TNM. - No neck compression symptoms no neck masses palpated - Reviewed the latest thyroid ultrasound from 2017: No cancer recurrence  - Plan to repeat another thyroid ultrasound in 2020  2. Post-surgical hypothyroidism - We previously increased the Armour dose from 60 mg daily to 60 alternating with 90 mg every  other day.  She described more anxiety, palpitations, and the  rash on her chest so I suggested to switch to Nature-Throid which is pure.  This was on back order so we had to switch to levothyroxine and liothyronine - she continues on levothyroxine 100 mcg daily and liothyronine 5 mcg daily - pt still feels tired and feels like she gained weight on this visit. "I do not feel like myself".  She mentions that she felt the best on Armour, however, unfortunately, she could not tolerate this due to rash.  She feels that the rash started after starting alternating 60 with 90 mg every other day.  We discussed that the 90 mg tablet could have been the problem.  She agrees to retry the Armour 60 mg: 1 tablet alternating with 1.5 tablets every other day.  If she does get the rash from this, we will switch to levothyroxine and liothyronine, but I will increase her liothyronine dose slightly.  We discussed  about a regimen containing LT4 88 mcg + LT3 7.5 mcg daily. - we discussed about taking the thyroid hormone every day, with water, >30 minutes before breakfast, separated by >4 hours from acid reflux medications, calcium, iron, multivitamins. Pt. is taking it correctly. - latest thyroid labs reviewed with pt >> normal  - will check thyroid tests today: TSH, free T3 and fT4 - If labs are abnormal, she will need to return for repeat TFTs in 1.5 months  3. And 4. Vitamin D deficiency and low vitamin B12 - Reviewed latest B12 and vitamin D levels and they were normal.   - Continue supplementation with thousand micrograms vitamin B12 every 3 days and 5000 units vitamin D daily - Check levels today  Component     Latest Ref Rng & Units 01/14/2018  TSH     0.35 - 4.50 uIU/mL 0.06 (L)  T4,Free(Direct)     0.60 - 1.60 ng/dL 1.18  Triiodothyronine,Free,Serum     2.3 - 4.2 pg/mL 3.6  VITD     30.00 - 100.00 ng/mL 31.42  Vitamin B12     211 - 911 pg/mL 396   Vitamin D and B12 are normal >> continue current  supplementation. TSH is too low >> will suggest to go to Armour 60 again and recheck in 1.5 mo.  Philemon Kingdom, MD PhD [facility] Endocrinology

## 2018-01-15 ENCOUNTER — Encounter: Payer: Self-pay | Admitting: Internal Medicine

## 2018-01-15 MED ORDER — THYROID 60 MG PO TABS: 60.0000 mg | ORAL_TABLET | Freq: Every day | ORAL | 3 refills | Status: DC

## 2018-01-15 MED FILL — ARMOUR THYROID 60 MG TABLET: 60 | 90 days supply | Qty: 90 | Fill #0

## 2018-02-05 ENCOUNTER — Telehealth: Payer: Self-pay | Admitting: Hematology & Oncology

## 2018-02-05 NOTE — Telephone Encounter (Signed)
Faxed medical records to: [facility] P: [phone] F: [phone]  for   Karen Wall [birth year] CASE: [id number]   SECOND ATTEMPT     COPY SCANNED

## 2018-02-22 NOTE — Progress Notes (Signed)
[place] at [facility] [address], [place], [place] [address] [phone]  Date:  02/24/2018   Name:  Karen Wall   DOB:  [birth year]   MRN:  [id number]  PCP:  Darreld Mclean, MD    Chief Complaint: Anemia ( month follow up appointment); Rash; Hyperlipidemia; and Numbness (left side of face, saw neurology, off and on, would like neck xray)   History of Present Illness:  Karen Wall is a 62 y.o. very pleasant female patient who presents with the following:  Periodic follow-up today History of def anemia, vitamin D and B12 deficiency, post- surgical hypothyroidism and thyroid cancer, hyperlipidemia  She sees Dr. Marin Olp for her history of thyroid cancer and thrombocytosis/ iron def She sees Benjiman Core for her thyroid management otherwise  I last saw her in January of this year: She is out on STD for her feet issues right now- she is under the care of a podiatrist, Dr. Caffie Pinto She is wearing sweed-o ankle braces inside her shoes She has pain with walking and is not able to work right now Dr. Caffie Pinto has presented a treatment plan that would involve a few different operations to fix her issues and hopefully get her back to better function.  [name] loves and trusts Dr. Caffie Pinto but is still not sure what she should do. She would like to perhaps get a second opinion prior to embarking on a surgical plan.  Offered to have her see Dr. Doran Durand and she would like to do this  Last pap in 2015, never had an abnl, she is not SA Her son is a Ph-D Ship broker at [employer] in [place]. He is dating someone seriously and she is hoping they will get married  Her mood is good except she is frustrated that she is more sedentary recently due to her foot issues   She is using arch supports and also wearing braces to prevent her ankles from inverting. 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 due to her foot and ankle  issues  She has had numbness of her face for a long time, did see neurology about this years ago She would like to have x-rays of her neck to see if this shows [place] anything- we can certainly do this for her today  She did have films of her lower back and knees per a [facility] MD within the last month or so; she asked for these records to be sent to me but I have not gotten them yet   Her son is doing great, he was in town recently to do demos for the large telescope at [facility] She is taking vit D 5k daily  Her thyroid was changed recently to armour which helps some with her mood She admits that her mood/ depression is getting to be a problem.   She has a hard time walking and going upstairs, so she cannot do a lot of things that she  She is having a hard time sleeping She does have low energy but this is not new- since her thyroid was removed   She does have some anxiety, but this is not her main feature She does use lorazepam on rare occasion for sleep-  She would like to go on medication for depression No SI Never had seizure  Patient Active Problem List   Diagnosis Date Noted  . 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 Thrombocytosis (  [place]) 11/22/2017  . Bilateral ankle pain 10/15/2017  . IDA (iron deficiency anemia) 07/23/2016  . Vitamin D deficiency 03/15/2016  . Low vitamin B12 level 03/15/2016  . Dyspnea 05/28/2014  . Chest tightness 05/28/2014  . 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 GERD (gastroesophageal reflux disease)   . H/O hiatal hernia    "very small "  . Hyperlipidemia   . 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 Numbness    Left side of face around mouth - unknown etiology  . Palpitations    Occurred during hyperthyroid  . Thrombocytosis ([place]) 11/22/2017  . Thyroid cancer ([place])   . Thyroid disease    hyperthyroidism    Past Surgical History:  Procedure  Laterality Date  . BREAST BIOPSY    . CHOLECYSTECTOMY  1992  . THYROIDECTOMY N/A 07/03/2013   Procedure: TOTAL THYROIDECTOMY;  Surgeon: Earnstine Regal, MD;  Location: [facility];  Service: General;  Laterality: N/A;    Social History   Tobacco Use  . Smoking status: Never Smoker  . Smokeless tobacco: Never Used  Substance Use Topics  . Alcohol use: No    Alcohol/week: 0.0 standard drinks  . Drug use: No    Family History  Problem Relation Age of Onset  . Hypertension Mother   . Diabetes Mother   . Hyperlipidemia Mother   . Mental illness Father   . CAD Father        MI in his 29s  . Diabetes Father   . Hyperlipidemia Father   . Hypertension Father   . Mental illness Maternal Grandmother   . Stroke Maternal Grandfather     Allergies  Allergen Reactions  . Penicillins     unknown    Medication list has been reviewed and updated.  Current Outpatient Medications on File Prior to Visit  Medication Sig Dispense Refill  . acetaminophen (TYLENOL) 500 MG tablet Take 500 mg by mouth every 6 (six) hours as needed for pain.    . Cholecalciferol (VITAMIN D3) 5000 units CAPS Take 1 capsule by mouth daily.    . Cyanocobalamin (VITAMIN B-12 PO) Take by mouth.    . IBUPROFEN PO Take 200 mg by mouth as needed.     . nitroGLYCERIN (NITRODUR - DOSED IN MG/24 HR) 0.2 mg/hr patch Apply 1/4th patch to affected ankle, change daily 30 patch 1  . thyroid (ARMOUR THYROID) 60 MG tablet Take 1 tablet (60 mg total) by mouth daily before breakfast. 90 tablet 3   No current facility-administered medications on file prior to visit.     Review of Systems:  As per HPI- otherwise negative. No fever or chills No CP or SOB   Physical Examination: Vitals:   02/24/18 0939  BP: 130/80  Pulse: 81  Temp: 98 F (36.7 C)  SpO2: 95%   Vitals:   02/24/18 0939  Weight: 224 lb (101.6 kg)  Height: 5\' 2"  (1.575 m)   Body mass index is 40.97 kg/m. Ideal Body Weight: Weight in (lb) to have BMI =  25: 136.4  GEN: WDWN, NAD, Non-toxic, A & O x 3, obese, otherwise looks well  HEENT: Atraumatic, Normocephalic. Neck supple. No masses, No LAD.  Bilateral TM wnl, oropharynx normal.  PEERL,EOMI.   Ears and Nose: No external deformity. CV: RRR, No M/G/R. No JVD. No thrill. No extra heart sounds. PULM: CTA B, no wheezes, crackles, rhonchi. No retractions.  No resp. distress. No accessory muscle use. EXTR: No c/c/e NEURO Normal gait.  Wearing AFO type braces on her bilateral ankles today PSYCH: Normally interactive. Conversant. Not depressed or anxious appearing.  Calm demeanor.    Assessment and Plan: Reactive depression - Plan: buPROPion (WELLBUTRIN SR) 150 MG 12 hr tablet  Primary insomnia - Plan: LORazepam (ATIVAN) 1 MG tablet  Poor sleep - Plan: LORazepam (ATIVAN) 1 MG tablet  Left facial pain - Plan: DG Cervical Spine Complete  Following up today Would like to check plain films of her neck which is fine-  Dg Cervical Spine Complete  Result Date: 02/24/2018 CLINICAL DATA:  Numbness of the left face. EXAM: CERVICAL SPINE - COMPLETE 4+ VIEW COMPARISON:  None. FINDINGS: There is no evidence of cervical spine fracture or prevertebral soft tissue swelling. Alignment is normal. No other significant bone abnormalities are identified. This disc spaces are relatively well maintained. Bilateral neural foramina are patent. IMPRESSION: Negative cervical spine radiographs. Electronically Signed   By: Kathreen Devoid   On: 02/24/2018 10:48   Message to pt regarding her neck films Will start on wellbutrin  Discussed getting a mobility scooter so she is able to go on longer outings.  Right now her friends will leave her behind if they are doing something more active.  She will think about this Refilled her lorazepam See patient instructions for more details.     Signed Lamar Blinks, MD

## 2018-02-24 ENCOUNTER — Ambulatory Visit: Payer: 59 | Admitting: Family Medicine

## 2018-02-24 ENCOUNTER — Encounter: Payer: Self-pay | Admitting: Family Medicine

## 2018-02-24 ENCOUNTER — Ambulatory Visit (HOSPITAL_BASED_OUTPATIENT_CLINIC_OR_DEPARTMENT_OTHER)
Admission: RE | Admit: 2018-02-24 | Discharge: 2018-02-24 | Disposition: A | Discharge status: Home / Self Care | Payer: 59 | Source: Ambulatory Visit | Attending: Family Medicine | Admitting: Family Medicine

## 2018-02-24 VITALS — BP 130/80 | HR 81 | Temp 98.0°F | Ht 62.0 in | Wt 224.0 lb

## 2018-02-24 DIAGNOSIS — R51 Headache: Secondary | ICD-10-CM

## 2018-02-24 DIAGNOSIS — F5101 Primary insomnia: Secondary | ICD-10-CM

## 2018-02-24 DIAGNOSIS — R519 Headache, unspecified: Secondary | ICD-10-CM

## 2018-02-24 DIAGNOSIS — F329 Major depressive disorder, single episode, unspecified: Secondary | ICD-10-CM | POA: Insufficient documentation

## 2018-02-24 DIAGNOSIS — Z72820 Sleep deprivation: Secondary | ICD-10-CM | POA: Diagnosis not present

## 2018-02-24 DIAGNOSIS — R221 Localized swelling, mass and lump, neck: Secondary | ICD-10-CM | POA: Diagnosis not present

## 2018-02-24 MED ORDER — LORAZEPAM 1 MG PO TABS: ORAL_TABLET | ORAL | 1 refills | Status: DC

## 2018-02-24 MED ORDER — BUPROPION HCL ER (SR) 150 MG PO TB12: 150.0000 mg | ORAL_TABLET | Freq: Two times a day (BID) | ORAL | 6 refills | Status: DC

## 2018-02-24 MED FILL — BUPROPION HCL SR 150 MG TAB: 150 | 30 days supply | Qty: 60 | Fill #0

## 2018-02-24 MED FILL — LORazepam 1 MG TABS: 1 | 30 days supply | Qty: 30 | Fill #0

## 2018-02-24 NOTE — Patient Instructions (Signed)
It was very nice to see you today- I am sorry that you are having so many problems with your feet and ankles Please do look into getting a little mobility scooter so you are able to go out and do more Let's try some wellbutrin for your mood- take it once a day for 3 days, then go to twice a day If this worsens your anxiety or does not help please let me know I also refilled your ativan that you use on occasion for sleep  Please send me a mychart message in about one month with an update as to your mood and energy level- sooner if not doing ok! Otherwise we can plan to visit in 6 months

## 2018-03-04 ENCOUNTER — Telehealth: Payer: Self-pay | Admitting: Family Medicine

## 2018-03-04 ENCOUNTER — Ambulatory Visit: Payer: 59 | Admitting: Podiatry

## 2018-03-04 ENCOUNTER — Encounter: Payer: Self-pay | Admitting: Family Medicine

## 2018-03-04 ENCOUNTER — Encounter: Payer: Self-pay | Admitting: Internal Medicine

## 2018-03-04 ENCOUNTER — Encounter: Payer: Self-pay | Admitting: Podiatry

## 2018-03-04 DIAGNOSIS — M659 Synovitis and tenosynovitis, unspecified: Secondary | ICD-10-CM | POA: Diagnosis not present

## 2018-03-04 DIAGNOSIS — M216X1 Other acquired deformities of right foot: Secondary | ICD-10-CM | POA: Diagnosis not present

## 2018-03-04 DIAGNOSIS — M21969 Unspecified acquired deformity of unspecified lower leg: Secondary | ICD-10-CM

## 2018-03-04 DIAGNOSIS — R200 Anesthesia of skin: Secondary | ICD-10-CM

## 2018-03-04 DIAGNOSIS — M722 Plantar fascial fibromatosis: Secondary | ICD-10-CM | POA: Diagnosis not present

## 2018-03-04 DIAGNOSIS — M216X2 Other acquired deformities of left foot: Secondary | ICD-10-CM | POA: Diagnosis not present

## 2018-03-04 DIAGNOSIS — R202 Paresthesia of skin: Principal | ICD-10-CM

## 2018-03-04 NOTE — Telephone Encounter (Signed)
Copied from [place] [phone]. Topic: Quick Communication - See Telephone Encounter >> Mar 04, 2018  4:46 PM Vernona Rieger wrote: CRM for notification. See Telephone encounter for: 03/04/18.  Patient called and said she has orders for lab work from Dr Cruzita Lederer, she said that Dr Cruzita Lederer told her that she could get it at the office. Patient would like a call back in regards to this? [phone]

## 2018-03-04 NOTE — Patient Instructions (Signed)
2 month follow up on bilateral foot pain. Can use better supported shoe gear.  Continue with modified weight bearing activities and exercise such as stationary bike, water exercise, etc. Continue with Ankle brace as needed. Increase weight bearing activity as tolerated. Will re evaluate in 2 months.

## 2018-03-04 NOTE — Progress Notes (Signed)
Subjective: 62 year old female presents for follow up on bilateral foot pain. Stated that her condition is good as long as she does not exceed her physical activity beyond her limit. There is no change in her condition since her last visit, 01/02/18.  HPI: Patient has completed her physical therapy. Wearing orthotics and Ankle brace to ambulate.   Objective: Dermatologic:No abnormal findings. Vascular:Pedal pulses are all palpable. Orthopedic: Improved plantar arch pain on left through physical therapy. Pain at the lesser MPJ area left foot with increased weight bearing. Residual pain with weight bearing over the lesser MPJ area left. Pain at Posterior Tibialis tendon insertion site after prolonged weight bearing. Excess sagittal plane motion of the first ray with loading of forefoot bilateral. Increased STJ hyperpronation with weight bearing. Neurologic:All epicritic and tactile sensations grossly intact.  Assessment: Lesser metatarsalgia left foot with increased weight bearing. Recurring pain at the Posterior tibialis tendon with increased weight bearing. Hypermobile first ray with STJ hyperpronation bilateral.  Treatment: Reviewed benefit of proper shoe gear with existing orthotics. Continue with modified weight bearing activities and exercise such as stationary bike, water exercise, etc. Continue with Ankle brace as needed. Increase weight bearing activity as tolerated. Will re evaluate in 2 months.

## 2018-03-05 NOTE — Telephone Encounter (Signed)
Spoke with Dr. Lorelei Pont, she states it is okay for patient to have labs drawn at this location. Patient has been scheduled for lab visit on Monday 8/26

## 2018-03-05 NOTE — Addendum Note (Signed)
Addended by: Lamar Blinks C on: 03/05/2018 09:46 AM   Modules accepted: Orders

## 2018-03-10 ENCOUNTER — Other Ambulatory Visit: Payer: Self-pay | Admitting: Internal Medicine

## 2018-03-10 ENCOUNTER — Encounter: Payer: Self-pay | Admitting: Internal Medicine

## 2018-03-10 ENCOUNTER — Other Ambulatory Visit (INDEPENDENT_AMBULATORY_CARE_PROVIDER_SITE_OTHER): Payer: 59

## 2018-03-10 DIAGNOSIS — E890 Postprocedural hypothyroidism: Secondary | ICD-10-CM

## 2018-03-10 LAB — TSH: TSH: 9.69 u[IU]/mL — AB (ref 0.35–4.50)

## 2018-03-10 LAB — T3, FREE: T3, Free: 4.1 pg/mL (ref 2.3–4.2)

## 2018-03-10 LAB — T4, FREE: Free T4: 0.44 ng/dL — ABNORMAL LOW (ref 0.60–1.60)

## 2018-03-10 MED ORDER — THYROID 60 MG PO TABS: ORAL_TABLET | ORAL | 3 refills | Status: DC

## 2018-03-11 ENCOUNTER — Other Ambulatory Visit: Payer: 59

## 2018-03-26 ENCOUNTER — Encounter: Payer: Self-pay | Admitting: Internal Medicine

## 2018-03-27 ENCOUNTER — Telehealth: Payer: Self-pay

## 2018-03-27 NOTE — Telephone Encounter (Signed)
Mail pts disability paperwork to [employer].

## 2018-04-01 ENCOUNTER — Inpatient Hospital Stay: Payer: 59

## 2018-04-01 ENCOUNTER — Inpatient Hospital Stay: Payer: 59 | Attending: Hematology & Oncology | Admitting: Hematology & Oncology

## 2018-04-01 ENCOUNTER — Other Ambulatory Visit: Payer: Self-pay

## 2018-04-01 ENCOUNTER — Encounter: Payer: Self-pay | Admitting: Hematology & Oncology

## 2018-04-01 VITALS — BP 143/97 | HR 76 | Temp 98.3°F | Resp 20 | Wt 226.8 lb

## 2018-04-01 DIAGNOSIS — D473 Essential (hemorrhagic) thrombocythemia: Secondary | ICD-10-CM

## 2018-04-01 DIAGNOSIS — D75839 Thrombocytosis, unspecified: Secondary | ICD-10-CM

## 2018-04-01 DIAGNOSIS — I10 Essential (primary) hypertension: Secondary | ICD-10-CM | POA: Diagnosis not present

## 2018-04-01 DIAGNOSIS — D696 Thrombocytopenia, unspecified: Secondary | ICD-10-CM | POA: Diagnosis not present

## 2018-04-01 DIAGNOSIS — K909 Intestinal malabsorption, unspecified: Secondary | ICD-10-CM

## 2018-04-01 DIAGNOSIS — D509 Iron deficiency anemia, unspecified: Secondary | ICD-10-CM | POA: Diagnosis not present

## 2018-04-01 LAB — IRON AND TIBC
IRON: 52 ug/dL (ref 41–142)
Saturation Ratios: 17 % — ABNORMAL LOW (ref 21–57)
TIBC: 310 ug/dL (ref 236–444)
UIBC: 258 ug/dL

## 2018-04-01 LAB — CMP (CANCER CENTER ONLY)
ALBUMIN: 3.3 g/dL — AB (ref 3.5–5.0)
ALK PHOS: 65 U/L (ref 38–126)
ALT: 12 U/L (ref 0–44)
AST: 15 U/L (ref 15–41)
Anion gap: 8 (ref 5–15)
BILIRUBIN TOTAL: 0.4 mg/dL (ref 0.3–1.2)
BUN: 9 mg/dL (ref 8–23)
CALCIUM: 8.9 mg/dL (ref 8.9–10.3)
CO2: 27 mmol/L (ref 22–32)
CREATININE: 0.69 mg/dL (ref 0.44–1.00)
Chloride: 106 mmol/L (ref 98–111)
GFR, Est AFR Am: >60 mL/min (ref >60)
GFR, Estimated: >60 mL/min (ref >60)
GLUCOSE: 96 mg/dL (ref 70–99)
Potassium: 4.2 mmol/L (ref 3.5–5.1)
Sodium: 141 mmol/L (ref 135–145)
TOTAL PROTEIN: 7.2 g/dL (ref 6.5–8.1)

## 2018-04-01 LAB — CBC WITH DIFFERENTIAL (CANCER CENTER ONLY)
BASOS ABS: 0 10*3/uL (ref 0.0–0.1)
BASOS PCT: 0 %
Eosinophils Absolute: 0.2 10*3/uL (ref 0.0–0.5)
Eosinophils Relative: 2 %
HEMATOCRIT: 41 % (ref 34.8–46.6)
HEMOGLOBIN: 13.2 g/dL (ref 11.6–15.9)
LYMPHS PCT: 25 %
Lymphs Abs: 2.5 10*3/uL (ref 0.9–3.3)
MCH: 27.3 pg (ref 26.0–34.0)
MCHC: 32.2 g/dL (ref 32.0–36.0)
MCV: 84.7 fL (ref 81.0–101.0)
MONO ABS: 0.8 10*3/uL (ref 0.1–0.9)
Monocytes Relative: 8 %
NEUTROS ABS: 6.2 10*3/uL (ref 1.5–6.5)
NEUTROS PCT: 65 %
Platelet Count: 475 10*3/uL — ABNORMAL HIGH (ref 145–400)
RBC: 4.84 MIL/uL (ref 3.70–5.32)
RDW: 15 % (ref 11.1–15.7)
WBC Count: 9.7 10*3/uL (ref 3.9–10.0)

## 2018-04-01 LAB — FERRITIN: Ferritin: 102 ng/mL (ref 11–307)

## 2018-04-01 MED ORDER — AMLODIPINE BESY-BENAZEPRIL HCL 5-20 MG PO CAPS: 1.0000 | ORAL_CAPSULE | Freq: Every day | ORAL | 3 refills | Status: DC

## 2018-04-01 MED FILL — AMLODIPINE-BENAZEPRIL 5-20: 5-20 | 30 days supply | Qty: 30 | Fill #0

## 2018-04-01 NOTE — Progress Notes (Signed)
Hematology and Oncology Follow Up Visit  Karen Wall [id number] [birth year] 62 y.o. 04/01/2018   Principle Diagnosis:   Thrombocytosis-iron deficiency  Current Therapy:    IV iron as indicated     Interim History:  [name] is back for follow-up.  She is doing okay.  We last saw her back in June.  She is had a relatively uneventful summer.  She been helping take care of her mom.  She is done well with the IV iron.  Her iron studies were okay the last time that we saw her.  Her ferritin was 277 with iron saturation of 26%.  She is having some problems with her blood pressure.  Her blood pressure is quite high.  I will start her on Lotrel (5/20) and let us see if this can lower it a little bit.  She is had no problems chewing ice.  She has had no change in bowel or bladder habits.  She has had no rashes, so that on her upper chest and back which she thinks is from thyroid replacement therapy with Armour Thyroid.    Overall, her performance status is ECOG 1.  Medications:  Current Outpatient Medications:  .  acetaminophen (TYLENOL) 500 MG tablet, Take 500 mg by mouth every 6 (six) hours as needed for pain., Disp: , Rfl:  .  buPROPion (WELLBUTRIN SR) 150 MG 12 hr tablet, Take 1 tablet (150 mg total) by mouth 2 (two) times daily., Disp: 60 tablet, Rfl: 6 .  Cholecalciferol (VITAMIN D3) 5000 units CAPS, Take 1 capsule by mouth daily., Disp: , Rfl:  .  Cyanocobalamin (VITAMIN B-12 PO), Take by mouth., Disp: , Rfl:  .  IBUPROFEN PO, Take 200 mg by mouth as needed. , Disp: , Rfl:  .  LORazepam (ATIVAN) 1 MG tablet, Take 1/2 or 1 tablet at bedtime as needed for insomnia, Disp: 30 tablet, Rfl: 1 .  thyroid (ARMOUR THYROID) 60 MG tablet, Alternate 1 with 1.5 tablets every other day, Disp: 90 tablet, Rfl: 3 .  nitroGLYCERIN (NITRODUR - DOSED IN MG/24 HR) 0.2 mg/hr patch, Apply 1/4th patch to affected ankle, change daily, Disp: 30 patch, Rfl: 1  Allergies:  Allergies  Allergen  Reactions  . Penicillins     unknown    Past Medical History, Surgical history, Social history, and Family History were reviewed and updated.  Review of Systems: Review of Systems  Constitutional: Negative.   HENT:  Negative.   Eyes: Negative.   Respiratory: Negative.   Cardiovascular: Negative.   Gastrointestinal: Negative.   Endocrine: Negative.   Genitourinary: Negative.    Musculoskeletal: Positive for arthralgias and myalgias.  Skin: Negative.   Neurological: Negative.   Hematological: Negative.   Psychiatric/Behavioral: Negative.     Physical Exam:  weight is 226 lb 12.8 oz (102.9 kg). Her oral temperature is 98.3 F (36.8 C). Her blood pressure is 143/97 (abnormal) and her pulse is 76. Her respiration is 20 and oxygen saturation is 96%.   Wt Readings from Last 3 Encounters:  04/01/18 226 lb 12.8 oz (102.9 kg)  02/24/18 224 lb (101.6 kg)  01/14/18 222 lb 3.2 oz (100.8 kg)    Physical Exam  Constitutional: She is oriented to person, place, and time.  HENT:  Head: Normocephalic and atraumatic.  Mouth/Throat: Oropharynx is clear and moist.  Eyes: Pupils are equal, round, and reactive to light. EOM are normal.  Neck: Normal range of motion.  Cardiovascular: Normal rate, regular rhythm and normal heart sounds.  Pulmonary/Chest: Effort normal and breath sounds normal. Respiratory distress:    Abdominal: Soft. Bowel sounds are normal.  Musculoskeletal: Normal range of motion. She exhibits no edema, tenderness or deformity.  Lymphadenopathy:    She has no cervical adenopathy.  Neurological: She is alert and oriented to person, place, and time.  Skin: Skin is warm and dry. No rash noted. No erythema.  Psychiatric: She has a normal mood and affect. Her behavior is normal. Judgment and thought content normal.  Vitals reviewed.    Lab Results  Component Value Date   WBC 9.7 04/01/2018   HGB 13.2 04/01/2018   HCT 41.0 04/01/2018   MCV 84.7 04/01/2018   PLT 475  (H) 04/01/2018     Chemistry      Component Value Date/Time   NA 139 12/27/2017 0902   K 4.4 12/27/2017 0902   CL 103 12/27/2017 0902   CO2 27 12/27/2017 0902   BUN 12 12/27/2017 0902   CREATININE 0.67 12/27/2017 0902   CREATININE 0.74 05/18/2015 1356      Component Value Date/Time   CALCIUM 9.6 12/27/2017 0902   ALKPHOS 64 12/27/2017 0902   AST 18 12/27/2017 0902   ALT 15 12/27/2017 0902   BILITOT 0.3 12/27/2017 0902       Impression and Plan: [name] is a 62 year old white female.  She is doing well with respect to her anemia.  I would think that her iron deficiency should not be a problem given the fact that her MCV is on the rise.  Her platelet count is still on the higher side.  It is really not all that bad.  It might be reactive.  She does have inflammatory issues with her ankles.  I will see her back after the holidays now.  I think that this would be reasonable.    [name], MD 9/17/201910:05 AM

## 2018-04-03 ENCOUNTER — Telehealth: Payer: Self-pay

## 2018-04-03 NOTE — Telephone Encounter (Signed)
-----   Message from Darreld Mclean, MD sent at 04/01/2018  1:03 PM EDT ----- Sure, will take care of it.  Thanks Gerilyn Pilgrim can we please schedule her to be seen in about one month- thank you ----- Message ----- From: [name], MD Sent: 04/01/2018  10:18 AM EDT To: Darreld Mclean, MD  Jess:  I started [name] on Lotrel (5/20) for her high BP.  She says you do not see her until February 2020.  Can you have her come back sooner for a BP check??  Laurey Arrow

## 2018-04-03 NOTE — Telephone Encounter (Signed)
Left message to return call. Left message to schedule appointment for 1 month. [place] for pec to discuss and schedule appointment.

## 2018-04-10 MED FILL — ARMOUR THYROID 60 MG TABLET: 60 | 90 days supply | Qty: 90 | Fill #1

## 2018-04-10 MED FILL — BUPROPION SR 150 MG TABLET: 150 | 30 days supply | Qty: 60 | Fill #1

## 2018-04-29 ENCOUNTER — Telehealth: Payer: Self-pay | Admitting: Hematology & Oncology

## 2018-04-29 NOTE — Telephone Encounter (Signed)
Faxed medical records to: Javier Gl[facility]ier: [phone] F: [phone]  for  Kattie Perl 04/07/1976 CLAIM: [id number]     COPY SCANNED

## 2018-05-05 ENCOUNTER — Ambulatory Visit: Payer: 59 | Admitting: Podiatry

## 2018-05-05 ENCOUNTER — Encounter: Payer: Self-pay | Admitting: Podiatry

## 2018-05-05 DIAGNOSIS — M659 Synovitis and tenosynovitis, unspecified: Secondary | ICD-10-CM | POA: Diagnosis not present

## 2018-05-05 DIAGNOSIS — M722 Plantar fascial fibromatosis: Secondary | ICD-10-CM

## 2018-05-05 DIAGNOSIS — M216X1 Other acquired deformities of right foot: Secondary | ICD-10-CM | POA: Diagnosis not present

## 2018-05-05 DIAGNOSIS — M216X2 Other acquired deformities of left foot: Secondary | ICD-10-CM | POA: Diagnosis not present

## 2018-05-05 MED FILL — AMLODIPINE-BENAZEPRIL 5-20: 5-20 | 30 days supply | Qty: 30 | Fill #1

## 2018-05-05 MED FILL — BUPROPION SR 150 MG TABLET: 150 | 30 days supply | Qty: 60 | Fill #2

## 2018-05-05 NOTE — Progress Notes (Signed)
Subjective: 62 year old female presents for follow up for chronic bilateral foot condition. Patient brought wornout orthotics that is in need of repair. Stated that she is functional for a few hours as long as she keeps her braces.   HPI: Patient has completed her physical therapy. Wearing orthotics and Ankle brace to ambulate.   Objective: Dermatologic:No abnormal findings. Vascular:Pedal pulses are all palpable. Orthopedic: Improved plantar arch pain on left through physical therapy. Still having pain at the lesser MPJ area left foot with increased weight bearing. Residual pain with weight bearing over the lesser MPJ area left. Pain at Posterior Tibialis tendon insertion site after prolonged weight bearing. Excess sagittal plane motion of the first ray with loading of forefoot bilateral. Increased STJ hyperpronation with weight bearing. Neurologic:All epicritic and tactile sensations grossly intact.  Assessment: Lesser metatarsalgia left foot with increased weight bearing. Recurring pain at the Posterior tibialis tendon with increased weight bearing. Hypermobile first ray with STJ hyperpronation bilateral.  Treatment: Will send off orthotics for repair. Continue with modified weight bearing activities and exercise such as stationary bike, water exercise, etc. Continue with Ankle brace as needed. Increase weight bearing activity as tolerated. Patient was informed to seek another podiatrist for continuation of care.

## 2018-05-05 NOTE — Patient Instructions (Signed)
Follow up on bilateral foot pain. Doing well with modified weight bearing activity and with Orthotics, and Ankle brace. Continue current level of care. Advised to seek another podiatrist for continuation of foot care.

## 2018-05-06 ENCOUNTER — Encounter: Payer: Self-pay | Admitting: Neurology

## 2018-05-06 ENCOUNTER — Ambulatory Visit: Payer: 59 | Admitting: Neurology

## 2018-05-06 VITALS — BP 114/69 | HR 110 | Ht 62.0 in | Wt 220.5 lb

## 2018-05-06 DIAGNOSIS — R200 Anesthesia of skin: Secondary | ICD-10-CM

## 2018-05-06 DIAGNOSIS — R202 Paresthesia of skin: Secondary | ICD-10-CM

## 2018-05-06 HISTORY — DX: Anesthesia of skin: R20.0

## 2018-05-06 NOTE — Progress Notes (Signed)
PATIENT: Karen Wall DOB: February 10, 1956  Chief Complaint  Patient presents with  . Facial numbness/tingling    Reports intermittent numbness/tingling in her left cheek, lip and chin, along with burning and crawling sensations.  Symptoms started years ago.  She has previously had a normal MRI brain.  She was just prescribed Wellbutrin for depression and it seemed to minimize her facial symptoms.  She weaned herself off the medication to test this theory.  Her symptoms worsened again.  Marland Kitchen PCP    Copland, Gay Filler, MD     HISTORICAL  Karen Wall is a 61 year old female, seen in request by her primary care physician Dr. Lamar Blinks for evaluation of left facial paresthesia, initial evaluation was on May 06, 2018.  I have reviewed and summarized the referring note from the referring physician. She has past medical history of hypertension, hypothyroidism, on supplement,  She initially noted left upper lip numbness, felt funny when she touch it many years ago, reported normal MRI of the brain, was seen by neurologist at [facility] at that time, she was treated with Wellbutrin for anxiety and depression, her symptoms has much improved, there was no limitation on her function.  She recently stopped her Wellbutrin 150 mg twice a day, then noticed recurrence of symptoms, numbness of left upper lip, oftentimes spreading to left lower lip, she denies shooting pain, no visual loss, no chewing difficulties.   REVIEW OF SYSTEMS: Full 14 system review of systems performed and notable only for weight gain, fatigue, palpitation, sweating legs, rash, itching, shortness of breath, anemia, anxiety, cramps, numbness, insomnia, depression, anxiety, decreased energy All other review of systems were negative.  ALLERGIES: Allergies  Allergen Reactions  . Penicillins     unknown    HOME MEDICATIONS: Current Outpatient Medications  Medication Sig Dispense Refill  . acetaminophen  (TYLENOL) 500 MG tablet Take 500 mg by mouth every 6 (six) hours as needed for pain.    Marland Kitchen amLODipine-benazepril (LOTREL) 5-20 MG capsule Take 1 capsule by mouth daily. 30 capsule 3  . buPROPion (WELLBUTRIN SR) 150 MG 12 hr tablet Take 1 tablet (150 mg total) by mouth 2 (two) times daily. 60 tablet 6  . Cholecalciferol (VITAMIN D3) 5000 units CAPS Take 1 capsule by mouth daily.    . Cyanocobalamin (VITAMIN B-12 PO) Take by mouth.    . IBUPROFEN PO Take 200 mg by mouth as needed.     Marland Kitchen LORazepam (ATIVAN) 1 MG tablet Take 1/2 or 1 tablet at bedtime as needed for insomnia 30 tablet 1  . nitroGLYCERIN (NITRODUR - DOSED IN MG/24 HR) 0.2 mg/hr patch Apply 1/4th patch to affected ankle, change daily 30 patch 1  . thyroid (ARMOUR THYROID) 60 MG tablet Alternate 1 with 1.5 tablets every other day 90 tablet 3   No current facility-administered medications for this visit.     PAST MEDICAL HISTORY: Past Medical History:  Diagnosis Date  . A-fib [facility])    history of  . Acquired flat foot   . Ankle pain    bilateral  . Depression   . GERD (gastroesophageal reflux disease)   . H/O hiatal hernia    "very small "  . Hyperlipidemia   . Hypertension   . 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 Numbness    Left side of face around mouth - unknown etiology  . Numbness and tingling    left side - facial  . Palpitations    Occurred during hyperthyroid  .  Thrombocytosis ([place]) 11/22/2017  . Thyroid cancer ([place])   . Thyroid disease    hyperthyroidism  . Vitamin D deficiency     PAST SURGICAL HISTORY: Past Surgical History:  Procedure Laterality Date  . BREAST BIOPSY     age 61 - negative  . CHOLECYSTECTOMY  1992  . THYROIDECTOMY N/A 07/03/2013   Procedure: TOTAL THYROIDECTOMY;  Surgeon: Earnstine Regal, MD;  Location: [facility];  Service: General;  Laterality: N/A;    FAMILY HISTORY: Family History  Problem Relation Age of Onset  . Hypertension Mother   . Diabetes Mother   . Hyperlipidemia Mother   .  COPD Mother   . CAD Father        MI in his 23s  . Diabetes Father   . Hyperlipidemia Father   . Hypertension Father   . Mental illness Father   . Mental illness Maternal Grandmother   . Stroke Maternal Grandmother   . Diabetes Maternal Grandmother   . Stroke Maternal Grandfather   . Heart attack Maternal Grandfather   . Congestive Heart Failure Paternal Grandmother   . Heart attack Paternal Grandfather     SOCIAL HISTORY: Social History   Socioeconomic History  . Marital status: Divorced    Spouse name: Not on file  . Number of children: 1  . Years of education: college  . Highest education level: Associate degree: occupational, Hotel manager, or vocational program  Occupational History  . Occupation: Therapist, sports  Social Needs  . Financial resource strain: Not on file  . Food insecurity:    Worry: Not on file    Inability: Not on file  . Transportation needs:    Medical: Not on file    Non-medical: Not on file  Tobacco Use  . Smoking status: Never Smoker  . Smokeless tobacco: Never Used  Substance and Sexual Activity  . Alcohol use: No    Alcohol/week: 0.0 standard drinks  . Drug use: No  . Sexual activity: Never  Lifestyle  . Physical activity:    Days per week: Not on file    Minutes per session: Not on file  . Stress: Not on file  Relationships  . Social connections:    Talks on phone: Not on file    Gets together: Not on file    Attends religious service: Not on file    Active member of club or organization: Not on file    Attends meetings of clubs or organizations: Not on file    Relationship status: Not on file  . Intimate partner violence:    Fear of current or ex partner: Not on file    Emotionally abused: Not on file    Physically abused: Not on file    Forced sexual activity: Not on file  Other Topics Concern  . Not on file  Social History Narrative   Lives at home alone.   Right-handed.   2 cups caffeine daily.     PHYSICAL EXAM   Vitals:    05/06/18 1002  BP: 114/69  Pulse: (!) 110  Weight: 220 lb 8 oz (100 kg)  Height: 5\' 2"  (1.575 m)    Not recorded      Body mass index is 40.33 kg/m.  PHYSICAL EXAMNIATION:  Gen: NAD, conversant, well nourised, obese, well groomed                     Cardiovascular: Regular rate rhythm, no peripheral edema, warm, nontender. Eyes: Conjunctivae clear without  exudates or hemorrhage Neck: Supple, no carotid bruits. Pulmonary: Clear to auscultation bilaterally   NEUROLOGICAL EXAM:  MENTAL STATUS: Speech:    Speech is normal; fluent and spontaneous with normal comprehension.  Cognition:     Orientation to time, place and person     Normal recent and remote memory     Normal Attention span and concentration     Normal Language, naming, repeating,spontaneous speech     Fund of knowledge   CRANIAL NERVES: CN II: Visual fields are full to confrontation. Fundoscopic exam is normal with sharp discs and no vascular changes. Pupils are round equal and briskly reactive to light. CN III, IV, VI: extraocular movement are normal. No ptosis. CN V: Facial sensation is intact to pinprick in all 3 divisions bilaterally. Corneal responses are intact.  CN VII: Face is symmetric with normal eye closure and smile. CN VIII: Hearing is normal to rubbing fingers CN IX, X: Palate elevates symmetrically. Phonation is normal. CN XI: Head turning and shoulder shrug are intact CN XII: Tongue is midline with normal movements and no atrophy.  MOTOR: There is no pronator drift of out-stretched arms. Muscle bulk and tone are normal. Muscle strength is normal.  REFLEXES: Reflexes are 2+ and symmetric at the biceps, triceps, knees, and ankles. Plantar responses are flexor.  SENSORY: Intact to light touch, pinprick, positional sensation and vibratory sensation are intact in fingers and toes.  COORDINATION: Rapid alternating movements and fine finger movements are intact. There is no dysmetria on  finger-to-nose and heel-knee-shin.    GAIT/STANCE: Posture is normal. Gait is steady with normal steps, base, arm swing, and turning. Heel and toe walking are normal. Tandem gait is normal.  Romberg is absent.   DIAGNOSTIC DATA (LABS, IMAGING, TESTING) - I reviewed patient records, labs, notes, testing and imaging myself where available.   ASSESSMENT AND PLAN  Karen Wall is a 62 y.o. female   Left upper and lower lip paresthesia,  In the territory of left V1, V2 distribution, correlate with her tapering off Wellbutrin treatment,  Normal neurological examination,  No shooting pain, no limitation in her activity,  I have offered potential MRI of trigeminal ganglion with without contrast to rule out structural lesion,  She will call me back if she wants further evaluation,    Marcial Pacas, M.D. Ph.D.  [facility] [address] [place], [place] [address] Ph: [phone] Fax: [phone]  CC:  Copland, Gay Filler, MD

## 2018-05-11 NOTE — Progress Notes (Signed)
[facility] [address] [place], [place] [address] [phone]  Date:  05/12/2018   Name:  Karen Wall   DOB:  04/18/1956   MRN:  [id number]  PCP:  Darreld Mclean, MD    Chief Complaint: Hypertension (follow up)   History of Present Illness:  Karen Wall is a 62 y.o. very pleasant female patient who presents with the following:  Following up on her HTN today History of thyroid cancer s/p thyroidectomy in 2014, hyperlipidemia, HTN  She sees Dr. Marin Olp for her history of thyroid cancer and thrombocytosis/ iron def She sees Benjiman Core for her thyroid management otherwise  Seen very recently by Dr. Krista Blue for left sided facial numbness; Left upper and lower lip paresthesia,             In the territory of left V1, V2 distribution, correlate with her tapering off Wellbutrin treatment,             Normal neurological examination,             No shooting pain, no limitation in her activity,             I have offered potential MRI of trigeminal ganglion with without contrast to rule out structural lesion,             She will call me back if she wants further evaluation.  Flu: do today  She is thinking about shingrix but does not wish to start today  She is thinking about doing an MRI as above, Karen Wall is still concerned about how much it might cost.  unfortunately it is difficult for her to get a clear answer about potential cost here  Taking wellbutrin helps with the numbness - she had tapered off this drug for a little while but then went back on and her sx did fluctuate accordingly Amlodipine/ benazepril for her HTN wellbutrin  BP Readings from Last 3 Encounters:  05/12/18 110/80  05/06/18 114/69  04/01/18 (!) 143/97   She has been checking her BP at home and brings me a list of her home readings - BP and pulse She is running 110s- 120s/ 70s- 80s generally, pulse never over 90.  All are  generally well controlled as above She is not noticing any lightheaded feeling or other suggestion of her BP being too low  No CP or SOB, but she is noted to be tachycardic today which is not typical for her. However pt notes that her pulse was up at the endocrinology office just recently as well  She does not have any CP or SOB However she does have occasional palpitations- this is not new however  Pulse Readings from Last 3 Encounters:  05/12/18 (!) 126  05/06/18 (!) 110  04/01/18 76    Wt Readings from Last 3 Encounters:  05/12/18 225 lb (102.1 kg)  05/06/18 220 lb 8 oz (100 kg)  04/01/18 226 lb 12.8 oz (102.9 kg)     Patient Active Problem List   Diagnosis Date Noted  . Numbness and tingling of left side of face 05/06/2018  . 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 Thrombocytosis ([place]) 11/22/2017  . Bilateral ankle pain 10/15/2017  . IDA (iron deficiency anemia) 07/23/2016  . Vitamin D deficiency 03/15/2016  . Low vitamin B12 level 03/15/2016  . Dyspnea 05/28/2014  . Chest tightness 05/28/2014  . 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 A-fib [facility])    history of  . Acquired flat foot   . Ankle pain    bilateral  . Depression   . GERD (gastroesophageal reflux disease)   . H/O hiatal hernia    "very small "  . Hyperlipidemia   . Hypertension   . 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 Numbness    Left side of face around mouth - unknown etiology  . Numbness and tingling    left side - facial  . Palpitations    Occurred during hyperthyroid  . Thrombocytosis ([place]) 11/22/2017  . Thyroid cancer ([place])   . Thyroid disease    hyperthyroidism  . Vitamin D deficiency     Past Surgical History:  Procedure Laterality Date  . BREAST BIOPSY     age 7 - negative  . CHOLECYSTECTOMY  1992  . THYROIDECTOMY N/A 07/03/2013   Procedure: TOTAL THYROIDECTOMY;  Surgeon:  Earnstine Regal, MD;  Location: [facility];  Service: General;  Laterality: N/A;    Social History   Tobacco Use  . Smoking status: Never Smoker  . Smokeless tobacco: Never Used  Substance Use Topics  . Alcohol use: No    Alcohol/week: 0.0 standard drinks  . Drug use: No    Family History  Problem Relation Age of Onset  . Hypertension Mother   . Diabetes Mother   . Hyperlipidemia Mother   . COPD Mother   . CAD Father        MI in his 60s  . Diabetes Father   . Hyperlipidemia Father   . Hypertension Father   . Mental illness Father   . Mental illness Maternal Grandmother   . Stroke Maternal Grandmother   . Diabetes Maternal Grandmother   . Stroke Maternal Grandfather   . Heart attack Maternal Grandfather   . Congestive Heart Failure Paternal Grandmother   . Heart attack Paternal Grandfather     Allergies  Allergen Reactions  . Penicillins     unknown    Medication list has been reviewed and updated.  Current Outpatient Medications on File Prior to Visit  Medication Sig Dispense Refill  . acetaminophen (TYLENOL) 500 MG tablet Take 500 mg by mouth every 6 (six) hours as needed for pain.    Marland Kitchen amLODipine-benazepril (LOTREL) 5-20 MG capsule Take 1 capsule by mouth daily. 30 capsule 3  . buPROPion (WELLBUTRIN SR) 150 MG 12 hr tablet Take 1 tablet (150 mg total) by mouth 2 (two) times daily. 60 tablet 6  . Cholecalciferol (VITAMIN D3) 5000 units CAPS Take 1 capsule by mouth daily.    . Cyanocobalamin (VITAMIN B-12 PO) Take by mouth.    . IBUPROFEN PO Take 200 mg by mouth as needed.     Marland Kitchen LORazepam (ATIVAN) 1 MG tablet Take 1/2 or 1 tablet at bedtime as needed for insomnia 30 tablet 1  . thyroid (ARMOUR THYROID) 60 MG tablet Alternate 1 with 1.5 tablets every other day 90 tablet 3   No current facility-administered medications on file prior to visit.     Review of Systems:  As per HPI- otherwise negative. No fever or chills Mood is ok   Physical Examination: Vitals:    05/12/18 1102  BP: 110/80  Pulse: (!) 126  Resp: 16  Temp: 98.1 F (36.7 C)  SpO2: 98%   Vitals:   05/12/18 1102  Weight: 225 lb (102.1 kg)  Height: 5'  2" (1.575 m)   Body mass index is 41.15 kg/m. Ideal Body Weight: Weight in (lb) to have BMI = 25: 136.4  GEN: WDWN, NAD, Non-toxic, A & O x 3, looks well, obese  HEENT: Atraumatic, Normocephalic. Neck supple. No masses, No LAD. Ears and Nose: No external deformity. CV: RRR but tachy, No M/G/R. No JVD. No thrill. No extra heart sounds. PULM: CTA B, no wheezes, crackles, rhonchi. No retractions. No resp. distress. No accessory muscle use EXTR: No c/c/e NEURO Normal gait.  PSYCH: Normally interactive. Conversant. Not depressed or anxious appearing.  Calm demeanor.   EKG: rate of 101, sinus. No ST change  C/w 2015 no concerning change noted  Assessment and Plan: Need for influenza vaccination - Plan: Flu Vaccine QUAD 6+ mos PF IM (Fluarix Quad PF)  Tachycardia - Plan: EKG 12-Lead  Postsurgical hypothyroidism  Essential hypertension  BP is well controlled on current regimen. If pt starts to have any sx of hypotension will reduce her dose Tachycardia- pt checks pulse daily at home and always ok ?nerves.  She also had her thyroid labs drawn today, her dose was recently increased. She will followup with endocrinology for this issue  Flu shot given Discussed shingrix with her today   Signed Lamar Blinks, MD

## 2018-05-12 ENCOUNTER — Encounter: Payer: Self-pay | Admitting: Family Medicine

## 2018-05-12 ENCOUNTER — Ambulatory Visit: Payer: 59 | Admitting: Family Medicine

## 2018-05-12 ENCOUNTER — Encounter: Payer: Self-pay | Admitting: Internal Medicine

## 2018-05-12 ENCOUNTER — Other Ambulatory Visit: Payer: Self-pay | Admitting: Internal Medicine

## 2018-05-12 ENCOUNTER — Other Ambulatory Visit (INDEPENDENT_AMBULATORY_CARE_PROVIDER_SITE_OTHER): Payer: 59

## 2018-05-12 ENCOUNTER — Telehealth: Payer: Self-pay | Admitting: Neurology

## 2018-05-12 VITALS — BP 110/80 | HR 101 | Temp 98.1°F | Resp 16 | Ht 62.0 in | Wt 225.0 lb

## 2018-05-12 DIAGNOSIS — Z23 Encounter for immunization: Secondary | ICD-10-CM | POA: Diagnosis not present

## 2018-05-12 DIAGNOSIS — E890 Postprocedural hypothyroidism: Secondary | ICD-10-CM

## 2018-05-12 DIAGNOSIS — R202 Paresthesia of skin: Secondary | ICD-10-CM

## 2018-05-12 DIAGNOSIS — I10 Essential (primary) hypertension: Secondary | ICD-10-CM | POA: Insufficient documentation

## 2018-05-12 DIAGNOSIS — R000 Tachycardia, unspecified: Secondary | ICD-10-CM | POA: Diagnosis not present

## 2018-05-12 HISTORY — DX: Tachycardia, unspecified: R00.0

## 2018-05-12 HISTORY — DX: Essential (primary) hypertension: I10

## 2018-05-12 LAB — TSH: TSH: 2.05 u[IU]/mL (ref 0.35–4.50)

## 2018-05-12 LAB — T3, FREE: T3, Free: 3.9 pg/mL (ref 2.3–4.2)

## 2018-05-12 LAB — T4, FREE: FREE T4: 0.57 ng/dL — AB (ref 0.60–1.60)

## 2018-05-12 MED ORDER — THYROID 60 MG PO TABS: ORAL_TABLET | ORAL | 3 refills | Status: DC

## 2018-05-12 MED ORDER — THYROID 15 MG PO TABS: 15.0000 mg | ORAL_TABLET | Freq: Every day | ORAL | 3 refills | Status: DC

## 2018-05-12 MED FILL — ARMOUR THYROID 15 MG TABLET: 15 | 90 days supply | Qty: 90 | Fill #0

## 2018-05-12 NOTE — Telephone Encounter (Signed)
I have put in order for MRI trigeminal w/wo,

## 2018-05-12 NOTE — Patient Instructions (Signed)
Your BP looks fine- continue current dose of medications Keep an eye on your pulse- if you start running high please do alert me If you have any CP or SOB alert me as well

## 2018-05-13 ENCOUNTER — Telehealth: Payer: Self-pay | Admitting: Neurology

## 2018-05-13 NOTE — Telephone Encounter (Signed)
pt states she will call me back when ready to schedule  [facility] Auth: [place] Ref # [id number]

## 2018-06-04 MED FILL — BUPROPION SR 150 MG TABLET: 150 | 30 days supply | Qty: 60 | Fill #3

## 2018-06-04 MED FILL — LORazepam 1 MG TABS: 1 | 30 days supply | Qty: 30 | Fill #1

## 2018-06-04 MED FILL — AMLODIPINE-BENAZEPRIL 5-20: 5-20 | 30 days supply | Qty: 30 | Fill #2

## 2018-07-04 MED FILL — AMLODIPINE-BENAZEPRIL 5-20: 5-20 | 30 days supply | Qty: 30 | Fill #3

## 2018-07-04 MED FILL — BUPROPION SR 150 MG TABLET: 150 | 30 days supply | Qty: 60 | Fill #4

## 2018-07-16 DIAGNOSIS — N2889 Other specified disorders of kidney and ureter: Secondary | ICD-10-CM

## 2018-07-16 HISTORY — DX: Other specified disorders of kidney and ureter: N28.89

## 2018-07-17 ENCOUNTER — Encounter: Payer: Self-pay | Admitting: Internal Medicine

## 2018-07-17 ENCOUNTER — Ambulatory Visit (INDEPENDENT_AMBULATORY_CARE_PROVIDER_SITE_OTHER): Payer: 59 | Admitting: Internal Medicine

## 2018-07-17 VITALS — BP 118/82 | HR 95 | Ht 62.0 in | Wt 224.0 lb

## 2018-07-17 DIAGNOSIS — E890 Postprocedural hypothyroidism: Secondary | ICD-10-CM | POA: Diagnosis not present

## 2018-07-17 DIAGNOSIS — E538 Deficiency of other specified B group vitamins: Secondary | ICD-10-CM | POA: Diagnosis not present

## 2018-07-17 DIAGNOSIS — E559 Vitamin D deficiency, unspecified: Secondary | ICD-10-CM

## 2018-07-17 LAB — T4, FREE: FREE T4: 0.68 ng/dL (ref 0.60–1.60)

## 2018-07-17 LAB — TSH: TSH: 0.81 u[IU]/mL (ref 0.35–4.50)

## 2018-07-17 LAB — T3, FREE: T3 FREE: 4.1 pg/mL (ref 2.3–4.2)

## 2018-07-17 LAB — VITAMIN D 25 HYDROXY (VIT D DEFICIENCY, FRACTURES): VITD: 31.38 ng/mL (ref 30.00–100.00)

## 2018-07-17 LAB — VITAMIN B12: VITAMIN B 12: 246 pg/mL (ref 211–911)

## 2018-07-17 NOTE — Progress Notes (Signed)
Patient ID: Karen Wall, female   DOB: 1956-01-15, 63 y.o.   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 Last visit 6 months ago.  Reviewed history: In 03/2013, she developed fevers >> TSH checked >> hyperthyroidism (TMNG) >> started MMI >> continued for 1 month.   She also had a thyroid U/S in 03/2013 >> 2 thyroid nodules >> FNA of each in 04/2013 was negative. However, she opted for thyroidectomy for the TMNG.  She had total thyroidectomy 06/2013>> one focus of follicular variant of PTC of 0.8 cm : Specimen: Thyroid Procedure: Thyroidectomy Specimen Integrity (intact/fragmented): Intact Tumor focality: Unifocal Dominant tumor: Right lobe Maximum tumor size (cm): 0.8 cm Tumor laterality: Right Histologic type (including subtype and/or unique features as applicable): Follicular variant papillary thyroid carcinoma Tumor capsule: N/A Extrathyroidal extension: No Margins: Free of tumor Lymph - Vascular invasion: No Capsular invasion with degree of invasion if present: N/A Lymph nodes: # examined 0; # positive; N/A TNM code: pT1a, pNX Non-neoplastic thyroid: Multinodular goiter. Comments: There is a 0.8 cm nodule of papillary thyroid carcinoma, follicular variant in the right superior lobe which does not involve the margin and is confined within the right lobe. The remainder of the thyroid shows features of multinodular goiter.  Dr Loanne Drilling advised against RAI Tx and against following Thyroglobulin. She then sought a second opinion with me >> I I recommended the same.  Neck U/S (04/02/2014): No evidence of abnormal soft tissue in the surgical thyroid bed. Nonenlarged cervical lymph nodes identified.  Neck U/S (03/29/2016):  normal post thyroidectomy appearance.  No suspicious lymphadenopathy  Post-surgical hypothyroidism:  Reviewed  and addended history: Started on 100 mcg Synthroid  - brand name after sx >> TSH 0.074 >> dose decrease to 50 mcg >> pt felt terrible >> TSH 14.6 >> dose increased to 75 mcg, but TSH returned above goal, at 2.76 >> we increased to 88 mcg daily. However, We added T3 to help with fatigue (Armour) 02/2017 >> then 60 and 90 mg every other day.   However, after the increase in dose >> rash, palpitations, panic attacks, SOB but feeling more energetic and less depressed >> I suggested to change to Naturethroid 81.25 mg. This was on back order >> now Levothyroxine 100 mcg + Liothyronine 5 mcg daily.  She does not feel too well on this combination.  She is still very tired, crushing around 3 PM, and feels that she gained weight.  She did not feel well on the 90 alternating with 60 mg qod >> emotional fluctuation.   Pt is on Armour 60 + 15 mg daily, taken: - in am - fasting - at least 30 min from b'fast - no Ca, Fe, MVI, PPIs - not on Biotin  TFTs reviewed: Lab Results  Component Value Date   TSH 2.05 05/12/2018   TSH 9.69 (H) 03/10/2018   TSH 0.06 (L) 01/14/2018   TSH 0.45 09/03/2017   TSH 3.76 07/17/2017   TSH 3.42 06/03/2017   TSH 9.64 (H) 04/24/2017   TSH 3.23 03/15/2017   TSH 1.89 03/15/2016   TSH 1.57 03/07/2015   FREET4 0.57 (L) 05/12/2018   FREET4 0.44 (L) 03/10/2018   FREET4 1.18 01/14/2018   FREET4 0.97 09/03/2017   FREET4 0.58 (L) 07/17/2017   FREET4 0.52 (L) 06/03/2017   FREET4 0.52 (L) 04/24/2017   FREET4 0.78 03/15/2017  FREET4 1.07 03/15/2016   FREET4 0.96 03/07/2015   Pt denies: - feeling nodules in neck - hoarseness - dysphagia - choking - SOB with lying down  Latest vitamin D normal: Lab Results  Component Value Date   VD25OH 31.42 01/14/2018   VD25OH 30.59 06/03/2017   VD25OH 28.16 (L) 04/24/2017   VD25OH 28.43 (L) 03/15/2017   VD25OH 28.02 (L) 05/21/2016   VD25OH 19.16 (L) 03/15/2016   VD25OH 25 (L) 05/18/2015   VD25OH 18.97 (L) 08/24/2014  She  was on Ergocalciferol 50,000 IU once a week, now on vitamin D 5000 units daily.  B12 was normal at last check Lab Results  Component Value Date   VITAMINB12 396 01/14/2018   VITAMINB12 530 03/15/2017   VITAMINB12 1,228 (H) 05/18/2015   VITAMINB12 237 03/07/2015   VITAMINB12 252 08/24/2014   On 1000 mcg B12 sublingual q 3 days.  She was taken out of work in the past for bilateral ankle pain and arch collapse. 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  ROS: Constitutional: no weight gain/no weight loss, no fatigue, no subjective hyperthermia, no subjective hypothermia Eyes: no blurry vision, no xerophthalmia ENT: no sore throat, + see HPI Cardiovascular: no CP/no SOB/no palpitations/no leg swelling Respiratory: no cough/no SOB/no wheezing Gastrointestinal: no N/no V/no D/no C/no acid reflux Musculoskeletal: no muscle aches/no joint aches Skin: no rashes, no hair loss Neurological: no tremors/no numbness/no tingling/no dizziness  I reviewed pt's medications, allergies, PMH, social hx, family hx, and changes were documented in the history of present illness. Otherwise, unchanged from my initial visit note.   Past Medical History:  Diagnosis Date  . A-fib [facility])    history of  . Acquired flat foot   . Ankle pain    bilateral  . Depression   . GERD (gastroesophageal reflux disease)   . H/O hiatal hernia    "very small "  . Hyperlipidemia   . Hypertension   . 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 Numbness    Left side of face around mouth - unknown etiology  . Numbness and tingling    left side - facial  . Palpitations    Occurred during hyperthyroid  . Thrombocytosis ([place]) 11/22/2017  . Thyroid cancer ([place])   . Thyroid disease    hyperthyroidism  . Vitamin D deficiency    Past Surgical History:  Procedure Laterality Date  . BREAST BIOPSY     age 33 - negative  . CHOLECYSTECTOMY  1992  . THYROIDECTOMY N/A 07/03/2013   Procedure: TOTAL THYROIDECTOMY;  Surgeon: Earnstine Regal, MD;  Location: [facility];  Service: General;  Laterality: N/A;   Social History   Socioeconomic History  . Marital status: Divorced    Spouse name: Not on file  . Number of children: 1  . Years of education: college  . Highest education level: Associate degree: occupational, Hotel manager, or vocational program  Occupational History  . Occupation: Therapist, sports  Social Needs  . Financial resource strain: Not on file  . Food insecurity:    Worry: Not on file    Inability: Not on file  . Transportation needs:    Medical: Not on file    Non-medical: Not on file  Tobacco Use  . Smoking status: Never Smoker  . Smokeless tobacco: Never Used  Substance and Sexual Activity  . Alcohol use: No    Alcohol/week: 0.0 standard drinks  . Drug use: No  . Sexual activity: Never  Lifestyle  . Physical activity:  Days per week: Not on file    Minutes per session: Not on file  . Stress: Not on file  Relationships  . Social connections:    Talks on phone: Not on file    Gets together: Not on file    Attends religious service: Not on file    Active member of club or organization: Not on file    Attends meetings of clubs or organizations: Not on file    Relationship status: Not on file  . Intimate partner violence:    Fear of current or ex partner: Not on file    Emotionally abused: Not on file    Physically abused: Not on file    Forced sexual activity: Not on file  Other Topics Concern  . Not on file  Social History Narrative   Lives at home alone.   Right-handed.   2 cups caffeine daily.   Current Outpatient Medications on File Prior to Visit  Medication Sig Dispense Refill  . acetaminophen (TYLENOL) 500 MG tablet Take 500 mg by mouth every 6 (six) hours as needed for pain.    Marland Kitchen amLODipine-benazepril (LOTREL) 5-20 MG capsule Take 1 capsule by mouth daily. 30 capsule 3  . buPROPion (WELLBUTRIN SR) 150 MG 12 hr tablet Take 1 tablet (150 mg total) by mouth 2 (two) times daily. 60 tablet 6   . Cholecalciferol (VITAMIN D3) 5000 units CAPS Take 1 capsule by mouth daily.    . Cyanocobalamin (VITAMIN B-12 PO) Take by mouth.    . IBUPROFEN PO Take 200 mg by mouth as needed.     Marland Kitchen LORazepam (ATIVAN) 1 MG tablet Take 1/2 or 1 tablet at bedtime as needed for insomnia 30 tablet 1  . thyroid (ARMOUR THYROID) 15 MG tablet Take 1 tablet (15 mg total) by mouth daily. Along with the 60 mg tablet. 90 tablet 3  . thyroid (ARMOUR THYROID) 60 MG tablet Take every day along with the 15 mg tablet. 90 tablet 3   No current facility-administered medications on file prior to visit.    Allergies  Allergen Reactions  . Penicillins     unknown   Family History  Problem Relation Age of Onset  . Hypertension Mother   . Diabetes Mother   . Hyperlipidemia Mother   . COPD Mother   . CAD Father        MI in his 77s  . Diabetes Father   . Hyperlipidemia Father   . Hypertension Father   . Mental illness Father   . Mental illness Maternal Grandmother   . Stroke Maternal Grandmother   . Diabetes Maternal Grandmother   . Stroke Maternal Grandfather   . Heart attack Maternal Grandfather   . Congestive Heart Failure Paternal Grandmother   . Heart attack Paternal Grandfather    PE: BP 118/82   Pulse 95   Ht '5\' 2"'$  (1.575 m)   Wt 224 lb (101.6 kg)   SpO2 96%   BMI 40.97 kg/m  Body mass index is 40.97 kg/m. Wt Readings from Last 3 Encounters:  07/17/18 224 lb (101.6 kg)  05/12/18 225 lb (102.1 kg)  05/06/18 220 lb 8 oz (100 kg)   Constitutional: overweight, in NAD Eyes: PERRLA, EOMI, no exophthalmos ENT: moist mucous membranes, no neck masses palpated, thyroidectomy scar healed, no cervical lymphadenopathy Cardiovascular: Tachycardia, RR, No MRG Respiratory: CTA B Gastrointestinal: abdomen soft, NT, ND, BS+ Musculoskeletal: no deformities, strength intact in all 4 Skin: moist, warm, no rashes Neurological: no  tremor with outstretched hands, DTR normal in all 4   ASSESSMENT: 1. Thyroid  cancer - Papillary Thy Ca, follicular variant  - subcm, w/o extension to capsule, lymph or blood vessel, and without neck extension - s/p total thyroidectomy in 06/2013  2. Postsurgical hypothyroidism  3. Vitamin D deficiency  4. Low vitamin B12   PLAN: 1. 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  She is stage I TNM. -No neck compression symptoms or neck masses palpated -Reviewed the latest thyroid ultrasound from 2017: No cancer recurrence or metastases in the neck. -We will need another thyroid ultrasound - at next visit  2. Post-surgical hypothyroidism -We previously increased the Armour dose from 60 mg daily to 60 alternating with 90 mg every other day.  However, she developed more anxiety, palpitations, and rash on her chest so I suggested to switch to Nature-Throid which is more pure.  This was on back order so we switched to levothyroxine and liothyronine.  At last visit she was on levothyroxine 100 mcg daily and liothyronine 5 mcg daily.  However, she felt tired and did not feel like herself so we switched to Armour 60 mg alternating with 90 mg every other day.  She is currently on Armour 75 mg daily.  She takes a 60+15 mg tablet daily as she could not tolerate an alternating day regimen with 60 and 90 mg tablets. - we discussed about taking the thyroid hormone every day, with water, >30 minutes before breakfast, separated by >4 hours from acid reflux medications, calcium, iron, multivitamins. Pt. is taking it correctly. - will check thyroid tests today: TSH, free T3, and free T4 - If labs are abnormal, she will need to return for repeat TFTs in 1.5 months  3. And 4. Vitamin D deficiency and low vitamin B12 -Reviewed latest B12 and vitamin D levels and they were normal at last visit. -Continue supplementation with 1000 mcg vitamin B12 every 3 days and 5000 units vitamin D daily -We will recheck the levels today  Component      Latest Ref Rng & Units 07/17/2018  TSH     0.35 - 4.50 uIU/mL 0.81  T4,Free(Direct)     0.60 - 1.60 ng/dL 0.68  Triiodothyronine,Free,Serum     2.3 - 4.2 pg/mL 4.1  VITD     30.00 - 100.00 ng/mL 31.38  Vitamin B12     211 - 911 pg/mL 246   Thyroid tests are normal on the current dose of Armour.  Vitamin D and vitamin B12 are at the lower limit of normal.  For now, I would continue the current dose of vitamin D but I would suggest to take the vitamin B12 daily.  Philemon Kingdom, MD PhD [facility]

## 2018-07-19 ENCOUNTER — Telehealth: Payer: Self-pay | Admitting: Hematology & Oncology

## 2018-07-19 NOTE — Telephone Encounter (Signed)
MEDICAL RECORDS REQUEST   REQUEST FAXED TO: [employer] [address] [address]  PHONE # [phone] FAX # [phone]  REQUEST INDEXED

## 2018-07-29 MED FILL — ARMOUR THYROID 60 MG TABLET: 60 | 90 days supply | Qty: 90 | Fill #2

## 2018-07-29 MED FILL — ARMOUR THYROID 15 MG TABLET: 15 | 90 days supply | Qty: 90 | Fill #1

## 2018-08-01 ENCOUNTER — Inpatient Hospital Stay: Payer: 59

## 2018-08-01 ENCOUNTER — Inpatient Hospital Stay: Payer: 59 | Attending: Hematology & Oncology | Admitting: Hematology & Oncology

## 2018-08-01 ENCOUNTER — Encounter: Payer: Self-pay | Admitting: Hematology & Oncology

## 2018-08-01 ENCOUNTER — Other Ambulatory Visit: Payer: Self-pay

## 2018-08-01 ENCOUNTER — Other Ambulatory Visit (HOSPITAL_BASED_OUTPATIENT_CLINIC_OR_DEPARTMENT_OTHER): Payer: Self-pay | Admitting: Family Medicine

## 2018-08-01 VITALS — BP 111/72 | HR 82 | Temp 98.2°F | Resp 20 | Wt 223.1 lb

## 2018-08-01 DIAGNOSIS — D473 Essential (hemorrhagic) thrombocythemia: Secondary | ICD-10-CM | POA: Insufficient documentation

## 2018-08-01 DIAGNOSIS — Z79899 Other long term (current) drug therapy: Secondary | ICD-10-CM | POA: Insufficient documentation

## 2018-08-01 DIAGNOSIS — D75839 Thrombocytosis, unspecified: Secondary | ICD-10-CM

## 2018-08-01 DIAGNOSIS — Z1231 Encounter for screening mammogram for malignant neoplasm of breast: Secondary | ICD-10-CM

## 2018-08-01 DIAGNOSIS — N2889 Other specified disorders of kidney and ureter: Secondary | ICD-10-CM | POA: Insufficient documentation

## 2018-08-01 DIAGNOSIS — D509 Iron deficiency anemia, unspecified: Secondary | ICD-10-CM | POA: Diagnosis not present

## 2018-08-01 DIAGNOSIS — K909 Intestinal malabsorption, unspecified: Secondary | ICD-10-CM

## 2018-08-01 LAB — CBC WITH DIFFERENTIAL (CANCER CENTER ONLY)
Abs Immature Granulocytes: 0.03 10*3/uL (ref 0.00–0.07)
Basophils Absolute: 0 10*3/uL (ref 0.0–0.1)
Basophils Relative: 0 %
Eosinophils Absolute: 0.1 10*3/uL (ref 0.0–0.5)
Eosinophils Relative: 1 %
HCT: 43 % (ref 36.0–46.0)
Hemoglobin: 13.3 g/dL (ref 12.0–15.0)
Immature Granulocytes: 0 %
LYMPHS PCT: 28 %
Lymphs Abs: 2.4 10*3/uL (ref 0.7–4.0)
MCH: 26.5 pg (ref 26.0–34.0)
MCHC: 30.9 g/dL (ref 30.0–36.0)
MCV: 85.7 fL (ref 80.0–100.0)
Monocytes Absolute: 0.7 10*3/uL (ref 0.1–1.0)
Monocytes Relative: 8 %
NEUTROS ABS: 5.2 10*3/uL (ref 1.7–7.7)
Neutrophils Relative %: 63 %
Platelet Count: 582 10*3/uL — ABNORMAL HIGH (ref 150–400)
RBC: 5.02 MIL/uL (ref 3.87–5.11)
RDW: 14.7 % (ref 11.5–15.5)
WBC Count: 8.4 10*3/uL (ref 4.0–10.5)
nRBC: 0 % (ref 0.0–0.2)

## 2018-08-01 LAB — CMP (CANCER CENTER ONLY)
ALT: 12 U/L (ref 0–44)
AST: 14 U/L — ABNORMAL LOW (ref 15–41)
Albumin: 4.2 g/dL (ref 3.5–5.0)
Alkaline Phosphatase: 58 U/L (ref 38–126)
Anion gap: 7 (ref 5–15)
BUN: 12 mg/dL (ref 8–23)
CO2: 31 mmol/L (ref 22–32)
Calcium: 9.7 mg/dL (ref 8.9–10.3)
Chloride: 102 mmol/L (ref 98–111)
Creatinine: 0.72 mg/dL (ref 0.44–1.00)
GFR, Est AFR Am: >60 mL/min (ref >60)
GFR, Estimated: >60 mL/min (ref >60)
GLUCOSE: 101 mg/dL — AB (ref 70–99)
Potassium: 4.7 mmol/L (ref 3.5–5.1)
Sodium: 140 mmol/L (ref 135–145)
Total Bilirubin: 0.3 mg/dL (ref 0.3–1.2)
Total Protein: 7.5 g/dL (ref 6.5–8.1)

## 2018-08-01 LAB — IRON AND TIBC
Iron: 50 ug/dL (ref 41–142)
Saturation Ratios: 15 % — ABNORMAL LOW (ref 21–57)
TIBC: 338 ug/dL (ref 236–444)
UIBC: 288 ug/dL (ref 120–384)

## 2018-08-01 LAB — FERRITIN: FERRITIN: 102 ng/mL (ref 11–307)

## 2018-08-01 NOTE — Progress Notes (Addendum)
Hematology and Oncology Follow Up Visit  Karen Wall [id number] May 15, 1956 63 y.o. 08/01/2018   Principle Diagnosis:   Essential Thrombocythemia  Iron deficiency  Current Therapy:   IV iron as indicated  EC ASA 81 mg po q day     Interim History:  Karen Wall is back for follow-up.  We last saw her back in September.  We did going give her some iron at that time.  Her iron studies at the time showed a ferritin of 102 with an iron saturation of 17%.  Unfortunately, her platelet count is higher today.  Her platelet count 582,000.  Her genetic markers so far have all been negative.  I will send off the MPL 515 and will see if this is positive.  I told her to start taking baby aspirin.  I told her to take the baby aspirin in the morning with food.  She is working without difficulty.  She is having no problems with bleeding.  She says that she has pain in the hands and feet.  I looked at her hands and feet and did not see any erythema.  She has had no headache.  There has been no issues with nausea or vomiting.  She is had no rashes.  There is been no leg swelling.  Overall, her performance status is ECOG 0.    Medications:  Current Outpatient Medications:  .  acetaminophen (TYLENOL) 500 MG tablet, Take 500 mg by mouth every 6 (six) hours as needed for pain., Disp: , Rfl:  .  amLODipine-benazepril (LOTREL) 5-20 MG capsule, Take 1 capsule by mouth daily., Disp: 30 capsule, Rfl: 3 .  buPROPion (WELLBUTRIN SR) 150 MG 12 hr tablet, Take 1 tablet (150 mg total) by mouth 2 (two) times daily., Disp: 60 tablet, Rfl: 6 .  Cholecalciferol (VITAMIN D3) 5000 units CAPS, Take 1 capsule by mouth daily., Disp: , Rfl:  .  Cyanocobalamin (VITAMIN B-12 PO), Take by mouth daily. , Disp: , Rfl:  .  IBUPROFEN PO, Take 200 mg by mouth as needed. , Disp: , Rfl:  .  LORazepam (ATIVAN) 1 MG tablet, Take 1/2 or 1 tablet at bedtime as needed for insomnia, Disp: 30 tablet, Rfl: 1 .  thyroid (ARMOUR  THYROID) 15 MG tablet, Take 1 tablet (15 mg total) by mouth daily. Along with the 60 mg tablet., Disp: 90 tablet, Rfl: 3 .  thyroid (ARMOUR THYROID) 60 MG tablet, Take every day along with the 15 mg tablet., Disp: 90 tablet, Rfl: 3  Allergies:  Allergies  Allergen Reactions  . Penicillins     unknown    Past Medical History, Surgical history, Social history, and Family History were reviewed and updated.  Review of Systems: Review of Systems  Constitutional: Negative.   HENT:  Negative.   Eyes: Negative.   Respiratory: Negative.   Cardiovascular: Negative.   Gastrointestinal: Negative.   Endocrine: Negative.   Genitourinary: Negative.    Musculoskeletal: Positive for arthralgias and myalgias.  Skin: Negative.   Neurological: Negative.   Hematological: Negative.   Psychiatric/Behavioral: Negative.     Physical Exam:  weight is 223 lb 1.9 oz (101.2 kg). Her oral temperature is 98.2 F (36.8 C). Her blood pressure is 111/72 and her pulse is 82. Her respiration is 20 and oxygen saturation is 96%.   Wt Readings from Last 3 Encounters:  08/01/18 223 lb 1.9 oz (101.2 kg)  07/17/18 224 lb (101.6 kg)  05/12/18 225 lb (102.1 kg)  Physical Exam Vitals signs reviewed.  HENT:     Head: Normocephalic and atraumatic.  Eyes:     Pupils: Pupils are equal, round, and reactive to light.  Neck:     Musculoskeletal: Normal range of motion.  Cardiovascular:     Rate and Rhythm: Normal rate and regular rhythm.     Heart sounds: Normal heart sounds.  Pulmonary:     Effort: Pulmonary effort is normal. Respiratory distress:       Breath sounds: Normal breath sounds.  Abdominal:     General: Bowel sounds are normal.     Palpations: Abdomen is soft.  Musculoskeletal: Normal range of motion.        General: No tenderness or deformity.  Lymphadenopathy:     Cervical: No cervical adenopathy.  Skin:    General: Skin is warm and dry.     Findings: No erythema or rash.  Neurological:       Mental Status: She is alert and oriented to person, place, and time.  Psychiatric:        Behavior: Behavior normal.        Thought Content: Thought content normal.        Judgment: Judgment normal.      Lab Results  Component Value Date   WBC 8.4 08/01/2018   HGB 13.3 08/01/2018   HCT 43.0 08/01/2018   MCV 85.7 08/01/2018   PLT 582 (H) 08/01/2018     Chemistry      Component Value Date/Time   NA 140 08/01/2018 1003   K 4.7 08/01/2018 1003   CL 102 08/01/2018 1003   CO2 31 08/01/2018 1003   BUN 12 08/01/2018 1003   CREATININE 0.72 08/01/2018 1003   CREATININE 0.74 05/18/2015 1356      Component Value Date/Time   CALCIUM 9.7 08/01/2018 1003   ALKPHOS 58 08/01/2018 1003   AST 14 (L) 08/01/2018 1003   ALT 12 08/01/2018 1003   BILITOT 0.3 08/01/2018 1003       Impression and Plan: Karen Wall is a 63 year old white female.  Again, I have to believe that she will end of having essential thrombocythemia.  I want to get an ultrasound of her abdomen.  I would like to see if she has any splenomegaly.  We will get this next week.  I am trying to hold off on a bone marrow biopsy on her if possible.  I would like to try to be as less invasive as possible.  I did talk to her about a bone marrow biopsy.  She knows what this is.  I explained why we might need 1.  If we do not see that her platelet count improves, then I will put her on Hydrea.  I have her come back in 4 weeks and we will see how her platelet count looks.   [name], MD 1/17/202011:34 AM    ADDENDUMMorley Wall, we did a ultrasound of her abdomen on 08/06/2018.  This showed no splenomegaly.  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  I am absolutely stunned by this.  I suspect this probably is the reason for the thrombocytosis.  The tumor measures 9.5 x 8.1 x 7.7 cm.  I spoke to Karen Wall on the phone on the afternoon of 08/06/2018.  I explained the finding  on the MRI.  We will have to work her up so that we can get her to surgery  for a nephrectomy.  We will need an MRI of the abdomen and pelvis, and also MRI of the brain.  She will need a CT scan of the chest.  We will also see about a bone scan.  Obviously, Karen Wall was very surprised by this.  She has been totally asymptomatic with respect to her urine or with any fever or abdominal pain.  Our goal clearly is to move through this evaluation as quickly as possible.  Karen Wall is very thankful that I called her and for ordering an ultrasound that found this issue.   Lattie Haw, MD

## 2018-08-05 ENCOUNTER — Ambulatory Visit (HOSPITAL_BASED_OUTPATIENT_CLINIC_OR_DEPARTMENT_OTHER)
Admission: RE | Admit: 2018-08-05 | Discharge: 2018-08-05 | Disposition: A | Discharge status: Home / Self Care | Payer: 59 | Source: Ambulatory Visit | Attending: Family Medicine | Admitting: Family Medicine

## 2018-08-05 ENCOUNTER — Other Ambulatory Visit: Payer: Self-pay | Admitting: Hematology & Oncology

## 2018-08-05 DIAGNOSIS — Z1231 Encounter for screening mammogram for malignant neoplasm of breast: Secondary | ICD-10-CM | POA: Diagnosis not present

## 2018-08-05 MED FILL — AMLODIPINE-BENAZEPRIL 5-20: 5-20 | 30 days supply | Qty: 30 | Fill #0

## 2018-08-06 ENCOUNTER — Ambulatory Visit (HOSPITAL_BASED_OUTPATIENT_CLINIC_OR_DEPARTMENT_OTHER)
Admission: RE | Admit: 2018-08-06 | Discharge: 2018-08-06 | Disposition: A | Discharge status: Home / Self Care | Payer: 59 | Source: Ambulatory Visit | Attending: Hematology & Oncology | Admitting: Hematology & Oncology

## 2018-08-06 DIAGNOSIS — D473 Essential (hemorrhagic) thrombocythemia: Secondary | ICD-10-CM | POA: Insufficient documentation

## 2018-08-06 DIAGNOSIS — N289 Disorder of kidney and ureter, unspecified: Secondary | ICD-10-CM | POA: Diagnosis not present

## 2018-08-06 DIAGNOSIS — D75839 Thrombocytosis, unspecified: Secondary | ICD-10-CM

## 2018-08-06 NOTE — Addendum Note (Signed)
Addended by: [name] on: 08/06/2018 05:14 PM   Modules accepted: Orders

## 2018-08-07 ENCOUNTER — Telehealth: Payer: Self-pay | Admitting: Hematology & Oncology

## 2018-08-07 NOTE — Telephone Encounter (Signed)
Called and LMVM for patient regarding appointment date/time/location for Bone Scan. I advised her to call Central Scheduling should she have any questions/concerns regarding this appointment.    Destiny from [facility] will contact patient to schedule MR & CT appointments

## 2018-08-09 ENCOUNTER — Ambulatory Visit (HOSPITAL_BASED_OUTPATIENT_CLINIC_OR_DEPARTMENT_OTHER)
Admission: RE | Admit: 2018-08-09 | Discharge: 2018-08-09 | Disposition: A | Discharge status: Home / Self Care | Payer: 59 | Source: Ambulatory Visit | Attending: Hematology & Oncology | Admitting: Hematology & Oncology

## 2018-08-09 ENCOUNTER — Encounter (HOSPITAL_BASED_OUTPATIENT_CLINIC_OR_DEPARTMENT_OTHER): Payer: Self-pay

## 2018-08-09 DIAGNOSIS — N2889 Other specified disorders of kidney and ureter: Secondary | ICD-10-CM

## 2018-08-09 DIAGNOSIS — C649 Malignant neoplasm of unspecified kidney, except renal pelvis: Secondary | ICD-10-CM | POA: Diagnosis not present

## 2018-08-09 DIAGNOSIS — Z8719 Personal history of other diseases of the digestive system: Secondary | ICD-10-CM | POA: Insufficient documentation

## 2018-08-09 DIAGNOSIS — K449 Diaphragmatic hernia without obstruction or gangrene: Secondary | ICD-10-CM | POA: Diagnosis not present

## 2018-08-09 HISTORY — DX: Personal history of other diseases of the digestive system: Z87.19

## 2018-08-09 MED ORDER — GADOBUTROL 1 MMOL/ML IV SOLN
10.0000 mL | Freq: Once | INTRAVENOUS | Status: AC | PRN
  Administered 2018-08-09: 10 mL via INTRAVENOUS

## 2018-08-09 MED ORDER — IOPAMIDOL (ISOVUE-300) INJECTION 61%
100.0000 mL | Freq: Once | INTRAVENOUS | Status: AC | PRN
  Administered 2018-08-09: 80 mL via INTRAVENOUS

## 2018-08-11 DIAGNOSIS — M722 Plantar fascial fibromatosis: Secondary | ICD-10-CM | POA: Diagnosis not present

## 2018-08-11 DIAGNOSIS — M79672 Pain in left foot: Secondary | ICD-10-CM | POA: Diagnosis not present

## 2018-08-11 DIAGNOSIS — M2142 Flat foot [pes planus] (acquired), left foot: Secondary | ICD-10-CM | POA: Diagnosis not present

## 2018-08-11 DIAGNOSIS — M7662 Achilles tendinitis, left leg: Secondary | ICD-10-CM | POA: Diagnosis not present

## 2018-08-11 DIAGNOSIS — M7661 Achilles tendinitis, right leg: Secondary | ICD-10-CM | POA: Diagnosis not present

## 2018-08-11 DIAGNOSIS — M79671 Pain in right foot: Secondary | ICD-10-CM | POA: Diagnosis not present

## 2018-08-12 ENCOUNTER — Inpatient Hospital Stay: Payer: 59

## 2018-08-12 ENCOUNTER — Other Ambulatory Visit: Payer: Self-pay

## 2018-08-12 DIAGNOSIS — N2889 Other specified disorders of kidney and ureter: Secondary | ICD-10-CM | POA: Diagnosis not present

## 2018-08-12 DIAGNOSIS — Z79899 Other long term (current) drug therapy: Secondary | ICD-10-CM | POA: Diagnosis not present

## 2018-08-12 DIAGNOSIS — D509 Iron deficiency anemia, unspecified: Secondary | ICD-10-CM | POA: Diagnosis not present

## 2018-08-12 DIAGNOSIS — D508 Other iron deficiency anemias: Secondary | ICD-10-CM

## 2018-08-12 DIAGNOSIS — D473 Essential (hemorrhagic) thrombocythemia: Secondary | ICD-10-CM | POA: Diagnosis not present

## 2018-08-12 MED ORDER — SODIUM CHLORIDE 0.9 % IV SOLN
Freq: Once | INTRAVENOUS | Status: AC
  Administered 2018-08-12: 13:00:00 via INTRAVENOUS
  Filled 2018-08-12: qty 250

## 2018-08-12 MED ORDER — SODIUM CHLORIDE 0.9 % IV SOLN
510.0000 mg | Freq: Once | INTRAVENOUS | Status: AC
  Administered 2018-08-12: 510 mg via INTRAVENOUS
  Filled 2018-08-12: qty 17

## 2018-08-12 NOTE — Patient Instructions (Signed)

## 2018-08-14 ENCOUNTER — Encounter: Payer: Self-pay | Admitting: Family Medicine

## 2018-08-18 ENCOUNTER — Ambulatory Visit (HOSPITAL_COMMUNITY)
Admission: RE | Admit: 2018-08-18 | Discharge: 2018-08-18 | Disposition: A | Discharge status: Home / Self Care | Payer: 59 | Source: Ambulatory Visit | Attending: Hematology & Oncology | Admitting: Hematology & Oncology

## 2018-08-18 DIAGNOSIS — C641 Malignant neoplasm of right kidney, except renal pelvis: Secondary | ICD-10-CM | POA: Diagnosis not present

## 2018-08-18 DIAGNOSIS — N2889 Other specified disorders of kidney and ureter: Secondary | ICD-10-CM | POA: Diagnosis not present

## 2018-08-18 MED ORDER — TECHNETIUM TC 99M MEDRONATE IV KIT
21.8000 | PACK | Freq: Once | INTRAVENOUS | Status: AC
  Administered 2018-08-18: 21.8 via INTRAVENOUS

## 2018-08-19 ENCOUNTER — Encounter: Payer: Self-pay | Admitting: *Deleted

## 2018-08-25 ENCOUNTER — Encounter: Payer: Self-pay | Admitting: Internal Medicine

## 2018-08-26 NOTE — Progress Notes (Addendum)
[facility] [address] [place], [place] [address] [phone]  Date:  09/01/2018   Name:  Karen Wall   DOB:  05-Feb-1956   MRN:  [id number]  PCP:  Darreld Mclean, MD    Chief Complaint: Annual Exam (pap not due)   History of Present Illness:  Karen Wall is a 63 y.o. very pleasant female patient who presents with the following:  Here today for physical.  History of hypertension, B12 deficiency, vitamin D deficiency and thyroid malabsorption, thyroid cancer status post thyroidectomy- She was also recently diagnosed with papillary renal cancer  Dr. Marin Olp happened to get an ultrasound her abdomen to evaluate for splenomegaly due to thrombocytopenia.  This showed a large tumor in the upper pole of the right kidney We are currently working on arranging further treatment for this issue She is seeing Dr. Alinda Money tomorrow to discuss surgery She is feeling quite nervous.  I shared with her my complete confidence in Dr. Alinda Money, and the fact that most people do very well with only 1 kidney They plan a nephrectomy. She stopped taking wellbutrin due to concern about her kidney- she is doing ok without it she thinks She is also avoiding NSAIDs   When I last saw her in August she was having some depression due to the decreased mobility related to foot issues, we talked about getting a mobility scooter.    Pap: 2019 Mammogram: Last month Bone density scan: will order for 2 months for now  Colon cancer: Cologuard done in 2019 Labs: Labs done in January, however she is due for a lipid update.  She is fasting today  TSH done last month  Immunizations: Tetanus appears to be due, Shingrix- delay  She had a first cousin with kidney cancer as well   She is using her ativan at bedtime and is sleeping ok  Her feet sx are about the same She would need surgery to repair her foot issue, and she does not want to do  this right now   Her father had an MI in his 6s, he died in his 80 Her mother is living but she had COPD, CHF  She did do a stress echo in 2015 which was reassuring As her that with her family history and low activity level, a stress test of some sort every few years would be prudent She does not have any chest pain.  No particular shortness of breath beyond what she attributes to deconditioning, but she is not able to do very much physical activity No PMB  Pt is a never smoker   Patient Active Problem List   Diagnosis Date Noted  . Essential hypertension 05/12/2018  . Numbness and tingling of left side of face 05/06/2018  . 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 Thrombocytosis ([place]) 11/22/2017  . Bilateral ankle pain 10/15/2017  . IDA (iron deficiency anemia) 07/23/2016  . Vitamin D deficiency 03/15/2016  . Low vitamin B12 level 03/15/2016  . Dyspnea 05/28/2014  . Chest tightness 05/28/2014  . 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 A-fib [facility])    history of  . Acquired flat foot   . Ankle pain    bilateral  . Depression   . GERD (gastroesophageal reflux disease)   . H/O hiatal hernia    "very small "  .  Hyperlipidemia   . Hypertension   . 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 Numbness    Left side of face around mouth - unknown etiology  . Numbness and tingling    left side - facial  . Palpitations    Occurred during hyperthyroid  . Thrombocytosis ([place]) 11/22/2017  . Thyroid cancer ([place])   . Thyroid disease    hyperthyroidism  . Vitamin D deficiency     Past Surgical History:  Procedure Laterality Date  . BREAST BIOPSY     age 89 - negative  . CHOLECYSTECTOMY  1992  . THYROIDECTOMY N/A 07/03/2013   Procedure: TOTAL THYROIDECTOMY;  Surgeon: Earnstine Regal, MD;  Location: [facility];  Service: General;  Laterality: N/A;    Social History   Tobacco Use  .  Smoking status: Never Smoker  . Smokeless tobacco: Never Used  Substance Use Topics  . Alcohol use: No    Alcohol/week: 0.0 standard drinks  . Drug use: No    Family History  Problem Relation Age of Onset  . Hypertension Mother   . Diabetes Mother   . Hyperlipidemia Mother   . COPD Mother   . CAD Father        MI in his 11s  . Diabetes Father   . Hyperlipidemia Father   . Hypertension Father   . Mental illness Father   . Mental illness Maternal Grandmother   . Stroke Maternal Grandmother   . Diabetes Maternal Grandmother   . Stroke Maternal Grandfather   . Heart attack Maternal Grandfather   . Congestive Heart Failure Paternal Grandmother   . Heart attack Paternal Grandfather     Allergies  Allergen Reactions  . Penicillins     unknown    Medication list has been reviewed and updated.  Current Outpatient Medications on File Prior to Visit  Medication Sig Dispense Refill  . acetaminophen (TYLENOL) 500 MG tablet Take 500 mg by mouth every 6 (six) hours as needed for pain.    Marland Kitchen amLODipine-benazepril (LOTREL) 5-20 MG capsule TAKE 1 CAPSULE BY MOUTH DAILY. 30 capsule 3  . aspirin EC 81 MG tablet Take 81 mg by mouth daily.    . Cholecalciferol (VITAMIN D3) 5000 units CAPS Take 1 capsule by mouth daily.    . Cyanocobalamin (VITAMIN B-12 PO) Take by mouth daily.     . IBUPROFEN PO Take 200 mg by mouth as needed.     Marland Kitchen LORazepam (ATIVAN) 1 MG tablet Take 1/2 or 1 tablet at bedtime as needed for insomnia 30 tablet 1  . thyroid (ARMOUR THYROID) 15 MG tablet Take 1 tablet (15 mg total) by mouth daily. Along with the 60 mg tablet. 90 tablet 3  . thyroid (ARMOUR THYROID) 60 MG tablet Take every day along with the 15 mg tablet. 90 tablet 3   No current facility-administered medications on file prior to visit.     Review of Systems:  As per HPI- otherwise negative. No fever chills, no chest pain  Physical Examination: Vitals:   09/01/18 0917  BP: 122/80  Pulse: 77  Resp:  16  Temp: 97.6 F (36.4 C)  SpO2: 98%   Vitals:   09/01/18 0917  Weight: 219 lb (99.3 kg)  Height: 5\' 2"  (1.575 m)   Body mass index is 40.06 kg/m. Ideal Body Weight: Weight in (lb) to have BMI = 25: 136.4  GEN: WDWN, NAD, Non-toxic, A & O x 3, obese, looks well HEENT: Atraumatic, Normocephalic. Neck supple. No masses,  No LAD. Bilateral TM wnl, oropharynx normal.  PEERL,EOMI.   Ears and Nose: No external deformity. CV: RRR, No M/G/R. No JVD. No thrill. No extra heart sounds. PULM: CTA B, no wheezes, crackles, rhonchi. No retractions. No resp. distress. No accessory muscle use. ABD: S, NT, ND, +BS. No rebound. No HSM. EXTR: No c/c/e NEURO Normal gait.  PSYCH: Normally interactive. Conversant. Not depressed or anxious appearing.  Calm demeanor.    Assessment and Plan: Physical exam  Screening for hyperlipidemia - Plan: Lipid panel  Immunization due - Plan: Tdap vaccine greater than or equal to 7yo IM  Screening for osteoporosis - Plan: DG Bone Density  Estrogen deficiency - Plan: DG Bone Density  Here today for a physical exam She is due for a bone density, I ordered this to be done in a few months Update her tetanus and check cholesterol today She has a likely renal cell carcinoma, and is meeting with urology tomorrow to discuss nephrectomy.  I offered her encouragement and support, we discussed keeping her blood pressure and cholesterol under good control to support her remaining kidney She would be willing to start on a cholesterol medication if indicated at this time I also advised her that I think she can go back on Wellbutrin if she feels like she needs to, she will keep me posted  Signed Lamar Blinks, MD  Received her labs, message to patient Results for orders placed or performed in visit on 09/01/18  Lipid panel  Result Value Ref Range   Cholesterol 218 (H) 0 - 200 mg/dL   Triglycerides 99.0 0.0 - 149.0 mg/dL   HDL 51.00 >39.00 mg/dL   VLDL 19.8 0.0 -  40.0 mg/dL   LDL Cholesterol 148 (H) 0 - 99 mg/dL   Total CHOL/HDL Ratio 4    NonHDL 167.44    The 10-year ASCVD risk score Mikey Bussing DC Jr., et al., 2013) is: 5.5%   Values used to calculate the score:     Age: 77 years     Sex: Female     Is Non-Hispanic African American: No     Diabetic: No     Tobacco smoker: No     Systolic Blood Pressure: 921 mmHg     Is BP treated: Yes     HDL Cholesterol: 51 mg/dL     Total Cholesterol: 218 mg/dL

## 2018-08-28 ENCOUNTER — Encounter: Payer: 59 | Admitting: Family Medicine

## 2018-09-01 ENCOUNTER — Encounter: Payer: Self-pay | Admitting: Family Medicine

## 2018-09-01 ENCOUNTER — Ambulatory Visit (INDEPENDENT_AMBULATORY_CARE_PROVIDER_SITE_OTHER): Payer: 59 | Admitting: Family Medicine

## 2018-09-01 VITALS — BP 122/80 | HR 77 | Temp 97.6°F | Resp 16 | Ht 62.0 in | Wt 219.0 lb

## 2018-09-01 DIAGNOSIS — Z13820 Encounter for screening for osteoporosis: Secondary | ICD-10-CM

## 2018-09-01 DIAGNOSIS — E2839 Other primary ovarian failure: Secondary | ICD-10-CM | POA: Diagnosis not present

## 2018-09-01 DIAGNOSIS — Z0000 Encounter for general adult medical examination without abnormal findings: Secondary | ICD-10-CM | POA: Diagnosis not present

## 2018-09-01 DIAGNOSIS — Z23 Encounter for immunization: Secondary | ICD-10-CM

## 2018-09-01 DIAGNOSIS — Z13220 Encounter for screening for lipoid disorders: Secondary | ICD-10-CM | POA: Diagnosis not present

## 2018-09-01 LAB — LIPID PANEL
Cholesterol: 218 mg/dL — ABNORMAL HIGH (ref 0–200)
HDL: 51 mg/dL (ref >39.00)
LDL Cholesterol: 148 mg/dL — ABNORMAL HIGH (ref 0–99)
NonHDL: 167.44
Total CHOL/HDL Ratio: 4
Triglycerides: 99 mg/dL (ref 0.0–149.0)
VLDL: 19.8 mg/dL (ref 0.0–40.0)

## 2018-09-01 NOTE — Patient Instructions (Addendum)
It was good to see you today, but I am sorry you are facing this issue with your kidney.  Please keep me posted and let me know if I can do anything to help We will check a cholesterol today, and I will be in touch with this report ASAP You got a tetanus booster today I would recommend that you get the shingles vaccine series, but let us wait until you get through this issue with your kidney  I also ordered a bone density scan, this can be done also at a later date  I do think you can reasonably go back on Wellbutrin if you wish to, let me know if this is something you would like to do   Health Maintenance for Postmenopausal Women Menopause is a normal process in which your reproductive ability comes to an end. This process happens gradually over a span of months to years, usually between the ages of 35 and 38. Menopause is complete when you have missed 12 consecutive menstrual periods. It is important to talk with your health care provider about some of the most common conditions that affect postmenopausal women, such as heart disease, cancer, and bone loss (osteoporosis). Adopting a healthy lifestyle and getting preventive care can help to promote your health and wellness. Those actions can also lower your chances of developing some of these common conditions. What should I know about menopause? During menopause, you may experience a number of symptoms, such as:  Moderate-to-severe hot flashes.  Night sweats.  Decrease in sex drive.  Mood swings.  Headaches.  Tiredness.  Irritability.  Memory problems.  Insomnia. Choosing to treat or not to treat menopausal changes is an individual decision that you make with your health care provider. What should I know about hormone replacement therapy and supplements? Hormone therapy products are effective for treating symptoms that are associated with menopause, such as hot flashes and night sweats. Hormone replacement carries certain risks,  especially as you become older. If you are thinking about using estrogen or estrogen with progestin treatments, discuss the benefits and risks with your health care provider. What should I know about heart disease and stroke? Heart disease, heart attack, and stroke become more likely as you age. This may be due, in part, to the hormonal changes that your body experiences during menopause. These can affect how your body processes dietary fats, triglycerides, and cholesterol. Heart attack and stroke are both medical emergencies. There are many things that you can do to help prevent heart disease and stroke:  Have your blood pressure checked at least every 1-2 years. High blood pressure causes heart disease and increases the risk of stroke.  If you are 43-38 years old, ask your health care provider if you should take aspirin to prevent a heart attack or a stroke.  Do not use any tobacco products, including cigarettes, chewing tobacco, or electronic cigarettes. If you need help quitting, ask your health care provider.  It is important to eat a healthy diet and maintain a healthy weight. ? Be sure to include plenty of vegetables, fruits, low-fat dairy products, and lean protein. ? Avoid eating foods that are high in solid fats, added sugars, or salt (sodium).  Get regular exercise. This is one of the most important things that you can do for your health. ? Try to exercise for at least 150 minutes each week. The type of exercise that you do should increase your heart rate and make you sweat. This is known as  moderate-intensity exercise. ? Try to do strengthening exercises at least twice each week. Do these in addition to the moderate-intensity exercise.  Know your numbers.Ask your health care provider to check your cholesterol and your blood glucose. Continue to have your blood tested as directed by your health care provider.  What should I know about cancer screening? There are several types of  cancer. Take the following steps to reduce your risk and to catch any cancer development as early as possible. Breast Cancer  Practice breast self-awareness. ? This means understanding how your breasts normally appear and feel. ? It also means doing regular breast self-exams. Let your health care provider know about any changes, no matter how small.  If you are 58 or older, have a clinician do a breast exam (clinical breast exam or CBE) every year. Depending on your age, family history, and medical history, it may be recommended that you also have a yearly breast X-ray (mammogram).  If you have a family history of breast cancer, talk with your health care provider about genetic screening.  If you are at high risk for breast cancer, talk with your health care provider about having an MRI and a mammogram every year.  Breast cancer (BRCA) gene test is recommended for women who have family members with BRCA-related cancers. Results of the assessment will determine the need for genetic counseling and BRCA1 and for BRCA2 testing. BRCA-related cancers include these types: ? Breast. This occurs in males or females. ? Ovarian. ? Tubal. This may also be called fallopian tube cancer. ? Cancer of the abdominal or pelvic lining (peritoneal cancer). ? Prostate. ? Pancreatic. Cervical, Uterine, and Ovarian Cancer Your health care provider may recommend that you be screened regularly for cancer of the pelvic organs. These include your ovaries, uterus, and vagina. This screening involves a pelvic exam, which includes checking for microscopic changes to the surface of your cervix (Pap test).  For women ages 21-65, health care providers may recommend a pelvic exam and a Pap test every three years. For women ages 4-65, they may recommend the Pap test and pelvic exam, combined with testing for human papilloma virus (HPV), every five years. Some types of HPV increase your risk of cervical cancer. Testing for HPV  may also be done on women of any age who have unclear Pap test results.  Other health care providers may not recommend any screening for nonpregnant women who are considered low risk for pelvic cancer and have no symptoms. Ask your health care provider if a screening pelvic exam is right for you.  If you have had past treatment for cervical cancer or a condition that could lead to cancer, you need Pap tests and screening for cancer for at least 20 years after your treatment. If Pap tests have been discontinued for you, your risk factors (such as having a new sexual partner) need to be reassessed to determine if you should start having screenings again. Some women have medical problems that increase the chance of getting cervical cancer. In these cases, your health care provider may recommend that you have screening and Pap tests more often.  If you have a family history of uterine cancer or ovarian cancer, talk with your health care provider about genetic screening.  If you have vaginal bleeding after reaching menopause, tell your health care provider.  There are currently no reliable tests available to screen for ovarian cancer. Lung Cancer Lung cancer screening is recommended for adults 34-25 years old who  are at high risk for lung cancer because of a history of smoking. A yearly low-dose CT scan of the lungs is recommended if you:  Currently smoke.  Have a history of at least 30 pack-years of smoking and you currently smoke or have quit within the past 15 years. A pack-year is smoking an average of one pack of cigarettes per day for one year. Yearly screening should:  Continue until it has been 15 years since you quit.  Stop if you develop a health problem that would prevent you from having lung cancer treatment. Colorectal Cancer  This type of cancer can be detected and can often be prevented.  Routine colorectal cancer screening usually begins at age 51 and continues through age  73.  If you have risk factors for colon cancer, your health care provider may recommend that you be screened at an earlier age.  If you have a family history of colorectal cancer, talk with your health care provider about genetic screening.  Your health care provider may also recommend using home test kits to check for hidden blood in your stool.  A small camera at the end of a tube can be used to examine your colon directly (sigmoidoscopy or colonoscopy). This is done to check for the earliest forms of colorectal cancer.  Direct examination of the colon should be repeated every 5-10 years until age 13. However, if early forms of precancerous polyps or small growths are found or if you have a family history or genetic risk for colorectal cancer, you may need to be screened more often. Skin Cancer  Check your skin from head to toe regularly.  Monitor any moles. Be sure to tell your health care provider: ? About any new moles or changes in moles, especially if there is a change in a mole's shape or color. ? If you have a mole that is larger than the size of a pencil eraser.  If any of your family members has a history of skin cancer, especially at a young age, talk with your health care provider about genetic screening.  Always use sunscreen. Apply sunscreen liberally and repeatedly throughout the day.  Whenever you are outside, protect yourself by wearing long sleeves, pants, a wide-brimmed hat, and sunglasses. What should I know about osteoporosis? Osteoporosis is a condition in which bone destruction happens more quickly than new bone creation. After menopause, you may be at an increased risk for osteoporosis. To help prevent osteoporosis or the bone fractures that can happen because of osteoporosis, the following is recommended:  If you are 34-78 years old, get at least 1,000 mg of calcium and at least 600 mg of vitamin D per day.  If you are older than age 36 but younger than age 32,  get at least 1,200 mg of calcium and at least 600 mg of vitamin D per day.  If you are older than age 66, get at least 1,200 mg of calcium and at least 800 mg of vitamin D per day. Smoking and excessive alcohol intake increase the risk of osteoporosis. Eat foods that are rich in calcium and vitamin D, and do weight-bearing exercises several times each week as directed by your health care provider. What should I know about how menopause affects my mental health? Depression may occur at any age, but it is more common as you become older. Common symptoms of depression include:  Low or sad mood.  Changes in sleep patterns.  Changes in appetite or eating  patterns.  Feeling an overall lack of motivation or enjoyment of activities that you previously enjoyed.  Frequent crying spells. Talk with your health care provider if you think that you are experiencing depression. What should I know about immunizations? It is important that you get and maintain your immunizations. These include:  Tetanus, diphtheria, and pertussis (Tdap) booster vaccine.  Influenza every year before the flu season begins.  Pneumonia vaccine.  Shingles vaccine. Your health care provider may also recommend other immunizations. This information is not intended to replace advice given to you by your health care provider. Make sure you discuss any questions you have with your health care provider. Document Released: 08/24/2005 Document Revised: 01/20/2016 Document Reviewed: 04/05/2015 Elsevier Interactive Patient Education  2019 [employer].

## 2018-09-02 DIAGNOSIS — D49511 Neoplasm of unspecified behavior of right kidney: Secondary | ICD-10-CM | POA: Diagnosis not present

## 2018-09-08 ENCOUNTER — Inpatient Hospital Stay: Payer: 59 | Attending: Hematology & Oncology | Admitting: Hematology & Oncology

## 2018-09-08 ENCOUNTER — Inpatient Hospital Stay: Payer: 59

## 2018-09-08 ENCOUNTER — Encounter: Payer: Self-pay | Admitting: Family Medicine

## 2018-09-08 ENCOUNTER — Encounter: Payer: Self-pay | Admitting: Hematology & Oncology

## 2018-09-08 ENCOUNTER — Other Ambulatory Visit: Payer: Self-pay

## 2018-09-08 VITALS — BP 110/68 | HR 77 | Temp 98.0°F | Resp 17 | Wt 198.0 lb

## 2018-09-08 DIAGNOSIS — C641 Malignant neoplasm of right kidney, except renal pelvis: Secondary | ICD-10-CM

## 2018-09-08 DIAGNOSIS — D75839 Thrombocytosis, unspecified: Secondary | ICD-10-CM

## 2018-09-08 DIAGNOSIS — E538 Deficiency of other specified B group vitamins: Secondary | ICD-10-CM | POA: Diagnosis not present

## 2018-09-08 DIAGNOSIS — N2889 Other specified disorders of kidney and ureter: Secondary | ICD-10-CM | POA: Diagnosis not present

## 2018-09-08 DIAGNOSIS — Z79899 Other long term (current) drug therapy: Secondary | ICD-10-CM | POA: Diagnosis not present

## 2018-09-08 DIAGNOSIS — D473 Essential (hemorrhagic) thrombocythemia: Secondary | ICD-10-CM | POA: Diagnosis not present

## 2018-09-08 DIAGNOSIS — E559 Vitamin D deficiency, unspecified: Secondary | ICD-10-CM | POA: Diagnosis not present

## 2018-09-08 HISTORY — DX: Malignant neoplasm of right kidney, except renal pelvis: C64.1

## 2018-09-08 LAB — CMP (CANCER CENTER ONLY)
ALT: 13 U/L (ref 0–44)
AST: 13 U/L — ABNORMAL LOW (ref 15–41)
Albumin: 4.2 g/dL (ref 3.5–5.0)
Alkaline Phosphatase: 52 U/L (ref 38–126)
Anion gap: 9 (ref 5–15)
BUN: 16 mg/dL (ref 8–23)
CO2: 29 mmol/L (ref 22–32)
Calcium: 9.5 mg/dL (ref 8.9–10.3)
Chloride: 101 mmol/L (ref 98–111)
Creatinine: 0.81 mg/dL (ref 0.44–1.00)
GFR, Est AFR Am: >60 mL/min (ref >60)
GFR, Estimated: >60 mL/min (ref >60)
GLUCOSE: 120 mg/dL — AB (ref 70–99)
Potassium: 4.5 mmol/L (ref 3.5–5.1)
SODIUM: 139 mmol/L (ref 135–145)
Total Bilirubin: 0.2 mg/dL — ABNORMAL LOW (ref 0.3–1.2)
Total Protein: 6.9 g/dL (ref 6.5–8.1)

## 2018-09-08 LAB — CBC WITH DIFFERENTIAL (CANCER CENTER ONLY)
Abs Immature Granulocytes: 0.04 10*3/uL (ref 0.00–0.07)
BASOS ABS: 0.1 10*3/uL (ref 0.0–0.1)
Basophils Relative: 1 %
Eosinophils Absolute: 0.1 10*3/uL (ref 0.0–0.5)
Eosinophils Relative: 1 %
HCT: 43.7 % (ref 36.0–46.0)
Hemoglobin: 13.8 g/dL (ref 12.0–15.0)
Immature Granulocytes: 0 %
Lymphocytes Relative: 28 %
Lymphs Abs: 2.9 10*3/uL (ref 0.7–4.0)
MCH: 27.4 pg (ref 26.0–34.0)
MCHC: 31.6 g/dL (ref 30.0–36.0)
MCV: 86.9 fL (ref 80.0–100.0)
Monocytes Absolute: 0.9 10*3/uL (ref 0.1–1.0)
Monocytes Relative: 9 %
NEUTROS PCT: 61 %
NRBC: 0 % (ref 0.0–0.2)
Neutro Abs: 6.5 10*3/uL (ref 1.7–7.7)
PLATELETS: 504 10*3/uL — AB (ref 150–400)
RBC: 5.03 MIL/uL (ref 3.87–5.11)
RDW: 15.6 % — ABNORMAL HIGH (ref 11.5–15.5)
WBC Count: 10.5 10*3/uL (ref 4.0–10.5)

## 2018-09-08 LAB — RETICULOCYTES
Immature Retic Fract: 6.8 % (ref 2.3–15.9)
RBC.: 5.03 MIL/uL (ref 3.87–5.11)
Retic Count, Absolute: 82 10*3/uL (ref 19.0–186.0)
Retic Ct Pct: 1.6 % (ref 0.4–3.1)

## 2018-09-08 LAB — SAVE SMEAR(SSMR), FOR PROVIDER SLIDE REVIEW

## 2018-09-08 MED FILL — AMLODIPINE-BENAZEPRIL 5-20: 5-20 | 30 days supply | Qty: 30 | Fill #1

## 2018-09-08 NOTE — Progress Notes (Signed)
Hematology and Oncology Follow Up Visit  Karen Wall [id number] Dec 12, 1955 63 y.o. 09/08/2018   Principle Diagnosis:   Kidney mass in RIGHT kidney  Essential Thrombocythemia   Iron deficiency anemia  Current Therapy:    EC ASA 81 mg po q day  IV Iron as needed --last dose given on 08/06/2018     Interim History:  Karen Wall is back for follow-up.  Shockingly, we found that she actually has a bigger problem than her thrombocytosis.  When we did a ultrasound of her abdomen with the last visit, we found that she actually had a large mass in the right kidney.  The mass measured 9.5 x 8.1 x 7.7 cm.  We then got an MRI of her abdomen.  This was done on 08/09/2018.  Her actual kidney mass is 2.1 x 1.7 cm.  She has a large cyst in the upper pole the right kidney measuring 8.6 cm.  She also was noted to have a moderate to large hiatal hernia.  There is no adenopathy in the abdomen or pelvis.  She has had an MRI of the brain.  This also was negative for any brain metastasis.  She had a bone scan done.  The bone scan did not show any bone metastasis.  She has seen Dr. Alinda Money of urology.  Hopefully, he will set her up for surgery in a week or so.  As far as her thrombocytosis is concerned, this really has not been an issue.  I wonder if this kidney mass is not partly the culprit for her thrombocytosis.  She had iron studies done last time that we saw her.  Her ferritin was 102 with an iron saturation of 15%.  We did give her a dose of IV iron.  She has had no problems with bowels or bladder.  There is been no hematuria.  She is had no abdominal pain.  She has had no leg swelling.  She has had no rashes.  There is been no cough or shortness of breath.  Overall, her performance status is ECOG 0.  Medications:  Current Outpatient Medications:  .  acetaminophen (TYLENOL) 500 MG tablet, Take 500 mg by mouth every 6 (six) hours as needed for pain., Disp: , Rfl:  .   amLODipine-benazepril (LOTREL) 5-20 MG capsule, TAKE 1 CAPSULE BY MOUTH DAILY., Disp: 30 capsule, Rfl: 3 .  aspirin EC 81 MG tablet, Take 81 mg by mouth daily., Disp: , Rfl:  .  Cholecalciferol (VITAMIN D3) 5000 units CAPS, Take 1 capsule by mouth daily., Disp: , Rfl:  .  Cyanocobalamin (VITAMIN B-12 PO), Take by mouth daily. , Disp: , Rfl:  .  IBUPROFEN PO, Take 200 mg by mouth as needed. , Disp: , Rfl:  .  LORazepam (ATIVAN) 1 MG tablet, Take 1/2 or 1 tablet at bedtime as needed for insomnia, Disp: 30 tablet, Rfl: 1 .  thyroid (ARMOUR THYROID) 15 MG tablet, Take 1 tablet (15 mg total) by mouth daily. Along with the 60 mg tablet., Disp: 90 tablet, Rfl: 3 .  thyroid (ARMOUR THYROID) 60 MG tablet, Take every day along with the 15 mg tablet., Disp: 90 tablet, Rfl: 3  Allergies:  Allergies  Allergen Reactions  . Penicillins     unknown    Past Medical History, Surgical history, Social history, and Family History were reviewed and updated.  Review of Systems: Review of Systems  Constitutional: Negative.   HENT:  Negative.   Eyes: Negative.  Respiratory: Negative.   Cardiovascular: Negative.   Gastrointestinal: Negative.   Endocrine: Negative.   Genitourinary: Negative.    Musculoskeletal: Negative.   Skin: Negative.   Neurological: Negative.   Hematological: Negative.   Psychiatric/Behavioral: Negative.     Physical Exam:  weight is 198 lb (89.8 kg). Her oral temperature is 98 F (36.7 C). Her blood pressure is 110/68 and her pulse is 77. Her respiration is 17 and oxygen saturation is 98%.   Wt Readings from Last 3 Encounters:  09/08/18 198 lb (89.8 kg)  09/01/18 219 lb (99.3 kg)  08/01/18 223 lb 1.9 oz (101.2 kg)    Physical Exam Vitals signs reviewed.  HENT:     Head: Normocephalic and atraumatic.  Eyes:     Pupils: Pupils are equal, round, and reactive to light.  Neck:     Musculoskeletal: Normal range of motion.  Cardiovascular:     Rate and Rhythm: Normal rate  and regular rhythm.     Heart sounds: Normal heart sounds.  Pulmonary:     Effort: Pulmonary effort is normal.     Breath sounds: Normal breath sounds.  Abdominal:     General: Bowel sounds are normal.     Palpations: Abdomen is soft.  Musculoskeletal: Normal range of motion.        General: No tenderness or deformity.  Lymphadenopathy:     Cervical: No cervical adenopathy.  Skin:    General: Skin is warm and dry.     Findings: No erythema or rash.  Neurological:     Mental Status: She is alert and oriented to person, place, and time.  Psychiatric:        Behavior: Behavior normal.        Thought Content: Thought content normal.        Judgment: Judgment normal.      Lab Results  Component Value Date   WBC 10.5 09/08/2018   HGB 13.8 09/08/2018   HCT 43.7 09/08/2018   MCV 86.9 09/08/2018   PLT 504 (H) 09/08/2018     Chemistry      Component Value Date/Time   NA 139 09/08/2018 1148   K 4.5 09/08/2018 1148   CL 101 09/08/2018 1148   CO2 29 09/08/2018 1148   BUN 16 09/08/2018 1148   CREATININE 0.81 09/08/2018 1148   CREATININE 0.74 05/18/2015 1356      Component Value Date/Time   CALCIUM 9.5 09/08/2018 1148   ALKPHOS 52 09/08/2018 1148   AST 13 (L) 09/08/2018 1148   ALT 13 09/08/2018 1148   BILITOT 0.2 (L) 09/08/2018 1148      Impression and Plan: Karen Wall is a 63 year old white female.  She has a mass in the right kidney.  This is definitely a bigger problem right now then her thrombocytosis.  We will have to await her surgery.  Once we get the pathology back, then we will see if there is any need for adjuvant therapy.  I will plan to see her in the hospital when she has her surgery.  I told her to make sure she lets me know when her surgery is scheduled.  I would like to see her back probably a month or so after she has her surgery.  I figure this probably will be sometime in mid April.  I will plan to have her come back to see me after Easter in  April.   [name], MD 2/24/20201:23 PM

## 2018-09-09 ENCOUNTER — Other Ambulatory Visit: Payer: Self-pay | Admitting: Urology

## 2018-09-09 ENCOUNTER — Telehealth: Payer: Self-pay | Admitting: *Deleted

## 2018-09-09 LAB — IRON AND TIBC
Iron: 61 ug/dL (ref 41–142)
Saturation Ratios: 21 % (ref 21–57)
TIBC: 286 ug/dL (ref 236–444)
UIBC: 225 ug/dL (ref 120–384)

## 2018-09-09 LAB — FERRITIN: Ferritin: 339 ng/mL — ABNORMAL HIGH (ref 11–307)

## 2018-09-09 MED ORDER — LOVASTATIN 20 MG PO TABS: 20.0000 mg | ORAL_TABLET | Freq: Every day | ORAL | 3 refills | Status: DC

## 2018-09-09 NOTE — Telephone Encounter (Signed)
Received request for Medical Records from [place] [employer]; forwarded to Medical Records via email/scan/SLS  

## 2018-09-10 ENCOUNTER — Other Ambulatory Visit (HOSPITAL_COMMUNITY): Payer: Self-pay | Admitting: Urology

## 2018-09-10 ENCOUNTER — Other Ambulatory Visit: Payer: Self-pay | Admitting: Urology

## 2018-09-10 ENCOUNTER — Telehealth: Payer: Self-pay | Admitting: Hematology & Oncology

## 2018-09-10 DIAGNOSIS — D3001 Benign neoplasm of right kidney: Secondary | ICD-10-CM

## 2018-09-10 NOTE — Telephone Encounter (Signed)
Faxed medical records to: [facility] F: [phone]  Karen Wall September 14, 1955 CASE: [id number]   COPY SCANNED

## 2018-09-15 DIAGNOSIS — Z0271 Encounter for disability determination: Secondary | ICD-10-CM

## 2018-09-18 ENCOUNTER — Encounter: Payer: Self-pay | Admitting: Family Medicine

## 2018-09-18 ENCOUNTER — Encounter: Payer: Self-pay | Admitting: Hematology & Oncology

## 2018-09-18 DIAGNOSIS — F5101 Primary insomnia: Secondary | ICD-10-CM

## 2018-09-18 DIAGNOSIS — Z72820 Sleep deprivation: Secondary | ICD-10-CM

## 2018-09-19 ENCOUNTER — Encounter: Payer: Self-pay | Admitting: Family Medicine

## 2018-09-19 MED ORDER — LORAZEPAM 1 MG PO TABS: 1.0000 mg | ORAL_TABLET | Freq: Every evening | ORAL | 0 refills | Status: DC | PRN

## 2018-09-19 MED FILL — LORazepam 1 MG TABS: 1 | 30 days supply | Qty: 30 | Fill #0

## 2018-09-19 MED FILL — LOVASTATIN 20 MG TABLET: 20 | 90 days supply | Qty: 90 | Fill #0

## 2018-09-19 NOTE — Telephone Encounter (Signed)
Last seen in the office last month   06/04/2018  1   02/24/2018  Lorazepam 1 MG Tablet  30.00 30 Je Cop   [id number]   Med (5269)   1  1.00 LME  Comm Ins   [place]  02/24/2018  1   02/24/2018  Lorazepam 1 MG Tablet  30.00 30 Je Cop   [id number]   Med (5269)   0  1.00 LME  Comm Ins   [place]  11/01/2016  1   05/21/2016  Lorazepam 1 MG Tablet  30.00 30 Je Cop   [id number]   Med (5269)   1  1.00 LME  Private Pay      Ok to refill

## 2018-09-22 ENCOUNTER — Encounter (HOSPITAL_COMMUNITY): Payer: Self-pay

## 2018-09-22 NOTE — Patient Instructions (Signed)
Your procedure is scheduled on: Thursday, September 25, 2018   Surgery Time: 11:15AM- 2:45PM   Report to [place]  Entrance    Report to admitting at 9:15 AM   Call this number if you have problems the morning of surgery [phone]   Do not eat food or drink liquids :After Midnight.   Brush your teeth the morning of surgery.   Do NOT smoke after Midnight   Take these medicines the morning of surgery with A SIP OF WATER: Thyroid                               You may not have any metal on your body including hair pins, jewelry, and body piercings             Do not wear make-up, lotions, powders, perfumes/cologne, or deodorant             Do not wear nail polish.  Do not shave  48 hours prior to surgery.              Do not bring valuables to the hospital. [place].   Contacts, dentures or bridgework may not be worn into surgery.   Leave suitcase in the car. After surgery it may be brought to your room.    Special Instructions: Bring a copy of your healthcare power of attorney and living will documents         the day of surgery if you haven't scanned them in before.              Please read over the following fact sheets you were given:  [facility] - Preparing for Surgery Before surgery, you can play an important role.  Because skin is not sterile, your skin needs to be as free of germs as possible.  You can reduce the number of germs on your skin by washing with CHG (chlorahexidine gluconate) soap before surgery.  CHG is an antiseptic cleaner which kills germs and bonds with the skin to continue killing germs even after washing. Please DO NOT use if you have an allergy to CHG or antibacterial soaps.  If your skin becomes reddened/irritated stop using the CHG and inform your nurse when you arrive at Short Stay. Do not shave (including legs and underarms) for at least 48 hours prior to the first CHG shower.  You  may shave your face/neck.  Please follow these instructions carefully:  1.  Shower with CHG Soap the night before surgery and the  morning of surgery.  2.  If you choose to wash your hair, wash your hair first as usual with your normal  shampoo.  3.  After you shampoo, rinse your hair and body thoroughly to remove the shampoo.                             4.  Use CHG as you would any other liquid soap.  You can apply chg directly to the skin and wash.  Gently with a scrungie or clean washcloth.  5.  Apply the CHG Soap to your body ONLY FROM THE NECK DOWN.   Do   not use on face/ open  Wound or open sores. Avoid contact with eyes, ears mouth and   genitals (private parts).                       Wash face,  Genitals (private parts) with your normal soap.             6.  Wash thoroughly, paying special attention to the area where your    surgery  will be performed.  7.  Thoroughly rinse your body with warm water from the neck down.  8.  DO NOT shower/wash with your normal soap after using and rinsing off the CHG Soap.                9.  Pat yourself dry with a clean towel.            10.  Wear clean pajamas.            11.  Place clean sheets on your bed the night of your first shower and do not  sleep with pets. Day of Surgery : Do not apply any lotions/deodorants the morning of surgery.  Please wear clean clothes to the hospital/surgery center.  FAILURE TO FOLLOW THESE INSTRUCTIONS MAY RESULT IN THE CANCELLATION OF YOUR SURGERY  PATIENT SIGNATURE_________________________________  NURSE SIGNATURE__________________________________  ________________________________________________________________________  WHAT IS A BLOOD TRANSFUSION? Blood Transfusion Information  A transfusion is the replacement of blood or some of its parts. Blood is made up of multiple cells which provide different functions.  Red blood cells carry oxygen and are used for blood loss  replacement.  White blood cells fight against infection.  Platelets control bleeding.  Plasma helps clot blood.  Other blood products are available for specialized needs, such as hemophilia or other clotting disorders. BEFORE THE TRANSFUSION  Who gives blood for transfusions?   Healthy volunteers who are fully evaluated to make sure their blood is safe. This is blood bank blood. Transfusion therapy is the safest it has ever been in the practice of medicine. Before blood is taken from a donor, a complete history is taken to make sure that person has no history of diseases nor engages in risky social behavior (examples are intravenous drug use or sexual activity with multiple partners). The donor's travel history is screened to minimize risk of transmitting infections, such as malaria. The donated blood is tested for signs of infectious diseases, such as HIV and hepatitis. The blood is then tested to be sure it is compatible with you in order to minimize the chance of a transfusion reaction. If you or a relative donates blood, this is often done in anticipation of surgery and is not appropriate for emergency situations. It takes many days to process the donated blood. RISKS AND COMPLICATIONS Although transfusion therapy is very safe and saves many lives, the main dangers of transfusion include:   Getting an infectious disease.  Developing a transfusion reaction. This is an allergic reaction to something in the blood you were given. Every precaution is taken to prevent this. The decision to have a blood transfusion has been considered carefully by your caregiver before blood is given. Blood is not given unless the benefits outweigh the risks. AFTER THE TRANSFUSION  Right after receiving a blood transfusion, you will usually feel much better and more energetic. This is especially true if your red blood cells have gotten low (anemic). The transfusion raises the level of the red blood cells which  carry oxygen, and this usually  causes an energy increase.  The nurse administering the transfusion will monitor you carefully for complications. HOME CARE INSTRUCTIONS  No special instructions are needed after a transfusion. You may find your energy is better. Speak with your caregiver about any limitations on activity for underlying diseases you may have. SEEK MEDICAL CARE IF:   Your condition is not improving after your transfusion.  You develop redness or irritation at the intravenous (IV) site. SEEK IMMEDIATE MEDICAL CARE IF:  Any of the following symptoms occur over the next 12 hours:  Shaking chills.  You have a temperature by mouth above 102 F (38.9 C), not controlled by medicine.  Chest, back, or muscle pain.  People around you feel you are not acting correctly or are confused.  Shortness of breath or difficulty breathing.  Dizziness and fainting.  You get a rash or develop hives.  You have a decrease in urine output.  Your urine turns a dark color or changes to pink, red, or brown. Any of the following symptoms occur over the next 10 days:  You have a temperature by mouth above 102 F (38.9 C), not controlled by medicine.  Shortness of breath.  Weakness after normal activity.  The white part of the eye turns yellow (jaundice).  You have a decrease in the amount of urine or are urinating less often.  Your urine turns a dark color or changes to pink, red, or brown. Document Released: 06/29/2000 Document Revised: 09/24/2011 Document Reviewed: 02/16/2008 [facility] Patient Information 2014 [place], [place].  _______________________________________________________________________

## 2018-09-22 NOTE — Pre-Procedure Instructions (Signed)
EK 05/12/2018 in epic

## 2018-09-23 ENCOUNTER — Encounter (HOSPITAL_COMMUNITY)
Admission: RE | Admit: 2018-09-23 | Discharge: 2018-09-23 | Disposition: A | Discharge status: Home / Self Care | Payer: 59 | Source: Ambulatory Visit | Attending: Urology | Admitting: Urology

## 2018-09-23 ENCOUNTER — Other Ambulatory Visit: Payer: Self-pay

## 2018-09-23 ENCOUNTER — Encounter (HOSPITAL_COMMUNITY): Payer: Self-pay

## 2018-09-23 DIAGNOSIS — Z01812 Encounter for preprocedural laboratory examination: Secondary | ICD-10-CM

## 2018-09-23 DIAGNOSIS — N281 Cyst of kidney, acquired: Secondary | ICD-10-CM | POA: Diagnosis not present

## 2018-09-23 DIAGNOSIS — F329 Major depressive disorder, single episode, unspecified: Secondary | ICD-10-CM | POA: Diagnosis not present

## 2018-09-23 DIAGNOSIS — D49511 Neoplasm of unspecified behavior of right kidney: Secondary | ICD-10-CM | POA: Diagnosis not present

## 2018-09-23 DIAGNOSIS — D473 Essential (hemorrhagic) thrombocythemia: Secondary | ICD-10-CM | POA: Diagnosis not present

## 2018-09-23 DIAGNOSIS — E890 Postprocedural hypothyroidism: Secondary | ICD-10-CM | POA: Diagnosis not present

## 2018-09-23 DIAGNOSIS — Z79899 Other long term (current) drug therapy: Secondary | ICD-10-CM | POA: Diagnosis not present

## 2018-09-23 DIAGNOSIS — F419 Anxiety disorder, unspecified: Secondary | ICD-10-CM | POA: Diagnosis not present

## 2018-09-23 DIAGNOSIS — Z85850 Personal history of malignant neoplasm of thyroid: Secondary | ICD-10-CM | POA: Diagnosis not present

## 2018-09-23 DIAGNOSIS — I10 Essential (primary) hypertension: Secondary | ICD-10-CM | POA: Diagnosis not present

## 2018-09-23 DIAGNOSIS — K219 Gastro-esophageal reflux disease without esophagitis: Secondary | ICD-10-CM | POA: Diagnosis not present

## 2018-09-23 DIAGNOSIS — K449 Diaphragmatic hernia without obstruction or gangrene: Secondary | ICD-10-CM | POA: Diagnosis not present

## 2018-09-23 DIAGNOSIS — K5730 Diverticulosis of large intestine without perforation or abscess without bleeding: Secondary | ICD-10-CM | POA: Diagnosis not present

## 2018-09-23 DIAGNOSIS — Z8249 Family history of ischemic heart disease and other diseases of the circulatory system: Secondary | ICD-10-CM | POA: Diagnosis not present

## 2018-09-23 DIAGNOSIS — Z7982 Long term (current) use of aspirin: Secondary | ICD-10-CM | POA: Diagnosis not present

## 2018-09-23 HISTORY — DX: Tachycardia, unspecified: R00.0

## 2018-09-23 HISTORY — DX: Unspecified osteoarthritis, unspecified site: M19.90

## 2018-09-23 HISTORY — DX: Hypothyroidism, unspecified: E03.9

## 2018-09-23 HISTORY — DX: Thrombocytosis, unspecified: D75.839

## 2018-09-23 HISTORY — DX: Deficiency of other specified B group vitamins: E53.8

## 2018-09-23 HISTORY — DX: Diverticulosis of intestine, part unspecified, without perforation or abscess without bleeding: K57.90

## 2018-09-23 HISTORY — DX: Other specified disorders of kidney and ureter: N28.89

## 2018-09-23 HISTORY — DX: Essential (hemorrhagic) thrombocythemia: D47.3

## 2018-09-23 HISTORY — DX: Dyspnea, unspecified: R06.00

## 2018-09-23 HISTORY — DX: Nausea with vomiting, unspecified: R11.2

## 2018-09-23 HISTORY — DX: Iron deficiency anemia, unspecified: D50.9

## 2018-09-23 HISTORY — DX: Other specified postprocedural states: Z98.890

## 2018-09-23 LAB — BASIC METABOLIC PANEL
Anion gap: 7 (ref 5–15)
BUN: 11 mg/dL (ref 8–23)
CO2: 28 mmol/L (ref 22–32)
Calcium: 8.9 mg/dL (ref 8.9–10.3)
Chloride: 103 mmol/L (ref 98–111)
Creatinine, Ser: 0.66 mg/dL (ref 0.44–1.00)
GFR calc Af Amer: >60 mL/min (ref >60)
GFR calc non Af Amer: >60 mL/min (ref >60)
Glucose, Bld: 97 mg/dL (ref 70–99)
Potassium: 4.2 mmol/L (ref 3.5–5.1)
Sodium: 138 mmol/L (ref 135–145)

## 2018-09-23 LAB — CBC
HCT: 44 % (ref 36.0–46.0)
Hemoglobin: 13.7 g/dL (ref 12.0–15.0)
MCH: 27.3 pg (ref 26.0–34.0)
MCHC: 31.1 g/dL (ref 30.0–36.0)
MCV: 87.6 fL (ref 80.0–100.0)
Platelets: 479 10*3/uL — ABNORMAL HIGH (ref 150–400)
RBC: 5.02 MIL/uL (ref 3.87–5.11)
RDW: 15.9 % — AB (ref 11.5–15.5)
WBC: 7.3 10*3/uL (ref 4.0–10.5)
nRBC: 0.3 % — ABNORMAL HIGH (ref 0.0–0.2)

## 2018-09-23 LAB — ABO/RH: ABO/RH(D): A POS

## 2018-09-23 NOTE — Progress Notes (Signed)
For Anesthesia Review:  Hx of atrial fib with hyperthyroidism - No issues currently per patient . EKg- 05/12/18-epic  Hx of hypertension- blood pressure 121/59 at preop.

## 2018-09-24 LAB — MPL515 MUTATION

## 2018-09-24 NOTE — Anesthesia Preprocedure Evaluation (Addendum)
Anesthesia Evaluation  Patient identified by MRN, date of birth, ID band Patient awake    Reviewed: Allergy & Precautions, NPO status , Patient's Chart, lab work & pertinent test results  History of Anesthesia Complications (+) PONV  Airway Mallampati: II  TM Distance: >3 FB Neck ROM: Full    Dental no notable dental hx.    Pulmonary neg pulmonary ROS,    Pulmonary exam normal breath sounds clear to auscultation       Cardiovascular hypertension, Normal cardiovascular exam Rhythm:Regular Rate:Normal     Neuro/Psych negative neurological ROS  negative psych ROS   GI/Hepatic Neg liver ROS, GERD  ,  Endo/Other  Hypothyroidism   Renal/GU negative Renal ROS  negative genitourinary   Musculoskeletal negative musculoskeletal ROS (+)   Abdominal   Peds negative pediatric ROS (+)  Hematology negative hematology ROS (+)   Anesthesia Other Findings   Reproductive/Obstetrics negative OB ROS                            Anesthesia Physical Anesthesia Plan  ASA: III  Anesthesia Plan: General   Post-op Pain Management:    Induction: Intravenous  PONV Risk Score and Plan: 4 or greater and Scopolamine patch - Pre-op, Midazolam, Dexamethasone, Ondansetron and Treatment may vary due to age or medical condition  Airway Management Planned: Oral ETT  Additional Equipment:   Intra-op Plan:   Post-operative Plan: Extubation in OR  Informed Consent: I have reviewed the patients History and Physical, chart, labs and discussed the procedure including the risks, benefits and alternatives for the proposed anesthesia with the patient or authorized representative who has indicated his/her understanding and acceptance.     Dental advisory given  Plan Discussed with: CRNA and Surgeon  Anesthesia Plan Comments: (See PAT note 09/23/18, Konrad Felix, PA-C)       Anesthesia Quick Evaluation

## 2018-09-24 NOTE — Progress Notes (Signed)
Anesthesia Chart Review   Case:  [id number] Date/Time:  09/25/18 1044   Procedures:      XI ROBOTIC RIGHT  ASSITED PARTIAL NEPHRECTOMY (Right )     OPERATIVE ULTRASOUND (Right )   Anesthesia type:  General   Pre-op diagnosis:  RIGHT RENAL NEOPLASM   Location:  [facility] ROOM 03 / [facility]   Surgeon:  Raynelle Bring, MD      DISCUSSION: 63 yo never smoker with h/o PONV, HLD, hiatal hernia, GERD, thyroid cancer (s/p thyroidectomy 2014, reports episode of A-fib prior to surgery), post surgical hypothyroidism, depression, HTN, anemia, thrombocytosis (followed by Burney Gauze, MD), right renal neoplasm scheduled for above procedure 09/25/18 with Dr. Raynelle Bring.   Dr. Marin Olp happened to get an ultrasound her abdomen to evaluate for splenomegaly due to thrombocytopenia.  This showed a large tumor in the upper pole of the right kidney. Scheduled for above procedure.    Pt can proceed with planned procedure barring acute status change.  VS: BP (!) 121/59   Pulse 72   Temp 36.7 C (Oral)   Resp 16   Ht 5\' 3"  (1.6 m)   Wt 96.6 kg   SpO2 96%   BMI 37.73 kg/m   PROVIDERS: Copland, Gay Filler, MD is PCP last seen 09/01/18  Burney Gauze, MD with hematology/oncolgy  Philemon Kingdom, MD is Endocrinologist  LABS: Labs reviewed: Acceptable for surgery. (all labs ordered are listed, but only abnormal results are displayed)  Labs Reviewed  CBC - Abnormal; Notable for the following components:      Result Value   RDW 15.9 (*)    Platelets 479 (*)    nRBC 0.3 (*)    All other components within normal limits  BASIC METABOLIC PANEL  TYPE AND SCREEN  ABO/RH     IMAGES:   EKG: 05/12/18 Rate 101 bpm Sinus tachycardia    CV: Echo 06/07/14 Study Conclusions  - Left ventricle: The cavity size was normal. Wall thickness was normal. Systolic function was normal. The estimated ejection fraction was in the range of 60% to 65%. One view was concerning for mid inferolateral hypokinesis.  Doppler parameters are consistent with abnormal left ventricular relaxation (grade 1 diastolic dysfunction). - Aortic valve: There was no stenosis. - Mitral valve: There was trivial regurgitation. - Right ventricle: The cavity size was normal. Systolic function was normal. - Pulmonary arteries: PA peak pressure: 17 mm Hg (S). - Inferior vena cava: The vessel was normal in size. The respirophasic diameter changes were in the normal range (= 50%), consistent with normal central venous pressure.  Impressions:  - Normal LV size and systolic function, EF 95-62%. One view was concerning for mid inferolateral hypokinesis (apical 3 chamber, not seen in parasternal long view). Normal RV size and systolic function. No significant valvular abnormalities. Past Medical History:  Diagnosis Date  . A-fib [facility])    history of  . Acquired flat foot   . Ankle pain    bilateral  . Arthritis    feet ankles, and knees   . Cancer of kidney parenchyma, right ([place]) 09/08/2018  . Depression   . Diverticulosis   . Dyspnea    with exertion   . Dysrhythmia    hx of afib with hyperthroid no issues currently   . GERD (gastroesophageal reflux disease)   . H/O hiatal hernia 08/09/2018   Moderate to Large  . Hyperlipidemia   . Hypertension   . Hypothyroidism   . Iron deficiency anemia  iron deficiency last iron infusion 08/2018  . 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 Numbness    Left side of face around mouth - unknown etiology  . Numbness and tingling    left side - facial  . Palpitations    Occurred during hyperthyroid  . PONV (postoperative nausea and vomiting)   . Right renal mass 07/2018   2.1 cm solid heterogeneously enhancing mass in posterior midpole   . Sinus tachycardia by electrocardiogram 05/12/2018  . Thrombocythemia ([place])   . Thrombocytosis ([place]) 11/22/2017  . Thyroid cancer ([place])   . Thyroid disease    hyperthyroidism  . Vitamin B 12 deficiency   . Vitamin D  deficiency     Past Surgical History:  Procedure Laterality Date  . BREAST BIOPSY     age 68 - negative  . CHOLECYSTECTOMY  1992  . THYROIDECTOMY N/A 07/03/2013   Procedure: TOTAL THYROIDECTOMY;  Surgeon: Earnstine Regal, MD;  Location: [facility];  Service: General;  Laterality: N/A;    MEDICATIONS: . acetaminophen (TYLENOL) 500 MG tablet  . amLODipine-benazepril (LOTREL) 5-20 MG capsule  . aspirin EC 81 MG tablet  . Cholecalciferol (VITAMIN D3) 5000 units CAPS  . Cyanocobalamin (VITAMIN B-12 PO)  . LORazepam (ATIVAN) 1 MG tablet  . lovastatin (MEVACOR) 20 MG tablet  . thyroid (ARMOUR THYROID) 15 MG tablet  . thyroid (ARMOUR THYROID) 60 MG tablet   No current facility-administered medications for this encounter.     Maia Plan [facility] Pre-Surgical Testing [phone] 09/24/18 1:29 PM

## 2018-09-24 NOTE — H&P (Signed)
Office Visit Report     09/02/2018   --------------------------------------------------------------------------------   Karen Wall  MRN: [id number]  PRIMARY CARE:  Lamar Blinks  DOB: 08-09-1955, 63 year old Female  REFERRING:  Rudell Cobb. Marin Olp, MD  SSN:   PROVIDER:  Raynelle Bring, M.D.    LOCATION:  [facility] [phone]   --------------------------------------------------------------------------------   CC/HPI: CC: Right renal neoplasm  Physician requesting consult: Dr. Burney Gauze  Primary care physician: Dr. Lamar Blinks   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 Marin Olp. She underwent an abdominal ultrasound on 08/06/18 to evaluate her for possible splenomegaly. She did not have splenomegaly but incidentally was noted to have a 9.0 cm complex cystic mass on the right kidney. An MRI of the abdomen with and without contrast was then performed on 08/09/18 and confirmed a large 8.6 cm simple cyst off the upper pole of the right kidney with an adjacent 2.1 x 1.7 cm enhancing solid mass off the posterior interpolar right kidney suspicious for renal malignancy. A CT of the chest from the same day indicated no evidence of metastatic disease to the lungs. An MRI of the brain from 08/09/18 was also negative for metastatic disease. A bone scan on 08/18/18 was negative for osseous metastatic disease. Her most recent serum creatinine was 0.72 in January. Her most recent platelet count was 582,000 and she has been felt to be acceptable to proceed with any necessary surgery per Dr. Marin Olp without further evaluation of her thrombocytopenia at this time. She is not currently on any therapy except for ASA 81 mg. She has been cleared to stop this perioperatively per Dr. Marin Olp.   Family history of kidney cancer: None.  Family history of ESRD: None.   Imaging: MRI (08/09/18)   Side of renal neoplasm: Right  Size of renal neoplasm: 2.1 cm  Location of renal neoplasm: Posterior interpolar  Exophytic or endophytic: Partially exophytic  Renal nephrometry score: 7p   Renal artery anatomy: Single renal artery (possible with a small lower pole branch or 2nd artery)  Renal vein anatomy: Single renal vein   Contralateral renal lesions: None.  Regional lymphadenopathy: None.  Adrenal masses: None.  Renal vein/IVC involvement: No.  Metastatic disease to the abdomen: No.   Chest imaging: CT chest (08/09/18)  LFTs: Normal.   Baseline renal function: Cr 0.72, eGFR > 60 ml/min   PMH: Past medical history is significant for GERD, anxiety, depression, hypertension, hypothyroidism s/p thyroid removal for thyroid cancer, depression, and thrombocytosis.  PSH: Laparoscopic cholecystectomy.     ALLERGIES: Penicillin - Trouble Breathing    MEDICATIONS: Aspirin 81 mg tablet,chewable  Armour Thyroid  Ativan  Lotrel  Tylenol  Vitamin B 12  Vitamin D3     GU PSH: None   NON-GU PSH: Breast lumpectomy, Left Cholecystectomy (open) Thyroidectomy    GU PMH: None   NON-GU PMH: Anxiety Arthritis Depression GERD Hypercholesterolemia Hypertension Hypothyroidism    FAMILY HISTORY: copd - Mother Diabetes - Father, Mother Heart Disease - Father    Notes: 1 son   SOCIAL HISTORY: Marital Status: Divorced Preferred Language: English; Ethnicity: Not Hispanic Or Latino; Race: White Current Smoking Status: Patient has never smoked.   Tobacco Use Assessment Completed: Used Tobacco in last 30 days? Does not drink anymore.  Drinks 4+ caffeinated drinks per day.    REVIEW OF SYSTEMS:  GU Review Female:   Patient reports hard to postpone urination and get up at night to urinate. Patient denies frequent urination, burning /pain with urination, leakage of urine, stream starts and stops, trouble starting your stream, have to strain to urinate, and currently pregnant.   Gastrointestinal (Lower):   Patient denies diarrhea and constipation.  Gastrointestinal (Upper):   Patient denies nausea and vomiting.  Constitutional:   Patient reports night sweats and fatigue. Patient denies fever and weight loss.  Skin:   Patient denies skin rash/ lesion and itching.  Eyes:   Patient denies blurred vision and double vision.  Ears/ Nose/ Throat:   Patient denies sinus problems and sore throat.  Hematologic/Lymphatic:   Patient reports easy bruising. Patient denies swollen glands.  Cardiovascular:   Patient reports leg swelling. Patient denies chest pains.  Respiratory:   Patient reports shortness of breath. Patient denies cough.  Endocrine:   Patient denies excessive thirst.  Musculoskeletal:   Patient reports joint pain. Patient denies back pain.  Neurological:   Patient denies headaches and dizziness.  Psychologic:   Patient reports anxiety. Patient denies depression.   VITAL SIGNS:      09/02/2018 11:14 AM  Weight 215 lb / 97.52 kg  Height 62 in / 157.48 cm  BP 123/76 mmHg  Pulse 80 /min  BMI 39.3 kg/m   MULTI-SYSTEM PHYSICAL EXAMINATION:    Constitutional: Well-nourished. No physical deformities. Normally developed. Good grooming.  Neck: Neck symmetrical, not swollen. Normal tracheal position.  Respiratory: No labored breathing, no use of accessory muscles. Clear bilaterally.  Cardiovascular: Normal temperature, normal extremity pulses, no swelling, no varicosities. Regular rate and rhythm.  Lymphatic: No enlargement of neck, axillae, groin.  Skin: No paleness, no jaundice, no cyanosis. No lesion, no ulcer, no rash.  Neurologic / Psychiatric: Oriented to time, oriented to place, oriented to person. No depression, no anxiety, no agitation.  Gastrointestinal: Obese, well-healed laparoscopic incisions. Soft, nontender, without abdominal masses.  Eyes: Normal conjunctivae. Normal eyelids.  Ears, Nose, Mouth, and Throat: Left ear no scars, no lesions, no masses.  Right ear no scars, no lesions, no masses. Nose no scars, no lesions, no masses. Normal hearing. Normal lips.  Musculoskeletal: Normal gait and station of head and neck.     PAST DATA REVIEWED:  Source Of History:  Patient  Lab Test Review:   CMP  Urine Test Review:   Urinalysis  X-Ray Review: C.T. Chest: Reviewed Films.  MRI Abdomen: Reviewed Films.  Bone Scan: Reviewed Films.    Notes:                     CLINICAL DATA: Right renal mass. Preoperative staging.   EXAM:  MRI ABDOMEN WITHOUT AND WITH CONTRAST   TECHNIQUE:  Multiplanar multisequence MR imaging of the abdomen was performed  both before and after the administration of intravenous contrast.   CONTRAST: 10 mL Gadavist   COMPARISON: None.   FINDINGS:  Lower chest: No acute findings.   Hepatobiliary: No hepatic masses identified. Prior cholecystectomy.  No evidence of biliary ductal dilatation.   Pancreas: No mass or inflammatory changes.   Spleen: Within normal limits in size and appearance.   Adrenals/Urinary Tract: Normal appearance of both adrenal glands.  Tiny simple cyst is seen in the upper pole of the left kidney.   A large simple cyst is seen in the upper pole the right kidney  measuring 8.6 cm. 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 No  evidence of hydronephrosis. No evidence renal vein or IVC tumor  thrombus.   Stomach/Bowel: Moderate to large hiatal hernia is seen.  Diverticulosis is also seen involving the visualized portion of  colon, without signs of diverticulitis.   Vascular/Lymphatic: No pathologically enlarged lymph nodes  identified. No abdominal aortic aneurysm.   Other: None.   Musculoskeletal: No suspicious bone lesions identified.   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   No evidence of metastatic disease.   Moderate to large hiatal hernia.   Colonic diverticulosis.    Electronically Signed  By: Earle Gell M.D.  On: 08/09/2018 17:36   PROCEDURES:          Urinalysis Dipstick Dipstick Cont'd  Color: Yellow Bilirubin: Neg mg/dL  Appearance: Clear Ketones: Neg mg/dL  Specific Gravity: 1.020 Blood: Neg ery/uL  pH: 5.5 Protein: Neg mg/dL  Glucose: Neg mg/dL Urobilinogen: 0.2 mg/dL    Nitrites: Neg    Leukocyte Esterase: Neg leu/uL    ASSESSMENT:      ICD-10 Details  1 GU:   Right renal neoplasm - D49.511    PLAN:           Document Letter(s):  Created for Patient: Clinical Summary         Notes:   1. Right renal neoplasm: We reviewed her imaging findings which indicates he 2.1 cm enhancing right renal lesion concerning for possible malignancy. The patient was provided information regarding their renal mass including the relative risk of benign versus malignant pathology and the natural history of renal cell carcinoma and other possible malignancies of the kidney. The role of renal biopsy, laboratory testing, and imaging studies to further characterize renal masses and/or the presence of metastatic disease were explained. We discussed the role of active surveillance, surgical therapy with both radical nephrectomy and nephron-sparing surgery, and ablative therapy in the treatment of renal masses. In addition, we discussed our goals of providing an accurate diagnosis and oncologic control while maintaining optimal renal function as appropriate based on the size, location, and complexity of their renal mass as well as their co-morbidities.   We have discussed the risks of treatment in detail including but not limited to bleeding, infection, heart attack, stroke, death, venothromoboembolism, cancer recurrence, injury/damage to surrounding organs and structures, urine leak, the possibility of open surgical  conversion for patients undergoing minimally invasive surgery, the risk of developing chronic kidney disease and its associated implications, and the potential risk of end stage renal disease possibly necessitating dialysis.   We reviewed options including a minimally invasive nephron sparing surgical approach with a robot assisted laparoscopic partial nephrectomy versus percutaneous ablation as reasonable options and reviewed the pros and cons of each of these approaches. I did not recommend surveillance considering her age and life expectancy. She is scheduled to see Dr. Marin Olp on Monday and would like to discuss these options further with him. She will then notify me how I can be of assistance to her. My tentative plan if she proceed with surgery would be to perform a right robot assisted laparoscopic partial nephrectomy along with decortication of her upper pole renal cyst is this is likely to simply be in the way during proper exposure of her tumor.   Cc: Dr. Janett Billow Copland  Dr. Collier Salina  Ennever    * Signed by Raynelle Bring, M.D. on 09/02/18 at 5:52 PM (EST)*

## 2018-09-25 ENCOUNTER — Ambulatory Visit (HOSPITAL_COMMUNITY): Payer: 59 | Admitting: Anesthesiology

## 2018-09-25 ENCOUNTER — Ambulatory Visit (HOSPITAL_COMMUNITY)
Admission: RE | Admit: 2018-09-25 | Discharge: 2018-09-25 | Disposition: A | Discharge status: Home / Self Care | Payer: 59 | Source: Ambulatory Visit | Attending: Urology | Admitting: Urology

## 2018-09-25 ENCOUNTER — Ambulatory Visit (HOSPITAL_COMMUNITY): Payer: 59 | Admitting: Physician Assistant

## 2018-09-25 ENCOUNTER — Other Ambulatory Visit: Payer: Self-pay

## 2018-09-25 ENCOUNTER — Encounter (HOSPITAL_COMMUNITY): Payer: Self-pay | Admitting: *Deleted

## 2018-09-25 ENCOUNTER — Observation Stay (HOSPITAL_COMMUNITY)
Admission: RE | Admit: 2018-09-25 | Discharge: 2018-09-26 | Disposition: A | Discharge status: Home / Self Care | Payer: 59 | Attending: Urology | Admitting: Urology

## 2018-09-25 ENCOUNTER — Encounter (HOSPITAL_COMMUNITY)
Admission: RE | Disposition: A | Discharge status: Home / Self Care | Payer: Self-pay | Source: Home / Self Care | Attending: Urology

## 2018-09-25 DIAGNOSIS — F329 Major depressive disorder, single episode, unspecified: Secondary | ICD-10-CM | POA: Insufficient documentation

## 2018-09-25 DIAGNOSIS — K219 Gastro-esophageal reflux disease without esophagitis: Secondary | ICD-10-CM | POA: Diagnosis not present

## 2018-09-25 DIAGNOSIS — Z7982 Long term (current) use of aspirin: Secondary | ICD-10-CM | POA: Insufficient documentation

## 2018-09-25 DIAGNOSIS — D49511 Neoplasm of unspecified behavior of right kidney: Secondary | ICD-10-CM | POA: Diagnosis not present

## 2018-09-25 DIAGNOSIS — E890 Postprocedural hypothyroidism: Secondary | ICD-10-CM | POA: Insufficient documentation

## 2018-09-25 DIAGNOSIS — K5730 Diverticulosis of large intestine without perforation or abscess without bleeding: Secondary | ICD-10-CM | POA: Insufficient documentation

## 2018-09-25 DIAGNOSIS — N281 Cyst of kidney, acquired: Secondary | ICD-10-CM | POA: Insufficient documentation

## 2018-09-25 DIAGNOSIS — F419 Anxiety disorder, unspecified: Secondary | ICD-10-CM | POA: Diagnosis not present

## 2018-09-25 DIAGNOSIS — D473 Essential (hemorrhagic) thrombocythemia: Secondary | ICD-10-CM | POA: Diagnosis not present

## 2018-09-25 DIAGNOSIS — C641 Malignant neoplasm of right kidney, except renal pelvis: Secondary | ICD-10-CM | POA: Diagnosis not present

## 2018-09-25 DIAGNOSIS — I10 Essential (primary) hypertension: Secondary | ICD-10-CM | POA: Insufficient documentation

## 2018-09-25 DIAGNOSIS — K449 Diaphragmatic hernia without obstruction or gangrene: Secondary | ICD-10-CM | POA: Insufficient documentation

## 2018-09-25 DIAGNOSIS — Z85850 Personal history of malignant neoplasm of thyroid: Secondary | ICD-10-CM | POA: Insufficient documentation

## 2018-09-25 DIAGNOSIS — Z8249 Family history of ischemic heart disease and other diseases of the circulatory system: Secondary | ICD-10-CM | POA: Insufficient documentation

## 2018-09-25 DIAGNOSIS — D3001 Benign neoplasm of right kidney: Secondary | ICD-10-CM

## 2018-09-25 DIAGNOSIS — Z79899 Other long term (current) drug therapy: Secondary | ICD-10-CM | POA: Insufficient documentation

## 2018-09-25 HISTORY — PX: ROBOTIC ASSITED PARTIAL NEPHRECTOMY: SHX6087

## 2018-09-25 HISTORY — PX: OPERATIVE ULTRASOUND: SHX5996

## 2018-09-25 LAB — BASIC METABOLIC PANEL
Anion gap: 9 (ref 5–15)
BUN: 12 mg/dL (ref 8–23)
CO2: 26 mmol/L (ref 22–32)
Calcium: 8.7 mg/dL — ABNORMAL LOW (ref 8.9–10.3)
Chloride: 104 mmol/L (ref 98–111)
Creatinine, Ser: 0.82 mg/dL (ref 0.44–1.00)
GFR calc Af Amer: >60 mL/min (ref >60)
GFR calc non Af Amer: >60 mL/min (ref >60)
Glucose, Bld: 197 mg/dL — ABNORMAL HIGH (ref 70–99)
Potassium: 3.8 mmol/L (ref 3.5–5.1)
Sodium: 139 mmol/L (ref 135–145)

## 2018-09-25 LAB — HEMOGLOBIN AND HEMATOCRIT, BLOOD
HEMATOCRIT: 44.4 % (ref 36.0–46.0)
Hemoglobin: 13.3 g/dL (ref 12.0–15.0)

## 2018-09-25 LAB — TYPE AND SCREEN
ABO/RH(D): A POS
Antibody Screen: NEGATIVE

## 2018-09-25 SURGERY — NEPHRECTOMY, PARTIAL, ROBOT-ASSISTED
Anesthesia: General | Laterality: Right

## 2018-09-25 MED ORDER — LACTATED RINGERS IV SOLN
INTRAVENOUS | Status: DC | PRN
  Administered 2018-09-25 (×2): via INTRAVENOUS

## 2018-09-25 MED ORDER — THYROID 30 MG PO TABS
15.0000 mg | ORAL_TABLET | Freq: Every day | ORAL | Status: DC
  Administered 2018-09-26: 15 mg via ORAL
  Filled 2018-09-25: qty 1

## 2018-09-25 MED ORDER — BENAZEPRIL HCL 20 MG PO TABS
20.0000 mg | ORAL_TABLET | Freq: Every day | ORAL | Status: DC
  Administered 2018-09-25: 20 mg via ORAL
  Filled 2018-09-25 (×3): qty 1

## 2018-09-25 MED ORDER — DOCUSATE SODIUM 100 MG PO CAPS
100.0000 mg | ORAL_CAPSULE | Freq: Two times a day (BID) | ORAL | Status: DC
  Administered 2018-09-26: 100 mg via ORAL
  Filled 2018-09-25 (×2): qty 1

## 2018-09-25 MED ORDER — LIDOCAINE 2% (20 MG/ML) 5 ML SYRINGE
INTRAMUSCULAR | Status: AC
  Filled 2018-09-25: qty 5

## 2018-09-25 MED ORDER — PROPOFOL 10 MG/ML IV BOLUS
INTRAVENOUS | Status: AC
  Filled 2018-09-25: qty 20

## 2018-09-25 MED ORDER — VANCOMYCIN HCL IN DEXTROSE 1-5 GM/200ML-% IV SOLN
1000.0000 mg | INTRAVENOUS | Status: AC
  Administered 2018-09-25: 1000 mg via INTRAVENOUS
  Filled 2018-09-25: qty 200

## 2018-09-25 MED ORDER — SCOPOLAMINE 1 MG/3DAYS TD PT72
MEDICATED_PATCH | TRANSDERMAL | Status: AC
  Filled 2018-09-25: qty 1

## 2018-09-25 MED ORDER — SODIUM CHLORIDE (PF) 0.9 % IJ SOLN
INTRAMUSCULAR | Status: DC | PRN
  Administered 2018-09-25: 20 mL

## 2018-09-25 MED ORDER — MIDAZOLAM HCL 5 MG/5ML IJ SOLN
INTRAMUSCULAR | Status: DC | PRN
  Administered 2018-09-25: 2 mg via INTRAVENOUS

## 2018-09-25 MED ORDER — SUGAMMADEX SODIUM 200 MG/2ML IV SOLN
INTRAVENOUS | Status: DC | PRN
  Administered 2018-09-25: 200 mg via INTRAVENOUS

## 2018-09-25 MED ORDER — VANCOMYCIN HCL IN DEXTROSE 1-5 GM/200ML-% IV SOLN
1000.0000 mg | Freq: Two times a day (BID) | INTRAVENOUS | Status: AC
  Administered 2018-09-25: 1000 mg via INTRAVENOUS
  Filled 2018-09-25: qty 200

## 2018-09-25 MED ORDER — SCOPOLAMINE 1 MG/3DAYS TD PT72
MEDICATED_PATCH | TRANSDERMAL | Status: DC | PRN
  Administered 2018-09-25: 1 via TRANSDERMAL

## 2018-09-25 MED ORDER — PROPOFOL 10 MG/ML IV BOLUS
INTRAVENOUS | Status: DC | PRN
  Administered 2018-09-25: 180 mg via INTRAVENOUS

## 2018-09-25 MED ORDER — LIP MEDEX EX OINT
TOPICAL_OINTMENT | CUTANEOUS | Status: AC
  Administered 2018-09-25: 1
  Filled 2018-09-25: qty 7

## 2018-09-25 MED ORDER — FENTANYL CITRATE (PF) 100 MCG/2ML IJ SOLN
INTRAMUSCULAR | Status: AC
  Filled 2018-09-25: qty 2

## 2018-09-25 MED ORDER — ONDANSETRON HCL 4 MG/2ML IJ SOLN
4.0000 mg | INTRAMUSCULAR | Status: DC | PRN
  Administered 2018-09-25: 4 mg via INTRAVENOUS
  Filled 2018-09-25: qty 2

## 2018-09-25 MED ORDER — DIPHENHYDRAMINE HCL 12.5 MG/5ML PO ELIX: 12.5000 mg | ORAL_SOLUTION | Freq: Four times a day (QID) | ORAL | Status: DC | PRN

## 2018-09-25 MED ORDER — FENTANYL CITRATE (PF) 250 MCG/5ML IJ SOLN
INTRAMUSCULAR | Status: AC
  Filled 2018-09-25: qty 5

## 2018-09-25 MED ORDER — THYROID 60 MG PO TABS
60.0000 mg | ORAL_TABLET | Freq: Every day | ORAL | Status: DC
  Administered 2018-09-26: 60 mg via ORAL
  Filled 2018-09-25: qty 1

## 2018-09-25 MED ORDER — SUGAMMADEX SODIUM 200 MG/2ML IV SOLN
INTRAVENOUS | Status: AC
  Filled 2018-09-25: qty 2

## 2018-09-25 MED ORDER — PRAVASTATIN SODIUM 20 MG PO TABS: 20.0000 mg | ORAL_TABLET | Freq: Every day | ORAL | Status: DC

## 2018-09-25 MED ORDER — ONDANSETRON HCL 4 MG/2ML IJ SOLN
INTRAMUSCULAR | Status: AC
  Filled 2018-09-25: qty 2

## 2018-09-25 MED ORDER — ROCURONIUM BROMIDE 10 MG/ML (PF) SYRINGE
PREFILLED_SYRINGE | INTRAVENOUS | Status: AC
  Filled 2018-09-25: qty 10

## 2018-09-25 MED ORDER — STERILE WATER FOR IRRIGATION IR SOLN
Status: DC | PRN
  Administered 2018-09-25: 1000 mL

## 2018-09-25 MED ORDER — LACTATED RINGERS IV SOLN
INTRAVENOUS | Status: DC
  Administered 2018-09-25: 11:00:00 via INTRAVENOUS

## 2018-09-25 MED ORDER — DEXTROSE-NACL 5-0.45 % IV SOLN
INTRAVENOUS | Status: DC
  Administered 2018-09-25 – 2018-09-26 (×2): via INTRAVENOUS

## 2018-09-25 MED ORDER — LORAZEPAM 1 MG PO TABS: 1.0000 mg | ORAL_TABLET | Freq: Every evening | ORAL | Status: DC | PRN

## 2018-09-25 MED ORDER — ONDANSETRON HCL 4 MG/2ML IJ SOLN
INTRAMUSCULAR | Status: DC | PRN
  Administered 2018-09-25: 4 mg via INTRAVENOUS

## 2018-09-25 MED ORDER — ROCURONIUM BROMIDE 10 MG/ML (PF) SYRINGE
PREFILLED_SYRINGE | INTRAVENOUS | Status: DC | PRN
  Administered 2018-09-25: 20 mg via INTRAVENOUS
  Administered 2018-09-25: 50 mg via INTRAVENOUS
  Administered 2018-09-25: 20 mg via INTRAVENOUS

## 2018-09-25 MED ORDER — LACTATED RINGERS IR SOLN
Status: DC | PRN
  Administered 2018-09-25: 1000 mL

## 2018-09-25 MED ORDER — DEXAMETHASONE SODIUM PHOSPHATE 10 MG/ML IJ SOLN
INTRAMUSCULAR | Status: AC
  Filled 2018-09-25: qty 1

## 2018-09-25 MED ORDER — ACETAMINOPHEN 10 MG/ML IV SOLN
1000.0000 mg | Freq: Four times a day (QID) | INTRAVENOUS | Status: AC
  Administered 2018-09-25 – 2018-09-26 (×3): 1000 mg via INTRAVENOUS
  Filled 2018-09-25 (×4): qty 100

## 2018-09-25 MED ORDER — MORPHINE SULFATE (PF) 2 MG/ML IV SOLN
2.0000 mg | INTRAVENOUS | Status: DC | PRN
  Administered 2018-09-25 – 2018-09-26 (×2): 2 mg via INTRAVENOUS
  Filled 2018-09-25 (×2): qty 1

## 2018-09-25 MED ORDER — MIDAZOLAM HCL 2 MG/2ML IJ SOLN
INTRAMUSCULAR | Status: AC
  Filled 2018-09-25: qty 2

## 2018-09-25 MED ORDER — AMLODIPINE BESYLATE 5 MG PO TABS
5.0000 mg | ORAL_TABLET | Freq: Every day | ORAL | Status: DC
  Administered 2018-09-25: 5 mg via ORAL
  Filled 2018-09-25 (×3): qty 1

## 2018-09-25 MED ORDER — LIDOCAINE 2% (20 MG/ML) 5 ML SYRINGE
INTRAMUSCULAR | Status: DC | PRN
  Administered 2018-09-25: 100 mg via INTRAVENOUS

## 2018-09-25 MED ORDER — FENTANYL CITRATE (PF) 100 MCG/2ML IJ SOLN
INTRAMUSCULAR | Status: DC | PRN
  Administered 2018-09-25: 50 ug via INTRAVENOUS
  Administered 2018-09-25: 100 ug via INTRAVENOUS
  Administered 2018-09-25 (×4): 50 ug via INTRAVENOUS

## 2018-09-25 MED ORDER — BUPIVACAINE LIPOSOME 1.3 % IJ SUSP
20.0000 mL | Freq: Once | INTRAMUSCULAR | Status: AC
  Administered 2018-09-25: 20 mL
  Filled 2018-09-25: qty 20

## 2018-09-25 MED ORDER — PHENYLEPHRINE 40 MCG/ML (10ML) SYRINGE FOR IV PUSH (FOR BLOOD PRESSURE SUPPORT)
PREFILLED_SYRINGE | INTRAVENOUS | Status: DC | PRN
  Administered 2018-09-25: 80 ug via INTRAVENOUS

## 2018-09-25 MED ORDER — HYDROMORPHONE HCL 1 MG/ML IJ SOLN: 0.2500 mg | INTRAMUSCULAR | Status: DC | PRN

## 2018-09-25 MED ORDER — DEXAMETHASONE SODIUM PHOSPHATE 10 MG/ML IJ SOLN
INTRAMUSCULAR | Status: DC | PRN
  Administered 2018-09-25: 10 mg via INTRAVENOUS

## 2018-09-25 MED ORDER — TRAMADOL HCL 50 MG PO TABS: 50.0000 mg | ORAL_TABLET | Freq: Four times a day (QID) | ORAL | 0 refills | Status: DC | PRN

## 2018-09-25 MED ORDER — DIPHENHYDRAMINE HCL 50 MG/ML IJ SOLN: 12.5000 mg | Freq: Four times a day (QID) | INTRAMUSCULAR | Status: DC | PRN

## 2018-09-25 MED ORDER — SODIUM CHLORIDE (PF) 0.9 % IJ SOLN
INTRAMUSCULAR | Status: AC
  Filled 2018-09-25: qty 20

## 2018-09-25 MED ORDER — PROMETHAZINE HCL 25 MG/ML IJ SOLN: 6.2500 mg | INTRAMUSCULAR | Status: DC | PRN

## 2018-09-25 MED ORDER — AMLODIPINE BESY-BENAZEPRIL HCL 5-20 MG PO CAPS: 1.0000 | ORAL_CAPSULE | Freq: Every day | ORAL | Status: DC

## 2018-09-25 MED ORDER — PHENYLEPHRINE 40 MCG/ML (10ML) SYRINGE FOR IV PUSH (FOR BLOOD PRESSURE SUPPORT)
PREFILLED_SYRINGE | INTRAVENOUS | Status: AC
  Filled 2018-09-25: qty 10

## 2018-09-25 SURGICAL SUPPLY — 59 items
ADH SKN CLS APL DERMABOND .7 (GAUZE/BANDAGES/DRESSINGS) ×2
AGENT HMST KT MTR STRL THRMB (HEMOSTASIS) ×1
APL ESCP 34 STRL LF DISP (HEMOSTASIS) ×1
APPLICATOR SURGIFLO ENDO (HEMOSTASIS) ×1 IMPLANT
BAG SPEC RTRVL LRG 6X4 10 (ENDOMECHANICALS) ×1
CHLORAPREP W/TINT 26ML (MISCELLANEOUS) ×2 IMPLANT
CLIP VESOLOCK LG 6/CT PURPLE (CLIP) ×2 IMPLANT
CLIP VESOLOCK MED LG 6/CT (CLIP) ×6 IMPLANT
COVER SURGICAL LIGHT HANDLE (MISCELLANEOUS) ×2 IMPLANT
COVER TIP SHEARS 8 DVNC (MISCELLANEOUS) ×1 IMPLANT
COVER TIP SHEARS 8MM DA VINCI (MISCELLANEOUS) ×1
COVER WAND RF STERILE (DRAPES) IMPLANT
DECANTER SPIKE VIAL GLASS SM (MISCELLANEOUS) ×2 IMPLANT
DERMABOND ADVANCED (GAUZE/BANDAGES/DRESSINGS) ×2
DERMABOND ADVANCED .7 DNX12 (GAUZE/BANDAGES/DRESSINGS) ×2 IMPLANT
DRAIN CHANNEL 15F RND FF 3/16 (WOUND CARE) ×2 IMPLANT
DRAPE ARM DVNC X/XI (DISPOSABLE) ×4 IMPLANT
DRAPE COLUMN DVNC XI (DISPOSABLE) ×1 IMPLANT
DRAPE DA VINCI XI ARM (DISPOSABLE) ×4
DRAPE DA VINCI XI COLUMN (DISPOSABLE) ×1
DRAPE INCISE IOBAN 66X45 STRL (DRAPES) ×2 IMPLANT
DRAPE SHEET LG 3/4 BI-LAMINATE (DRAPES) ×2 IMPLANT
DRSG TEGADERM 4X4.75 (GAUZE/BANDAGES/DRESSINGS) ×1 IMPLANT
ELECT PENCIL ROCKER SW 15FT (MISCELLANEOUS) ×2 IMPLANT
ELECT REM PT RETURN 15FT ADLT (MISCELLANEOUS) ×2 IMPLANT
EVACUATOR SILICONE 100CC (DRAIN) ×2 IMPLANT
GLOVE BIO SURGEON STRL SZ 6.5 (GLOVE) ×2 IMPLANT
GLOVE BIOGEL M STRL SZ7.5 (GLOVE) ×4 IMPLANT
GOWN STRL REUS W/TWL LRG LVL3 (GOWN DISPOSABLE) ×6 IMPLANT
HEMOSTAT SURGICEL 4X8 (HEMOSTASIS) IMPLANT
IRRIG SUCT STRYKERFLOW 2 WTIP (MISCELLANEOUS) ×2
IRRIGATION SUCT STRKRFLW 2 WTP (MISCELLANEOUS) ×1 IMPLANT
KIT BASIN OR (CUSTOM PROCEDURE TRAY) ×2 IMPLANT
KIT TURNOVER KIT A (KITS) ×1 IMPLANT
MARKER SKIN DUAL TIP RULER LAB (MISCELLANEOUS) ×2 IMPLANT
POUCH SPECIMEN RETRIEVAL 10MM (ENDOMECHANICALS) ×2 IMPLANT
PROTECTOR NERVE ULNAR (MISCELLANEOUS) ×4 IMPLANT
SEAL CANN UNIV 5-8 DVNC XI (MISCELLANEOUS) ×4 IMPLANT
SEAL XI 5MM-8MM UNIVERSAL (MISCELLANEOUS) ×4
SOLUTION ELECTROLUBE (MISCELLANEOUS) ×2 IMPLANT
SURGIFLO W/THROMBIN 8M KIT (HEMOSTASIS) ×2 IMPLANT
SUT ETHILON 3 0 PS 1 (SUTURE) ×2 IMPLANT
SUT MNCRL AB 4-0 PS2 18 (SUTURE) ×4 IMPLANT
SUT V-LOC BARB 180 2/0GR6 GS22 (SUTURE) ×4
SUT VIC AB 0 CT1 27 (SUTURE) ×2
SUT VIC AB 0 CT1 27XBRD ANTBC (SUTURE) ×1 IMPLANT
SUT VIC AB 2-0 CT1 27 (SUTURE) ×2
SUT VIC AB 2-0 CT1 27XBRD (SUTURE) IMPLANT
SUT VICRYL 0 UR6 27IN ABS (SUTURE) ×1 IMPLANT
SUT VLOC BARB 180 ABS3/0GR12 (SUTURE) ×2
SUTURE V-LC BRB 180 2/0GR6GS22 (SUTURE) ×1 IMPLANT
SUTURE VLOC BRB 180 ABS3/0GR12 (SUTURE) ×1 IMPLANT
TOWEL OR 17X26 10 PK STRL BLUE (TOWEL DISPOSABLE) ×2 IMPLANT
TRAY FOLEY MTR SLVR 16FR STAT (SET/KITS/TRAYS/PACK) ×2 IMPLANT
TRAY LAPAROSCOPIC (CUSTOM PROCEDURE TRAY) ×2 IMPLANT
TROCAR 12M 150ML BLUNT (TROCAR) ×1 IMPLANT
TROCAR BLADELESS OPT 5 100 (ENDOMECHANICALS) IMPLANT
TROCAR XCEL 12X100 BLDLESS (ENDOMECHANICALS) ×2 IMPLANT
WATER STERILE IRR 1000ML POUR (IV SOLUTION) ×2 IMPLANT

## 2018-09-25 NOTE — Anesthesia Procedure Notes (Signed)
Procedure Name: Intubation Date/Time: 09/25/2018 11:23 AM Performed by: Seferino Oscar D, CRNA Pre-anesthesia Checklist: Patient identified, Emergency Drugs available, Suction available and Patient being monitored Patient Re-evaluated:Patient Re-evaluated prior to induction Oxygen Delivery Method: Circle system utilized Preoxygenation: Pre-oxygenation with 100% oxygen Induction Type: IV induction Ventilation: Mask ventilation without difficulty Laryngoscope Size: Mac and 4 Grade View: Grade I Tube type: Oral Tube size: 7.5 mm Number of attempts: 1 Airway Equipment and Method: Stylet Placement Confirmation: ETT inserted through vocal cords under direct vision,  positive ETCO2 and breath sounds checked- equal and bilateral Secured at: 21 cm Tube secured with: Tape Dental Injury: Teeth and Oropharynx as per pre-operative assessment

## 2018-09-25 NOTE — Transfer of Care (Signed)
Immediate Anesthesia Transfer of Care Note  Patient: Karen Wall  Procedure(s) Performed: XI ROBOTIC RIGHT  ASSITED PARTIAL NEPHRECTOMY (Right ) OPERATIVE ULTRASOUND (Right )  Patient Location: PACU  Anesthesia Type:General  Level of Consciousness: awake, alert , oriented and patient cooperative  Airway & Oxygen Therapy: Patient Spontanous Breathing and Patient connected to face mask oxygen  Post-op Assessment: Report given to RN and Post -op Vital signs reviewed and stable  Post vital signs: Reviewed and stable  Last Vitals:  Vitals Value Taken Time  BP 142/89 09/25/2018  2:45 PM  Temp    Pulse 87 09/25/2018  2:52 PM  Resp 13 09/25/2018  2:52 PM  SpO2 98 % 09/25/2018  2:52 PM  Vitals shown include unvalidated device data.  Last Pain:  Vitals:   09/25/18 1000  TempSrc:   PainSc: 0-No pain         Complications: No apparent anesthesia complications

## 2018-09-25 NOTE — Op Note (Signed)
Preoperative diagnosis: Right renal neoplasm  Postoperative diagnosis: Right renal neoplasm  Procedure:  1. Right robotic-assisted laparoscopic partial nephrectomy 2. Right robotic-assisted laparoscopic renal cyst decortication  Surgeon: Pryor Curia. M.D.  Assistant(s): Debbrah Alar, PA-C  An assistant was required for this surgical procedure.  The duties of the assistant included but were not limited to suctioning, passing suture, camera manipulation, retraction. This procedure would not be able to be performed without an Environmental consultant.  Resident: Dr. Basilio Cairo  Anesthesia: General  Complications: None  EBL: 100 mL  IVF:  1600 mL crystalloid  Specimens: 1. Right renal neoplasm 2. Right renal cyst  Disposition of specimens: Pathology  Intraoperative findings:       1. Warm renal ischemia time: 13 minutes  Drains: 1. # 15 Blake perinephric drain  Indication:  Karen Wall is a 63 y.o. year old patient with a right renal mass and a large upper pole right renal cyst.  After a thorough review of the management options for their renal mass, they elected to proceed with surgical treatment and the above procedure.  We have discussed the potential benefits and risks of the procedure, side effects of the proposed treatment, the likelihood of the patient achieving the goals of the procedure, and any potential problems that might occur during the procedure or recuperation. Informed consent has been obtained.   Description of procedure:  The patient was taken to the operating room and a general anesthetic was administered. The patient was given preoperative antibiotics, placed in the right modified flank position with care to pad all potential pressure points, and prepped and draped in the usual sterile fashion. Next a preoperative timeout was performed.  A site was selected on in the midline for placement of the assistant port. This was placed using a standard open  Hassan technique which allowed entry into the peritoneal cavity under direct vision and without difficulty. A 12 mm port was placed and a pneumoperitoneum established. The camera was then used to inspect the abdomen and there was no evidence of any intra-abdominal injuries or other abnormalities. The remaining abdominal ports were then placed. 8 mm robotic ports were placed in the right upper quadrant, right lower quadrant, and far right lateral abdominal wall. An 8 mm port was placed for the camera site just right of the umbilicus. All ports were placed under direct vision without difficulty. The surgical cart was then docked.   Utilizing the cautery scissors, the white line of Toldt was incised allowing the colon to be mobilized medially and the plane between the mesocolon and the anterior layer of Gerota's fascia to be developed and the kidney to be exposed.  The ureter and gonadal vein were identified inferiorly and the ureter was lifted anteriorly off the psoas muscle.  Dissection proceeded superiorly along the gonadal vein until the renal vein was identified.  The renal hilum was then carefully isolated with a combination of blunt and sharp dissection allowing the renal arterial and venous structures to be separated and isolated in preparation for renal hilar vessel clamping. There were two renal arteries and two renal veins.  The lower small renal vein was isolated and ligated with Weck clips.  Attention turned to the kidney and the perinephric fat surrounding the renal mass was removed and the kidney was mobilized sufficiently for exposure and resection of the renal mass.   Once the renal mass was properly isolated, preparations were made for resection of the tumor.  Reconstructive sutures were placed  into the abdomen for the renorrhaphy portion of the procedure.  Both of the renal arteries were then clamped with bulldog clamps.  The tumor was then excised with cold scissor dissection along with an  adequate visible gross margin of normal renal parenchyma. The tumor appeared to be excised without any gross violation of the tumor. The renal collecting system was not entered during removal of the tumor.  A running 3-0 V-lock suture was then brought through the capsule of the kidney and run along the base of the renal defect to provide hemostasis and close any entry into the renal collecting system if present. Weck clips were used to secure this suture outside the renal capsule at the proximal and distal ends. An additional hemostatic agent (Surgiflo) was then placed into the renal defect. A running 2-0 V lock suture was then used to close the capsule of the kidney using a sliding clip technique which resulted in excellent hemostasis.    The bulldog clamps were then removed from the renal hilar vessel(s).. Total warm renal ischemia time was 13 minutes. The renal tumor resection site was examined. Hemostasis appeared adequate.   The kidney was placed back into its normal anatomic position and covered with perinephric fat as needed.  A # 51 Blake drain was then brought through the lateral lower port site and positioned in the perinephric space.  It was secured to the skin with a nylon suture. The surgical cart was undocked.  The renal tumor specimen was removed intact within an endopouch retrieval bag via the upper midline port site. This incision site was closed at the fascial layer with 0-vicryl suture. All other laparoscopic/robotic ports were removed under direct vision and the pneumoperitoneum let down with inspection of the operative field performed and hemostasis again confirmed. All incision sites were then injected with local anesthetic and reapproximated at the skin level with 4-0 monocryl subcuticular closures.  Dermabond was applied to the skin.  The patient tolerated the procedure well and without complications.  The patient was able to be extubated and transferred to the recovery unit in  satisfactory condition.  Pryor Curia MD

## 2018-09-25 NOTE — Discharge Instructions (Signed)

## 2018-09-25 NOTE — Progress Notes (Signed)
Patient ID: Karen Wall, female   DOB: [birth year], 63 y.o.   MRN: [id number]  Post-op note  Subjective: The patient is doing well.  No complaints.  Objective: Vital signs in last 24 hours: Temp:  [97.4 F (36.3 C)-97.8 F (36.6 C)] 97.4 F (36.3 C) (03/12 1445) Pulse Rate:  [88-95] 88 (03/12 1500) Resp:  [14-19] 14 (03/12 1500) BP: (100-142)/(67-89) 131/67 (03/12 1500) SpO2:  [97 %-100 %] 99 % (03/12 1500) Weight:  [96.6 kg] 96.6 kg (03/12 1000)  Intake/Output from previous day: No intake/output data recorded. Intake/Output this shift: Total I/O In: 1900 [I.V.:1700; IV Piggyback:200] Out: 300 [Urine:150; Blood:150]  Physical Exam:  General: Alert and oriented. Abdomen: Soft, Nondistended. Incisions: Clean and dry.  Lab Results: Recent Labs    09/23/18 1144  HGB 13.7  HCT 44.0    Assessment/Plan: POD#0   1) Continue to monitor   Pryor Curia. MD   LOS: 0 days   Dutch Gray 09/25/2018, 3:11 PM

## 2018-09-25 NOTE — Interval H&P Note (Signed)
History and Physical Interval Note:  09/25/2018 10:34 AM  Karen Wall  has presented today for surgery, with the diagnosis of RIGHT RENAL NEOPLASM.  The various methods of treatment have been discussed with the patient and family. After consideration of risks, benefits and other options for treatment, the patient has consented to  Procedure(s): XI ROBOTIC RIGHT  ASSITED PARTIAL NEPHRECTOMY (Right) OPERATIVE ULTRASOUND (Right) as a surgical intervention.  The patient's history has been reviewed, patient examined, no change in status, stable for surgery.  I have reviewed the patient's chart and labs.  Questions were answered to the patient's satisfaction.     [employer]

## 2018-09-26 DIAGNOSIS — D49511 Neoplasm of unspecified behavior of right kidney: Secondary | ICD-10-CM | POA: Diagnosis not present

## 2018-09-26 LAB — BASIC METABOLIC PANEL
ANION GAP: 8 (ref 5–15)
BUN: 9 mg/dL (ref 8–23)
CO2: 24 mmol/L (ref 22–32)
Calcium: 8.6 mg/dL — ABNORMAL LOW (ref 8.9–10.3)
Chloride: 105 mmol/L (ref 98–111)
Creatinine, Ser: 0.7 mg/dL (ref 0.44–1.00)
GFR calc Af Amer: >60 mL/min (ref >60)
GFR calc non Af Amer: >60 mL/min (ref >60)
Glucose, Bld: 183 mg/dL — ABNORMAL HIGH (ref 70–99)
Potassium: 4 mmol/L (ref 3.5–5.1)
Sodium: 137 mmol/L (ref 135–145)

## 2018-09-26 LAB — HEMOGLOBIN AND HEMATOCRIT, BLOOD
HEMATOCRIT: 39.4 % (ref 36.0–46.0)
Hemoglobin: 12 g/dL (ref 12.0–15.0)

## 2018-09-26 LAB — CREATININE, FLUID (PLEURAL, PERITONEAL, JP DRAINAGE): CREAT FL: 0.6 mg/dL

## 2018-09-26 MED ORDER — BENAZEPRIL HCL 20 MG PO TABS: 20.0000 mg | ORAL_TABLET | ORAL | Status: DC

## 2018-09-26 MED ORDER — TRAMADOL HCL 50 MG PO TABS: 50.0000 mg | ORAL_TABLET | Freq: Four times a day (QID) | ORAL | Status: DC | PRN

## 2018-09-26 MED ORDER — ACETAMINOPHEN 500 MG PO TABS
1000.0000 mg | ORAL_TABLET | Freq: Four times a day (QID) | ORAL | Status: DC
  Administered 2018-09-26: 1000 mg via ORAL
  Filled 2018-09-26: qty 2

## 2018-09-26 MED ORDER — AMLODIPINE BESYLATE 5 MG PO TABS: 5.0000 mg | ORAL_TABLET | ORAL | Status: DC

## 2018-09-26 MED ORDER — BISACODYL 10 MG RE SUPP
10.0000 mg | Freq: Once | RECTAL | Status: AC
  Administered 2018-09-26: 10 mg via RECTAL
  Filled 2018-09-26: qty 1

## 2018-09-26 NOTE — Progress Notes (Signed)
Patient ID: CARO BRUNDIDGE, female   DOB: 08/23/1955, 63 y.o.   MRN: [id number]   Subjective/Interval: NAEON Vitals stable, afebrile Patient has minimal complaints, pain well controlled. Tolerating clear liquid diet Hgb 12.0 from 13.3, creatinine 0.70 JP 50 cc UOP excellent at 2.6L  Objective: Vital signs in last 24 hours: Temp:  [97.4 F (36.3 C)-98.7 F (37.1 C)] 98.7 F (37.1 C) (03/13 0541) Pulse Rate:  [70-95] 71 (03/13 0541) Resp:  [14-19] 18 (03/13 0541) BP: (100-142)/(60-89) 108/60 (03/13 0541) SpO2:  [94 %-100 %] 95 % (03/13 0541) Weight:  [96.6 kg] 96.6 kg (03/12 1000)  Intake/Output from previous day: 03/12 0701 - 03/13 0700 In: 4636.8 [I.V.:3936.9; IV Piggyback:699.9] Out: 2875 [Urine:2675; Drains:50; Blood:150] Intake/Output this shift: Total I/O In: 1766.2 [I.V.:1366.2; IV Piggyback:400] Out: 2555 [Urine:2525; Drains:30]  Physical Exam:  General: Alert and oriented. CV: RRR, lungs clear Abdomen: Soft, Nondistended. Soft bowel sounds Incisions: Clean and dry. JP drain with thin s/s output GU: foley catheter in place draining light yellow urine  Lab Results: Recent Labs    09/23/18 1144 09/25/18 1509 09/26/18 0339  HGB 13.7 13.3 12.0  HCT 44.0 44.4 39.4    Assessment/Plan: POD#1 s/p Right robotic partial nephrectomy  - Clears, ADAT - Medlock - Ambulation TID - Incentive spirometry - Discontinue foley catheter - Will send JP fluid for creatinine - Plan to likely keep in house through today and monitor - Expect discharge home tomorrow AM     LOS: 0 days   Fredricka Bonine 09/26/2018, 6:46 AM

## 2018-09-26 NOTE — Progress Notes (Signed)
Patient ID: Karen Wall, female   DOB: Feb 24, 1956, 63 y.o.   MRN: [id number]  1 Day Post-Op Subjective: Pt doing well.  No complaints.  Pain controlled.  Objective: Vital signs in last 24 hours: Temp:  [97.4 F (36.3 C)-98.7 F (37.1 C)] 98.7 F (37.1 C) (03/13 0541) Pulse Rate:  [70-95] 71 (03/13 0541) Resp:  [14-19] 18 (03/13 0541) BP: (100-142)/(60-89) 108/60 (03/13 0541) SpO2:  [94 %-100 %] 95 % (03/13 0541) Weight:  [96.6 kg] 96.6 kg (03/12 1000)  Intake/Output from previous day: 03/12 0701 - 03/13 0700 In: 4636.8 [I.V.:3936.9; IV Piggyback:699.9] Out: 2875 [Urine:2675; Drains:50; Blood:150] Intake/Output this shift: No intake/output data recorded.  Physical Exam:  General: Alert and oriented CV: RRR Lungs: Clear Abdomen: Soft, ND Incisions: C/D/I Ext: NT, No erythema  Lab Results: Recent Labs    09/23/18 1144 09/25/18 1509 09/26/18 0339  HGB 13.7 13.3 12.0  HCT 44.0 44.4 39.4   BMET Recent Labs    09/25/18 1509 09/26/18 0339  NA 139 137  K 3.8 4.0  CL 104 105  CO2 26 24  GLUCOSE 197* 183*  BUN 12 9  CREATININE 0.82 0.70  CALCIUM 8.7* 8.6*     Studies/Results: No results found.  Assessment/Plan: POD # 1 s/p right RAL partial nephrectomy - Ambulate, IS, DVT prophylaxis - Oral pain medication - Advance diet - Monitor renal function, drain output - D/C Foley   LOS: 0 days   Dutch Gray 09/26/2018, 7:21 AM

## 2018-09-26 NOTE — Anesthesia Postprocedure Evaluation (Signed)
Anesthesia Post Note  Patient: Karen Wall  Procedure(s) Performed: XI ROBOTIC RIGHT  ASSITED PARTIAL NEPHRECTOMY (Right ) OPERATIVE ULTRASOUND (Right )     Patient location during evaluation: PACU Anesthesia Type: General Level of consciousness: awake and alert Pain management: pain level controlled Vital Signs Assessment: post-procedure vital signs reviewed and stable Respiratory status: spontaneous breathing, nonlabored ventilation, respiratory function stable and patient connected to nasal cannula oxygen Cardiovascular status: blood pressure returned to baseline and stable Postop Assessment: no apparent nausea or vomiting Anesthetic complications: no    Last Vitals:  Vitals:   09/25/18 2026 09/26/18 0541  BP: 123/64 108/60  Pulse: 70 71  Resp: 16 18  Temp: 37.1 C 37.1 C  SpO2: 94% 95%    Last Pain:  Vitals:   09/26/18 0815  TempSrc:   PainSc: 0-No pain                 Nisreen Guise S

## 2018-09-26 NOTE — Discharge Summary (Signed)
  Date of admission: 09/25/2018  Date of discharge: 09/26/2018  Admission diagnosis: Right renal neoplasm  Discharge diagnosis: Right renal neoplasm  Secondary diagnoses: Hypertension, hypothyroidism  History and Physical: For full details, please see admission history and physical. Briefly, Karen Wall is a 63 y.o. year old patient with a right renal neoplasm concerning for malignancy.   Hospital Course: She underwent a right RAL partial nephrectomy on 09/26/18.  She tolerated this well and without complications.  Her large right renal cyst was also decorticated during her procedure.  She remained hemodynamically stable overnight.  Her renal function also remained normal.  Her drain Cr was c/w serum on POD # 1 and her drain was removed.  She was able to ambulate, tolerate her diet, and her pain was controlled.  She was discharged home the evening of POD # 1.  Laboratory values:  Recent Labs    09/25/18 1509 09/26/18 0339  HGB 13.3 12.0  HCT 44.4 39.4   Recent Labs    09/25/18 1509 09/26/18 0339  CREATININE 0.82 0.70    Disposition: Home  Discharge instruction: The patient was instructed to be ambulatory but told to refrain from heavy lifting, strenuous activity, or driving.   Discharge medications:    Medication List    STOP taking these medications   aspirin EC 81 MG tablet   VITAMIN B-12 PO   Vitamin D3 125 MCG (5000 UT) Caps     TAKE these medications   acetaminophen 500 MG tablet Commonly known as:  TYLENOL Take 500 mg by mouth every 6 (six) hours as needed for pain.   amLODipine-benazepril 5-20 MG capsule Commonly known as:  LOTREL TAKE 1 CAPSULE BY MOUTH DAILY. What changed:  when to take this   LORazepam 1 MG tablet Commonly known as:  ATIVAN Take 1 tablet (1 mg total) by mouth at bedtime as needed for anxiety or sleep.   lovastatin 20 MG tablet Commonly known as:  MEVACOR Take 1 tablet (20 mg total) by mouth at bedtime.   thyroid 15 MG  tablet Commonly known as:  Armour Thyroid Take 1 tablet (15 mg total) by mouth daily. Along with the 60 mg tablet. What changed:  when to take this   thyroid 60 MG tablet Commonly known as:  Armour Thyroid Take every day along with the 15 mg tablet. What changed:    how much to take  how to take this  when to take this   traMADol 50 MG tablet Commonly known as:  Ultram Take 1-2 tablets (50-100 mg total) by mouth every 6 (six) hours as needed for moderate pain or severe pain.       Followup:  Follow-up Information    Raynelle Bring, MD On 10/15/2018.   Specialty:  Urology Why:  at 12:45 Contact information: [place] [address] [phone]

## 2018-09-26 NOTE — Progress Notes (Signed)
Patient ambulated 360 ft. Gait steady, activity tolerated well.

## 2018-09-26 NOTE — Progress Notes (Signed)
Patient remains stable A&Ox4, ambulatory without assistance. Discharge instructions reviewed questions, concerns denied.

## 2018-09-29 ENCOUNTER — Telehealth: Payer: Self-pay | Admitting: *Deleted

## 2018-09-29 ENCOUNTER — Encounter (HOSPITAL_COMMUNITY): Payer: Self-pay | Admitting: Urology

## 2018-09-29 NOTE — Telephone Encounter (Signed)
Received request for Medical Records from [facility]; forwarded to Medical Records via email/scan/SLS  

## 2018-09-30 MED FILL — AMLODIPINE-BENAZEPRIL 5-20: 5-20 | 30 days supply | Qty: 30 | Fill #2

## 2018-10-21 ENCOUNTER — Encounter: Payer: Self-pay | Admitting: Family Medicine

## 2018-10-21 MED ORDER — AMLODIPINE BESY-BENAZEPRIL HCL 5-20 MG PO CAPS: 1.0000 | ORAL_CAPSULE | Freq: Every day | ORAL | 1 refills | Status: DC

## 2018-10-23 MED FILL — ARMOUR THYROID 60 MG TABLET: 60 | 90 days supply | Qty: 90 | Fill #3

## 2018-10-23 MED FILL — ARMOUR THYROID 15 MG TABLET: 15 | 90 days supply | Qty: 90 | Fill #2

## 2018-10-25 MED FILL — AMLODIPINE-BENAZEPRIL 5-20: 5-20 | 90 days supply | Qty: 90 | Fill #0

## 2018-10-27 ENCOUNTER — Inpatient Hospital Stay: Payer: 59 | Attending: Hematology & Oncology | Admitting: Hematology & Oncology

## 2018-10-27 ENCOUNTER — Inpatient Hospital Stay: Payer: 59

## 2018-10-27 ENCOUNTER — Telehealth: Payer: Self-pay | Admitting: Hematology & Oncology

## 2018-10-27 ENCOUNTER — Other Ambulatory Visit: Payer: Self-pay

## 2018-10-27 ENCOUNTER — Other Ambulatory Visit: Payer: Self-pay | Admitting: Hematology & Oncology

## 2018-10-27 DIAGNOSIS — D473 Essential (hemorrhagic) thrombocythemia: Secondary | ICD-10-CM | POA: Diagnosis not present

## 2018-10-27 DIAGNOSIS — Z905 Acquired absence of kidney: Secondary | ICD-10-CM

## 2018-10-27 DIAGNOSIS — Z79899 Other long term (current) drug therapy: Secondary | ICD-10-CM

## 2018-10-27 DIAGNOSIS — D75839 Thrombocytosis, unspecified: Secondary | ICD-10-CM

## 2018-10-27 DIAGNOSIS — D509 Iron deficiency anemia, unspecified: Secondary | ICD-10-CM

## 2018-10-27 DIAGNOSIS — C641 Malignant neoplasm of right kidney, except renal pelvis: Secondary | ICD-10-CM

## 2018-10-27 NOTE — Telephone Encounter (Signed)
Called and LMVM for patient regarding appointment added per 4-13 staff message

## 2018-10-27 NOTE — Progress Notes (Unsigned)
Hematology and Oncology Follow Up Visit  Karen Wall [id number] [birth year] 63 y.o. 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  She was at home.  She had her right partial nephrectomy back on 09/25/2018.  This was because of a large kidney mass that was noted when we were working up her thrombocytosis.  Thankfully, most of the mass was a cyst.  The pathology report ([id number]) showed a clear cell papillary carcinoma.  It was 2.2 cm.  All margins were negative.  It was grade 2.  There were no regional lymph nodes examined.  The cyst that was removed was a simple cyst.  She was in the hospital overnight.  I do not see any indication for adjuvant therapy for this tumor.  She has had a tough few weeks.  Her mother passed away on 22-Oct-2022.  I am sure this was tough on her.  She is eating better.  She is having no problems with hematuria.  She is having no issues with her bowels.  She has had no rashes.  She is had a little bit of a cough.  She is thinks this might be from her being intubated for the surgery.  Overall, her performance status is ECOG 1.  Medications:  Current Outpatient Medications:  .  acetaminophen (TYLENOL) 500 MG tablet, Take 500 mg by mouth every 6 (six) hours as needed for pain., Disp: , Rfl:  .  amLODipine-benazepril (LOTREL) 5-20 MG capsule, Take 1 capsule by mouth at bedtime., Disp: 90 capsule, Rfl: 1 .  LORazepam (ATIVAN) 1 MG tablet, Take 1 tablet (1 mg total) by mouth at bedtime as needed for anxiety or sleep., Disp: 30 tablet, Rfl: 0 .  lovastatin (MEVACOR) 20 MG tablet, Take 1 tablet (20 mg total) by mouth at bedtime., Disp: 90  tablet, Rfl: 3 .  thyroid (ARMOUR THYROID) 15 MG tablet, Take 1 tablet (15 mg total) by mouth daily. Along with the 60 mg tablet. (Patient taking differently: Take 15 mg by mouth daily before breakfast. Along with the 60 mg tablet.), Disp: 90 tablet, Rfl: 3 .  thyroid (ARMOUR THYROID) 60 MG tablet, Take every day along with the 15 mg tablet. (Patient taking differently: Take 60 mg by mouth daily before breakfast. Take every day along with the 15 mg tablet.), Disp: 90 tablet, Rfl: 3 .  traMADol (ULTRAM) 50 MG tablet, Take 1-2 tablets (50-100 mg total) by mouth every 6 (six) hours as needed for moderate pain or severe pain., Disp: 20 tablet, Rfl: 0  Allergies:  Allergies  Allergen Reactions  . Penicillins Shortness Of Breath    Did it involve swelling of the face/tongue/throat, SOB, or low BP? Yes Did it involve sudden or severe rash/hives, skin peeling, or any reaction on the inside of your mouth or nose? No Did you need to seek medical attention at a hospital or doctor's office? Unknown When did it last happen?3-63 years old If all above answers are "NO", may proceed with cephalosporin use.     Past Medical History, Surgical history, Social history, and Family History were reviewed and updated.  Review of Systems:  Review of Systems  Constitutional: Negative.   HENT:  Negative.   Eyes: Negative.   Respiratory: Negative.   Cardiovascular: Negative.   Gastrointestinal: Negative.   Endocrine: Negative.   Genitourinary: Negative.    Musculoskeletal: Negative.   Skin: Negative.   Neurological: Negative.   Hematological: Negative.   Psychiatric/Behavioral: Negative.     Physical Exam:  vitals were not taken for this visit.   Wt Readings from Last 3 Encounters:  09/25/18 213 lb (96.6 kg)  09/23/18 213 lb (96.6 kg)  09/08/18 198 lb (89.8 kg)    Physical Exam Vitals signs reviewed.  HENT:     Head: Normocephalic and atraumatic.  Eyes:     Pupils: Pupils are equal, round,  and reactive to light.  Neck:     Musculoskeletal: Normal range of motion.  Cardiovascular:     Rate and Rhythm: Normal rate and regular rhythm.     Heart sounds: Normal heart sounds.  Pulmonary:     Effort: Pulmonary effort is normal.     Breath sounds: Normal breath sounds.  Abdominal:     General: Bowel sounds are normal.     Palpations: Abdomen is soft.  Musculoskeletal: Normal range of motion.        General: No tenderness or deformity.  Lymphadenopathy:     Cervical: No cervical adenopathy.  Skin:    General: Skin is warm and dry.     Findings: No erythema or rash.  Neurological:     Mental Status: She is alert and oriented to person, place, and time.  Psychiatric:        Behavior: Behavior normal.        Thought Content: Thought content normal.        Judgment: Judgment normal.      Lab Results  Component Value Date   WBC 7.3 09/23/2018   HGB 12.0 09/26/2018   HCT 39.4 09/26/2018   MCV 87.6 09/23/2018   PLT 479 (H) 09/23/2018     Chemistry      Component Value Date/Time   NA 137 09/26/2018 0339   K 4.0 09/26/2018 0339   CL 105 09/26/2018 0339   CO2 24 09/26/2018 0339   BUN 9 09/26/2018 0339   CREATININE 0.70 09/26/2018 0339   CREATININE 0.81 09/08/2018 1148   CREATININE 0.74 05/18/2015 1356      Component Value Date/Time   CALCIUM 8.6 (L) 09/26/2018 0339   ALKPHOS 52 09/08/2018 1148   AST 13 (L) 09/08/2018 1148   ALT 13 09/08/2018 1148   BILITOT 0.2 (L) 09/08/2018 1148      Impression and Plan: Karen Wall is a 63 year old white female.  Thankfully, this renal mass is not going to be a problem for her.  I think that overall the chance of this renal mass coming back is going to be less then 5%.  I would like to see her back in 2 months.  It will be very interesting to see what her blood counts look like at that time.  Urology will follow up with her renal tumor.  They will do CAT scans in their office.  I have no problems with this.  They are very  thorough in their patient follow-ups.  Again, this was a Haematologist.     [name], MD 4/13/202012:28 PM

## 2018-10-28 NOTE — Progress Notes (Signed)
Hematology and Oncology Follow Up Visit  Karen Wall [id number] [birth year] 63 y.o. 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  She was at home.  She had her right partial nephrectomy back on 09/25/2018.  This was because of a large kidney mass that was noted when we were working up her thrombocytosis.  Thankfully, most of the mass was a cyst.  The pathology report ([id number]) showed a clear cell papillary carcinoma.  It was 2.2 cm.  All margins were negative.  It was grade 2.  There were no regional lymph nodes examined.  The cyst that was removed was a simple cyst.  She was in the hospital overnight.  I do not see any indication for adjuvant therapy for this tumor.  She has had a tough few weeks.  Her mother passed away on 11-08-2022.  I am sure this was tough on her.  She is eating better.  She is having no problems with hematuria.  She is having no issues with her bowels.  She has had no rashes.  She is had a little bit of a cough.  She is thinks this might be from her being intubated for the surgery.  Overall, her performance status is ECOG 1.  Medications:  Current Outpatient Medications:  .  acetaminophen (TYLENOL) 500 MG tablet, Take 500 mg by mouth every 6 (six) hours as needed for pain., Disp: , Rfl:  .  amLODipine-benazepril (LOTREL) 5-20 MG capsule, Take 1 capsule by mouth at bedtime., Disp: 90 capsule, Rfl: 1 .  LORazepam (ATIVAN) 1 MG tablet, Take 1 tablet (1 mg total) by mouth at bedtime as needed for anxiety or sleep., Disp: 30 tablet, Rfl: 0 .  lovastatin (MEVACOR) 20 MG tablet, Take 1  tablet (20 mg total) by mouth at bedtime., Disp: 90 tablet, Rfl: 3 .  thyroid (ARMOUR THYROID) 15 MG tablet, Take 1 tablet (15 mg total) by mouth daily. Along with the 60 mg tablet. (Patient taking differently: Take 15 mg by mouth daily before breakfast. Along with the 60 mg tablet.), Disp: 90 tablet, Rfl: 3 .  thyroid (ARMOUR THYROID) 60 MG tablet, Take every day along with the 15 mg tablet. (Patient taking differently: Take 60 mg by mouth daily before breakfast. Take every day along with the 15 mg tablet.), Disp: 90 tablet, Rfl: 3 .  traMADol (ULTRAM) 50 MG tablet, Take 1-2 tablets (50-100 mg total) by mouth every 6 (six) hours as needed for moderate pain or severe pain., Disp: 20 tablet, Rfl: 0  Allergies:       Allergies  Allergen Reactions  . Penicillins Shortness Of Breath    Did it involve swelling of the face/tongue/throat, SOB, or low BP? Yes Did it involve sudden or severe rash/hives, skin peeling, or any reaction on the inside of your mouth or nose? No Did you need to seek medical attention at a hospital  or doctor's office? Unknown When did it last happen?44-63 years old If all above answers are "NO", may proceed with cephalosporin use.     Past Medical History, Surgical history, Social history, and Family History were reviewed and updated.  Review of Systems: Review of Systems  Constitutional: Negative.   HENT:  Negative.   Eyes: Negative.   Respiratory: Negative.   Cardiovascular: Negative.   Gastrointestinal: Negative.   Endocrine: Negative.   Genitourinary: Negative.    Musculoskeletal: Negative.   Skin: Negative.   Neurological: Negative.   Hematological: Negative.   Psychiatric/Behavioral: Negative.     Physical Exam:  vitals were not taken for this visit.      Wt Readings from Last 3 Encounters:  09/25/18 213 lb (96.6 kg)  09/23/18 213 lb (96.6 kg)  09/08/18 198 lb (89.8 kg)    Physical Exam Vitals signs reviewed.  HENT:     Head:  Normocephalic and atraumatic.  Eyes:     Pupils: Pupils are equal, round, and reactive to light.  Neck:     Musculoskeletal: Normal range of motion.  Cardiovascular:     Rate and Rhythm: Normal rate and regular rhythm.     Heart sounds: Normal heart sounds.  Pulmonary:     Effort: Pulmonary effort is normal.     Breath sounds: Normal breath sounds.  Abdominal:     General: Bowel sounds are normal.     Palpations: Abdomen is soft.  Musculoskeletal: Normal range of motion.        General: No tenderness or deformity.  Lymphadenopathy:     Cervical: No cervical adenopathy.  Skin:    General: Skin is warm and dry.     Findings: No erythema or rash.  Neurological:     Mental Status: She is alert and oriented to person, place, and time.  Psychiatric:        Behavior: Behavior normal.        Thought Content: Thought content normal.        Judgment: Judgment normal.      RecentLabs       Lab Results  Component Value Date   WBC 7.3 09/23/2018   HGB 12.0 09/26/2018   HCT 39.4 09/26/2018   MCV 87.6 09/23/2018   PLT 479 (H) 09/23/2018       Chemistry   Labs(Brief)          Component Value Date/Time   NA 137 09/26/2018 0339   K 4.0 09/26/2018 0339   CL 105 09/26/2018 0339   CO2 24 09/26/2018 0339   BUN 9 09/26/2018 0339   CREATININE 0.70 09/26/2018 0339   CREATININE 0.81 09/08/2018 1148   CREATININE 0.74 05/18/2015 1356     Labs(Brief)          Component Value Date/Time   CALCIUM 8.6 (L) 09/26/2018 0339   ALKPHOS 52 09/08/2018 1148   AST 13 (L) 09/08/2018 1148   ALT 13 09/08/2018 1148   BILITOT 0.2 (L) 09/08/2018 1148        Impression and Plan: Karen Wall is a 63 year old white female.  Thankfully, this renal mass is not going to be a problem for her.  I think that overall the chance of this renal mass coming back is going to be less then 5%.  I would like to see her back in 2 months.  It will be very interesting to see what  her blood counts look like at that time.  Urology will follow up with  her renal tumor.  They will do CAT scans in their office.  I have no problems with this.  They are very thorough in their patient follow-ups.  Again, this was a Haematologist.     [name], MD 4/13/202012:28 PM

## 2018-11-05 DIAGNOSIS — Z0289 Encounter for other administrative examinations: Secondary | ICD-10-CM

## 2018-12-25 ENCOUNTER — Other Ambulatory Visit: Payer: Self-pay

## 2018-12-25 ENCOUNTER — Inpatient Hospital Stay: Payer: 59 | Attending: Hematology & Oncology

## 2018-12-25 ENCOUNTER — Encounter: Payer: Self-pay | Admitting: Hematology & Oncology

## 2018-12-25 ENCOUNTER — Inpatient Hospital Stay (HOSPITAL_BASED_OUTPATIENT_CLINIC_OR_DEPARTMENT_OTHER): Payer: 59 | Admitting: Hematology & Oncology

## 2018-12-25 VITALS — BP 117/77 | HR 77 | Temp 97.4°F | Resp 18 | Wt 205.0 lb

## 2018-12-25 DIAGNOSIS — D509 Iron deficiency anemia, unspecified: Secondary | ICD-10-CM | POA: Insufficient documentation

## 2018-12-25 DIAGNOSIS — D75839 Thrombocytosis, unspecified: Secondary | ICD-10-CM

## 2018-12-25 DIAGNOSIS — D473 Essential (hemorrhagic) thrombocythemia: Secondary | ICD-10-CM

## 2018-12-25 DIAGNOSIS — R7989 Other specified abnormal findings of blood chemistry: Secondary | ICD-10-CM | POA: Diagnosis not present

## 2018-12-25 DIAGNOSIS — Z85528 Personal history of other malignant neoplasm of kidney: Secondary | ICD-10-CM | POA: Insufficient documentation

## 2018-12-25 DIAGNOSIS — Z7982 Long term (current) use of aspirin: Secondary | ICD-10-CM | POA: Diagnosis not present

## 2018-12-25 DIAGNOSIS — C641 Malignant neoplasm of right kidney, except renal pelvis: Secondary | ICD-10-CM

## 2018-12-25 DIAGNOSIS — Z905 Acquired absence of kidney: Secondary | ICD-10-CM | POA: Insufficient documentation

## 2018-12-25 LAB — CBC WITH DIFFERENTIAL (CANCER CENTER ONLY)
Abs Immature Granulocytes: 0.04 10*3/uL (ref 0.00–0.07)
Basophils Absolute: 0 10*3/uL (ref 0.0–0.1)
Basophils Relative: 0 %
Eosinophils Absolute: 0.1 10*3/uL (ref 0.0–0.5)
Eosinophils Relative: 1 %
HCT: 42.1 % (ref 36.0–46.0)
Hemoglobin: 13.3 g/dL (ref 12.0–15.0)
Immature Granulocytes: 0 %
Lymphocytes Relative: 30 %
Lymphs Abs: 3 10*3/uL (ref 0.7–4.0)
MCH: 27.1 pg (ref 26.0–34.0)
MCHC: 31.6 g/dL (ref 30.0–36.0)
MCV: 85.9 fL (ref 80.0–100.0)
Monocytes Absolute: 0.8 10*3/uL (ref 0.1–1.0)
Monocytes Relative: 8 %
Neutro Abs: 6.1 10*3/uL (ref 1.7–7.7)
Neutrophils Relative %: 61 %
Platelet Count: 540 10*3/uL — ABNORMAL HIGH (ref 150–400)
RBC: 4.9 MIL/uL (ref 3.87–5.11)
RDW: 14.4 % (ref 11.5–15.5)
WBC Count: 10.1 10*3/uL (ref 4.0–10.5)
nRBC: 0 % (ref 0.0–0.2)

## 2018-12-25 LAB — CMP (CANCER CENTER ONLY)
ALT: 12 U/L (ref 0–44)
AST: 13 U/L — ABNORMAL LOW (ref 15–41)
Albumin: 4 g/dL (ref 3.5–5.0)
Alkaline Phosphatase: 56 U/L (ref 38–126)
Anion gap: 8 (ref 5–15)
BUN: 15 mg/dL (ref 8–23)
CO2: 30 mmol/L (ref 22–32)
Calcium: 9.4 mg/dL (ref 8.9–10.3)
Chloride: 100 mmol/L (ref 98–111)
Creatinine: 0.66 mg/dL (ref 0.44–1.00)
GFR, Est AFR Am: >60 mL/min (ref >60)
GFR, Estimated: >60 mL/min (ref >60)
Glucose, Bld: 103 mg/dL — ABNORMAL HIGH (ref 70–99)
Potassium: 4.6 mmol/L (ref 3.5–5.1)
Sodium: 138 mmol/L (ref 135–145)
Total Bilirubin: 0.4 mg/dL (ref 0.3–1.2)
Total Protein: 7.5 g/dL (ref 6.5–8.1)

## 2018-12-25 LAB — SAVE SMEAR(SSMR), FOR PROVIDER SLIDE REVIEW

## 2018-12-25 LAB — IRON AND TIBC
Iron: 74 ug/dL (ref 41–142)
Saturation Ratios: 24 % (ref 21–57)
TIBC: 305 ug/dL (ref 236–444)
UIBC: 231 ug/dL (ref 120–384)

## 2018-12-25 LAB — LACTATE DEHYDROGENASE: LDH: 149 U/L (ref 98–192)

## 2018-12-25 LAB — FERRITIN: Ferritin: 247 ng/mL (ref 11–307)

## 2018-12-25 NOTE — Progress Notes (Signed)
Hematology and Oncology Follow Up Visit  Karen Wall [id number] 12-09-1955 63 y.o. 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 is in for follow-up.  She is doing okay.  Is now been about 2 months since her mom passed away.  She does have a little bit of pain in the right flank area.  I think this might be from her surgery that she had for the right partial nephrectomy.  She has not had any issues going to the bathroom.there is been no hematuria.  She is had no headache.  There is been no rashes.  She has had no nausea or vomiting.  She has had no cough.    Overall, her performance status is ECOG 1.  Medications:  Current Outpatient Medications:  .  acetaminophen (TYLENOL) 500 MG tablet, Take 500 mg by mouth every 6 (six) hours as needed for pain., Disp: , Rfl:  .  amLODipine-benazepril (LOTREL) 5-20 MG capsule, Take 1 capsule by mouth at bedtime., Disp: 90 capsule, Rfl: 1 .  LORazepam (ATIVAN) 1 MG tablet, Take 1 tablet (1 mg total) by mouth at bedtime as needed for anxiety or sleep., Disp: 30 tablet, Rfl: 0 .  lovastatin (MEVACOR) 20 MG tablet, Take 1 tablet (20 mg total) by mouth at bedtime., Disp: 90 tablet, Rfl: 3 .  thyroid (ARMOUR THYROID) 15 MG tablet, Take 1 tablet (15 mg total) by mouth daily. Along with the 60 mg tablet. (Patient taking differently: Take 15 mg by mouth daily before breakfast. Along with the 60 mg tablet.), Disp: 90 tablet, Rfl: 3 .  thyroid (ARMOUR THYROID) 60 MG tablet, Take every day along with the 15 mg tablet. (Patient taking differently: Take 60 mg by mouth daily before breakfast. Take every day  along with the 15 mg tablet.), Disp: 90 tablet, Rfl: 3 .  traMADol (ULTRAM) 50 MG tablet, Take 1-2 tablets (50-100 mg total) by mouth every 6 (six) hours as needed for moderate pain or severe pain., Disp: 20 tablet, Rfl: 0  Allergies:       Allergies  Allergen Reactions  . Penicillins Shortness Of Breath    Did it involve swelling of the face/tongue/throat, SOB, or low BP? Yes Did it involve sudden or severe rash/hives, skin peeling, or any reaction on the inside of your mouth or nose? No Did you need to seek medical attention at a hospital or doctor's office? Unknown When did it last happen?27-85 years old If all above answers are "NO", may proceed with cephalosporin use.     Past Medical History, Surgical history, Social history, and Family History were reviewed and updated.  Review of Systems: Review of Systems  Constitutional: Negative.   HENT:  Negative.   Eyes: Negative.   Respiratory: Negative.   Cardiovascular: Negative.   Gastrointestinal: Negative.   Endocrine: Negative.   Genitourinary: Negative.    Musculoskeletal: Negative.   Skin: Negative.   Neurological: Negative.  Hematological: Negative.   Psychiatric/Behavioral: Negative.     Physical Exam:  vitals were not taken for this visit.      Wt Readings from Last 3 Encounters:  09/25/18 213 lb (96.6 kg)  09/23/18 213 lb (96.6 kg)  09/08/18 198 lb (89.8 kg)    Physical Exam Vitals signs reviewed.  HENT:     Head: Normocephalic and atraumatic.  Eyes:     Pupils: Pupils are equal, round, and reactive to light.  Neck:     Musculoskeletal: Normal range of motion.  Cardiovascular:     Rate and Rhythm: Normal rate and regular rhythm.     Heart sounds: Normal heart sounds.  Pulmonary:     Effort: Pulmonary effort is normal.     Breath sounds: Normal breath sounds.  Abdominal:     General: Bowel sounds are normal.     Palpations: Abdomen is soft.  Musculoskeletal: Normal range of  motion.        General: No tenderness or deformity.  Lymphadenopathy:     Cervical: No cervical adenopathy.  Skin:    General: Skin is warm and dry.     Findings: No erythema or rash.  Neurological:     Mental Status: She is alert and oriented to person, place, and time.  Psychiatric:        Behavior: Behavior normal.        Thought Content: Thought content normal.        Judgment: Judgment normal.      RecentLabs       Lab Results  Component Value Date   WBC 7.3 09/23/2018   HGB 12.0 09/26/2018   HCT 39.4 09/26/2018   MCV 87.6 09/23/2018   PLT 479 (H) 09/23/2018       Chemistry   Labs(Brief)          Component Value Date/Time   NA 137 09/26/2018 0339   K 4.0 09/26/2018 0339   CL 105 09/26/2018 0339   CO2 24 09/26/2018 0339   BUN 9 09/26/2018 0339   CREATININE 0.70 09/26/2018 0339   CREATININE 0.81 09/08/2018 1148   CREATININE 0.74 05/18/2015 1356     Labs(Brief)          Component Value Date/Time   CALCIUM 8.6 (L) 09/26/2018 0339   ALKPHOS 52 09/08/2018 1148   AST 13 (L) 09/08/2018 1148   ALT 13 09/08/2018 1148   BILITOT 0.2 (L) 09/08/2018 1148        Impression and Plan: [name] is a 63 year old white female.  Thankfully, this renal mass is not going to be a problem for her.  I think that overall the chance of this renal mass coming back is going to be less then 5%.  My concern now is her platelet count.  Her platelet count is going up.  I suspect that she probably does have essential thrombocythemia.  We have done molecular markers.  She is negative for any of the mutations, Jak 2, Calreticulin, and MPL 515.  If her platelet count continues to elevate, we may have to do a bone marrow test on her.  I may have to consider Hydrea for her if her platelet count goes up.  She was taken off baby aspirin with her surgery.  I told her to go back on the baby aspirin.  I would like to see her back in 6 weeks.   [name], MD 6/11/202012:28 PM

## 2019-01-05 DIAGNOSIS — M7661 Achilles tendinitis, right leg: Secondary | ICD-10-CM | POA: Diagnosis not present

## 2019-01-05 DIAGNOSIS — M7662 Achilles tendinitis, left leg: Secondary | ICD-10-CM | POA: Diagnosis not present

## 2019-01-05 DIAGNOSIS — M2142 Flat foot [pes planus] (acquired), left foot: Secondary | ICD-10-CM | POA: Diagnosis not present

## 2019-01-05 DIAGNOSIS — M722 Plantar fascial fibromatosis: Secondary | ICD-10-CM | POA: Diagnosis not present

## 2019-01-08 ENCOUNTER — Other Ambulatory Visit: Payer: Self-pay

## 2019-01-12 ENCOUNTER — Encounter: Payer: Self-pay | Admitting: Internal Medicine

## 2019-01-12 ENCOUNTER — Other Ambulatory Visit: Payer: Self-pay

## 2019-01-12 ENCOUNTER — Ambulatory Visit (INDEPENDENT_AMBULATORY_CARE_PROVIDER_SITE_OTHER): Payer: 59 | Admitting: Internal Medicine

## 2019-01-12 VITALS — BP 118/70 | HR 100 | Ht 62.0 in | Wt 208.0 lb

## 2019-01-12 DIAGNOSIS — C73 Malignant neoplasm of thyroid gland: Secondary | ICD-10-CM | POA: Diagnosis not present

## 2019-01-12 DIAGNOSIS — E538 Deficiency of other specified B group vitamins: Secondary | ICD-10-CM

## 2019-01-12 DIAGNOSIS — E559 Vitamin D deficiency, unspecified: Secondary | ICD-10-CM

## 2019-01-12 DIAGNOSIS — E890 Postprocedural hypothyroidism: Secondary | ICD-10-CM | POA: Diagnosis not present

## 2019-01-12 NOTE — Progress Notes (Signed)
Patient ID: Karen Wall, female   DOB: February 24, 1956, 63 y.o.   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 Last visit 6 months ago.  Since last visit, she was diagnosed with stage I kidney cancer and she had robotic right nephrectomy.  Reviewed history: In 03/2013, she developed fevers >> TSH checked >> hyperthyroidism (TMNG) >> started MMI >> continued for 1 month.   She also had a thyroid U/S in 03/2013 >> 2 thyroid nodules >> FNA of each in 04/2013 was negative. However, she opted for thyroidectomy for the TMNG.  She had total thyroidectomy 06/2013>> one focus of follicular variant of PTC of 0.8 cm : Specimen: Thyroid Procedure: Thyroidectomy Specimen Integrity (intact/fragmented): Intact Tumor focality: Unifocal Dominant tumor: Right lobe Maximum tumor size (cm): 0.8 cm Tumor laterality: Right Histologic type (including subtype and/or unique features as applicable): Follicular variant papillary thyroid carcinoma Tumor capsule: N/A Extrathyroidal extension: No Margins: Free of tumor Lymph - Vascular invasion: No Capsular invasion with degree of invasion if present: N/A Lymph nodes: # examined 0; # positive; N/A TNM code: pT1a, pNX Non-neoplastic thyroid: Multinodular goiter. Comments: There is a 0.8 cm nodule of papillary thyroid carcinoma, follicular variant in the right superior lobe which does not involve the margin and is confined within the right lobe. The remainder of the thyroid shows features of multinodular goiter.  Dr Loanne Drilling advised against RAI Tx and against following Thyroglobulin. She then sought a second opinion with me >> I I recommended the same.  Neck U/S (04/02/2014): No evidence of abnormal soft tissue in the surgical thyroid bed. Nonenlarged cervical lymph nodes identified.  Neck U/S (03/29/2016):  normal post  thyroidectomy appearance.  No suspicious lymphadenopathy  Pt denies: - feeling nodules in neck - hoarseness - dysphagia - choking - SOB with lying down  Post-surgical hypothyroidism:  Reviewed history: Started on 100 mcg Synthroid  - brand name after sx >> TSH 0.074 >> dose decrease to 50 mcg >> pt felt terrible >> TSH 14.6 >> dose increased to 75 mcg, but TSH returned above goal, at 2.76 >> we increased to 88 mcg daily. However, We added T3 to help with fatigue (Armour) 02/2017 >> then 60 and 90 mg every other day.   However, after the increase in dose >> rash, palpitations, panic attacks, SOB but feeling more energetic and less depressed >> I suggested to change to Naturethroid 81.25 mg. This was on back order >> then Levothyroxine 100 mcg + Liothyronine 5 mcg daily.  She does not feel too well on this combination.  She is still very tired, crashing around 3 PM, and feels that she gained weight.  She did not feel well on the 90 alternating with 60 mg qod >> emotional fluctuation.   Pt is on Armour 60+15 mg daily, taken: - in am - fasting - at least 30 min from b'fast - no Ca, Fe, MVI, PPIs - not on Biotin  TFTs reviewed: Lab Results  Component Value Date   TSH 0.81 07/17/2018   TSH 2.05 05/12/2018   TSH 9.69 (H) 03/10/2018   TSH 0.06 (L) 01/14/2018   TSH 0.45 09/03/2017   TSH 3.76 07/17/2017   TSH 3.42 06/03/2017   TSH 9.64 (H) 04/24/2017   TSH 3.23 03/15/2017   TSH 1.89 03/15/2016   FREET4 0.68 07/17/2018   FREET4 0.57 (L) 05/12/2018  FREET4 0.44 (L) 03/10/2018   FREET4 1.18 01/14/2018   FREET4 0.97 09/03/2017   FREET4 0.58 (L) 07/17/2017   FREET4 0.52 (L) 06/03/2017   FREET4 0.52 (L) 04/24/2017   FREET4 0.78 03/15/2017   FREET4 1.07 03/15/2016   Latest vitamin D was low normal: Lab Results  Component Value Date   VD25OH 31.38 07/17/2018   VD25OH 31.42 01/14/2018   VD25OH 30.59 06/03/2017   VD25OH 28.16 (L) 04/24/2017   VD25OH 28.43 (L) 03/15/2017    VD25OH 28.02 (L) 05/21/2016   VD25OH 19.16 (L) 03/15/2016   VD25OH 25 (L) 05/18/2015   VD25OH 18.97 (L) 08/24/2014  She was on Ergocalciferol 50,000 IU once a week, now on vitamin D 5000 units daily.  B12 was low normal at last check: Lab Results  Component Value Date   VITAMINB12 246 07/17/2018   VITAMINB12 396 01/14/2018   VITAMINB12 530 03/15/2017   VITAMINB12 1,228 (H) 05/18/2015   VITAMINB12 237 03/07/2015   VITAMINB12 252 08/24/2014   On 1000 mcg B12 sublingual q 3 days >> she moved to qod after last visit.  She was taken out of work in the past for bilateral ankle pain and arch collapse.  Before last visit, she retired.  She is followed by Dr. Marin Olp for high platelets >> started ASA.  ROS: Constitutional: no weight gain/+ weight loss, no fatigue, no subjective hyperthermia, no subjective hypothermia Eyes: no blurry vision, no xerophthalmia ENT: no sore throat, + see HPI Cardiovascular: no CP/no SOB/no palpitations/no leg swelling Respiratory: no cough/no SOB/no wheezing Gastrointestinal: no N/no V/no D/no C/no acid reflux Musculoskeletal: no muscle aches/no joint aches Skin: no rashes, no hair loss Neurological: no tremors/no numbness/no tingling/no dizziness  I reviewed pt's medications, allergies, PMH, social hx, family hx, and changes were documented in the history of present illness. Otherwise, unchanged from my initial visit note.   Past Medical History:  Diagnosis Date  . A-fib [facility])    history of  . Acquired flat foot   . Ankle pain    bilateral  . Arthritis    feet ankles, and knees   . Cancer of kidney parenchyma, right ([place]) 09/08/2018  . Depression   . Diverticulosis   . Dyspnea    with exertion   . Dysrhythmia    hx of afib with hyperthroid no issues currently   . GERD (gastroesophageal reflux disease)   . H/O hiatal hernia 08/09/2018   Moderate to Large  . Hyperlipidemia   . Hypertension   . Hypothyroidism   . Iron deficiency anemia     iron deficiency last iron infusion 08/2018  . 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 Numbness    Left side of face around mouth - unknown etiology  . Numbness and tingling    left side - facial  . Palpitations    Occurred during hyperthyroid  . PONV (postoperative nausea and vomiting)   . Right renal mass 07/2018   2.1 cm solid heterogeneously enhancing mass in posterior midpole   . Sinus tachycardia by electrocardiogram 05/12/2018  . Thrombocythemia ([place])   . Thrombocytosis ([place]) 11/22/2017  . Thyroid cancer ([place])   . Thyroid disease    hyperthyroidism  . Vitamin B 12 deficiency   . Vitamin D deficiency    Past Surgical History:  Procedure Laterality Date  . BREAST BIOPSY     age 10 - negative  . CHOLECYSTECTOMY  1992  . OPERATIVE ULTRASOUND Right 09/25/2018   Procedure: OPERATIVE ULTRASOUND;  Surgeon: Raynelle Bring, MD;  Location: [facility];  Service: Urology;  Laterality: Right;  . ROBOTIC ASSITED PARTIAL NEPHRECTOMY Right 09/25/2018   Procedure: XI ROBOTIC RIGHT  ASSITED PARTIAL NEPHRECTOMY;  Surgeon: Raynelle Bring, MD;  Location: [facility];  Service: Urology;  Laterality: Right;  . THYROIDECTOMY N/A 07/03/2013   Procedure: TOTAL THYROIDECTOMY;  Surgeon: Earnstine Regal, MD;  Location: [facility];  Service: General;  Laterality: N/A;   Social History   Socioeconomic History  . Marital status: Divorced    Spouse name: Not on file  . Number of children: 1  . Years of education: college  . Highest education level: Associate degree: occupational, Hotel manager, or vocational program  Occupational History  . Occupation: Therapist, sports  Social Needs  . Financial resource strain: Not on file  . Food insecurity    Worry: Not on file    Inability: Not on file  . Transportation needs    Medical: Not on file    Non-medical: Not on file  Tobacco Use  . Smoking status: Never Smoker  . Smokeless tobacco: Never Used  Substance and Sexual Activity  . Alcohol use: No    Alcohol/week: 0.0 standard drinks  .  Drug use: No  . Sexual activity: Never  Lifestyle  . Physical activity    Days per week: Not on file    Minutes per session: Not on file  . Stress: Not on file  Relationships  . Social Herbalist on phone: Not on file    Gets together: Not on file    Attends religious service: Not on file    Active member of club or organization: Not on file    Attends meetings of clubs or organizations: Not on file    Relationship status: Not on file  . Intimate partner violence    Fear of current or ex partner: Not on file    Emotionally abused: Not on file    Physically abused: Not on file    Forced sexual activity: Not on file  Other Topics Concern  . Not on file  Social History Narrative   Lives at home alone.   Right-handed.   2 cups caffeine daily.   Current Outpatient Medications on File Prior to Visit  Medication Sig Dispense Refill  . acetaminophen (TYLENOL) 500 MG tablet Take 500 mg by mouth every 6 (six) hours as needed for pain.    Marland Kitchen amLODipine-benazepril (LOTREL) 5-20 MG capsule Take 1 capsule by mouth at bedtime. 90 capsule 1  . LORazepam (ATIVAN) 1 MG tablet Take 1 tablet (1 mg total) by mouth at bedtime as needed for anxiety or sleep. 30 tablet 0  . lovastatin (MEVACOR) 20 MG tablet Take 1 tablet (20 mg total) by mouth at bedtime. 90 tablet 3  . thyroid (ARMOUR THYROID) 15 MG tablet Take 1 tablet (15 mg total) by mouth daily. Along with the 60 mg tablet. (Patient taking differently: Take 15 mg by mouth daily before breakfast. Along with the 60 mg tablet.) 90 tablet 3  . thyroid (ARMOUR THYROID) 60 MG tablet Take every day along with the 15 mg tablet. (Patient taking differently: Take 60 mg by mouth daily before breakfast. Take every day along with the 15 mg tablet.) 90 tablet 3   No current facility-administered medications on file prior to visit.    Allergies  Allergen Reactions  . Penicillins Shortness Of Breath    Did it involve swelling of the  face/tongue/throat, SOB, or low BP? Yes Did it involve  sudden or severe rash/hives, skin peeling, or any reaction on the inside of your mouth or nose? No Did you need to seek medical attention at a hospital or doctor's office? Unknown When did it last happen?57-32 years old If all above answers are "NO", may proceed with cephalosporin use.    Family History  Problem Relation Age of Onset  . Hypertension Mother   . Diabetes Mother   . Hyperlipidemia Mother   . COPD Mother   . CAD Father        MI in his 68s  . Diabetes Father   . Hyperlipidemia Father   . Hypertension Father   . Mental illness Father   . Mental illness Maternal Grandmother   . Stroke Maternal Grandmother   . Diabetes Maternal Grandmother   . Stroke Maternal Grandfather   . Heart attack Maternal Grandfather   . Congestive Heart Failure Paternal Grandmother   . Heart attack Paternal Grandfather    PE: BP 118/70   Pulse 100   Ht _0  (1.575 m)   Wt 208 lb (94.3 kg)   SpO2 98%   BMI 38.04 kg/m  There is no height or weight on file to calculate BMI. Wt Readings from Last 3 Encounters:  01/12/19 208 lb (94.3 kg)  12/25/18 205 lb (93 kg)  09/25/18 213 lb (96.6 kg)   Constitutional: overweight, in NAD Eyes: PERRLA, EOMI, no exophthalmos ENT: moist mucous membranes, no neck masses felt on palpation, no cervical lymphadenopathy Cardiovascular: tachycardia, RR, No MRG Respiratory: CTA B Gastrointestinal: abdomen soft, NT, ND, BS+ Musculoskeletal: no deformities, strength intact in all 4 Skin: moist, warm, no rashes Neurological: no tremor with outstretched hands, DTR normal in all 4   ASSESSMENT: 1. Thyroid cancer - Papillary Thy Ca, follicular variant  - subcm, w/o extension to capsule, lymph or blood vessel, and without neck extension - s/p total thyroidectomy in 06/2013  2. Postsurgical hypothyroidism  3. Vitamin D deficiency  4. Low vitamin B12   PLAN: 1. PTC -She has a history of  follicular variant of PTC, which is the least aggressive PTC type.  She is stage I TNM. -No neck masses palpated her neck compression symptoms -Reviewed the report of her latest thyroid ultrasound from 2017: No cancer recurrence or metastases in the neck  2. Post-surgical hypothyroidism -Currently on Armour 75 mg daily (60+15 mg daily) -of note, she could not tolerate alternating 60 with 90 mg every other day due to more anxiety, palpitations, and a rash on her chest.  We tried to switch to Nature-Throid which is more pure, but this was on back order.  She was briefly on levothyroxine (100 mcg) + liothyronine (5 mcg) but she did not feel good on this-tired, not feeling like herself, gained weight.  However, she feels well on her current regimen. - latest thyroid labs reviewed with pt >> normal 07/2018 - we discussed about taking the thyroid hormone every day, with water, >30 minutes before breakfast, separated by >4 hours from acid reflux medications, calcium, iron, multivitamins. Pt. is taking it correctly. - will check thyroid tests today: TSH, free T3, and fT4 - If labs are abnormal, she will need to return for repeat TFTs in 1.5 months  3. And 4. Vitamin D deficiency and low vitamin B12 -B12 and vitamin D levels were low normal at last visit. -She continues supplementation with 1000 mcg vitamin B12 (every 3 days at last visit-I advised her to take it daily, but now takes it  qod) and 5000 units vitamin D daily -We will recheck the levels today  Needs refills Armour.  Office Visit on 01/12/2019  Component Date Value Ref Range Status  . Vitamin B-12 01/12/2019 408  211 - 911 pg/mL Final  . T3, Free 01/12/2019 3.4  2.3 - 4.2 pg/mL Final  . Free T4 01/12/2019 0.57* 0.60 - 1.60 ng/dL Final   Comment: Specimens from patients who are undergoing biotin therapy and /or ingesting biotin supplements may contain high levels of biotin.  The higher biotin concentration in these specimens interferes with  this Free T4 assay.  Specimens that contain high levels  of biotin may cause false high results for this Free T4 assay.  Please interpret results in light of the total clinical presentation of the patient.    Marland Kitchen TSH 01/12/2019 0.71  0.35 - 4.50 uIU/mL Final   The thyroid labs and the B12 look ok.  Philemon Kingdom, MD PhD [facility]

## 2019-01-12 NOTE — Patient Instructions (Signed)
  Please stop at the lab.  Continue Armour 75 mg daily.  Take the thyroid hormone every day, with water, at least 30 minutes before breakfast, separated by at least 4 hours from: - acid reflux medications - calcium - iron - multivitamins  Please come back for a follow-up appointment in 6 months.

## 2019-01-13 ENCOUNTER — Other Ambulatory Visit: Payer: Self-pay | Admitting: Internal Medicine

## 2019-01-13 DIAGNOSIS — C73 Malignant neoplasm of thyroid gland: Secondary | ICD-10-CM

## 2019-01-13 LAB — T4, FREE: Free T4: 0.57 ng/dL — ABNORMAL LOW (ref 0.60–1.60)

## 2019-01-13 LAB — TSH: TSH: 0.71 u[IU]/mL (ref 0.35–4.50)

## 2019-01-13 LAB — T3, FREE: T3, Free: 3.4 pg/mL (ref 2.3–4.2)

## 2019-01-13 LAB — VITAMIN B12: Vitamin B-12: 408 pg/mL (ref 211–911)

## 2019-01-13 MED ORDER — ARMOUR THYROID 60 MG PO TABS: ORAL_TABLET | ORAL | 3 refills | Status: DC

## 2019-01-13 MED ORDER — ARMOUR THYROID 15 MG PO TABS: ORAL_TABLET | ORAL | 3 refills | Status: DC

## 2019-01-13 MED FILL — ARMOUR THYROID 15 MG TABLET: 15 | 90 days supply | Qty: 90 | Fill #0

## 2019-01-13 MED FILL — ARMOUR THYROID 60 MG TABLET: 60 | 90 days supply | Qty: 90 | Fill #0

## 2019-01-15 ENCOUNTER — Ambulatory Visit (HOSPITAL_BASED_OUTPATIENT_CLINIC_OR_DEPARTMENT_OTHER)
Admission: RE | Admit: 2019-01-15 | Discharge: 2019-01-15 | Disposition: A | Discharge status: Home / Self Care | Payer: 59 | Source: Ambulatory Visit | Attending: Family Medicine | Admitting: Family Medicine

## 2019-01-15 ENCOUNTER — Encounter: Payer: Self-pay | Admitting: Family Medicine

## 2019-01-15 ENCOUNTER — Other Ambulatory Visit: Payer: Self-pay

## 2019-01-15 ENCOUNTER — Ambulatory Visit (HOSPITAL_BASED_OUTPATIENT_CLINIC_OR_DEPARTMENT_OTHER)
Admission: RE | Admit: 2019-01-15 | Discharge: 2019-01-15 | Disposition: A | Discharge status: Home / Self Care | Payer: 59 | Source: Ambulatory Visit | Attending: Internal Medicine | Admitting: Internal Medicine

## 2019-01-15 DIAGNOSIS — Z13820 Encounter for screening for osteoporosis: Secondary | ICD-10-CM | POA: Diagnosis not present

## 2019-01-15 DIAGNOSIS — Z9089 Acquired absence of other organs: Secondary | ICD-10-CM | POA: Diagnosis not present

## 2019-01-15 DIAGNOSIS — E2839 Other primary ovarian failure: Secondary | ICD-10-CM

## 2019-01-15 DIAGNOSIS — C73 Malignant neoplasm of thyroid gland: Secondary | ICD-10-CM | POA: Insufficient documentation

## 2019-01-15 DIAGNOSIS — Z85850 Personal history of malignant neoplasm of thyroid: Secondary | ICD-10-CM | POA: Diagnosis not present

## 2019-01-15 DIAGNOSIS — M8588 Other specified disorders of bone density and structure, other site: Secondary | ICD-10-CM | POA: Diagnosis not present

## 2019-01-15 MED FILL — AMLODIPINE-BENAZEPRIL 5-20: 5-20 | 90 days supply | Qty: 90 | Fill #1

## 2019-01-15 MED FILL — LOVASTATIN 20 MG TABLET: 20 | 90 days supply | Qty: 90 | Fill #1

## 2019-01-27 DIAGNOSIS — M79672 Pain in left foot: Secondary | ICD-10-CM | POA: Diagnosis not present

## 2019-01-27 DIAGNOSIS — M79671 Pain in right foot: Secondary | ICD-10-CM | POA: Diagnosis not present

## 2019-02-09 DIAGNOSIS — M722 Plantar fascial fibromatosis: Secondary | ICD-10-CM | POA: Diagnosis not present

## 2019-02-09 DIAGNOSIS — M7662 Achilles tendinitis, left leg: Secondary | ICD-10-CM | POA: Diagnosis not present

## 2019-02-09 DIAGNOSIS — M7661 Achilles tendinitis, right leg: Secondary | ICD-10-CM | POA: Diagnosis not present

## 2019-02-12 ENCOUNTER — Encounter: Payer: Self-pay | Admitting: Hematology & Oncology

## 2019-02-12 ENCOUNTER — Inpatient Hospital Stay: Payer: 59

## 2019-02-12 ENCOUNTER — Other Ambulatory Visit: Payer: Self-pay

## 2019-02-12 ENCOUNTER — Inpatient Hospital Stay: Payer: 59 | Attending: Hematology & Oncology | Admitting: Hematology & Oncology

## 2019-02-12 ENCOUNTER — Telehealth: Payer: Self-pay | Admitting: Hematology & Oncology

## 2019-02-12 VITALS — BP 121/81 | HR 88 | Temp 98.0°F | Resp 18 | Wt 208.0 lb

## 2019-02-12 DIAGNOSIS — Z85528 Personal history of other malignant neoplasm of kidney: Secondary | ICD-10-CM | POA: Diagnosis not present

## 2019-02-12 DIAGNOSIS — R109 Unspecified abdominal pain: Secondary | ICD-10-CM | POA: Diagnosis not present

## 2019-02-12 DIAGNOSIS — D473 Essential (hemorrhagic) thrombocythemia: Secondary | ICD-10-CM | POA: Insufficient documentation

## 2019-02-12 DIAGNOSIS — Z7982 Long term (current) use of aspirin: Secondary | ICD-10-CM | POA: Diagnosis not present

## 2019-02-12 DIAGNOSIS — D75839 Thrombocytosis, unspecified: Secondary | ICD-10-CM

## 2019-02-12 DIAGNOSIS — C641 Malignant neoplasm of right kidney, except renal pelvis: Secondary | ICD-10-CM

## 2019-02-12 DIAGNOSIS — Z79899 Other long term (current) drug therapy: Secondary | ICD-10-CM | POA: Diagnosis not present

## 2019-02-12 DIAGNOSIS — Z905 Acquired absence of kidney: Secondary | ICD-10-CM | POA: Insufficient documentation

## 2019-02-12 LAB — CBC WITH DIFFERENTIAL (CANCER CENTER ONLY)
Abs Immature Granulocytes: 0.02 10*3/uL (ref 0.00–0.07)
Basophils Absolute: 0 10*3/uL (ref 0.0–0.1)
Basophils Relative: 0 %
Eosinophils Absolute: 0.1 10*3/uL (ref 0.0–0.5)
Eosinophils Relative: 2 %
HCT: 40.7 % (ref 36.0–46.0)
Hemoglobin: 12.9 g/dL (ref 12.0–15.0)
Immature Granulocytes: 0 %
Lymphocytes Relative: 31 %
Lymphs Abs: 2.7 10*3/uL (ref 0.7–4.0)
MCH: 27 pg (ref 26.0–34.0)
MCHC: 31.7 g/dL (ref 30.0–36.0)
MCV: 85.3 fL (ref 80.0–100.0)
Monocytes Absolute: 0.8 10*3/uL (ref 0.1–1.0)
Monocytes Relative: 9 %
Neutro Abs: 5.2 10*3/uL (ref 1.7–7.7)
Neutrophils Relative %: 58 %
Platelet Count: 517 10*3/uL — ABNORMAL HIGH (ref 150–400)
RBC: 4.77 MIL/uL (ref 3.87–5.11)
RDW: 14.5 % (ref 11.5–15.5)
WBC Count: 8.9 10*3/uL (ref 4.0–10.5)
nRBC: 0 % (ref 0.0–0.2)

## 2019-02-12 LAB — LACTATE DEHYDROGENASE: LDH: 175 U/L (ref 98–192)

## 2019-02-12 LAB — CMP (CANCER CENTER ONLY)
ALT: 13 U/L (ref 0–44)
AST: 14 U/L — ABNORMAL LOW (ref 15–41)
Albumin: 3.7 g/dL (ref 3.5–5.0)
Alkaline Phosphatase: 60 U/L (ref 38–126)
Anion gap: 9 (ref 5–15)
BUN: 14 mg/dL (ref 8–23)
CO2: 29 mmol/L (ref 22–32)
Calcium: 8.9 mg/dL (ref 8.9–10.3)
Chloride: 101 mmol/L (ref 98–111)
Creatinine: 0.66 mg/dL (ref 0.44–1.00)
GFR, Est AFR Am: >60 mL/min (ref >60)
GFR, Estimated: >60 mL/min (ref >60)
Glucose, Bld: 95 mg/dL (ref 70–99)
Potassium: 4.3 mmol/L (ref 3.5–5.1)
Sodium: 139 mmol/L (ref 135–145)
Total Bilirubin: 0.3 mg/dL (ref 0.3–1.2)
Total Protein: 6.9 g/dL (ref 6.5–8.1)

## 2019-02-12 LAB — FERRITIN: Ferritin: 239 ng/mL (ref 11–307)

## 2019-02-12 LAB — SAVE SMEAR(SSMR), FOR PROVIDER SLIDE REVIEW

## 2019-02-12 LAB — IRON AND TIBC
Iron: 72 ug/dL (ref 41–142)
Saturation Ratios: 25 % (ref 21–57)
TIBC: 295 ug/dL (ref 236–444)
UIBC: 223 ug/dL (ref 120–384)

## 2019-02-12 NOTE — Progress Notes (Signed)
Hematology and Oncology Follow Up Visit  Karen Wall [id number] [birth year] 63 y.o. 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  So far, she seems to be doing pretty well.  She still has a little bit of discomfort in the right quadrant.  I am not sure if this is from where she had her right nephrectomy.  She had laparoscopic scars with the nephrectomy.  She goes back to see the urologist in a couple weeks.  She has had no problems with bleeding.  There is no change in bowel or bladder habits.  She has had no pain in her hands or feet.  She did develop some poison oak from her dog that brought it in.  This was on her left arm.  This seems to be healing up.  She has had no fever.  There is been no cough.  There has been no leg swelling.  Overall, her performance status is ECOG 1.  Medications:  Current Outpatient Medications:    acetaminophen (TYLENOL) 500 MG tablet, Take 500 mg by mouth every 6 (six) hours as needed for pain., Disp: , Rfl:    amLODipine-benazepril (LOTREL) 5-20 MG capsule, Take 1 capsule by mouth at bedtime., Disp: 90 capsule, Rfl: 1   LORazepam (ATIVAN) 1 MG tablet, Take 1 tablet (1 mg total) by mouth at bedtime as needed for anxiety or sleep., Disp: 30 tablet, Rfl: 0   lovastatin (MEVACOR) 20 MG tablet, Take 1 tablet (20 mg total) by mouth at bedtime., Disp: 90 tablet, Rfl: 3   thyroid (ARMOUR THYROID) 15 MG tablet, Take 1 tablet (15 mg total) by mouth daily. Along with the 60 mg tablet. (Patient taking differently: Take 15 mg by mouth daily before breakfast. Along with the 60 mg  tablet.), Disp: 90 tablet, Rfl: 3   thyroid (ARMOUR THYROID) 60 MG tablet, Take every day along with the 15 mg tablet. (Patient taking differently: Take 60 mg by mouth daily before breakfast. Take every day along with the 15 mg tablet.), Disp: 90 tablet, Rfl: 3   traMADol (ULTRAM) 50 MG tablet, Take 1-2 tablets (50-100 mg total) by mouth every 6 (six) hours as needed for moderate pain or severe pain., Disp: 20 tablet, Rfl: 0  Allergies:       Allergies  Allergen Reactions   Penicillins Shortness Of Breath    Did it involve swelling of the face/tongue/throat, SOB, or low BP? Yes Did it involve sudden or severe rash/hives, skin peeling, or any reaction on the inside of your mouth or nose? No Did you need to seek medical attention at a hospital or doctor's office? Unknown When did it last happen?49-75 years old If all above answers are NO, may proceed with cephalosporin use.     Past Medical History, Surgical history, Social history, and Family History were reviewed and updated.  Review of Systems: Review of Systems  Constitutional: Negative.  HENT:  Negative.   Eyes: Negative.   Respiratory: Negative.   Cardiovascular: Negative.   Gastrointestinal: Negative.   Endocrine: Negative.   Genitourinary: Negative.    Musculoskeletal: Negative.   Skin: Negative.   Neurological: Negative.   Hematological: Negative.   Psychiatric/Behavioral: Negative.     Physical Exam:  vitals were not taken for this visit.      Wt Readings from Last 3 Encounters:  09/25/18 213 lb (96.6 kg)  09/23/18 213 lb (96.6 kg)  09/08/18 198 lb (89.8 kg)    Physical Exam Vitals signs reviewed.  HENT:     Head: Normocephalic and atraumatic.  Eyes:     Pupils: Pupils are equal, round, and reactive to light.  Neck:     Musculoskeletal: Normal range of motion.  Cardiovascular:     Rate and Rhythm: Normal rate and regular rhythm.     Heart sounds: Normal heart sounds.  Pulmonary:       Effort: Pulmonary effort is normal.     Breath sounds: Normal breath sounds.  Abdominal:     General: Bowel sounds are normal.     Palpations: Abdomen is soft.  Musculoskeletal: Normal range of motion.        General: No tenderness or deformity.  Lymphadenopathy:     Cervical: No cervical adenopathy.  Skin:    General: Skin is warm and dry.     Findings: No erythema or rash.  Neurological:     Mental Status: She is alert and oriented to person, place, and time.  Psychiatric:        Behavior: Behavior normal.        Thought Content: Thought content normal.        Judgment: Judgment normal.      RecentLabs       Lab Results  Component Value Date   WBC 7.3 09/23/2018   HGB 12.0 09/26/2018   HCT 39.4 09/26/2018   MCV 87.6 09/23/2018   PLT 479 (H) 09/23/2018       Chemistry   Labs(Brief)          Component Value Date/Time   NA 137 09/26/2018 0339   K 4.0 09/26/2018 0339   CL 105 09/26/2018 0339   CO2 24 09/26/2018 0339   BUN 9 09/26/2018 0339   CREATININE 0.70 09/26/2018 0339   CREATININE 0.81 09/08/2018 1148   CREATININE 0.74 05/18/2015 1356     Labs(Brief)          Component Value Date/Time   CALCIUM 8.6 (L) 09/26/2018 0339   ALKPHOS 52 09/08/2018 1148   AST 13 (L) 09/08/2018 1148   ALT 13 09/08/2018 1148   BILITOT 0.2 (L) 09/08/2018 1148        Impression and Plan: Karen Wall is a 63 year old white female.  Thankfully, this renal mass is not going to be a problem for her.  I think that overall the chance of this renal mass coming back is going to be less then 5%.  Thankfully, her platelet count is actually lower.  As such, we do not have to put her on Hydrea.  We will still have to follow her along closely.  I think we get her back in 3 months now.  This would be reasonable.  Hopefully her platelet count will be maintained if not lower.     [name], MD 6/11/202012:28 PM

## 2019-02-12 NOTE — Telephone Encounter (Signed)
Appointments scheduled lmvm per 7/30 los

## 2019-02-13 ENCOUNTER — Telehealth: Payer: Self-pay | Admitting: *Deleted

## 2019-02-13 NOTE — Telephone Encounter (Signed)
As noted below by Dr. Ennever, I informed the patient of her iron level. She verbalized understanding. 

## 2019-02-13 NOTE — Telephone Encounter (Signed)
-----   Message from [name], MD sent at 02/12/2019  2:32 PM EDT ----- Call - the iron level is ok!!  Laurey Arrow

## 2019-02-25 ENCOUNTER — Other Ambulatory Visit (HOSPITAL_COMMUNITY): Payer: Self-pay | Admitting: Urology

## 2019-02-25 ENCOUNTER — Ambulatory Visit (HOSPITAL_COMMUNITY)
Admission: RE | Admit: 2019-02-25 | Discharge: 2019-02-25 | Disposition: A | Discharge status: Home / Self Care | Payer: 59 | Source: Ambulatory Visit | Attending: Urology | Admitting: Urology

## 2019-02-25 ENCOUNTER — Other Ambulatory Visit: Payer: Self-pay

## 2019-02-25 DIAGNOSIS — K449 Diaphragmatic hernia without obstruction or gangrene: Secondary | ICD-10-CM | POA: Diagnosis not present

## 2019-02-25 DIAGNOSIS — Z905 Acquired absence of kidney: Secondary | ICD-10-CM | POA: Diagnosis not present

## 2019-02-25 DIAGNOSIS — C641 Malignant neoplasm of right kidney, except renal pelvis: Secondary | ICD-10-CM

## 2019-02-27 DIAGNOSIS — M79672 Pain in left foot: Secondary | ICD-10-CM | POA: Diagnosis not present

## 2019-02-27 DIAGNOSIS — M79671 Pain in right foot: Secondary | ICD-10-CM | POA: Diagnosis not present

## 2019-03-30 DIAGNOSIS — M79672 Pain in left foot: Secondary | ICD-10-CM | POA: Diagnosis not present

## 2019-03-30 DIAGNOSIS — M79671 Pain in right foot: Secondary | ICD-10-CM | POA: Diagnosis not present

## 2019-04-09 ENCOUNTER — Other Ambulatory Visit: Payer: Self-pay | Admitting: Family Medicine

## 2019-04-09 MED FILL — ARMOUR THYROID 15 MG TABLET: 15 | 90 days supply | Qty: 90 | Fill #1

## 2019-04-09 MED FILL — ARMOUR THYROID 60 MG TABLET: 60 | 90 days supply | Qty: 90 | Fill #1

## 2019-04-09 MED FILL — LOVASTATIN 20 MG TABLET: 20 | 90 days supply | Qty: 90 | Fill #2

## 2019-04-10 MED FILL — AMLODIPINE-BENAZEPRIL 5-20: 5-20 | 90 days supply | Qty: 90 | Fill #0

## 2019-04-16 ENCOUNTER — Other Ambulatory Visit: Payer: Self-pay

## 2019-04-16 ENCOUNTER — Ambulatory Visit (INDEPENDENT_AMBULATORY_CARE_PROVIDER_SITE_OTHER): Payer: 59

## 2019-04-16 DIAGNOSIS — Z23 Encounter for immunization: Secondary | ICD-10-CM

## 2019-04-21 ENCOUNTER — Encounter: Payer: Self-pay | Admitting: Family Medicine

## 2019-04-21 DIAGNOSIS — F329 Major depressive disorder, single episode, unspecified: Secondary | ICD-10-CM

## 2019-04-22 MED ORDER — BUPROPION HCL ER (SR) 150 MG PO TB12: 150.0000 mg | ORAL_TABLET | Freq: Two times a day (BID) | ORAL | 6 refills | Status: DC

## 2019-04-22 MED FILL — BUPROPION HCL ER (SR) 150 M: 150 | 30 days supply | Qty: 60 | Fill #0

## 2019-04-22 NOTE — Addendum Note (Signed)
Addended by: Lamar Blinks C on: 04/22/2019 11:58 AM   Modules accepted: Orders

## 2019-04-23 ENCOUNTER — Telehealth: Payer: Self-pay | Admitting: Hematology & Oncology

## 2019-04-23 ENCOUNTER — Inpatient Hospital Stay: Payer: 59 | Attending: Hematology & Oncology

## 2019-04-23 ENCOUNTER — Inpatient Hospital Stay (HOSPITAL_BASED_OUTPATIENT_CLINIC_OR_DEPARTMENT_OTHER): Payer: 59 | Admitting: Hematology & Oncology

## 2019-04-23 ENCOUNTER — Other Ambulatory Visit: Payer: Self-pay

## 2019-04-23 VITALS — BP 119/78 | HR 85 | Temp 97.8°F | Resp 18 | Wt 208.5 lb

## 2019-04-23 DIAGNOSIS — Z905 Acquired absence of kidney: Secondary | ICD-10-CM | POA: Diagnosis not present

## 2019-04-23 DIAGNOSIS — D75839 Thrombocytosis, unspecified: Secondary | ICD-10-CM

## 2019-04-23 DIAGNOSIS — Z79899 Other long term (current) drug therapy: Secondary | ICD-10-CM | POA: Insufficient documentation

## 2019-04-23 DIAGNOSIS — D473 Essential (hemorrhagic) thrombocythemia: Secondary | ICD-10-CM

## 2019-04-23 DIAGNOSIS — D509 Iron deficiency anemia, unspecified: Secondary | ICD-10-CM | POA: Insufficient documentation

## 2019-04-23 DIAGNOSIS — C641 Malignant neoplasm of right kidney, except renal pelvis: Secondary | ICD-10-CM | POA: Insufficient documentation

## 2019-04-23 LAB — CMP (CANCER CENTER ONLY)
ALT: 11 U/L (ref 0–44)
AST: 13 U/L — ABNORMAL LOW (ref 15–41)
Albumin: 4 g/dL (ref 3.5–5.0)
Alkaline Phosphatase: 51 U/L (ref 38–126)
Anion gap: 7 (ref 5–15)
BUN: 16 mg/dL (ref 8–23)
CO2: 27 mmol/L (ref 22–32)
Calcium: 9.2 mg/dL (ref 8.9–10.3)
Chloride: 105 mmol/L (ref 98–111)
Creatinine: 0.7 mg/dL (ref 0.44–1.00)
GFR, Est AFR Am: >60 mL/min (ref >60)
GFR, Estimated: >60 mL/min (ref >60)
Glucose, Bld: 114 mg/dL — ABNORMAL HIGH (ref 70–99)
Potassium: 4.2 mmol/L (ref 3.5–5.1)
Sodium: 139 mmol/L (ref 135–145)
Total Bilirubin: 0.3 mg/dL (ref 0.3–1.2)
Total Protein: 7.3 g/dL (ref 6.5–8.1)

## 2019-04-23 LAB — CBC WITH DIFFERENTIAL (CANCER CENTER ONLY)
Abs Immature Granulocytes: 0.02 10*3/uL (ref 0.00–0.07)
Basophils Absolute: 0 10*3/uL (ref 0.0–0.1)
Basophils Relative: 1 %
Eosinophils Absolute: 0.1 10*3/uL (ref 0.0–0.5)
Eosinophils Relative: 2 %
HCT: 39.7 % (ref 36.0–46.0)
Hemoglobin: 12.7 g/dL (ref 12.0–15.0)
Immature Granulocytes: 0 %
Lymphocytes Relative: 27 %
Lymphs Abs: 2.3 10*3/uL (ref 0.7–4.0)
MCH: 27.1 pg (ref 26.0–34.0)
MCHC: 32 g/dL (ref 30.0–36.0)
MCV: 84.8 fL (ref 80.0–100.0)
Monocytes Absolute: 0.8 10*3/uL (ref 0.1–1.0)
Monocytes Relative: 9 %
Neutro Abs: 5.2 10*3/uL (ref 1.7–7.7)
Neutrophils Relative %: 61 %
Platelet Count: 535 10*3/uL — ABNORMAL HIGH (ref 150–400)
RBC: 4.68 MIL/uL (ref 3.87–5.11)
RDW: 14.3 % (ref 11.5–15.5)
WBC Count: 8.4 10*3/uL (ref 4.0–10.5)
nRBC: 0 % (ref 0.0–0.2)

## 2019-04-23 LAB — SAVE SMEAR(SSMR), FOR PROVIDER SLIDE REVIEW

## 2019-04-23 LAB — LACTATE DEHYDROGENASE: LDH: 176 U/L (ref 98–192)

## 2019-04-23 NOTE — Progress Notes (Signed)
Hematology and Oncology Follow Up Visit  Karen Wall [id number] May 26, 1956 63 y.o. 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  So far, she seems to be doing pretty well.  She still has a little bit of discomfort in the right quadrant.  I am not sure if this is from where she had her right nephrectomy.  She had laparoscopic scars with the nephrectomy.  She goes back to see the urologist in February.    She has had no fever.  There is been no cough.  There has been no leg swelling.  Overall, her performance status is ECOG 1.  Medications:  Current Outpatient Medications:  .  acetaminophen (TYLENOL) 500 MG tablet, Take 500 mg by mouth every 6 (six) hours as needed for pain., Disp: , Rfl:  .  amLODipine-benazepril (LOTREL) 5-20 MG capsule, Take 1 capsule by mouth at bedtime., Disp: 90 capsule, Rfl: 1 .  LORazepam (ATIVAN) 1 MG tablet, Take 1 tablet (1 mg total) by mouth at bedtime as needed for anxiety or sleep., Disp: 30 tablet, Rfl: 0 .  lovastatin (MEVACOR) 20 MG tablet, Take 1 tablet (20 mg total) by mouth at bedtime., Disp: 90 tablet, Rfl: 3 .  thyroid (ARMOUR THYROID) 15 MG tablet, Take 1 tablet (15 mg total) by mouth daily. Along with the 60 mg tablet. (Patient taking differently: Take 15 mg by mouth daily before breakfast. Along with the 60 mg tablet.), Disp: 90 tablet, Rfl: 3 .  thyroid (ARMOUR THYROID) 60 MG tablet, Take every day along with the 15 mg tablet. (Patient taking differently: Take 60 mg by mouth daily before breakfast. Take every day along with the 15 mg tablet.), Disp: 90 tablet, Rfl: 3 .   traMADol (ULTRAM) 50 MG tablet, Take 1-2 tablets (50-100 mg total) by mouth every 6 (six) hours as needed for moderate pain or severe pain., Disp: 20 tablet, Rfl: 0  Allergies:       Allergies  Allergen Reactions  . Penicillins Shortness Of Breath    Did it involve swelling of the face/tongue/throat, SOB, or low BP? Yes Did it involve sudden or severe rash/hives, skin peeling, or any reaction on the inside of your mouth or nose? No Did you need to seek medical attention at a hospital or doctor's office? Unknown When did it last happen?67-31 years old If all above answers are "NO", may proceed with cephalosporin use.     Past Medical History, Surgical history, Social history, and Family History were reviewed and updated.  Review of Systems: Review of Systems  Constitutional: Negative.   HENT:  Negative.   Eyes: Negative.   Respiratory: Negative.   Cardiovascular: Negative.   Gastrointestinal: Negative.   Endocrine: Negative.   Genitourinary: Negative.    Musculoskeletal: Negative.   Skin: Negative.   Neurological: Negative.   Hematological: Negative.   Psychiatric/Behavioral: Negative.     Physical Exam:  vitals  were not taken for this visit.      Wt Readings from Last 3 Encounters:  09/25/18 213 lb (96.6 kg)  09/23/18 213 lb (96.6 kg)  09/08/18 198 lb (89.8 kg)    Physical Exam Vitals signs reviewed.  HENT:     Head: Normocephalic and atraumatic.  Eyes:     Pupils: Pupils are equal, round, and reactive to light.  Neck:     Musculoskeletal: Normal range of motion.  Cardiovascular:     Rate and Rhythm: Normal rate and regular rhythm.     Heart sounds: Normal heart sounds.  Pulmonary:     Effort: Pulmonary effort is normal.     Breath sounds: Normal breath sounds.  Abdominal:     General: Bowel sounds are normal.     Palpations: Abdomen is soft.  Musculoskeletal: Normal range of motion.        General: No tenderness or deformity.   Lymphadenopathy:     Cervical: No cervical adenopathy.  Skin:    General: Skin is warm and dry.     Findings: No erythema or rash.  Neurological:     Mental Status: She is alert and oriented to person, place, and time.  Psychiatric:        Behavior: Behavior normal.        Thought Content: Thought content normal.        Judgment: Judgment normal.      RecentLabs       Lab Results  Component Value Date   WBC 7.3 09/23/2018   HGB 12.0 09/26/2018   HCT 39.4 09/26/2018   MCV 87.6 09/23/2018   PLT 479 (H) 09/23/2018       Chemistry   Labs(Brief)          Component Value Date/Time   NA 137 09/26/2018 0339   K 4.0 09/26/2018 0339   CL 105 09/26/2018 0339   CO2 24 09/26/2018 0339   BUN 9 09/26/2018 0339   CREATININE 0.70 09/26/2018 0339   CREATININE 0.81 09/08/2018 1148   CREATININE 0.74 05/18/2015 1356     Labs(Brief)          Component Value Date/Time   CALCIUM 8.6 (L) 09/26/2018 0339   ALKPHOS 52 09/08/2018 1148   AST 13 (L) 09/08/2018 1148   ALT 13 09/08/2018 1148   BILITOT 0.2 (L) 09/08/2018 1148        Impression and Plan: Karen Wall is a 63 year old white female.  Thankfully, this renal mass is not going to be a problem for her.  I think that overall the chance of this renal mass coming back is going to be less then 5%.  Thankfully, her platelet count is holding steady.  I think we plan to get her back now with 4 months.  This would be reasonable for me.    [name], MD 6/11/202012:28 PM

## 2019-04-23 NOTE — Telephone Encounter (Signed)
Appointments scheduled patient declined calendar due to My Chart per 10/8 los

## 2019-04-29 DIAGNOSIS — M79672 Pain in left foot: Secondary | ICD-10-CM | POA: Diagnosis not present

## 2019-04-29 DIAGNOSIS — M79671 Pain in right foot: Secondary | ICD-10-CM | POA: Diagnosis not present

## 2019-05-12 IMAGING — NM NM BONE WHOLE BODY
2 series · 2 of 2 positions shown · non-contrast
Comparison: None

Correlation: CT chest 08/09/2018

CLINICAL DATA: RIGHT renal mass, staging RIGHT renal cancer

EXAM:
NUCLEAR MEDICINE WHOLE BODY BONE SCAN
TECHNIQUE: Whole body anterior and posterior images were obtained approximately
3 hours after intravenous injection of radiopharmaceutical.
RADIOPHARMACEUTICALS:  21.9 mCi Technetium-XXm MDP IV

[Series 1: whole body · 2.66mm/px · 1 of 1 slices shown (1 of 2)]
[im 1/1]
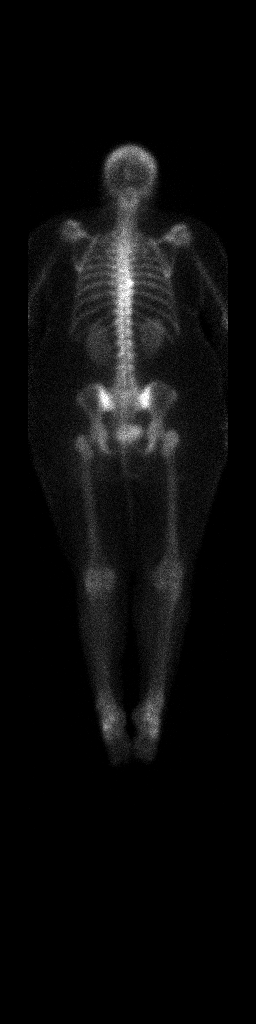

[Series 1: whole body · 2.66mm/px · 1 of 1 slices shown (2 of 2)]
[im 1/1]
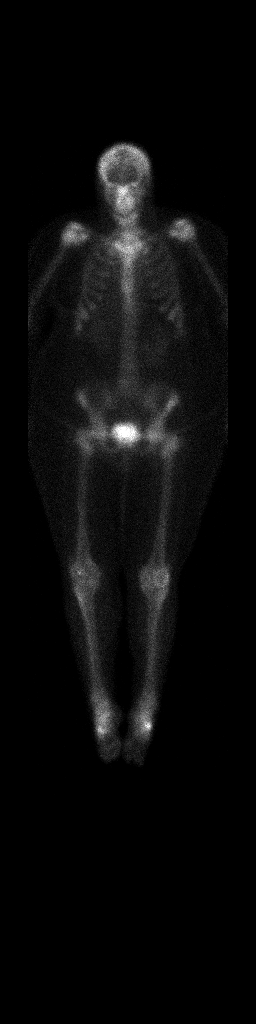

[2 of 2 positions shown; findings below may reference images not displayed]

FINDINGS: Uptake at the RIGHT costovertebral junction at approximately T8,
corresponding to degenerative changes on CT.

Minimal uptake at the shoulders, knees, and feet likely
degenerative.

No additional sites of worrisome osseous tracer accumulation are
identified to suggest osseous metastatic disease.

Expected urinary tract and soft tissue distribution of tracer.
IMPRESSION: No definite scintigraphic evidence of osseous metastatic disease.

## 2019-05-21 NOTE — Progress Notes (Signed)
[facility] [address], [place], [place] [address] [phone]  Date:  05/25/2019   Name:  Karen Wall   DOB:  November 06, 1955   MRN:  [id number]  PCP:  Darreld Mclean, MD    Chief Complaint: Medication Follow Up and Disability paperwork   History of Present Illness:  Karen Wall is a 63 y.o. very pleasant female patient who presents with the following:  Here today to follow-up on her medications and disability paperwork History of thyroid cancer s/p thyroidectomy 2014, HTN, renal cancer s/p partial nephrectomy 09/2018, iron def, essential thrombocytopenia Seen by oncology about a month ago- platelets stable, follow-up in 4 months  Seen by Dr Cruzita Lederer in June- will see her in December  1. 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  She is stage I TNM. -No neck masses palpated her neck compression symptoms -Reviewed the report of her latest thyroid ultrasound from 2017: No cancer recurrence or metastases in the neck 2. Post-surgical hypothyroidism -Currently on Armour 75 mg daily (60+15 mg daily) -of note, she could not tolerate alternating 60 with 90 mg every other day due to more anxiety, palpitations, and a rash on her chest.  We tried to switch to Nature-Throid which is more pure, but this was on back order.  She was briefly on levothyroxine (100 mcg) + liothyronine (5 mcg) but she did not feel good on this-tired, not feeling like herself, gained weight.  However, she feels well on her current regimen. - latest thyroid labs reviewed with pt >> normal 07/2018 - we discussed about taking the thyroid hormone every day, with water, >30 minutes before breakfast, separated by >4 hours from acid reflux medications, calcium, iron, multivitamins. Pt. is taking it correctly. - will check thyroid tests today: TSH, free T3, and fT4 - If labs are abnormal, she will need to  return for repeat TFTs in 1.5 months 3. And 4. Vitamin D deficiency and low vitamin B12 -B12 and vitamin D levels were low normal at last visit. -She continues supplementation with 1000 mcg vitamin B12 (every 3 days at last visit-I advised her to take it daily, but now takes it qod) and 5000 units vitamin D daily -We will recheck the levels today  Last seen by myself in February for a CPE/ prior to her partial nephrectomy surgery- everything went well.  She is healed but may have some occasional pain at her surgical site She had stopped her wellbutrin at that time as she was concerned that it could damage her kidney- I advised her ok to restart if she needs to.  She did go back on, taking 150 BID when she remembers but sometimes she will forget the 2nd dose She went back on wellbutrin 4-5 weeks ago Her depression is better, but she often feels anxious.  She also read the warning label, and brings to my attention the fact that she did have seizures as a young child.  She had really forgotten about this until recently.  The exact cause of her seizures is not certain, I wonder if they may have been febrile seizures.  Apparently they resolved by 63 year of age.  Interestingly her son had a similar phenomenon which she also outgrew  She will do follow-up scans for 5 years for her renal cell cancer She has been on disability due to foot issues for  several years.  Her podiatrist with [facility] has been doing her disability paperwork, but she is now being asked for a statement from her PCP.  I am glad to help her with this Recent labs on chart She is taking asa 81 daily for her high platelets   Her foot issues make it difficult for her to stand or walk for any extended period She also has some limitations with her right arm-  cannot abduct or flex past 90 degrees The left is ok   Lab Results  Component Value Date   TSH 0.71 01/12/2019   Lab Results  Component Value Date   HGBA1C 5.8  08/30/2017    Patient Active Problem List   Diagnosis Date Noted  . Seizure disorder ([place]) 05/25/2019  . Neoplasm of right kidney 09/25/2018  . Cancer of kidney parenchyma, right ([place]) 09/08/2018  . Essential hypertension 05/12/2018  . Numbness and tingling of left side of face 05/06/2018  . 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 Thrombocytosis ([place]) 11/22/2017  . Bilateral ankle pain 10/15/2017  . IDA (iron deficiency anemia) 07/23/2016  . Vitamin D deficiency 03/15/2016  . Low vitamin B12 level 03/15/2016  . Dyspnea 05/28/2014  . Chest tightness 05/28/2014  . 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 A-fib [facility])    history of  . Acquired flat foot   . Ankle pain    bilateral  . Arthritis    feet ankles, and knees   . Cancer of kidney parenchyma, right ([place]) 09/08/2018  . Depression   . Diverticulosis   . Dyspnea    with exertion   . Dysrhythmia    hx of afib with hyperthroid no issues currently   . GERD (gastroesophageal reflux disease)   . H/O hiatal hernia 08/09/2018   Moderate to Large  . Hyperlipidemia   . Hypertension   . Hypothyroidism   . Iron deficiency anemia    iron deficiency last iron infusion 08/2018  . 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 Numbness    Left side of face around mouth - unknown etiology  . Numbness and tingling    left side - facial  . Palpitations    Occurred during hyperthyroid  . PONV (postoperative nausea and vomiting)   . Right renal mass 07/2018   2.1 cm solid heterogeneously enhancing mass in posterior midpole   . Sinus tachycardia by electrocardiogram 05/12/2018  . Thrombocythemia ([place])   . Thrombocytosis ([place]) 11/22/2017  . Thyroid cancer ([place])   . Thyroid disease    hyperthyroidism  . Vitamin B 12 deficiency   . Vitamin D deficiency     Past Surgical History:  Procedure Laterality Date  . BREAST BIOPSY     age 21  - negative  . CHOLECYSTECTOMY  1992  . OPERATIVE ULTRASOUND Right 09/25/2018   Procedure: OPERATIVE ULTRASOUND;  Surgeon: Raynelle Bring, MD;  Location: [facility];  Service: Urology;  Laterality: Right;  . ROBOTIC ASSITED PARTIAL NEPHRECTOMY Right 09/25/2018   Procedure: XI ROBOTIC RIGHT  ASSITED PARTIAL NEPHRECTOMY;  Surgeon: Raynelle Bring, MD;  Location: [facility];  Service: Urology;  Laterality: Right;  . THYROIDECTOMY N/A 07/03/2013   Procedure: TOTAL THYROIDECTOMY;  Surgeon: Earnstine Regal, MD;  Location: [facility];  Service: General;  Laterality: N/A;    Social History   Tobacco Use  . Smoking status: Never Smoker  . Smokeless tobacco: Never Used  Substance Use Topics  . Alcohol use: No    Alcohol/week: 0.0 standard drinks  . Drug use: No    Family History  Problem Relation Age of Onset  . Hypertension Mother   . Diabetes Mother   . Hyperlipidemia Mother   . COPD Mother   . CAD Father        MI in his 49s  . Diabetes Father   . Hyperlipidemia Father   . Hypertension Father   . Mental illness Father   . Mental illness Maternal Grandmother   . Stroke Maternal Grandmother   . Diabetes Maternal Grandmother   . Stroke Maternal Grandfather   . Heart attack Maternal Grandfather   . Congestive Heart Failure Paternal Grandmother   . Heart attack Paternal Grandfather     Allergies  Allergen Reactions  . Penicillins Shortness Of Breath    Did it involve swelling of the face/tongue/throat, SOB, or low BP? Yes Did it involve sudden or severe rash/hives, skin peeling, or any reaction on the inside of your mouth or nose? No Did you need to seek medical attention at a hospital or doctor's office? Unknown When did it last happen?91-76 years old If all above answers are "NO", may proceed with cephalosporin use.     Medication list has been reviewed and updated.  Current Outpatient Medications on File Prior to Visit  Medication Sig Dispense Refill  . acetaminophen (TYLENOL) 500  MG tablet Take 500 mg by mouth every 6 (six) hours as needed for pain.    Marland Kitchen amLODipine-benazepril (LOTREL) 5-20 MG capsule TAKE 1 CAPSULE BY MOUTH AT BEDTIME. 90 capsule 1  . ARMOUR THYROID 15 MG tablet Take every day along with a 60 mg tablet. 90 tablet 3  . ARMOUR THYROID 60 MG tablet Take every day along with the 15 mg tablet. 90 tablet 3  . aspirin 81 MG EC tablet Take 81 mg by mouth daily. Swallow whole.    Marland Kitchen buPROPion (WELLBUTRIN SR) 150 MG 12 hr tablet Take 1 tablet (150 mg total) by mouth 2 (two) times daily. 60 tablet 6  . LORazepam (ATIVAN) 1 MG tablet Take 1 tablet (1 mg total) by mouth at bedtime as needed for anxiety or sleep. 30 tablet 0  . lovastatin (MEVACOR) 20 MG tablet Take 1 tablet (20 mg total) by mouth at bedtime. 90 tablet 3   No current facility-administered medications on file prior to visit.     Review of Systems:  As per HPI- otherwise negative.   Physical Examination: Vitals:   05/25/19 1045 05/25/19 1821  BP: 108/80   Pulse: (!) 101 96  Resp: 17   Temp: (!) 96.6 F (35.9 C)   SpO2: 98%    Vitals:   05/25/19 1045  Weight: 206 lb (93.4 kg)  Height: 5' 2" (1.575 m)   Body mass index is 37.68 kg/m. Ideal Body Weight: Weight in (lb) to have BMI = 25: 136.4  GEN: WDWN, NAD, Non-toxic, A & O x 3.  Obese, looks well HEENT: Atraumatic, Normocephalic. Neck supple. No masses, No LAD. Ears and Nose: No external deformity. CV: RRR, No M/G/R. No JVD. No thrill. No extra heart sounds. PULM: CTA B, no wheezes, crackles, rhonchi. No retractions. No resp. distress. No accessory muscle use. ABD: S, NT, ND, +BS. No rebound. No HSM. EXTR: No c/c/e NEURO Normal gait.  PSYCH: Normally interactive. Conversant. Not depressed or anxious appearing.  Calm demeanor.   Pulse Readings from Last 3 Encounters:  05/25/19  96  04/23/19 85  02/12/19 88    Assessment and Plan: Reactive depression - Plan: FLUoxetine (PROZAC) 20 MG capsule  Thyroid cancer, follicular  variant of papillary carcinoma  History of anemia  Postsurgical hypothyroidism  Essential hypertension  Seizure disorder ([facility])  Following up today Blood pressures well controlled on current regimen We will complete and submit her disability paperwork Jema has done fairly well on Wellbutrin, but is having some increased anxiety which can be a side effect.  In addition, I have now learned that she has a history of seizure disorder-we will add her problem list She has been using Wellbutrin successfully, so I suspect she will not have a seizure-however it would be safer to use a non-Wellbutrin medication.  We will plan to change her over to Prozac, discussed how to make this transition  She will let me know how the Prozac works for her  Signed Lamar Blinks, MD

## 2019-05-25 ENCOUNTER — Other Ambulatory Visit: Payer: Self-pay

## 2019-05-25 ENCOUNTER — Ambulatory Visit (INDEPENDENT_AMBULATORY_CARE_PROVIDER_SITE_OTHER): Payer: 59 | Admitting: Family Medicine

## 2019-05-25 ENCOUNTER — Encounter: Payer: Self-pay | Admitting: Family Medicine

## 2019-05-25 VITALS — BP 108/80 | HR 96 | Temp 96.6°F | Resp 17 | Ht 62.0 in | Wt 206.0 lb

## 2019-05-25 DIAGNOSIS — I10 Essential (primary) hypertension: Secondary | ICD-10-CM | POA: Diagnosis not present

## 2019-05-25 DIAGNOSIS — C73 Malignant neoplasm of thyroid gland: Secondary | ICD-10-CM | POA: Diagnosis not present

## 2019-05-25 DIAGNOSIS — E890 Postprocedural hypothyroidism: Secondary | ICD-10-CM

## 2019-05-25 DIAGNOSIS — G40909 Epilepsy, unspecified, not intractable, without status epilepticus: Secondary | ICD-10-CM | POA: Insufficient documentation

## 2019-05-25 DIAGNOSIS — F329 Major depressive disorder, single episode, unspecified: Secondary | ICD-10-CM | POA: Diagnosis not present

## 2019-05-25 DIAGNOSIS — Z862 Personal history of diseases of the blood and blood-forming organs and certain disorders involving the immune mechanism: Secondary | ICD-10-CM

## 2019-05-25 HISTORY — DX: Epilepsy, unspecified, not intractable, without status epilepticus: G40.909

## 2019-05-25 MED ORDER — FLUOXETINE HCL 20 MG PO CAPS: 20.0000 mg | ORAL_CAPSULE | Freq: Every day | ORAL | 6 refills | Status: DC

## 2019-05-25 MED FILL — BUPROPION HCL ER (SR) 150 M: 150 | 30 days supply | Qty: 60 | Fill #1

## 2019-05-25 MED FILL — FLUoxetine HCL 20 MG CAPS: 20 | 30 days supply | Qty: 60 | Fill #0

## 2019-05-25 NOTE — Patient Instructions (Signed)
It was good to see you today!  I will take care of your paperwork and fax in asap Let's try changing you from wellbutrin to prozac- this may reduce anxiety and should not increase risk of seizure Take wellbutrin just once a day for 3-4 days, then stop taking and change to prozac 20 mg.  You may increase to 40 mg after 2-3 weeks as needed/ desired Let me know how it works for you!

## 2019-05-30 DIAGNOSIS — M79671 Pain in right foot: Secondary | ICD-10-CM | POA: Diagnosis not present

## 2019-05-30 DIAGNOSIS — M79672 Pain in left foot: Secondary | ICD-10-CM | POA: Diagnosis not present

## 2019-06-29 DIAGNOSIS — M79672 Pain in left foot: Secondary | ICD-10-CM | POA: Diagnosis not present

## 2019-06-29 DIAGNOSIS — M79671 Pain in right foot: Secondary | ICD-10-CM | POA: Diagnosis not present

## 2019-06-30 ENCOUNTER — Telehealth: Payer: Self-pay

## 2019-06-30 NOTE — Telephone Encounter (Signed)
Fax received from [place] requesting [place] to release medical records.  We can not release medical records from the clinic.   Fax sent back to [facility] letting them know they need to submit request to Medical records and the fax number as well as phone number provided.

## 2019-07-01 ENCOUNTER — Telehealth: Payer: Self-pay

## 2019-07-01 NOTE — Telephone Encounter (Signed)
Copied from [place] [phone]. Topic: General - Other >> Jun 29, 2019  2:53 PM Yvette Rack wrote: Reason for CRM: Anda Kraft with [employer] called to see if the paperwork that she faxed earlier today was received. Anda Kraft asked that if the paperwork was received to please complete it and fax it back at [phone]. Anda Kraft requests call back at [phone]

## 2019-07-01 NOTE — Telephone Encounter (Signed)
Received paperwork-will be filled out.

## 2019-07-13 MED FILL — LOVASTATIN 20 MG TABS: 20 | 90 days supply | Qty: 90 | Fill #3

## 2019-07-13 MED FILL — ARMOUR THYROID 15 MG TABLET: 15 | 90 days supply | Qty: 90 | Fill #2

## 2019-07-13 MED FILL — FLUoxetine HCL 20 MG CAPS: 20 | 30 days supply | Qty: 60 | Fill #1

## 2019-07-13 MED FILL — ARMOUR THYROID 60 MG TABLET: 60 | 90 days supply | Qty: 90 | Fill #2

## 2019-07-13 MED FILL — AMLODIPINE-BENAZEPRIL 5-20: 5-20 | 90 days supply | Qty: 90 | Fill #1

## 2019-07-14 ENCOUNTER — Ambulatory Visit: Payer: 59 | Admitting: Internal Medicine

## 2019-07-30 DIAGNOSIS — M79672 Pain in left foot: Secondary | ICD-10-CM | POA: Diagnosis not present

## 2019-07-30 DIAGNOSIS — M79671 Pain in right foot: Secondary | ICD-10-CM | POA: Diagnosis not present

## 2019-08-04 ENCOUNTER — Other Ambulatory Visit: Payer: Self-pay | Admitting: Urology

## 2019-08-04 DIAGNOSIS — C641 Malignant neoplasm of right kidney, except renal pelvis: Secondary | ICD-10-CM

## 2019-08-24 ENCOUNTER — Inpatient Hospital Stay: Payer: 59 | Attending: Hematology & Oncology

## 2019-08-24 ENCOUNTER — Other Ambulatory Visit: Payer: Self-pay

## 2019-08-24 ENCOUNTER — Encounter: Payer: Self-pay | Admitting: Hematology & Oncology

## 2019-08-24 ENCOUNTER — Inpatient Hospital Stay (HOSPITAL_BASED_OUTPATIENT_CLINIC_OR_DEPARTMENT_OTHER): Payer: 59 | Admitting: Hematology & Oncology

## 2019-08-24 VITALS — BP 131/76 | HR 96 | Temp 97.6°F | Resp 18 | Ht 63.0 in | Wt 210.8 lb

## 2019-08-24 DIAGNOSIS — D509 Iron deficiency anemia, unspecified: Secondary | ICD-10-CM | POA: Insufficient documentation

## 2019-08-24 DIAGNOSIS — Z7982 Long term (current) use of aspirin: Secondary | ICD-10-CM | POA: Diagnosis not present

## 2019-08-24 DIAGNOSIS — C641 Malignant neoplasm of right kidney, except renal pelvis: Secondary | ICD-10-CM | POA: Diagnosis not present

## 2019-08-24 DIAGNOSIS — Z905 Acquired absence of kidney: Secondary | ICD-10-CM | POA: Diagnosis not present

## 2019-08-24 DIAGNOSIS — D473 Essential (hemorrhagic) thrombocythemia: Secondary | ICD-10-CM | POA: Diagnosis not present

## 2019-08-24 DIAGNOSIS — D500 Iron deficiency anemia secondary to blood loss (chronic): Secondary | ICD-10-CM | POA: Diagnosis not present

## 2019-08-24 DIAGNOSIS — Z85528 Personal history of other malignant neoplasm of kidney: Secondary | ICD-10-CM | POA: Insufficient documentation

## 2019-08-24 DIAGNOSIS — Z79899 Other long term (current) drug therapy: Secondary | ICD-10-CM | POA: Insufficient documentation

## 2019-08-24 DIAGNOSIS — D75839 Thrombocytosis, unspecified: Secondary | ICD-10-CM

## 2019-08-24 LAB — CMP (CANCER CENTER ONLY)
ALT: 12 U/L (ref 0–44)
AST: 13 U/L — ABNORMAL LOW (ref 15–41)
Albumin: 4 g/dL (ref 3.5–5.0)
Alkaline Phosphatase: 62 U/L (ref 38–126)
Anion gap: 8 (ref 5–15)
BUN: 16 mg/dL (ref 8–23)
CO2: 28 mmol/L (ref 22–32)
Calcium: 9.2 mg/dL (ref 8.9–10.3)
Chloride: 103 mmol/L (ref 98–111)
Creatinine: 0.72 mg/dL (ref 0.44–1.00)
GFR, Est AFR Am: >60 mL/min (ref >60)
GFR, Estimated: >60 mL/min (ref >60)
Glucose, Bld: 93 mg/dL (ref 70–99)
Potassium: 4.4 mmol/L (ref 3.5–5.1)
Sodium: 139 mmol/L (ref 135–145)
Total Bilirubin: 0.4 mg/dL (ref 0.3–1.2)
Total Protein: 7.4 g/dL (ref 6.5–8.1)

## 2019-08-24 LAB — FERRITIN: Ferritin: 81 ng/mL (ref 11–307)

## 2019-08-24 LAB — CBC WITH DIFFERENTIAL (CANCER CENTER ONLY)
Abs Immature Granulocytes: 0.02 10*3/uL (ref 0.00–0.07)
Basophils Absolute: 0.1 10*3/uL (ref 0.0–0.1)
Basophils Relative: 1 %
Eosinophils Absolute: 0.1 10*3/uL (ref 0.0–0.5)
Eosinophils Relative: 1 %
HCT: 40.4 % (ref 36.0–46.0)
Hemoglobin: 12.7 g/dL (ref 12.0–15.0)
Immature Granulocytes: 0 %
Lymphocytes Relative: 28 %
Lymphs Abs: 2.8 10*3/uL (ref 0.7–4.0)
MCH: 25.8 pg — ABNORMAL LOW (ref 26.0–34.0)
MCHC: 31.4 g/dL (ref 30.0–36.0)
MCV: 82.1 fL (ref 80.0–100.0)
Monocytes Absolute: 0.9 10*3/uL (ref 0.1–1.0)
Monocytes Relative: 9 %
Neutro Abs: 6.1 10*3/uL (ref 1.7–7.7)
Neutrophils Relative %: 61 %
Platelet Count: 593 10*3/uL — ABNORMAL HIGH (ref 150–400)
RBC: 4.92 MIL/uL (ref 3.87–5.11)
RDW: 14.7 % (ref 11.5–15.5)
WBC Count: 10 10*3/uL (ref 4.0–10.5)
nRBC: 0 % (ref 0.0–0.2)

## 2019-08-24 LAB — IRON AND TIBC
Iron: 52 ug/dL (ref 41–142)
Saturation Ratios: 16 % — ABNORMAL LOW (ref 21–57)
TIBC: 334 ug/dL (ref 236–444)
UIBC: 282 ug/dL (ref 120–384)

## 2019-08-24 LAB — SAVE SMEAR(SSMR), FOR PROVIDER SLIDE REVIEW

## 2019-08-24 LAB — LACTATE DEHYDROGENASE: LDH: 165 U/L (ref 98–192)

## 2019-08-24 MED ORDER — HYDROXYUREA 500 MG PO CAPS: 500.0000 mg | ORAL_CAPSULE | Freq: Every day | ORAL | 6 refills | Status: AC

## 2019-08-24 NOTE — Progress Notes (Signed)
Hematology and Oncology Follow Up Visit  Karen Wall [id number] 15-Jan-1956 64 y.o. 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  So far, she seems to be doing pretty well.  We last saw her back in October.  She had no problems over the holiday season.  There has been no issues with cough or shortness of breath.  She is still very cautious secondary to the coronavirus.  Unfortunately, her platelet count continues to go up.  Her platelet count today is 593,000.   I really think were going to have to get her on some agent to lower her platelet count.  I think Hydrea would be a good idea.  I think 500 mg daily would be tolerable and should be effective.  There is been no bleeding.  She has had no fever.  There is no change in bowel or bladder habits.  Overall, her performance status is ECOG 1.  Medications:  Current Outpatient Medications:  .  acetaminophen (TYLENOL) 500 MG tablet, Take 500 mg by mouth every 6 (six) hours as needed for pain., Disp: , Rfl:  .  amLODipine-benazepril (LOTREL) 5-20 MG capsule, Take 1 capsule by mouth at bedtime., Disp: 90 capsule, Rfl: 1 .  LORazepam (ATIVAN) 1 MG tablet, Take 1 tablet (1 mg total) by mouth at bedtime as needed for anxiety or sleep., Disp: 30 tablet, Rfl: 0 .  lovastatin (MEVACOR) 20 MG tablet, Take 1 tablet (20 mg total) by mouth at bedtime., Disp: 90 tablet, Rfl: 3 .  thyroid (ARMOUR THYROID) 15 MG tablet, Take 1 tablet (15 mg total) by mouth daily. Along with the 60 mg tablet. (Patient taking differently: Take 15  mg by mouth daily before breakfast. Along with the 60 mg tablet.), Disp: 90 tablet, Rfl: 3 .  thyroid (ARMOUR THYROID) 60 MG tablet, Take every day along with the 15 mg tablet. (Patient taking differently: Take 60 mg by mouth daily before breakfast. Take every day along with the 15 mg tablet.), Disp: 90 tablet, Rfl: 3 .  traMADol (ULTRAM) 50 MG tablet, Take 1-2 tablets (50-100 mg total) by mouth every 6 (six) hours as needed for moderate pain or severe pain., Disp: 20 tablet, Rfl: 0  Allergies:       Allergies  Allergen Reactions  . Penicillins Shortness Of Breath    Did it involve swelling of the face/tongue/throat, SOB, or low BP? Yes Did it involve sudden or severe rash/hives, skin peeling, or any reaction on the inside of your mouth or nose? No Did you need to seek medical attention at a hospital or doctor's office? Unknown When did it last happen?64-64 years old If all above answers are "NO", may proceed with cephalosporin use.     Past Medical History, Surgical history, Social history, and Family History were reviewed and updated.  Review of Systems: Review  of Systems  Constitutional: Negative.   HENT:  Negative.   Eyes: Negative.   Respiratory: Negative.   Cardiovascular: Negative.   Gastrointestinal: Negative.   Endocrine: Negative.   Genitourinary: Negative.    Musculoskeletal: Negative.   Skin: Negative.   Neurological: Negative.   Hematological: Negative.   Psychiatric/Behavioral: Negative.     Physical Exam:  vitals were not taken for this visit.      Wt Readings from Last 3 Encounters:  09/25/18 213 lb (96.6 kg)  09/23/18 213 lb (96.6 kg)  09/08/18 198 lb (89.8 kg)    Physical Exam Vitals signs reviewed.  HENT:     Head: Normocephalic and atraumatic.  Eyes:     Pupils: Pupils are equal, round, and reactive to light.  Neck:     Musculoskeletal: Normal range of motion.  Cardiovascular:     Rate and Rhythm: Normal rate and regular  rhythm.     Heart sounds: Normal heart sounds.  Pulmonary:     Effort: Pulmonary effort is normal.     Breath sounds: Normal breath sounds.  Abdominal:     General: Bowel sounds are normal.     Palpations: Abdomen is soft.  Musculoskeletal: Normal range of motion.        General: No tenderness or deformity.  Lymphadenopathy:     Cervical: No cervical adenopathy.  Skin:    General: Skin is warm and dry.     Findings: No erythema or rash.  Neurological:     Mental Status: She is alert and oriented to person, place, and time.  Psychiatric:        Behavior: Behavior normal.        Thought Content: Thought content normal.        Judgment: Judgment normal.      RecentLabs       Lab Results  Component Value Date   WBC 7.3 09/23/2018   HGB 12.0 09/26/2018   HCT 39.4 09/26/2018   MCV 87.6 09/23/2018   PLT 479 (H) 09/23/2018       Chemistry   Labs(Brief)          Component Value Date/Time   NA 137 09/26/2018 0339   K 4.0 09/26/2018 0339   CL 105 09/26/2018 0339   CO2 24 09/26/2018 0339   BUN 9 09/26/2018 0339   CREATININE 0.70 09/26/2018 0339   CREATININE 0.81 09/08/2018 1148   CREATININE 0.74 05/18/2015 1356     Labs(Brief)          Component Value Date/Time   CALCIUM 8.6 (L) 09/26/2018 0339   ALKPHOS 52 09/08/2018 1148   AST 13 (L) 09/08/2018 1148   ALT 13 09/08/2018 1148   BILITOT 0.2 (L) 09/08/2018 1148        Impression and Plan: Karen Wall is a 64 year old white female.  Thankfully, this renal malignancy is not going to be a problem for her.  I think that overall the chance of this kidney cancer coming back is going to be less then 5%.   We will see how the Hydrea works.  Again, I have believe that it will be effective.  I would like to see her back in 6 weeks for follow-up.   [name], MD 6/11/202012:28 PM

## 2019-08-25 ENCOUNTER — Ambulatory Visit (HOSPITAL_BASED_OUTPATIENT_CLINIC_OR_DEPARTMENT_OTHER)
Admission: RE | Admit: 2019-08-25 | Discharge: 2019-08-25 | Disposition: A | Discharge status: Home / Self Care | Payer: 59 | Source: Ambulatory Visit | Attending: Urology | Admitting: Urology

## 2019-08-25 ENCOUNTER — Other Ambulatory Visit: Payer: Self-pay | Admitting: Family

## 2019-08-25 ENCOUNTER — Telehealth: Payer: Self-pay | Admitting: Hematology & Oncology

## 2019-08-25 ENCOUNTER — Encounter (HOSPITAL_BASED_OUTPATIENT_CLINIC_OR_DEPARTMENT_OTHER): Payer: Self-pay

## 2019-08-25 ENCOUNTER — Inpatient Hospital Stay: Payer: 59

## 2019-08-25 VITALS — BP 114/65 | HR 77 | Temp 97.9°F | Resp 20

## 2019-08-25 DIAGNOSIS — D500 Iron deficiency anemia secondary to blood loss (chronic): Secondary | ICD-10-CM

## 2019-08-25 DIAGNOSIS — N289 Disorder of kidney and ureter, unspecified: Secondary | ICD-10-CM | POA: Diagnosis not present

## 2019-08-25 DIAGNOSIS — C641 Malignant neoplasm of right kidney, except renal pelvis: Secondary | ICD-10-CM | POA: Diagnosis not present

## 2019-08-25 DIAGNOSIS — K449 Diaphragmatic hernia without obstruction or gangrene: Secondary | ICD-10-CM | POA: Diagnosis not present

## 2019-08-25 DIAGNOSIS — D508 Other iron deficiency anemias: Secondary | ICD-10-CM

## 2019-08-25 MED ORDER — IOHEXOL 350 MG/ML SOLN
100.0000 mL | Freq: Once | INTRAVENOUS | Status: AC | PRN
  Administered 2019-08-25: 100 mL via INTRAVENOUS

## 2019-08-25 MED ORDER — SODIUM CHLORIDE 0.9 % IV SOLN
Freq: Once | INTRAVENOUS | Status: DC
  Filled 2019-08-25: qty 250

## 2019-08-25 MED ORDER — SODIUM CHLORIDE 0.9 % IV SOLN
INTRAVENOUS | Status: DC
  Filled 2019-08-25: qty 250

## 2019-08-25 MED ORDER — SODIUM CHLORIDE 0.9 % IV SOLN
200.0000 mg | Freq: Once | INTRAVENOUS | Status: AC
  Administered 2019-08-25: 200 mg via INTRAVENOUS
  Filled 2019-08-25: qty 200

## 2019-08-25 NOTE — Telephone Encounter (Signed)
Called and advised patient of infusion appointment that was added for today per 2/8 sch msg

## 2019-08-26 ENCOUNTER — Other Ambulatory Visit: Payer: Self-pay | Admitting: Family

## 2019-08-30 DIAGNOSIS — M79671 Pain in right foot: Secondary | ICD-10-CM | POA: Diagnosis not present

## 2019-08-30 DIAGNOSIS — M79672 Pain in left foot: Secondary | ICD-10-CM | POA: Diagnosis not present

## 2019-09-01 DIAGNOSIS — C641 Malignant neoplasm of right kidney, except renal pelvis: Secondary | ICD-10-CM | POA: Diagnosis not present

## 2019-09-02 ENCOUNTER — Encounter: Payer: Self-pay | Admitting: Family Medicine

## 2019-09-02 ENCOUNTER — Other Ambulatory Visit: Payer: Self-pay

## 2019-09-02 ENCOUNTER — Ambulatory Visit (INDEPENDENT_AMBULATORY_CARE_PROVIDER_SITE_OTHER): Payer: 59 | Admitting: Family Medicine

## 2019-09-02 VITALS — BP 124/80 | HR 102 | Temp 97.5°F | Resp 16 | Ht 63.0 in | Wt 212.0 lb

## 2019-09-02 DIAGNOSIS — C73 Malignant neoplasm of thyroid gland: Secondary | ICD-10-CM

## 2019-09-02 DIAGNOSIS — Z131 Encounter for screening for diabetes mellitus: Secondary | ICD-10-CM

## 2019-09-02 DIAGNOSIS — I10 Essential (primary) hypertension: Secondary | ICD-10-CM | POA: Diagnosis not present

## 2019-09-02 DIAGNOSIS — Z0000 Encounter for general adult medical examination without abnormal findings: Secondary | ICD-10-CM | POA: Diagnosis not present

## 2019-09-02 DIAGNOSIS — E785 Hyperlipidemia, unspecified: Secondary | ICD-10-CM | POA: Diagnosis not present

## 2019-09-02 DIAGNOSIS — F329 Major depressive disorder, single episode, unspecified: Secondary | ICD-10-CM

## 2019-09-02 MED ORDER — AMLODIPINE BESY-BENAZEPRIL HCL 5-20 MG PO CAPS: 1.0000 | ORAL_CAPSULE | Freq: Every day | ORAL | 3 refills | Status: DC

## 2019-09-02 MED ORDER — LOVASTATIN 20 MG PO TABS: 20.0000 mg | ORAL_TABLET | Freq: Every day | ORAL | 3 refills | Status: DC

## 2019-09-02 NOTE — Progress Notes (Addendum)
[place] at [facility] [address], [place], [place] [address] [phone]  Date:  09/02/2019   Name:  Karen Wall   DOB:  [birth year]   MRN:  [id number]  PCP:  Darreld Mclean, MD    Chief Complaint: Annual Exam   History of Present Illness:  Karen Wall is a 64 y.o. very pleasant female patient who presents with the following:  Here today for a routine CPE   History of thyroid cancer s/p thyroidectomy 2014, HTN, renal cancer s/p partial nephrectomy 09/2018, iron def, essential thrombocytosis.  She also has chronic foot problems which limit her mobility Seen by oncology earlier this month- they had wanted to add Hydrea. However they ended up holding off on this and giving her an iron infusion instead  She was able to get on SSD late last year- this is good news for her, Medicare insurance coverage will really help her  She is taking 40 mg of fluoxetine and this does seem to be helping her with her mood; she feels that it makes her somewhat dizzy, but does not currently wish to change this medication  Colon cancer screen up-to-date Mammogram is due, she will do soon Pap up-to-date Immunizations up-to-date, she declines Shingrix for now  She is taking a total of 75 mg of Armour Thyroid Lovastatin 20 Amlodipine/benazepril  Patient Active Problem List   Diagnosis Date Noted  . Seizure disorder ([place]) 05/25/2019  . Neoplasm of right kidney 09/25/2018  . Cancer of kidney parenchyma, right ([place]) 09/08/2018  . Essential hypertension 05/12/2018  . Numbness and tingling of left side of face 05/06/2018  . 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 Thrombocytosis ([place]) 11/22/2017  . Bilateral ankle pain 10/15/2017  . IDA (iron deficiency anemia) 07/23/2016  . Vitamin D deficiency 03/15/2016  . Low vitamin B12 level 03/15/2016  . Dyspnea 05/28/2014  . Chest tightness 05/28/2014  . Thyroid cancer, follicular variant of  papillary carcinoma 09/09/2013  . 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 A-fib [facility])    history of  . Acquired flat foot   . Ankle pain    bilateral  . Arthritis    feet ankles, and knees   . Cancer of kidney parenchyma, right ([place]) 09/08/2018  . Depression   . Diverticulosis   . Dyspnea    with exertion   . Dysrhythmia    hx of afib with hyperthroid no issues currently   . GERD (gastroesophageal reflux disease)   . H/O hiatal hernia 08/09/2018   Moderate to Large  . Hyperlipidemia   . Hypertension   . Hypothyroidism   . Iron deficiency anemia    iron deficiency last iron infusion 08/2018  . 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 Numbness    Left side of face around mouth - unknown etiology  . Numbness and tingling    left side - facial  . Palpitations    Occurred during hyperthyroid  . PONV (postoperative nausea and vomiting)   . Right renal mass 07/2018   2.1 cm solid heterogeneously enhancing mass in posterior midpole   . Sinus tachycardia by electrocardiogram 05/12/2018  . Thrombocythemia ([place])   . Thrombocytosis ([place]) 11/22/2017  . Thyroid cancer ([place])   . Thyroid disease    hyperthyroidism  . Vitamin B 12 deficiency   . Vitamin D deficiency     Past Surgical History:  Procedure Laterality Date  .  BREAST BIOPSY     age 64 - negative  . CHOLECYSTECTOMY  1992  . OPERATIVE ULTRASOUND Right 09/25/2018   Procedure: OPERATIVE ULTRASOUND;  Surgeon: Raynelle Bring, MD;  Location: [facility];  Service: Urology;  Laterality: Right;  . ROBOTIC ASSITED PARTIAL NEPHRECTOMY Right 09/25/2018   Procedure: XI ROBOTIC RIGHT  ASSITED PARTIAL NEPHRECTOMY;  Surgeon: Raynelle Bring, MD;  Location: [facility];  Service: Urology;  Laterality: Right;  . THYROIDECTOMY N/A 07/03/2013   Procedure: TOTAL THYROIDECTOMY;  Surgeon: Earnstine Regal, MD;  Location: [facility];  Service: General;  Laterality: N/A;    Social History   Tobacco Use  . Smoking status:  Never Smoker  . Smokeless tobacco: Never Used  Substance Use Topics  . Alcohol use: No    Alcohol/week: 0.0 standard drinks  . Drug use: No    Family History  Problem Relation Age of Onset  . Hypertension Mother   . Diabetes Mother   . Hyperlipidemia Mother   . COPD Mother   . CAD Father        MI in his 25s  . Diabetes Father   . Hyperlipidemia Father   . Hypertension Father   . Mental illness Father   . Mental illness Maternal Grandmother   . Stroke Maternal Grandmother   . Diabetes Maternal Grandmother   . Stroke Maternal Grandfather   . Heart attack Maternal Grandfather   . Congestive Heart Failure Paternal Grandmother   . Heart attack Paternal Grandfather     Allergies  Allergen Reactions  . Penicillins Shortness Of Breath    Did it involve swelling of the face/tongue/throat, SOB, or low BP? Yes Did it involve sudden or severe rash/hives, skin peeling, or any reaction on the inside of your mouth or nose? No Did you need to seek medical attention at a hospital or doctor's office? Unknown When did it last happen?8-20 years old If all above answers are "NO", may proceed with cephalosporin use.     Medication list has been reviewed and updated.  Current Outpatient Medications on File Prior to Visit  Medication Sig Dispense Refill  . acetaminophen (TYLENOL) 500 MG tablet Take 500 mg by mouth every 6 (six) hours as needed for pain.    Francia Greaves THYROID 15 MG tablet Take every day along with a 60 mg tablet. 90 tablet 3  . ARMOUR THYROID 60 MG tablet Take every day along with the 15 mg tablet. 90 tablet 3  . aspirin 81 MG EC tablet Take 81 mg by mouth daily. Swallow whole.    Marland Kitchen FLUoxetine (PROZAC) 20 MG capsule Take 1 capsule (20 mg total) by mouth daily. Increase to 40 mg after 2-3 weeks 60 capsule 6  . hydroxyurea (HYDREA) 500 MG capsule Take 1 capsule (500 mg total) by mouth daily. May take with food to minimize GI side effects. 30 capsule 6  . LORazepam (ATIVAN)  1 MG tablet Take 1 tablet (1 mg total) by mouth at bedtime as needed for anxiety or sleep. 30 tablet 0   No current facility-administered medications on file prior to visit.    Review of Systems:  As per HPI- otherwise negative.   Physical Examination: Vitals:   09/02/19 1428  BP: 124/80  Pulse: (!) 102  Resp: 16  Temp: (!) 97.5 F (36.4 C)  SpO2: 98%   Vitals:   09/02/19 1428  Weight: 212 lb (96.2 kg)  Height: 5\' 3"  (1.6 m)   Body mass index is 37.55 kg/m.  Ideal Body Weight: Weight in (lb) to have BMI = 25: 140.8  GEN: no acute distress.  Overweight, appears well HEENT: Atraumatic, Normocephalic. PEERL, TM within normal limits Ears and Nose: No external deformity. CV: RRR, No M/G/R. No JVD. No thrill. No extra heart sounds. PULM: CTA B, no wheezes, crackles, rhonchi. No retractions. No resp. distress. No accessory muscle use. ABD: S, NT, ND, +BS. No rebound. No HSM.  Benign abdomen EXTR: No c/c/e.  Wearing bilateral ankle braces as is baseline for her PSYCH: Normally interactive. Conversant.   Pulse Readings from Last 3 Encounters:  09/02/19 (!) 102  08/25/19 77  08/24/19 96   Noted mild tachycardia.  Patient denies any chest pain or shortness of breath, notes that her pulse is frequently elevated  Assessment and Plan: Physical exam  Reactive depression  Thyroid cancer ([place]) - Plan: TSH  Essential hypertension - Plan: amLODipine-benazepril (LOTREL) 5-20 MG capsule  Hyperlipidemia, unspecified hyperlipidemia type - Plan: Lipid panel, lovastatin (MEVACOR) 20 MG tablet  Screening for diabetes mellitus - Plan: Hemoglobin A1c   Here today for routine physical Refill medications as above Labs pending, I will be in touch with her results Blood pressure under good control on current medication Went over health maintenance, encouraged her to schedule mammogram I will update FMLA paperwork for her  Moderate medical decision making  This visit occurred during  the SARS-CoV-2 public health emergency.  Safety protocols were in place, including screening questions prior to the visit, additional usage of staff PPE, and extensive cleaning of exam room while observing appropriate contact time as indicated for disinfecting solutions.     Signed Lamar Blinks, MD  Received her labs 2/18, message to patient  Results for orders placed or performed in visit on 09/02/19  Hemoglobin A1c  Result Value Ref Range   Hgb A1c MFr Bld 5.5 <5.7 % of total Hgb   Mean Plasma Glucose 111 (calc)   eAG (mmol/L) 6.2 (calc)  Lipid panel  Result Value Ref Range   Cholesterol 204 (H) <200 mg/dL   HDL 63 > OR = 50 mg/dL   Triglycerides 85 <150 mg/dL   LDL Cholesterol (Calc) 123 (H) mg/dL (calc)   Total CHOL/HDL Ratio 3.2 <5.0 (calc)   Non-HDL Cholesterol (Calc) 141 (H) <130 mg/dL (calc)  TSH  Result Value Ref Range   TSH 1.26 0.40 - 4.50 mIU/L   The 10-year ASCVD risk score Mikey Bussing DC Jr., et al., 2013) is: 5.4%   Values used to calculate the score:     Age: 89 years     Sex: Female     Is Non-Hispanic African American: No     Diabetic: No     Tobacco smoker: No     Systolic Blood Pressure: [id number] mmHg     Is BP treated: Yes     HDL Cholesterol: 63 mg/dL     Total Cholesterol: 204 mg/dL

## 2019-09-02 NOTE — Patient Instructions (Signed)
Good to see you again today-  I will be in touch with your labs asap  Please plan for a mammogram as soon as you are able I will take care of your updated FMLA paperwork    Health Maintenance, Female Adopting a healthy lifestyle and getting preventive care are important in promoting health and wellness. Ask your health care provider about:  The right schedule for you to have regular tests and exams.  Things you can do on your own to prevent diseases and keep yourself healthy. What should I know about diet, weight, and exercise? Eat a healthy diet   Eat a diet that includes plenty of vegetables, fruits, low-fat dairy products, and lean protein.  Do not eat a lot of foods that are high in solid fats, added sugars, or sodium. Maintain a healthy weight Body mass index (BMI) is used to identify weight problems. It estimates body fat based on height and weight. Your health care provider can help determine your BMI and help you achieve or maintain a healthy weight. Get regular exercise Get regular exercise. This is one of the most important things you can do for your health. Most adults should:  Exercise for at least 150 minutes each week. The exercise should increase your heart rate and make you sweat (moderate-intensity exercise).  Do strengthening exercises at least twice a week. This is in addition to the moderate-intensity exercise.  Spend less time sitting. Even light physical activity can be beneficial. Watch cholesterol and blood lipids Have your blood tested for lipids and cholesterol at 64 years of age, then have this test every 5 years. Have your cholesterol levels checked more often if:  Your lipid or cholesterol levels are high.  You are older than 64 years of age.  You are at high risk for heart disease. What should I know about cancer screening? Depending on your health history and family history, you may need to have cancer screening at various ages. This may include  screening for:  Breast cancer.  Cervical cancer.  Colorectal cancer.  Skin cancer.  Lung cancer. What should I know about heart disease, diabetes, and high blood pressure? Blood pressure and heart disease  High blood pressure causes heart disease and increases the risk of stroke. This is more likely to develop in people who have high blood pressure readings, are of African descent, or are overweight.  Have your blood pressure checked: ? Every 3-5 years if you are 74-49 years of age. ? Every year if you are 33 years old or older. Diabetes Have regular diabetes screenings. This checks your fasting blood sugar level. Have the screening done:  Once every three years after age 41 if you are at a normal weight and have a low risk for diabetes.  More often and at a younger age if you are overweight or have a high risk for diabetes. What should I know about preventing infection? Hepatitis B If you have a higher risk for hepatitis B, you should be screened for this virus. Talk with your health care provider to find out if you are at risk for hepatitis B infection. Hepatitis C Testing is recommended for:  Everyone born from [birth year] through 1965.  Anyone with known risk factors for hepatitis C. Sexually transmitted infections (STIs)  Get screened for STIs, including gonorrhea and chlamydia, if: ? You are sexually active and are younger than 64 years of age. ? You are older than 64 years of age and your health care  provider tells you that you are at risk for this type of infection. ? Your sexual activity has changed since you were last screened, and you are at increased risk for chlamydia or gonorrhea. Ask your health care provider if you are at risk.  Ask your health care provider about whether you are at high risk for HIV. Your health care provider may recommend a prescription medicine to help prevent HIV infection. If you choose to take medicine to prevent HIV, you should first get  tested for HIV. You should then be tested every 3 months for as long as you are taking the medicine. Pregnancy  If you are about to stop having your period (premenopausal) and you may become pregnant, seek counseling before you get pregnant.  Take 400 to 800 micrograms (mcg) of folic acid every day if you become pregnant.  Ask for birth control (contraception) if you want to prevent pregnancy. Osteoporosis and menopause Osteoporosis is a disease in which the bones lose minerals and strength with aging. This can result in bone fractures. If you are 82 years old or older, or if you are at risk for osteoporosis and fractures, ask your health care provider if you should:  Be screened for bone loss.  Take a calcium or vitamin D supplement to lower your risk of fractures.  Be given hormone replacement therapy (HRT) to treat symptoms of menopause. Follow these instructions at home: Lifestyle  Do not use any products that contain nicotine or tobacco, such as cigarettes, e-cigarettes, and chewing tobacco. If you need help quitting, ask your health care provider.  Do not use street drugs.  Do not share needles.  Ask your health care provider for help if you need support or information about quitting drugs. Alcohol use  Do not drink alcohol if: ? Your health care provider tells you not to drink. ? You are pregnant, may be pregnant, or are planning to become pregnant.  If you drink alcohol: ? Limit how much you use to 0-1 drink a day. ? Limit intake if you are breastfeeding.  Be aware of how much alcohol is in your drink. In the [place] one drink equals one 12 oz bottle of beer (355 mL), one 5 oz glass of wine (148 mL), or one 1 oz glass of hard liquor (44 mL). General instructions  Schedule regular health, dental, and eye exams.  Stay current with your vaccines.  Tell your health care provider if: ? You often feel depressed. ? You have ever been abused or do not feel safe at  home. Summary  Adopting a healthy lifestyle and getting preventive care are important in promoting health and wellness.  Follow your health care provider's instructions about healthy diet, exercising, and getting tested or screened for diseases.  Follow your health care provider's instructions on monitoring your cholesterol and blood pressure. This information is not intended to replace advice given to you by your health care provider. Make sure you discuss any questions you have with your health care provider. Document Revised: 06/25/2018 Document Reviewed: 06/25/2018 Elsevier Patient Education  2020 [employer].

## 2019-09-02 NOTE — Progress Notes (Deleted)
[place] at [facility] [address] [place], [place] [address] [phone]  Date:  09/03/2019   Name:  Karen Wall   DOB:  04-01-1956   MRN:  [id number]  PCP:  Darreld Mclean, MD    Chief Complaint: No chief complaint on file.   History of Present Illness:  Karen Wall is a 64 y.o. very pleasant female patient who presents with the following:  Here today for routine physical-history of hypertension, seizure disorder, kidney cancer status post partial nephrectomy March 2020, thyroid cancer status post thyroidectomy 2014 with postsurgical hypothyroidism, vitamin D and vitamin B12 deficiency, iron deficiency Last seen by myself in November to discuss depression.  At that time we changed her from Wellbutrin to fluoxetine, due to lack of effect and also previous history of seizure as a child which I just became aware of  Her oncologist is Dr. 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  Could order A1c, thyroid, lipids Lab Results  Component Value Date   TSH 0.71 01/12/2019   Lab Results  Component Value Date   HGBA1C 5.8 08/30/2017     Patient Active Problem List   Diagnosis Date Noted  . Seizure disorder ([place]) 05/25/2019  . Neoplasm of right kidney 09/25/2018  . Cancer of kidney parenchyma, right ([place]) 09/08/2018  . Essential hypertension 05/12/2018  . Numbness and  tingling of left side of face 05/06/2018  . 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 Thrombocytosis ([place]) 11/22/2017  . Bilateral ankle pain 10/15/2017  . IDA (iron deficiency anemia) 07/23/2016  . Vitamin D deficiency 03/15/2016  . Low vitamin B12 level 03/15/2016  . Dyspnea 05/28/2014  . Chest tightness 05/28/2014  . 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 A-fib [facility])    history of  . Acquired flat foot   . Ankle pain    bilateral  . Arthritis    feet ankles, and knees   . Cancer of kidney parenchyma, right ([place]) 09/08/2018  . Depression   . Diverticulosis   . Dyspnea    with exertion   . Dysrhythmia    hx of afib with hyperthroid no issues currently   . GERD (gastroesophageal reflux disease)   . H/O hiatal hernia 08/09/2018   Moderate to Large  . Hyperlipidemia   . Hypertension   . Hypothyroidism   . Iron deficiency anemia    iron deficiency last iron infusion 08/2018  . 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 Numbness    Left side of face around mouth - unknown etiology  . Numbness and tingling    left side - facial  . Palpitations    Occurred during hyperthyroid  . PONV (postoperative nausea and vomiting)   . Right renal mass 07/2018   2.1 cm solid heterogeneously enhancing mass in posterior midpole   . Sinus tachycardia by electrocardiogram  05/12/2018  . Thrombocythemia ([place])   . Thrombocytosis ([place]) 11/22/2017  . Thyroid cancer ([place])   . Thyroid disease    hyperthyroidism  . Vitamin B 12 deficiency   . Vitamin D deficiency     Past Surgical History:  Procedure Laterality Date  . BREAST BIOPSY     age 68 - negative  . CHOLECYSTECTOMY  1992  . OPERATIVE ULTRASOUND Right 09/25/2018   Procedure: OPERATIVE ULTRASOUND;  Surgeon: Raynelle Bring, MD;  [facility];  Service: Urology;  Laterality: Right;  . ROBOTIC ASSITED PARTIAL NEPHRECTOMY Right 09/25/2018    Procedure: XI ROBOTIC RIGHT  ASSITED PARTIAL NEPHRECTOMY;  Surgeon: Raynelle Bring, MD;  [facility];  Service: Urology;  Laterality: Right;  . THYROIDECTOMY N/A 07/03/2013   Procedure: TOTAL THYROIDECTOMY;  Surgeon: Earnstine Regal, MD;  Location[facility];  Service: General;  Laterality: N/A;    Social History   Tobacco Use  . Smoking status: Never Smoker  . Smokeless tobacco: Never Used  Substance Use Topics  . Alcohol use: No    Alcohol/week: 0.0 standard drinks  . Drug use: No    Family History  Problem Relation Age of Onset  . Hypertension Mother   . Diabetes Mother   . Hyperlipidemia Mother   . COPD Mother   . CAD Father        MI in his 50s  . Diabetes Father   . Hyperlipidemia Father   . Hypertension Father   . Mental illness Father   . Mental illness Maternal Grandmother   . Stroke Maternal Grandmother   . Diabetes Maternal Grandmother   . Stroke Maternal Grandfather   . Heart attack Maternal Grandfather   . Congestive Heart Failure Paternal Grandmother   . Heart attack Paternal Grandfather     Allergies  Allergen Reactions  . Penicillins Shortness Of Breath    Did it involve swelling of the face/tongue/throat, SOB, or low BP? Yes Did it involve sudden or severe rash/hives, skin peeling, or any reaction on the inside of your mouth or nose? No Did you need to seek medical attention at a hospital or doctor's office? Unknown When did it last happen?61-82 years old If all above answers are "NO", may proceed with cephalosporin use.     Medication list has been reviewed and updated.  Current Outpatient Medications on File Prior to Visit  Medication Sig Dispense Refill  . acetaminophen (TYLENOL) 500 MG tablet Take 500 mg by mouth every 6 (six) hours as needed for pain.    Marland Kitchen amLODipine-benazepril (LOTREL) 5-20 MG capsule TAKE 1 CAPSULE BY MOUTH AT BEDTIME. 90 capsule 1  . ARMOUR THYROID 15 MG tablet Take every day along with a 60 mg tablet. 90 tablet 3   . ARMOUR THYROID 60 MG tablet Take every day along with the 15 mg tablet. 90 tablet 3  . aspirin 81 MG EC tablet Take 81 mg by mouth daily. Swallow whole.    Marland Kitchen buPROPion (WELLBUTRIN SR) 150 MG 12 hr tablet Take 1 tablet (150 mg total) by mouth 2 (two) times daily. (Patient not taking: Reported on 08/24/2019) 60 tablet 6  . FLUoxetine (PROZAC) 20 MG capsule Take 1 capsule (20 mg total) by mouth daily. Increase to 40 mg after 2-3 weeks 60 capsule 6  . hydroxyurea (HYDREA) 500 MG capsule Take 1 capsule (500 mg total) by mouth daily. May take with food to minimize GI side effects. 30 capsule 6  . LORazepam (ATIVAN) 1 MG tablet  Take 1 tablet (1 mg total) by mouth at bedtime as needed for anxiety or sleep. 30 tablet 0  . lovastatin (MEVACOR) 20 MG tablet Take 1 tablet (20 mg total) by mouth at bedtime. 90 tablet 3   No current facility-administered medications on file prior to visit.    Review of Systems:  As per HPI- otherwise negative.   Physical Examination: There were no vitals filed for this visit. There were no vitals filed for this visit. There is no height or weight on file to calculate BMI. Ideal Body Weight:    GEN: no acute distress. HEENT: Atraumatic, Normocephalic.  Ears and Nose: No external deformity. CV: RRR, No M/G/R. No JVD. No thrill. No extra heart sounds. PULM: CTA B, no wheezes, crackles, rhonchi. No retractions. No resp. distress. No accessory muscle use. ABD: S, NT, ND, +BS. No rebound. No HSM. EXTR: No c/c/e PSYCH: Normally interactive. Conversant.    Assessment and Plan: ***  Signed Lamar Blinks, MD

## 2019-09-03 ENCOUNTER — Encounter: Payer: 59 | Admitting: Family Medicine

## 2019-09-03 ENCOUNTER — Encounter: Payer: Self-pay | Admitting: Family Medicine

## 2019-09-03 LAB — LIPID PANEL
Cholesterol: 204 mg/dL — ABNORMAL HIGH (ref <200)
HDL: 63 mg/dL (ref >=50)
LDL Cholesterol (Calc): 123 mg/dL (calc) — ABNORMAL HIGH
Non-HDL Cholesterol (Calc): 141 mg/dL (calc) — ABNORMAL HIGH (ref <130)
Total CHOL/HDL Ratio: 3.2 (calc) (ref <5.0)
Triglycerides: 85 mg/dL (ref <150)

## 2019-09-03 LAB — TSH: TSH: 1.26 mIU/L (ref 0.40–4.50)

## 2019-09-03 LAB — HEMOGLOBIN A1C
Hgb A1c MFr Bld: 5.5 % of total Hgb (ref <5.7)
Mean Plasma Glucose: 111 (calc)
eAG (mmol/L): 6.2 (calc)

## 2019-09-23 DIAGNOSIS — Z23 Encounter for immunization: Secondary | ICD-10-CM | POA: Diagnosis not present

## 2019-09-24 ENCOUNTER — Other Ambulatory Visit: Payer: Self-pay

## 2019-09-27 DIAGNOSIS — M79672 Pain in left foot: Secondary | ICD-10-CM | POA: Diagnosis not present

## 2019-09-27 DIAGNOSIS — M79671 Pain in right foot: Secondary | ICD-10-CM | POA: Diagnosis not present

## 2019-09-28 ENCOUNTER — Ambulatory Visit: Payer: 59 | Admitting: Internal Medicine

## 2019-09-28 ENCOUNTER — Other Ambulatory Visit: Payer: Self-pay

## 2019-09-28 ENCOUNTER — Encounter: Payer: Self-pay | Admitting: Internal Medicine

## 2019-09-28 VITALS — BP 130/80 | HR 92 | Ht 62.5 in | Wt 208.0 lb

## 2019-09-28 DIAGNOSIS — R7989 Other specified abnormal findings of blood chemistry: Secondary | ICD-10-CM

## 2019-09-28 DIAGNOSIS — E538 Deficiency of other specified B group vitamins: Secondary | ICD-10-CM

## 2019-09-28 DIAGNOSIS — C73 Malignant neoplasm of thyroid gland: Secondary | ICD-10-CM | POA: Diagnosis not present

## 2019-09-28 DIAGNOSIS — E559 Vitamin D deficiency, unspecified: Secondary | ICD-10-CM

## 2019-09-28 DIAGNOSIS — E890 Postprocedural hypothyroidism: Secondary | ICD-10-CM | POA: Diagnosis not present

## 2019-09-28 LAB — VITAMIN B12: Vitamin B-12: 499 pg/mL (ref 211–911)

## 2019-09-28 LAB — VITAMIN D 25 HYDROXY (VIT D DEFICIENCY, FRACTURES): VITD: 56.99 ng/mL (ref 30.00–100.00)

## 2019-09-28 NOTE — Patient Instructions (Signed)
  Please stop at the lab.  Continue Armour 75 mg daily.  Take the thyroid hormone every day, with water, at least 30 minutes before breakfast, separated by at least 4 hours from: - acid reflux medications - calcium - iron - multivitamins  Continue: - vitamin D 5000 units daily - vitamin B12 1000 mcg every other day  Please come back for a follow-up appointment in 1 year.

## 2019-09-28 NOTE — Progress Notes (Signed)
Patient ID: SEMYA KLINKE, female   DOB: 08/05/1955, 64 y.o.   MRN: [id number]  This visit occurred during the SARS-CoV-2 public health emergency.  Safety protocols were in place, including screening questions prior to the visit, additional usage of staff PPE, and extensive cleaning of exam room while observing appropriate contact time as indicated for disinfecting solutions.   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 Last visit 9 months ago.  Before last visit she was diagnosed with stage I kidney cancer and she had robotic right nephrectomy.  Reviewed history: In 03/2013, she developed fevers >> TSH checked >> hyperthyroidism (TMNG) >> started MMI >> continued for 1 month.   She also had a thyroid U/S in 03/2013 >> 2 thyroid nodules >> FNA of each in 04/2013 was negative. However, she opted for thyroidectomy for the TMNG.  She had total thyroidectomy 06/2013>> one focus of follicular variant of PTC of 0.8 cm : Specimen: Thyroid Procedure: Thyroidectomy Specimen Integrity (intact/fragmented): Intact Tumor focality: Unifocal Dominant tumor: Right lobe Maximum tumor size (cm): 0.8 cm Tumor laterality: Right Histologic type (including subtype and/or unique features as applicable): Follicular variant papillary thyroid carcinoma Tumor capsule: N/A Extrathyroidal extension: No Margins: Free of tumor Lymph - Vascular invasion: No Capsular invasion with degree of invasion if present: N/A Lymph nodes: # examined 0; # positive; N/A TNM code: pT1a, pNX Non-neoplastic thyroid: Multinodular goiter. Comments: There is a 0.8 cm nodule of papillary thyroid carcinoma, follicular variant in the right superior lobe which does not involve the margin and is confined within the right lobe. The remainder of the thyroid shows features of multinodular goiter.  Dr Loanne Drilling  advised against RAI Tx and against following Thyroglobulin. She then sought a second opinion with me >> I I recommended the same.  Neck U/S (04/02/2014): No evidence of abnormal soft tissue in the surgical thyroid bed. Nonenlarged cervical lymph nodes identified.  Neck U/S (03/29/2016):  normal post thyroidectomy appearance.  No suspicious lymphadenopathy  Pt denies: - feeling nodules in neck - hoarseness - dysphagia - choking - SOB with lying down  Post-surgical hypothyroidism:  Reviewed history: Started on 100 mcg Synthroid  - brand name after sx >> TSH 0.074 >> dose decrease to 50 mcg >> pt felt terrible >> TSH 14.6 >> dose increased to 75 mcg, but TSH returned above goal, at 2.76 >> we increased to 88 mcg daily. However, We added T3 to help with fatigue (Armour) 02/2017 >> then 60 and 90 mg every other day.   However, after the increase in dose >> rash, palpitations, panic attacks, SOB but feeling more energetic and less depressed >> I suggested to change to Naturethroid 81.25 mg. This was on back order >> then Levothyroxine 100 mcg + Liothyronine 5 mcg daily.  She did not feel too well on this combination.  She is still very tired, crashing around 3 PM, and feels that she gained weight.  She did not feel well on the 90 alternating with 60 mg qod >> emotional fluctuation, anxiety, palpitations.  She is on Armour 60+15 mg daily taken: - in am - fasting - at least 30 min from b'fast - no Ca, Fe, MVI, PPIs - not on Biotin  She will switch to [facility] in June 2021 >> may need to change to LT4 + LT3.  TFTs reviewed: Lab Results  Component Value Date   TSH  1.26 09/02/2019   TSH 0.71 01/12/2019   TSH 0.81 07/17/2018   TSH 2.05 05/12/2018   TSH 9.69 (H) 03/10/2018   TSH 0.06 (L) 01/14/2018   TSH 0.45 09/03/2017   TSH 3.76 07/17/2017   TSH 3.42 06/03/2017   TSH 9.64 (H) 04/24/2017   FREET4 0.57 (L) 01/12/2019   FREET4 0.68 07/17/2018   FREET4 0.57 (L) 05/12/2018   FREET4  0.44 (L) 03/10/2018   FREET4 1.18 01/14/2018   FREET4 0.97 09/03/2017   FREET4 0.58 (L) 07/17/2017   FREET4 0.52 (L) 06/03/2017   FREET4 0.52 (L) 04/24/2017   FREET4 0.78 03/15/2017   Latest vitamin D level was normal, but at the low end of normal: Lab Results  Component Value Date   VD25OH 31.38 07/17/2018   VD25OH 31.42 01/14/2018   VD25OH 30.59 06/03/2017   VD25OH 28.16 (L) 04/24/2017   VD25OH 28.43 (L) 03/15/2017   VD25OH 28.02 (L) 05/21/2016   VD25OH 19.16 (L) 03/15/2016   VD25OH 25 (L) 05/18/2015   VD25OH 18.97 (L) 08/24/2014  She was on Ergocalciferol 50,000 IU once a week, now on vitamin D 5000 units daily.  B12 was normal at last check: Lab Results  Component Value Date   VITAMINB12 408 01/12/2019   VITAMINB12 246 07/17/2018   VITAMINB12 396 01/14/2018   VITAMINB12 530 03/15/2017   VITAMINB12 1,228 (H) 05/18/2015   VITAMINB12 237 03/07/2015   VITAMINB12 252 08/24/2014   On 1000 mcg B12 sublingual q 3 days >> now every other day.  She was taken out of work in the past for bilateral ankle pain and arch collapse.    She is on ASA for thrombocytosis.  She sees Dr. Marin Olp.  ROS: Constitutional: + weight gain/no weight loss, no fatigue, no subjective hyperthermia, no subjective hypothermia Eyes: no blurry vision, no xerophthalmia ENT: no sore throat, + see HPI Cardiovascular: no CP/no SOB/no palpitations/no leg swelling Respiratory: no cough/no SOB/no wheezing Gastrointestinal: no N/no V/no D/no C/no acid reflux Musculoskeletal: no muscle aches/no joint aches Skin: no rashes, no hair loss Neurological: no tremors/no numbness/no tingling/no dizziness  I reviewed pt's medications, allergies, PMH, social hx, family hx, and changes were documented in the history of present illness. Otherwise, unchanged from my initial visit note.   Past Medical History:  Diagnosis Date  . A-fib [facility])    history of  . Acquired flat foot   . Ankle pain    bilateral  .  Arthritis    feet ankles, and knees   . Cancer of kidney parenchyma, right ([place]) 09/08/2018  . Depression   . Diverticulosis   . Dyspnea    with exertion   . Dysrhythmia    hx of afib with hyperthroid no issues currently   . GERD (gastroesophageal reflux disease)   . H/O hiatal hernia 08/09/2018   Moderate to Large  . Hyperlipidemia   . Hypertension   . Hypothyroidism   . Iron deficiency anemia    iron deficiency last iron infusion 08/2018  . 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 Numbness    Left side of face around mouth - unknown etiology  . Numbness and tingling    left side - facial  . Palpitations    Occurred during hyperthyroid  . PONV (postoperative nausea and vomiting)   . Right renal mass 07/2018   2.1 cm solid heterogeneously enhancing mass in posterior midpole   . Sinus tachycardia by electrocardiogram 05/12/2018  . Thrombocythemia ([place])   . Thrombocytosis ([place]) 11/22/2017  .  Thyroid cancer ([place])   . Thyroid disease    hyperthyroidism  . Vitamin B 12 deficiency   . Vitamin D deficiency    Past Surgical History:  Procedure Laterality Date  . BREAST BIOPSY     age 62 - negative  . CHOLECYSTECTOMY  1992  . OPERATIVE ULTRASOUND Right 09/25/2018   Procedure: OPERATIVE ULTRASOUND;  Surgeon: Raynelle Bring, MD;  Location: [facility];  Service: Urology;  Laterality: Right;  . ROBOTIC ASSITED PARTIAL NEPHRECTOMY Right 09/25/2018   Procedure: XI ROBOTIC RIGHT  ASSITED PARTIAL NEPHRECTOMY;  Surgeon: Raynelle Bring, MD;  Location: [facility];  Service: Urology;  Laterality: Right;  . THYROIDECTOMY N/A 07/03/2013   Procedure: TOTAL THYROIDECTOMY;  Surgeon: Earnstine Regal, MD;  Location: [facility];  Service: General;  Laterality: N/A;   Social History   Socioeconomic History  . Marital status: Divorced    Spouse name: Not on file  . Number of children: 1  . Years of education: college  . Highest education level: Associate degree: occupational, Hotel manager, or vocational program   Occupational History  . Occupation: Therapist, sports  Tobacco Use  . Smoking status: Never Smoker  . Smokeless tobacco: Never Used  Substance and Sexual Activity  . Alcohol use: No    Alcohol/week: 0.0 standard drinks  . Drug use: No  . Sexual activity: Never  Other Topics Concern  . Not on file  Social History Narrative   Lives at home alone.   Right-handed.   2 cups caffeine daily.   Social Determinants of Health   Financial Resource Strain:   . Difficulty of Paying Living Expenses:   Food Insecurity:   . Worried About Charity fundraiser in the Last Year:   . Arboriculturist in the Last Year:   Transportation Needs:   . Film/video editor (Medical):   Marland Kitchen Lack of Transportation (Non-Medical):   Physical Activity:   . Days of Exercise per Week:   . Minutes of Exercise per Session:   Stress:   . Feeling of Stress :   Social Connections:   . Frequency of Communication with Friends and Family:   . Frequency of Social Gatherings with Friends and Family:   . Attends Religious Services:   . Active Member of Clubs or Organizations:   . Attends Archivist Meetings:   Marland Kitchen Marital Status:   Intimate Partner Violence:   . Fear of Current or Ex-Partner:   . Emotionally Abused:   Marland Kitchen Physically Abused:   . Sexually Abused:    Current Outpatient Medications on File Prior to Visit  Medication Sig Dispense Refill  . acetaminophen (TYLENOL) 500 MG tablet Take 500 mg by mouth every 6 (six) hours as needed for pain.    Marland Kitchen amLODipine-benazepril (LOTREL) 5-20 MG capsule Take 1 capsule by mouth at bedtime. 90 capsule 3  . ARMOUR THYROID 15 MG tablet Take every day along with a 60 mg tablet. 90 tablet 3  . ARMOUR THYROID 60 MG tablet Take every day along with the 15 mg tablet. 90 tablet 3  . aspirin 81 MG EC tablet Take 81 mg by mouth daily. Swallow whole.    Marland Kitchen FLUoxetine (PROZAC) 20 MG capsule Take 1 capsule (20 mg total) by mouth daily. Increase to 40 mg after 2-3 weeks 60 capsule 6  .  hydroxyurea (HYDREA) 500 MG capsule Take 1 capsule (500 mg total) by mouth daily. May take with food to minimize GI side effects. 30 capsule 6  .  LORazepam (ATIVAN) 1 MG tablet Take 1 tablet (1 mg total) by mouth at bedtime as needed for anxiety or sleep. 30 tablet 0  . lovastatin (MEVACOR) 20 MG tablet Take 1 tablet (20 mg total) by mouth at bedtime. 90 tablet 3   No current facility-administered medications on file prior to visit.   Allergies  Allergen Reactions  . Penicillins Shortness Of Breath    Did it involve swelling of the face/tongue/throat, SOB, or low BP? Yes Did it involve sudden or severe rash/hives, skin peeling, or any reaction on the inside of your mouth or nose? No Did you need to seek medical attention at a hospital or doctor's office? Unknown When did it last happen?9-37 years old If all above answers are "NO", may proceed with cephalosporin use.    Family History  Problem Relation Age of Onset  . Hypertension Mother   . Diabetes Mother   . Hyperlipidemia Mother   . COPD Mother   . CAD Father        MI in his 9s  . Diabetes Father   . Hyperlipidemia Father   . Hypertension Father   . Mental illness Father   . Mental illness Maternal Grandmother   . Stroke Maternal Grandmother   . Diabetes Maternal Grandmother   . Stroke Maternal Grandfather   . Heart attack Maternal Grandfather   . Congestive Heart Failure Paternal Grandmother   . Heart attack Paternal Grandfather    PE: BP 130/80   Pulse 92   Ht 5' 2.5" (1.588 m) Comment: measured today without shoes  Wt 208 lb (94.3 kg)   SpO2 96%   BMI 37.44 kg/m  Body mass index is 37.44 kg/m. Wt Readings from Last 3 Encounters:  09/28/19 208 lb (94.3 kg)  09/02/19 212 lb (96.2 kg)  08/24/19 210 lb 12 oz (95.6 kg)   Constitutional: overweight, in NAD Eyes: PERRLA, EOMI, no exophthalmos ENT: moist mucous membranes, no neck masses felt on palpation, no cervical lymphadenopathy Cardiovascular: RRR, No  MRG Respiratory: CTA B Gastrointestinal: abdomen soft, NT, ND, BS+ Musculoskeletal: no deformities, strength intact in all 4 Skin: moist, warm, no rashes Neurological: no tremor with outstretched hands, DTR normal in all 4  ASSESSMENT: 1. Thyroid cancer - Papillary Thy Ca, follicular variant  - subcm, w/o extension to capsule, lymph or blood vessel, and without neck extension - s/p total thyroidectomy in 06/2013  2. Postsurgical hypothyroidism  3. Vitamin D deficiency  4. Low vitamin B12   PLAN: 1. 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  She is stage I TNM -She denies any neck compression symptoms and I cannot palpate any abnormal neck masses -Reviewed the report of her latest thyroid ultrasound from 2017: No cancer recurrence or metastases in the neck - we discussed to repeat a U/S next year or the next  2. Post-surgical hypothyroidism - latest thyroid labs reviewed with pt >> normal last month: Lab Results  Component Value Date   TSH 1.26 09/02/2019   - she continues on Armour 75 mg daily.  She was previously on alternating 60 with 90 mg every other day but she could not tolerate it due to anxiety, palpitations, and the rash on her chest.  We did try Nature-Throid in the past which is more as needed, but this was on backorder.  She was briefly on levothyroxine 100 mcg + liothyronine 5 mcg daily but she did not feel good on  this (tired, not feeling like herself, weight gain). She may need to come off once she changes insurance to [facility] - she may need to go back to LT4 and LT3. - pt feels good on this formulation and dose. - we discussed about taking the thyroid hormone every day, with water, >30 minutes before breakfast, separated by >4 hours from acid reflux medications, calcium, iron, multivitamins. Pt. is taking it correctly. - will see her back in 1 year  3. And 4. Vitamin D deficiency and low vitamin B12 -B12 and  vitamin D level low/low normal in the past -She continues supplementation with 1000 mcg vitamin B12 (she takes it every other day rather than every day as advised) and 5000 units vitamin D daily -Reviewed latest levels from last check and these were normal  -We will repeat the levels today  Component     Latest Ref Rng & Units 09/28/2019  Vitamin B12     211 - 911 pg/mL 499  VITD     30.00 - 100.00 ng/mL 56.99  Vitamin B12 and D levels are normal.  Philemon Kingdom, MD PhD [facility] Endocrinology

## 2019-10-05 ENCOUNTER — Telehealth: Payer: Self-pay | Admitting: *Deleted

## 2019-10-05 ENCOUNTER — Inpatient Hospital Stay: Payer: 59 | Attending: Hematology & Oncology | Admitting: Hematology & Oncology

## 2019-10-05 ENCOUNTER — Inpatient Hospital Stay: Payer: 59

## 2019-10-05 ENCOUNTER — Other Ambulatory Visit: Payer: Self-pay

## 2019-10-05 ENCOUNTER — Encounter: Payer: Self-pay | Admitting: Hematology & Oncology

## 2019-10-05 ENCOUNTER — Telehealth: Payer: Self-pay | Admitting: Hematology & Oncology

## 2019-10-05 VITALS — BP 119/69 | HR 81 | Temp 97.3°F | Resp 17 | Wt 212.0 lb

## 2019-10-05 DIAGNOSIS — Z79899 Other long term (current) drug therapy: Secondary | ICD-10-CM | POA: Insufficient documentation

## 2019-10-05 DIAGNOSIS — Z85528 Personal history of other malignant neoplasm of kidney: Secondary | ICD-10-CM | POA: Diagnosis not present

## 2019-10-05 DIAGNOSIS — D473 Essential (hemorrhagic) thrombocythemia: Secondary | ICD-10-CM

## 2019-10-05 DIAGNOSIS — D75839 Thrombocytosis, unspecified: Secondary | ICD-10-CM

## 2019-10-05 DIAGNOSIS — D509 Iron deficiency anemia, unspecified: Secondary | ICD-10-CM | POA: Insufficient documentation

## 2019-10-05 DIAGNOSIS — Z905 Acquired absence of kidney: Secondary | ICD-10-CM | POA: Insufficient documentation

## 2019-10-05 DIAGNOSIS — C641 Malignant neoplasm of right kidney, except renal pelvis: Secondary | ICD-10-CM

## 2019-10-05 DIAGNOSIS — D500 Iron deficiency anemia secondary to blood loss (chronic): Secondary | ICD-10-CM

## 2019-10-05 LAB — IRON AND TIBC
Iron: 46 ug/dL (ref 41–142)
Saturation Ratios: 15 % — ABNORMAL LOW (ref 21–57)
TIBC: 319 ug/dL (ref 236–444)
UIBC: 272 ug/dL (ref 120–384)

## 2019-10-05 LAB — CMP (CANCER CENTER ONLY)
ALT: 11 U/L (ref 0–44)
AST: 12 U/L — ABNORMAL LOW (ref 15–41)
Albumin: 4 g/dL (ref 3.5–5.0)
Alkaline Phosphatase: 62 U/L (ref 38–126)
Anion gap: 7 (ref 5–15)
BUN: 17 mg/dL (ref 8–23)
CO2: 30 mmol/L (ref 22–32)
Calcium: 9.4 mg/dL (ref 8.9–10.3)
Chloride: 102 mmol/L (ref 98–111)
Creatinine: 0.74 mg/dL (ref 0.44–1.00)
GFR, Est AFR Am: >60 mL/min (ref >60)
GFR, Estimated: >60 mL/min (ref >60)
Glucose, Bld: 104 mg/dL — ABNORMAL HIGH (ref 70–99)
Potassium: 4.2 mmol/L (ref 3.5–5.1)
Sodium: 139 mmol/L (ref 135–145)
Total Bilirubin: 0.3 mg/dL (ref 0.3–1.2)
Total Protein: 7.4 g/dL (ref 6.5–8.1)

## 2019-10-05 LAB — CBC WITH DIFFERENTIAL (CANCER CENTER ONLY)
Abs Immature Granulocytes: 0.02 10*3/uL (ref 0.00–0.07)
Basophils Absolute: 0.1 10*3/uL (ref 0.0–0.1)
Basophils Relative: 1 %
Eosinophils Absolute: 0.2 10*3/uL (ref 0.0–0.5)
Eosinophils Relative: 2 %
HCT: 40.4 % (ref 36.0–46.0)
Hemoglobin: 12.8 g/dL (ref 12.0–15.0)
Immature Granulocytes: 0 %
Lymphocytes Relative: 26 %
Lymphs Abs: 2.1 10*3/uL (ref 0.7–4.0)
MCH: 26.4 pg (ref 26.0–34.0)
MCHC: 31.7 g/dL (ref 30.0–36.0)
MCV: 83.5 fL (ref 80.0–100.0)
Monocytes Absolute: 0.6 10*3/uL (ref 0.1–1.0)
Monocytes Relative: 8 %
Neutro Abs: 5.2 10*3/uL (ref 1.7–7.7)
Neutrophils Relative %: 63 %
Platelet Count: 582 10*3/uL — ABNORMAL HIGH (ref 150–400)
RBC: 4.84 MIL/uL (ref 3.87–5.11)
RDW: 15.4 % (ref 11.5–15.5)
WBC Count: 8.2 10*3/uL (ref 4.0–10.5)
nRBC: 0 % (ref 0.0–0.2)

## 2019-10-05 LAB — FERRITIN: Ferritin: 109 ng/mL (ref 11–307)

## 2019-10-05 LAB — LACTATE DEHYDROGENASE: LDH: 146 U/L (ref 98–192)

## 2019-10-05 LAB — SAVE SMEAR(SSMR), FOR PROVIDER SLIDE REVIEW

## 2019-10-05 MED FILL — HYDROXYUREA 500 MG CAPSULE: 500 | 30 days supply | Qty: 30 | Fill #0

## 2019-10-05 NOTE — Telephone Encounter (Signed)
Appointments scheduled calendar declined due to My Chart per 3/22 los

## 2019-10-05 NOTE — Progress Notes (Signed)
Hematology and Oncology Follow Up Visit  Karen Wall [id number] 19-Oct-1955 64 y.o. 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  She has not yet started the Hydrea.  We last saw her, her iron was a little bit low.  She got some IV iron.  Unfortunately, this is not improved her platelet count.  Platelet count today is 582,000.  I think we really have to start the Hydrea.  Is just 500 mg daily.  I really think that she will do okay with this.  She has had no other complaints.  She had a CT scan of the abdomen pelvis done by her urologist.  This is to follow-up with the kidney cancer.  She had this resected a year ago.  She is doing well.  She had nothing on the CT scan that looked suspicious.  She had a 7 x 6 mm lesion of the right mid kidney which probably is a cyst.  She has had no fever.  There is been no cough.  She has had no chest wall pain.  There is been no change in bowel or bladder habits.  Overall, her performance status is ECOG 1.  Medications:  Current Outpatient Medications:  .  acetaminophen (TYLENOL) 500 MG tablet, Take 500 mg by mouth every 6 (six) hours as needed for pain., Disp: , Rfl:  .  amLODipine-benazepril (LOTREL) 5-20 MG capsule, Take 1 capsule by mouth at bedtime., Disp: 90 capsule, Rfl: 1 .  LORazepam (ATIVAN) 1 MG tablet, Take 1 tablet (1 mg total) by mouth at bedtime as needed for anxiety or sleep., Disp: 30 tablet, Rfl: 0 .  lovastatin (MEVACOR) 20 MG tablet, Take 1 tablet (20 mg total) by mouth at bedtime., Disp: 90 tablet, Rfl: 3 .   thyroid (ARMOUR THYROID) 15 MG tablet, Take 1 tablet (15 mg total) by mouth daily. Along with the 60 mg tablet. (Patient taking differently: Take 15 mg by mouth daily before breakfast. Along with the 60 mg tablet.), Disp: 90 tablet, Rfl: 3 .  thyroid (ARMOUR THYROID) 60 MG tablet, Take every day along with the 15 mg tablet. (Patient taking differently: Take 60 mg by mouth daily before breakfast. Take every day along with the 15 mg tablet.), Disp: 90 tablet, Rfl: 3 .  traMADol (ULTRAM) 50 MG tablet, Take 1-2 tablets (50-100 mg total) by mouth every 6 (six) hours as needed for moderate pain or severe pain., Disp: 20 tablet, Rfl: 0  Allergies:       Allergies  Allergen Reactions  . Penicillins Shortness Of Breath    Did it involve swelling of the face/tongue/throat, SOB, or low BP? Yes Did it involve sudden or severe rash/hives, skin peeling, or any reaction on the inside of your mouth or nose? No Did you need to seek medical attention at a hospital or doctor's  office? Unknown When did it last happen?31-83 years old If all above answers are "NO", may proceed with cephalosporin use.     Past Medical History, Surgical history, Social history, and Family History were reviewed and updated.  Review of Systems: Review of Systems  Constitutional: Negative.   HENT:  Negative.   Eyes: Negative.   Respiratory: Negative.   Cardiovascular: Negative.   Gastrointestinal: Negative.   Endocrine: Negative.   Genitourinary: Negative.    Musculoskeletal: Negative.   Skin: Negative.   Neurological: Negative.   Hematological: Negative.   Psychiatric/Behavioral: Negative.     Physical Exam:  vitals were not taken for this visit.      Wt Readings from Last 3 Encounters:  09/25/18 213 lb (96.6 kg)  09/23/18 213 lb (96.6 kg)  09/08/18 198 lb (89.8 kg)    Physical Exam Vitals signs reviewed.  HENT:     Head: Normocephalic and atraumatic.  Eyes:     Pupils: Pupils are equal,  round, and reactive to light.  Neck:     Musculoskeletal: Normal range of motion.  Cardiovascular:     Rate and Rhythm: Normal rate and regular rhythm.     Heart sounds: Normal heart sounds.  Pulmonary:     Effort: Pulmonary effort is normal.     Breath sounds: Normal breath sounds.  Abdominal:     General: Bowel sounds are normal.     Palpations: Abdomen is soft.  Musculoskeletal: Normal range of motion.        General: No tenderness or deformity.  Lymphadenopathy:     Cervical: No cervical adenopathy.  Skin:    General: Skin is warm and dry.     Findings: No erythema or rash.  Neurological:     Mental Status: She is alert and oriented to person, place, and time.  Psychiatric:        Behavior: Behavior normal.        Thought Content: Thought content normal.        Judgment: Judgment normal.      RecentLabs       Lab Results  Component Value Date   WBC 7.3 09/23/2018   HGB 12.0 09/26/2018   HCT 39.4 09/26/2018   MCV 87.6 09/23/2018   PLT 479 (H) 09/23/2018       Chemistry   Labs(Brief)          Component Value Date/Time   NA 137 09/26/2018 0339   K 4.0 09/26/2018 0339   CL 105 09/26/2018 0339   CO2 24 09/26/2018 0339   BUN 9 09/26/2018 0339   CREATININE 0.70 09/26/2018 0339   CREATININE 0.81 09/08/2018 1148   CREATININE 0.74 05/18/2015 1356     Labs(Brief)          Component Value Date/Time   CALCIUM 8.6 (L) 09/26/2018 0339   ALKPHOS 52 09/08/2018 1148   AST 13 (L) 09/08/2018 1148   ALT 13 09/08/2018 1148   BILITOT 0.2 (L) 09/08/2018 1148        Impression and Plan: Karen Wall is a 64 year old white female.    For right now, we have to focus on the thrombocythemia.  I would suspect that the Hydrea will help [place] out.  Thankfully, this renal malignancy is not going to be a problem for her.  I think that overall the chance of this kidney cancer coming back is going to be less then 5%.   I would like to see her back in  6 weeks  for follow-up.   [name], MD 6/11/202012:28 PM

## 2019-10-05 NOTE — Telephone Encounter (Signed)
-----   Message from [name], MD sent at 10/05/2019  3:26 PM EDT ----- Call - the iron is still low!!!  Let's try more IV iron.  Hold on the Hydrea for now!!

## 2019-10-05 NOTE — Telephone Encounter (Signed)
Message left on pt.'s private voicemail to notify her per order of Dr. Marin Olp that "the iron is still low!!  Let's try more IV iron.  Hold on the Hydrea for now!!"  Instructed pt to call office back with any questions or concerns.  Message sent to scheduling.

## 2019-10-05 NOTE — Addendum Note (Signed)
Addended by: Burney Gauze R on: 10/05/2019 04:01 PM   Modules accepted: Orders

## 2019-10-07 ENCOUNTER — Inpatient Hospital Stay: Payer: 59

## 2019-10-07 ENCOUNTER — Telehealth: Payer: Self-pay | Admitting: Hematology & Oncology

## 2019-10-07 ENCOUNTER — Other Ambulatory Visit: Payer: Self-pay

## 2019-10-07 VITALS — BP 105/64 | HR 66 | Temp 97.0°F | Resp 18

## 2019-10-07 DIAGNOSIS — D509 Iron deficiency anemia, unspecified: Secondary | ICD-10-CM | POA: Diagnosis not present

## 2019-10-07 DIAGNOSIS — Z905 Acquired absence of kidney: Secondary | ICD-10-CM | POA: Diagnosis not present

## 2019-10-07 DIAGNOSIS — C641 Malignant neoplasm of right kidney, except renal pelvis: Secondary | ICD-10-CM

## 2019-10-07 DIAGNOSIS — Z85528 Personal history of other malignant neoplasm of kidney: Secondary | ICD-10-CM | POA: Diagnosis not present

## 2019-10-07 DIAGNOSIS — D473 Essential (hemorrhagic) thrombocythemia: Secondary | ICD-10-CM | POA: Diagnosis not present

## 2019-10-07 DIAGNOSIS — D508 Other iron deficiency anemias: Secondary | ICD-10-CM

## 2019-10-07 DIAGNOSIS — Z79899 Other long term (current) drug therapy: Secondary | ICD-10-CM | POA: Diagnosis not present

## 2019-10-07 MED ORDER — SODIUM CHLORIDE 0.9 % IV SOLN
Freq: Once | INTRAVENOUS | Status: AC
  Filled 2019-10-07: qty 250

## 2019-10-07 MED ORDER — SODIUM CHLORIDE 0.9 % IV SOLN
200.0000 mg | Freq: Once | INTRAVENOUS | Status: AC
  Administered 2019-10-07: 200 mg via INTRAVENOUS
  Filled 2019-10-07: qty 10

## 2019-10-07 NOTE — Patient Instructions (Signed)

## 2019-10-07 NOTE — Telephone Encounter (Signed)
Faxed medical records to: [facility] [place] F: [id number]   for  Karen Wall 02-05-1956 CLAIM: [id number]

## 2019-10-12 ENCOUNTER — Inpatient Hospital Stay: Payer: 59

## 2019-10-12 ENCOUNTER — Other Ambulatory Visit: Payer: Self-pay

## 2019-10-12 VITALS — BP 111/58 | HR 60 | Temp 97.3°F | Resp 16

## 2019-10-12 DIAGNOSIS — C641 Malignant neoplasm of right kidney, except renal pelvis: Secondary | ICD-10-CM

## 2019-10-12 DIAGNOSIS — Z85528 Personal history of other malignant neoplasm of kidney: Secondary | ICD-10-CM | POA: Diagnosis not present

## 2019-10-12 DIAGNOSIS — Z905 Acquired absence of kidney: Secondary | ICD-10-CM | POA: Diagnosis not present

## 2019-10-12 DIAGNOSIS — D508 Other iron deficiency anemias: Secondary | ICD-10-CM

## 2019-10-12 DIAGNOSIS — D473 Essential (hemorrhagic) thrombocythemia: Secondary | ICD-10-CM | POA: Diagnosis not present

## 2019-10-12 DIAGNOSIS — D509 Iron deficiency anemia, unspecified: Secondary | ICD-10-CM | POA: Diagnosis not present

## 2019-10-12 DIAGNOSIS — Z79899 Other long term (current) drug therapy: Secondary | ICD-10-CM | POA: Diagnosis not present

## 2019-10-12 MED ORDER — SODIUM CHLORIDE 0.9 % IV SOLN
200.0000 mg | Freq: Once | INTRAVENOUS | Status: AC
  Administered 2019-10-12: 200 mg via INTRAVENOUS
  Filled 2019-10-12: qty 10

## 2019-10-12 MED ORDER — SODIUM CHLORIDE 0.9 % IV SOLN
Freq: Once | INTRAVENOUS | Status: AC
  Filled 2019-10-12: qty 250

## 2019-10-12 NOTE — Patient Instructions (Signed)

## 2019-10-13 DIAGNOSIS — Z23 Encounter for immunization: Secondary | ICD-10-CM | POA: Diagnosis not present

## 2019-10-15 ENCOUNTER — Other Ambulatory Visit: Payer: Self-pay

## 2019-10-15 ENCOUNTER — Inpatient Hospital Stay: Payer: 59 | Attending: Hematology & Oncology

## 2019-10-15 VITALS — BP 114/58 | HR 63 | Temp 96.7°F | Resp 18

## 2019-10-15 DIAGNOSIS — D509 Iron deficiency anemia, unspecified: Secondary | ICD-10-CM | POA: Insufficient documentation

## 2019-10-15 DIAGNOSIS — Z79899 Other long term (current) drug therapy: Secondary | ICD-10-CM | POA: Insufficient documentation

## 2019-10-15 DIAGNOSIS — C641 Malignant neoplasm of right kidney, except renal pelvis: Secondary | ICD-10-CM

## 2019-10-15 DIAGNOSIS — D508 Other iron deficiency anemias: Secondary | ICD-10-CM

## 2019-10-15 MED ORDER — SODIUM CHLORIDE 0.9 % IV SOLN
200.0000 mg | Freq: Once | INTRAVENOUS | Status: AC
  Administered 2019-10-15: 200 mg via INTRAVENOUS
  Filled 2019-10-15: qty 200

## 2019-10-15 MED ORDER — SODIUM CHLORIDE 0.9 % IV SOLN
Freq: Once | INTRAVENOUS | Status: AC
  Filled 2019-10-15: qty 250

## 2019-10-15 MED FILL — ARMOUR THYROID 15 MG TABLET: 15 | 90 days supply | Qty: 90 | Fill #3

## 2019-10-15 MED FILL — ARMOUR THYROID 60 MG TABLET: 60 | 90 days supply | Qty: 90 | Fill #3

## 2019-10-15 MED FILL — FLUoxetine HCL 20 MG CAPS: 20 | 30 days supply | Qty: 60 | Fill #2

## 2019-10-15 NOTE — Patient Instructions (Signed)

## 2019-10-19 ENCOUNTER — Other Ambulatory Visit: Payer: Self-pay

## 2019-10-19 ENCOUNTER — Inpatient Hospital Stay: Payer: 59

## 2019-10-19 VITALS — BP 110/66 | HR 64 | Temp 97.5°F | Resp 18

## 2019-10-19 DIAGNOSIS — Z79899 Other long term (current) drug therapy: Secondary | ICD-10-CM | POA: Diagnosis not present

## 2019-10-19 DIAGNOSIS — C641 Malignant neoplasm of right kidney, except renal pelvis: Secondary | ICD-10-CM

## 2019-10-19 DIAGNOSIS — D509 Iron deficiency anemia, unspecified: Secondary | ICD-10-CM | POA: Diagnosis not present

## 2019-10-19 DIAGNOSIS — D508 Other iron deficiency anemias: Secondary | ICD-10-CM

## 2019-10-19 MED ORDER — SODIUM CHLORIDE 0.9 % IV SOLN
200.0000 mg | Freq: Once | INTRAVENOUS | Status: AC
  Administered 2019-10-19: 200 mg via INTRAVENOUS
  Filled 2019-10-19: qty 10

## 2019-10-19 MED ORDER — SODIUM CHLORIDE 0.9 % IV SOLN
Freq: Once | INTRAVENOUS | Status: AC
  Filled 2019-10-19: qty 250

## 2019-10-19 NOTE — Patient Instructions (Signed)

## 2019-10-19 NOTE — Progress Notes (Signed)
Pt refused to stay x30 observation. Discharged per pt request.

## 2019-10-20 ENCOUNTER — Encounter: Payer: Self-pay | Admitting: Family Medicine

## 2019-10-22 ENCOUNTER — Inpatient Hospital Stay: Payer: 59

## 2019-10-22 ENCOUNTER — Other Ambulatory Visit: Payer: Self-pay | Admitting: Family

## 2019-10-22 ENCOUNTER — Other Ambulatory Visit: Payer: Self-pay

## 2019-10-22 VITALS — BP 114/75 | HR 76 | Temp 97.5°F | Resp 18

## 2019-10-22 DIAGNOSIS — D508 Other iron deficiency anemias: Secondary | ICD-10-CM

## 2019-10-22 DIAGNOSIS — C641 Malignant neoplasm of right kidney, except renal pelvis: Secondary | ICD-10-CM

## 2019-10-22 DIAGNOSIS — D509 Iron deficiency anemia, unspecified: Secondary | ICD-10-CM | POA: Diagnosis not present

## 2019-10-22 DIAGNOSIS — Z79899 Other long term (current) drug therapy: Secondary | ICD-10-CM | POA: Diagnosis not present

## 2019-10-22 MED ORDER — SODIUM CHLORIDE 0.9 % IV SOLN
200.0000 mg | Freq: Once | INTRAVENOUS | Status: AC
  Administered 2019-10-22: 200 mg via INTRAVENOUS
  Filled 2019-10-22: qty 200

## 2019-10-22 MED ORDER — SODIUM CHLORIDE 0.9 % IV SOLN
Freq: Once | INTRAVENOUS | Status: AC
  Filled 2019-10-22: qty 250

## 2019-10-22 NOTE — Patient Instructions (Signed)

## 2019-10-28 DIAGNOSIS — M79671 Pain in right foot: Secondary | ICD-10-CM | POA: Diagnosis not present

## 2019-10-28 DIAGNOSIS — M79672 Pain in left foot: Secondary | ICD-10-CM | POA: Diagnosis not present

## 2019-11-05 ENCOUNTER — Telehealth: Payer: Self-pay | Admitting: Family Medicine

## 2019-11-05 NOTE — Telephone Encounter (Signed)
Called and spoke with the insurance/ disability rep and answered questions to the best of my ability

## 2019-11-05 NOTE — Telephone Encounter (Signed)
Tried speaking with him regarding long term disability-however he told me he would only be able to speak with you and asked that you call his phone at [phone]

## 2019-11-05 NOTE — Telephone Encounter (Signed)
Caller: Dr Daneil Dolin [facility]  Call Back# [phone]  Subject: Disability Paper Work   Dr Daneil Dolin is requesting a call back from provider.

## 2019-11-12 ENCOUNTER — Encounter: Payer: Self-pay | Admitting: Family Medicine

## 2019-11-12 NOTE — Progress Notes (Signed)
have not received any notice of a change in my status. I'm not sure what paperwork they are asking you for. I have difficulty sitting in an upright position for extended period because of the degenerative issues in my thoracic back and shoulders.  I spend most of my time in a recliner with my feet up and even then have to change positions frequently to not ache. Beyond that - I do not have the energy to go back to work. I don't know why. I thought it was my thyroid meds were not right for years but DrGerhge is satisfied with those. Then they found they the low iron and I thought that would fix it.  My 5th out of 5 infusions is tomorrow but so far no change and historically no change. Maybe the essential thrombosis makes me tired.Depression maybe- insomnia maybe- I don't know but I am tired when I get up and drag through the day. I do not feel I can hold a job of any sort.

## 2019-11-16 ENCOUNTER — Other Ambulatory Visit: Payer: Self-pay

## 2019-11-16 ENCOUNTER — Inpatient Hospital Stay: Payer: 59 | Attending: Hematology & Oncology

## 2019-11-16 ENCOUNTER — Inpatient Hospital Stay (HOSPITAL_BASED_OUTPATIENT_CLINIC_OR_DEPARTMENT_OTHER): Payer: 59 | Admitting: Hematology & Oncology

## 2019-11-16 ENCOUNTER — Telehealth: Payer: Self-pay | Admitting: Hematology & Oncology

## 2019-11-16 ENCOUNTER — Encounter: Payer: Self-pay | Admitting: *Deleted

## 2019-11-16 VITALS — BP 100/66 | HR 78 | Temp 97.1°F | Resp 19 | Wt 209.0 lb

## 2019-11-16 DIAGNOSIS — K909 Intestinal malabsorption, unspecified: Secondary | ICD-10-CM | POA: Diagnosis not present

## 2019-11-16 DIAGNOSIS — Z79899 Other long term (current) drug therapy: Secondary | ICD-10-CM | POA: Insufficient documentation

## 2019-11-16 DIAGNOSIS — D509 Iron deficiency anemia, unspecified: Secondary | ICD-10-CM | POA: Diagnosis not present

## 2019-11-16 DIAGNOSIS — D473 Essential (hemorrhagic) thrombocythemia: Secondary | ICD-10-CM

## 2019-11-16 DIAGNOSIS — D75839 Thrombocytosis, unspecified: Secondary | ICD-10-CM

## 2019-11-16 DIAGNOSIS — C641 Malignant neoplasm of right kidney, except renal pelvis: Secondary | ICD-10-CM

## 2019-11-16 LAB — CBC WITH DIFFERENTIAL (CANCER CENTER ONLY)
Abs Immature Granulocytes: 0.02 10*3/uL (ref 0.00–0.07)
Basophils Absolute: 0 10*3/uL (ref 0.0–0.1)
Basophils Relative: 0 %
Eosinophils Absolute: 0.1 10*3/uL (ref 0.0–0.5)
Eosinophils Relative: 1 %
HCT: 43.3 % (ref 36.0–46.0)
Hemoglobin: 13.5 g/dL (ref 12.0–15.0)
Immature Granulocytes: 0 %
Lymphocytes Relative: 31 %
Lymphs Abs: 2.7 10*3/uL (ref 0.7–4.0)
MCH: 26.5 pg (ref 26.0–34.0)
MCHC: 31.2 g/dL (ref 30.0–36.0)
MCV: 85.1 fL (ref 80.0–100.0)
Monocytes Absolute: 0.8 10*3/uL (ref 0.1–1.0)
Monocytes Relative: 9 %
Neutro Abs: 5.1 10*3/uL (ref 1.7–7.7)
Neutrophils Relative %: 59 %
Platelet Count: 503 10*3/uL — ABNORMAL HIGH (ref 150–400)
RBC: 5.09 MIL/uL (ref 3.87–5.11)
RDW: 16.6 % — ABNORMAL HIGH (ref 11.5–15.5)
WBC Count: 8.7 10*3/uL (ref 4.0–10.5)
nRBC: 0 % (ref 0.0–0.2)

## 2019-11-16 LAB — CMP (CANCER CENTER ONLY)
ALT: 12 U/L (ref 0–44)
AST: 14 U/L — ABNORMAL LOW (ref 15–41)
Albumin: 4.1 g/dL (ref 3.5–5.0)
Alkaline Phosphatase: 55 U/L (ref 38–126)
Anion gap: 8 (ref 5–15)
BUN: 17 mg/dL (ref 8–23)
CO2: 30 mmol/L (ref 22–32)
Calcium: 9.9 mg/dL (ref 8.9–10.3)
Chloride: 102 mmol/L (ref 98–111)
Creatinine: 0.77 mg/dL (ref 0.44–1.00)
GFR, Est AFR Am: >60 mL/min (ref >60)
GFR, Estimated: >60 mL/min (ref >60)
Glucose, Bld: 104 mg/dL — ABNORMAL HIGH (ref 70–99)
Potassium: 4.7 mmol/L (ref 3.5–5.1)
Sodium: 140 mmol/L (ref 135–145)
Total Bilirubin: 0.3 mg/dL (ref 0.3–1.2)
Total Protein: 7.4 g/dL (ref 6.5–8.1)

## 2019-11-16 LAB — IRON AND TIBC
Iron: 64 ug/dL (ref 41–142)
Saturation Ratios: 22 % (ref 21–57)
TIBC: 291 ug/dL (ref 236–444)
UIBC: 227 ug/dL (ref 120–384)

## 2019-11-16 LAB — SAVE SMEAR(SSMR), FOR PROVIDER SLIDE REVIEW

## 2019-11-16 LAB — FERRITIN: Ferritin: 585 ng/mL — ABNORMAL HIGH (ref 11–307)

## 2019-11-16 LAB — LACTATE DEHYDROGENASE: LDH: 140 U/L (ref 98–192)

## 2019-11-16 NOTE — Telephone Encounter (Signed)
Appointments scheduled calendar declined due to My Chart access per 5/3 los

## 2019-11-16 NOTE — Progress Notes (Signed)
Hematology and Oncology Follow Up Visit  Karen Wall [id number] 08/28/1955 64 y.o. 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  She has not yet started the Hydrea.  When we last saw her, her iron levels were still low.  Her iron saturation was only 15%.  We did go and give her a dose of IV iron.  Because this, we just have held off on the Hydrea.  Otherwise, she is doing pretty well.  She has had no problems with increasing fatigue or weakness.  There is been no change in bowel or bladder habits.  She does have bad ankles.  She has braces on both ankles.  She has had no fever.  She has had her coronavirus vaccines.  There is been no nausea or vomiting.  She has had no chest wall pain.  She has had no headache.  She goes back to see the urologist in August about the renal cell carcinoma for follow-up.  Overall, her performance status is ECOG 1.    Medications:  Current Outpatient Medications:  .  acetaminophen (TYLENOL) 500 MG tablet, Take 500 mg by mouth every 6 (six) hours as needed for pain., Disp: , Rfl:  .  amLODipine-benazepril (LOTREL) 5-20 MG capsule, Take 1 capsule by mouth at bedtime., Disp: 90 capsule, Rfl: 1 .  LORazepam (ATIVAN) 1 MG tablet, Take 1 tablet (1 mg total) by mouth at bedtime as needed for anxiety or sleep., Disp: 30 tablet, Rfl: 0 .  lovastatin (MEVACOR) 20 MG tablet, Take 1 tablet (20 mg total) by mouth at bedtime., Disp: 90 tablet, Rfl: 3 .  thyroid (ARMOUR THYROID) 15 MG tablet, Take 1 tablet (15 mg total) by mouth daily. Along with the 60  mg tablet. (Patient taking differently: Take 15 mg by mouth daily before breakfast. Along with the 60 mg tablet.), Disp: 90 tablet, Rfl: 3 .  thyroid (ARMOUR THYROID) 60 MG tablet, Take every day along with the 15 mg tablet. (Patient taking differently: Take 60 mg by mouth daily before breakfast. Take every day along with the 15 mg tablet.), Disp: 90 tablet, Rfl: 3 .  traMADol (ULTRAM) 50 MG tablet, Take 1-2 tablets (50-100 mg total) by mouth every 6 (six) hours as needed for moderate pain or severe pain., Disp: 20 tablet, Rfl: 0  Allergies:       Allergies  Allergen Reactions  . Penicillins Shortness Of Breath    Did it involve swelling of the face/tongue/throat, SOB, or low BP? Yes Did it involve sudden or severe rash/hives, skin peeling, or any reaction on the inside of your mouth or nose? No Did you need to seek medical attention at a hospital or doctor's office? Unknown When did it last happen?5-22 years old If all above answers are "NO", may proceed with cephalosporin use.     Past Medical History,  Surgical history, Social history, and Family History were reviewed and updated.  Review of Systems: Review of Systems  Constitutional: Negative.   HENT:  Negative.   Eyes: Negative.   Respiratory: Negative.   Cardiovascular: Negative.   Gastrointestinal: Negative.   Endocrine: Negative.   Genitourinary: Negative.    Musculoskeletal: Negative.   Skin: Negative.   Neurological: Negative.   Hematological: Negative.   Psychiatric/Behavioral: Negative.     Physical Exam: Vital signs this visit show temperature of 97.1.  Pulse is 78.  Blood pressure 100/66.  Weight is 209 pounds.     Wt Readings from Last 3 Encounters:  09/25/18 213 lb (96.6 kg)  09/23/18 213 lb (96.6 kg)  09/08/18 198 lb (89.8 kg)    Physical Exam Vitals signs reviewed.  HENT:     Head: Normocephalic and atraumatic.  Eyes:     Pupils: Pupils are equal, round, and reactive to light.   Neck:     Musculoskeletal: Normal range of motion.  Cardiovascular:     Rate and Rhythm: Normal rate and regular rhythm.     Heart sounds: Normal heart sounds.  Pulmonary:     Effort: Pulmonary effort is normal.     Breath sounds: Normal breath sounds.  Abdominal:     General: Bowel sounds are normal.     Palpations: Abdomen is soft.  Musculoskeletal: Normal range of motion.        General: No tenderness or deformity.  Lymphadenopathy:     Cervical: No cervical adenopathy.  Skin:    General: Skin is warm and dry.     Findings: No erythema or rash.  Neurological:     Mental Status: She is alert and oriented to person, place, and time.  Psychiatric:        Behavior: Behavior normal.        Thought Content: Thought content normal.        Judgment: Judgment normal.      RecentLabs       Lab Results  Component Value Date   WBC 7.3 09/23/2018   HGB 12.0 09/26/2018   HCT 39.4 09/26/2018   MCV 87.6 09/23/2018   PLT 479 (H) 09/23/2018       Chemistry   Labs(Brief)          Component Value Date/Time   NA 137 09/26/2018 0339   K 4.0 09/26/2018 0339   CL 105 09/26/2018 0339   CO2 24 09/26/2018 0339   BUN 9 09/26/2018 0339   CREATININE 0.70 09/26/2018 0339   CREATININE 0.81 09/08/2018 1148   CREATININE 0.74 05/18/2015 1356     Labs(Brief)          Component Value Date/Time   CALCIUM 8.6 (L) 09/26/2018 0339   ALKPHOS 52 09/08/2018 1148   AST 13 (L) 09/08/2018 1148   ALT 13 09/08/2018 1148   BILITOT 0.2 (L) 09/08/2018 1148        Impression and Plan: Karen Wall is a 64 year old white female.    Hopefully, her iron levels will be okay.  We still can start the Hydrea if her platelet count does go up.  I think that for now, we can hold off on the Hydrea.  She really does not like taking medications.  We will plan to get her back to see [place] in another 6 weeks.  [name], MD 6/11/202012:28 PM

## 2019-12-28 ENCOUNTER — Other Ambulatory Visit: Payer: Self-pay

## 2019-12-28 ENCOUNTER — Encounter: Payer: Self-pay | Admitting: Hematology & Oncology

## 2019-12-28 ENCOUNTER — Inpatient Hospital Stay (HOSPITAL_BASED_OUTPATIENT_CLINIC_OR_DEPARTMENT_OTHER): Payer: Medicare HMO | Admitting: Hematology & Oncology

## 2019-12-28 ENCOUNTER — Inpatient Hospital Stay: Payer: Medicare HMO | Attending: Hematology & Oncology

## 2019-12-28 VITALS — BP 98/59 | HR 83 | Temp 97.1°F | Resp 18 | Wt 199.0 lb

## 2019-12-28 DIAGNOSIS — D473 Essential (hemorrhagic) thrombocythemia: Secondary | ICD-10-CM | POA: Diagnosis not present

## 2019-12-28 DIAGNOSIS — C641 Malignant neoplasm of right kidney, except renal pelvis: Secondary | ICD-10-CM | POA: Diagnosis not present

## 2019-12-28 DIAGNOSIS — Z79899 Other long term (current) drug therapy: Secondary | ICD-10-CM | POA: Insufficient documentation

## 2019-12-28 DIAGNOSIS — D509 Iron deficiency anemia, unspecified: Secondary | ICD-10-CM | POA: Insufficient documentation

## 2019-12-28 DIAGNOSIS — D75839 Thrombocytosis, unspecified: Secondary | ICD-10-CM

## 2019-12-28 DIAGNOSIS — K909 Intestinal malabsorption, unspecified: Secondary | ICD-10-CM

## 2019-12-28 LAB — CBC WITH DIFFERENTIAL (CANCER CENTER ONLY)
Abs Immature Granulocytes: 0.02 10*3/uL (ref 0.00–0.07)
Basophils Absolute: 0 10*3/uL (ref 0.0–0.1)
Basophils Relative: 0 %
Eosinophils Absolute: 0.4 10*3/uL (ref 0.0–0.5)
Eosinophils Relative: 4 %
HCT: 46.1 % — ABNORMAL HIGH (ref 36.0–46.0)
Hemoglobin: 14.7 g/dL (ref 12.0–15.0)
Immature Granulocytes: 0 %
Lymphocytes Relative: 31 %
Lymphs Abs: 2.9 10*3/uL (ref 0.7–4.0)
MCH: 27.6 pg (ref 26.0–34.0)
MCHC: 31.9 g/dL (ref 30.0–36.0)
MCV: 86.5 fL (ref 80.0–100.0)
Monocytes Absolute: 0.9 10*3/uL (ref 0.1–1.0)
Monocytes Relative: 10 %
Neutro Abs: 5 10*3/uL (ref 1.7–7.7)
Neutrophils Relative %: 55 %
Platelet Count: 441 10*3/uL — ABNORMAL HIGH (ref 150–400)
RBC: 5.33 MIL/uL — ABNORMAL HIGH (ref 3.87–5.11)
RDW: 15.2 % (ref 11.5–15.5)
WBC Count: 9.2 10*3/uL (ref 4.0–10.5)
nRBC: 0 % (ref 0.0–0.2)

## 2019-12-28 LAB — CMP (CANCER CENTER ONLY)
ALT: 14 U/L (ref 0–44)
AST: 16 U/L (ref 15–41)
Albumin: 4.2 g/dL (ref 3.5–5.0)
Alkaline Phosphatase: 46 U/L (ref 38–126)
Anion gap: 9 (ref 5–15)
BUN: 15 mg/dL (ref 8–23)
CO2: 27 mmol/L (ref 22–32)
Calcium: 9.6 mg/dL (ref 8.9–10.3)
Chloride: 103 mmol/L (ref 98–111)
Creatinine: 0.78 mg/dL (ref 0.44–1.00)
GFR, Est AFR Am: >60 mL/min (ref >60)
GFR, Estimated: >60 mL/min (ref >60)
Glucose, Bld: 95 mg/dL (ref 70–99)
Potassium: 4.4 mmol/L (ref 3.5–5.1)
Sodium: 139 mmol/L (ref 135–145)
Total Bilirubin: 0.3 mg/dL (ref 0.3–1.2)
Total Protein: 7.2 g/dL (ref 6.5–8.1)

## 2019-12-28 NOTE — Progress Notes (Signed)
Hematology and Oncology Follow Up Visit  Karen Wall [id number] [birth year] 64 y.o. 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  She really looks great.  She is losing weight.  She is on special program to lose weight.  She had a good weekend.  She did not work outside in the yard.  Hopefully, she will go to the beach in July.  She has had no problems from the kidney surgery.  She had a partial nephrectomy of the right kidney about 15 months ago.  I must say that her plate count keeps coming down.  I had to believe that this is more from iron deficiency then from a primary bone marrow disorder.  We saw her back in May, her ferritin was 585 with an iron saturation of 22%.  Now that her iron is coming up, her hemoglobin is also coming up.  Overall, her performance status is ECOG 1.    Medications:  Current Outpatient Medications:  .  acetaminophen (TYLENOL) 500 MG tablet, Take 500 mg by mouth every 6 (six) hours as needed for pain., Disp: , Rfl:  .  amLODipine-benazepril (LOTREL) 5-20 MG capsule, Take 1 capsule by mouth at bedtime., Disp: 90 capsule, Rfl: 1 .  LORazepam (ATIVAN) 1 MG tablet, Take 1 tablet (1 mg total) by mouth at bedtime as needed for anxiety or sleep., Disp: 30 tablet, Rfl: 0 .  lovastatin (MEVACOR) 20 MG tablet, Take 1 tablet (20 mg total) by mouth at bedtime., Disp: 90 tablet, Rfl: 3 .  thyroid (ARMOUR THYROID) 15 MG tablet, Take 1 tablet (15 mg total) by mouth daily. Along with the 60 mg tablet. (Patient taking differently: Take 15 mg by mouth daily  before breakfast. Along with the 60 mg tablet.), Disp: 90 tablet, Rfl: 3 .  thyroid (ARMOUR THYROID) 60 MG tablet, Take every day along with the 15 mg tablet. (Patient taking differently: Take 60 mg by mouth daily before breakfast. Take every day along with the 15 mg tablet.), Disp: 90 tablet, Rfl: 3 .  traMADol (ULTRAM) 50 MG tablet, Take 1-2 tablets (50-100 mg total) by mouth every 6 (six) hours as needed for moderate pain or severe pain., Disp: 20 tablet, Rfl: 0  Allergies:       Allergies  Allergen Reactions  . Penicillins Shortness Of Breath    Did it involve swelling of the face/tongue/throat, SOB, or low BP? Yes Did it involve sudden or severe rash/hives, skin peeling, or any reaction on the inside of your mouth or nose? No Did you need to seek medical attention at a hospital or doctor's office? Unknown When did it last happen?103-64 years old If all above answers are "NO", may proceed with cephalosporin use.     Past Medical History, Surgical history, Social history, and Family History were reviewed and updated.  Review of Systems:  Review of Systems  Constitutional: Negative.   HENT:  Negative.   Eyes: Negative.   Respiratory: Negative.   Cardiovascular: Negative.   Gastrointestinal: Negative.   Endocrine: Negative.   Genitourinary: Negative.    Musculoskeletal: Negative.   Skin: Negative.   Neurological: Negative.   Hematological: Negative.   Psychiatric/Behavioral: Negative.     Physical Exam: Vital signs this visit show temperature of 97.1.  Pulse is 78.  Blood pressure 100/66.  Weight is 209 pounds.     Wt Readings from Last 3 Encounters:  09/25/18 213 lb (96.6 kg)  09/23/18 213 lb (96.6 kg)  09/08/18 198 lb (89.8 kg)    Physical Exam Vitals signs reviewed.  HENT:     Head: Normocephalic and atraumatic.  Eyes:     Pupils: Pupils are equal, round, and reactive to light.  Neck:     Musculoskeletal: Normal range of motion.  Cardiovascular:      Rate and Rhythm: Normal rate and regular rhythm.     Heart sounds: Normal heart sounds.  Pulmonary:     Effort: Pulmonary effort is normal.     Breath sounds: Normal breath sounds.  Abdominal:     General: Bowel sounds are normal.     Palpations: Abdomen is soft.  Musculoskeletal: Normal range of motion.        General: No tenderness or deformity.  Lymphadenopathy:     Cervical: No cervical adenopathy.  Skin:    General: Skin is warm and dry.     Findings: No erythema or rash.  Neurological:     Mental Status: She is alert and oriented to person, place, and time.  Psychiatric:        Behavior: Behavior normal.        Thought Content: Thought content normal.        Judgment: Judgment normal.      RecentLabs       Lab Results  Component Value Date   WBC 7.3 09/23/2018   HGB 12.0 09/26/2018   HCT 39.4 09/26/2018   MCV 87.6 09/23/2018   PLT 479 (H) 09/23/2018       Chemistry   Labs(Brief)          Component Value Date/Time   NA 137 09/26/2018 0339   K 4.0 09/26/2018 0339   CL 105 09/26/2018 0339   CO2 24 09/26/2018 0339   BUN 9 09/26/2018 0339   CREATININE 0.70 09/26/2018 0339   CREATININE 0.81 09/08/2018 1148   CREATININE 0.74 05/18/2015 1356     Labs(Brief)          Component Value Date/Time   CALCIUM 8.6 (L) 09/26/2018 0339   ALKPHOS 52 09/08/2018 1148   AST 13 (L) 09/08/2018 1148   ALT 13 09/08/2018 1148   BILITOT 0.2 (L) 09/08/2018 1148        Impression and Plan: Karen Wall is a 64 year old white female.    I am glad that her iron levels are doing so well.  I am glad that her platelet count keeps coming down.  She is totally asymptomatic.  I think we can now get her through the summer.  We will plan to get her back in September.  I told her to make sure she wears sunscreen at the beach and to drink a lot of water.    [name], MD 6/11/202012:28 PM

## 2019-12-29 ENCOUNTER — Encounter: Payer: Self-pay | Admitting: *Deleted

## 2019-12-29 LAB — IRON AND TIBC
Iron: 73 ug/dL (ref 41–142)
Saturation Ratios: 24 % (ref 21–57)
TIBC: 304 ug/dL (ref 236–444)
UIBC: 230 ug/dL (ref 120–384)

## 2019-12-29 LAB — FERRITIN: Ferritin: 349 ng/mL — ABNORMAL HIGH (ref 11–307)

## 2019-12-29 LAB — LACTATE DEHYDROGENASE: LDH: 160 U/L (ref 98–192)

## 2020-01-14 ENCOUNTER — Other Ambulatory Visit: Payer: Self-pay | Admitting: Internal Medicine

## 2020-01-14 MED FILL — ARMOUR THYROID 15 MG TABLET: 15 | 90 days supply | Qty: 90 | Fill #0

## 2020-01-14 MED FILL — ARMOUR THYROID 60 MG TABLET: 60 | 90 days supply | Qty: 90 | Fill #0

## 2020-02-09 ENCOUNTER — Telehealth: Payer: Self-pay | Admitting: Hematology & Oncology

## 2020-02-09 NOTE — Telephone Encounter (Signed)
9/13 appts rescheduled due to provider PAL.  Updated letter w/ calendar printed & mailed. 

## 2020-02-15 ENCOUNTER — Other Ambulatory Visit (HOSPITAL_BASED_OUTPATIENT_CLINIC_OR_DEPARTMENT_OTHER): Payer: Self-pay | Admitting: Family Medicine

## 2020-02-15 DIAGNOSIS — Z1231 Encounter for screening mammogram for malignant neoplasm of breast: Secondary | ICD-10-CM

## 2020-02-16 ENCOUNTER — Other Ambulatory Visit: Payer: Self-pay

## 2020-02-16 ENCOUNTER — Ambulatory Visit (HOSPITAL_BASED_OUTPATIENT_CLINIC_OR_DEPARTMENT_OTHER)
Admission: RE | Admit: 2020-02-16 | Discharge: 2020-02-16 | Disposition: A | Discharge status: Home / Self Care | Payer: Medicare HMO | Source: Ambulatory Visit | Attending: Urology | Admitting: Urology

## 2020-02-16 ENCOUNTER — Ambulatory Visit (HOSPITAL_BASED_OUTPATIENT_CLINIC_OR_DEPARTMENT_OTHER)
Admission: RE | Admit: 2020-02-16 | Discharge: 2020-02-16 | Disposition: A | Discharge status: Home / Self Care | Payer: Medicare HMO | Source: Ambulatory Visit | Attending: Family Medicine | Admitting: Family Medicine

## 2020-02-16 ENCOUNTER — Other Ambulatory Visit (HOSPITAL_BASED_OUTPATIENT_CLINIC_OR_DEPARTMENT_OTHER): Payer: Self-pay | Admitting: Urology

## 2020-02-16 ENCOUNTER — Encounter (HOSPITAL_BASED_OUTPATIENT_CLINIC_OR_DEPARTMENT_OTHER): Payer: Self-pay

## 2020-02-16 DIAGNOSIS — C641 Malignant neoplasm of right kidney, except renal pelvis: Secondary | ICD-10-CM

## 2020-02-16 DIAGNOSIS — Z1231 Encounter for screening mammogram for malignant neoplasm of breast: Secondary | ICD-10-CM | POA: Insufficient documentation

## 2020-02-16 DIAGNOSIS — K449 Diaphragmatic hernia without obstruction or gangrene: Secondary | ICD-10-CM | POA: Diagnosis not present

## 2020-02-27 NOTE — Patient Instructions (Addendum)
Good to see you again today- please see me in about 6 months assuming all is well Please consider getting your shingles vaccine at the drug store at your convenience   Let's try increasing your prozac to 30 mg total; let me know if the tablets are not covered by your insurance I would like to see your depression sx under better control.  If you are in danger of self harm please seek immediate care If the higher dose of prozac is not working we can try a different medication for you

## 2020-02-27 NOTE — Progress Notes (Addendum)
[place] [facility] [address], [place], [place] [address] [phone]  Date:  02/29/2020   Name:  Karen Wall   DOB:  [birth year]   MRN:  [id number]  PCP:  Darreld Mclean, MD    Chief Complaint: Hypertension and Depression (follow up)   History of Present Illness:  Karen Wall is a 64 y.o. very pleasant female patient who presents with the following:  6 month follow-up visit today- last seen by myself in February of this year History of thyroid cancer s/p thyroidectomy 2014, HTN, renal cancer s/p partial nephrectomy 09/2018, iron def, essential thrombocytosis.  She also has chronic foot problems which limit her mobility.  She is starting to think more about having foot surgery in hopes of getting some relief from her pain.  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  No side effects on the 20 mg.  Her symptoms are improved, but she is still having significant depression in her life She does have a family history of depression and also is having a hard time with current events  There have been several suicides in the family.  Patient denies any current  suicidal ideation She is having some weight loss which she is happy about  covid done shingrix-she has Medicare, will need to be done at drugstore  She would like me to refill her lorazepam which helps her to sleep   Wt Readings from Last 3 Encounters:  02/29/20 187 lb (84.8 kg)  12/28/19 199 lb (90.3 kg)  11/16/19 209 lb (94.8 kg)     Lab Results  Component Value Date   TSH 1.26 09/02/2019    Patient Active Problem List   Diagnosis Date Noted  . Seizure disorder ([place]) 05/25/2019  . Neoplasm of right kidney 09/25/2018  . Cancer of kidney parenchyma, right ([place]) 09/08/2018  . Essential hypertension 05/12/2018  . Numbness and tingling of left side of face 05/06/2018  . 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 Thrombocytosis ([place]) 11/22/2017  . Bilateral ankle pain 10/15/2017  . IDA (iron deficiency anemia) 07/23/2016  . Vitamin D deficiency 03/15/2016  . Low vitamin B12 level 03/15/2016  . Dyspnea 05/28/2014  . Chest tightness 05/28/2014  . 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 A-fib [facility])    history of  . Acquired flat foot   . Ankle pain    bilateral  . Arthritis    feet ankles, and knees   . Cancer of kidney parenchyma, right (  [place]) 09/08/2018  . Depression   . Diverticulosis   . Dyspnea    with exertion   . Dysrhythmia    hx of afib with hyperthroid no issues currently   . GERD (gastroesophageal reflux disease)   . H/O hiatal hernia 08/09/2018   Moderate to Large  . Hyperlipidemia   . Hypertension   . Hypothyroidism   . Iron deficiency anemia    iron deficiency last iron infusion 08/2018  . 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 Numbness    Left side of face around mouth - unknown etiology  . Numbness and tingling    left side - facial  . Palpitations    Occurred during hyperthyroid  . PONV (postoperative nausea and vomiting)   . Right renal mass  07/2018   2.1 cm solid heterogeneously enhancing mass in posterior midpole   . Sinus tachycardia by electrocardiogram 05/12/2018  . Thrombocythemia ([place])   . Thrombocytosis ([place]) 11/22/2017  . Thyroid cancer ([place])   . Thyroid disease    hyperthyroidism  . Vitamin B 12 deficiency   . Vitamin D deficiency     Past Surgical History:  Procedure Laterality Date  . BREAST BIOPSY     age 48 - negative  . CHOLECYSTECTOMY  1992  . OPERATIVE ULTRASOUND Right 09/25/2018   Procedure: OPERATIVE ULTRASOUND;  Surgeon: Raynelle Bring, MD;  Location: [facility];  Service: Urology;  Laterality: Right;  . ROBOTIC ASSITED PARTIAL NEPHRECTOMY Right 09/25/2018   Procedure: XI ROBOTIC RIGHT  ASSITED PARTIAL NEPHRECTOMY;  Surgeon: Raynelle Bring, MD;  Location: [facility];  Service: Urology;  Laterality: Right;  . THYROIDECTOMY N/A 07/03/2013   Procedure: TOTAL THYROIDECTOMY;  Surgeon: Earnstine Regal, MD;  Location: [facility];  Service: General;  Laterality: N/A;    Social History   Tobacco Use  . Smoking status: Never Smoker  . Smokeless tobacco: Never Used  Vaping Use  . Vaping Use: Never used  Substance Use Topics  . Alcohol use: No    Alcohol/week: 0.0 standard drinks  . Drug use: No    Family History  Problem Relation Age of Onset  . Hypertension Mother   . Diabetes Mother   . Hyperlipidemia Mother   . COPD Mother   . CAD Father        MI in his 51s  . Diabetes Father   . Hyperlipidemia Father   . Hypertension Father   . Mental illness Father   . Mental illness Maternal Grandmother   . Stroke Maternal Grandmother   . Diabetes Maternal Grandmother   . Stroke Maternal Grandfather   . Heart attack Maternal Grandfather   . Congestive Heart Failure Paternal Grandmother   . Heart attack Paternal Grandfather     Allergies  Allergen Reactions  . Penicillins Shortness Of Breath    Did it involve swelling of the face/tongue/throat, SOB, or low BP? Yes Did it involve sudden or severe rash/hives,  skin peeling, or any reaction on the inside of your mouth or nose? No Did you need to seek medical attention at a hospital or doctor's office? Unknown When did it last happen?66-14 years old If all above answers are "NO", may proceed with cephalosporin use.     Medication list has been reviewed and updated.  Current Outpatient Medications on File Prior to Visit  Medication Sig Dispense Refill  . acetaminophen (TYLENOL) 500 MG tablet Take 500 mg by mouth every 6 (six) hours as needed for pain.    Marland Kitchen amLODipine-benazepril (LOTREL)  5-20 MG capsule Take 1 capsule by mouth at bedtime. 90 capsule 3  . ARMOUR THYROID 15 MG tablet TAKE 1 TABLET BY MOUTH ONCE DAILY ALONG WITH A 60 MG TABLET TO EQUAL 75 MG DOSE 90 tablet 3  . ARMOUR THYROID 60 MG tablet TAKE 1 TABLET BY MOUTH ONCE DAILY ALONG WITH A 15 MG TABLET TO EQUAL 75 MG DOSE 90 tablet 3  . aspirin 81 MG EC tablet Take 81 mg by mouth daily. Swallow whole.    Marland Kitchen FLUoxetine (PROZAC) 20 MG capsule Take 1 capsule (20 mg total) by mouth daily. Increase to 40 mg after 2-3 weeks 60 capsule 6  . LORazepam (ATIVAN) 1 MG tablet Take 1 tablet (1 mg total) by mouth at bedtime as needed for anxiety or sleep. 30 tablet 0  . lovastatin (MEVACOR) 20 MG tablet Take 1 tablet (20 mg total) by mouth at bedtime. 90 tablet 3  . hydroxyurea (HYDREA) 500 MG capsule Take 1 capsule (500 mg total) by mouth daily. May take with food to minimize GI side effects. (Patient not taking: Reported on 11/16/2019) 30 capsule 6   No current facility-administered medications on file prior to visit.    Review of Systems:  As per HPI- otherwise negative.   Physical Examination: Vitals:   02/29/20 1013  BP: 120/82  Pulse: 80  Resp: 17  SpO2: 97%   Vitals:   02/29/20 1013  Weight: 187 lb (84.8 kg)  Height: 5' 2.5" (1.588 m)   Body mass index is 33.66 kg/m. Ideal Body Weight: Weight in (lb) to have BMI = 25: 138.6  GEN: no acute distress.  Obese but has lost about 20  pounds, looks well HEENT: Atraumatic, Normocephalic.   Bilateral TM wnl, oropharynx normal.  PEERL,EOMI.   Ears and Nose: No external deformity. CV: RRR, No M/G/R. No JVD. No thrill. No extra heart sounds. PULM: CTA B, no wheezes, crackles, rhonchi. No retractions. No resp. distress. No accessory muscle use. ABD: S, NT, ND, +BS. No rebound. No HSM. EXTR: No c/c/e PSYCH: Normally interactive. Conversant.    Assessment and Plan: Reactive depression - Plan: FLUoxetine (PROZAC) 20 MG tablet  Primary insomnia - Plan: LORazepam (ATIVAN) 1 MG tablet  Poor sleep - Plan: LORazepam (ATIVAN) 1 MG tablet  Postsurgical hypothyroidism - Plan: TSH  Here today for follow-up visit We will recheck her thyroid since she has lost about 20 pounds Refill lorazepam for insomnia Discussed her depression.  She is having some improvement of her symptoms, especially anxiety symptoms, with Prozac.  Currently taking 20 mg, 40 mg caused side effects.  However, on her questionnaire she is still having significant depression symptoms.  We will have her try taking 30 mg of Prozac, she will let me know how this works for her.  If this is not effective consider switching to Effexor She agrees to seek care if any concern about suicide or self-harm This visit occurred during the SARS-CoV-2 public health emergency.  Safety protocols were in place, including screening questions prior to the visit, additional usage of staff PPE, and extensive cleaning of exam room while observing appropriate contact time as indicated for disinfecting solutions.    Signed Lamar Blinks, MD Received her [facility] 8/17 Results for orders placed or performed in visit on 02/29/20  TSH  Result Value Ref Range   TSH 0.11 (L) 0.35 - 4.50 uIU/mL   Need to adjust her thyroid med - right now she is taking a total of 75 armour.  Will have her drop back to 60 Message to pt

## 2020-02-29 ENCOUNTER — Ambulatory Visit (INDEPENDENT_AMBULATORY_CARE_PROVIDER_SITE_OTHER): Payer: Medicare HMO | Admitting: Family Medicine

## 2020-02-29 ENCOUNTER — Other Ambulatory Visit: Payer: Self-pay

## 2020-02-29 ENCOUNTER — Encounter: Payer: Self-pay | Admitting: Family Medicine

## 2020-02-29 VITALS — BP 120/82 | HR 80 | Resp 17 | Ht 62.5 in | Wt 187.0 lb

## 2020-02-29 DIAGNOSIS — F5101 Primary insomnia: Secondary | ICD-10-CM | POA: Diagnosis not present

## 2020-02-29 DIAGNOSIS — Z72820 Sleep deprivation: Secondary | ICD-10-CM | POA: Diagnosis not present

## 2020-02-29 DIAGNOSIS — F329 Major depressive disorder, single episode, unspecified: Secondary | ICD-10-CM | POA: Diagnosis not present

## 2020-02-29 DIAGNOSIS — E890 Postprocedural hypothyroidism: Secondary | ICD-10-CM | POA: Diagnosis not present

## 2020-02-29 LAB — TSH: TSH: 0.11 u[IU]/mL — ABNORMAL LOW (ref 0.35–4.50)

## 2020-02-29 MED ORDER — LORAZEPAM 1 MG PO TABS: 1.0000 mg | ORAL_TABLET | Freq: Every evening | ORAL | 0 refills | Status: DC | PRN

## 2020-02-29 MED ORDER — FLUOXETINE HCL 20 MG PO TABS: 30.0000 mg | ORAL_TABLET | Freq: Every day | ORAL | 3 refills | Status: DC

## 2020-02-29 MED FILL — LORAZEPAM 1 MG TABS: 1 | 30 days supply | Qty: 30 | Fill #0

## 2020-02-29 MED FILL — FLUOXETINE HCL 20 MG TABS: 20 | 30 days supply | Qty: 45 | Fill #0

## 2020-03-01 NOTE — Addendum Note (Signed)
Addended by: Lamar Blinks C on: 03/01/2020 03:43 PM   Modules accepted: Orders

## 2020-03-02 DIAGNOSIS — C641 Malignant neoplasm of right kidney, except renal pelvis: Secondary | ICD-10-CM | POA: Diagnosis not present

## 2020-03-02 DIAGNOSIS — D49511 Neoplasm of unspecified behavior of right kidney: Secondary | ICD-10-CM | POA: Diagnosis not present

## 2020-03-08 ENCOUNTER — Encounter: Payer: Self-pay | Admitting: Family Medicine

## 2020-03-08 ENCOUNTER — Other Ambulatory Visit: Payer: Self-pay | Admitting: Family Medicine

## 2020-03-08 MED ORDER — FLUOXETINE HCL 20 MG PO CAPS
ORAL_CAPSULE | ORAL | 3 refills | Status: DC
Start: 2020-03-08 — End: 2020-03-08

## 2020-03-08 NOTE — Addendum Note (Signed)
Addended by: Lamar Blinks C on: 03/08/2020 01:12 PM   Modules accepted: Orders

## 2020-03-14 MED FILL — FLUoxetine HCL 20 MG CAPS: 20 | 90 days supply | Qty: 135 | Fill #0

## 2020-03-28 ENCOUNTER — Other Ambulatory Visit: Payer: Medicare HMO

## 2020-03-28 ENCOUNTER — Ambulatory Visit: Payer: Medicare HMO | Admitting: Hematology & Oncology

## 2020-03-28 ENCOUNTER — Other Ambulatory Visit: Payer: Self-pay

## 2020-03-28 DIAGNOSIS — E890 Postprocedural hypothyroidism: Secondary | ICD-10-CM | POA: Diagnosis not present

## 2020-03-28 NOTE — Addendum Note (Signed)
Addended by: Angelina Pih on: 03/28/2020 08:45 AM   Modules accepted: Orders

## 2020-03-29 ENCOUNTER — Encounter: Payer: Self-pay | Admitting: Family Medicine

## 2020-03-29 ENCOUNTER — Inpatient Hospital Stay: Payer: Medicare HMO | Attending: Hematology & Oncology

## 2020-03-29 ENCOUNTER — Inpatient Hospital Stay (HOSPITAL_BASED_OUTPATIENT_CLINIC_OR_DEPARTMENT_OTHER): Payer: Medicare HMO | Admitting: Hematology & Oncology

## 2020-03-29 ENCOUNTER — Telehealth: Payer: Self-pay | Admitting: Hematology & Oncology

## 2020-03-29 ENCOUNTER — Other Ambulatory Visit: Payer: Self-pay

## 2020-03-29 ENCOUNTER — Encounter: Payer: Self-pay | Admitting: Hematology & Oncology

## 2020-03-29 VITALS — BP 113/70 | HR 73 | Temp 98.4°F | Resp 18 | Wt 180.0 lb

## 2020-03-29 DIAGNOSIS — C641 Malignant neoplasm of right kidney, except renal pelvis: Secondary | ICD-10-CM | POA: Diagnosis not present

## 2020-03-29 DIAGNOSIS — Z79899 Other long term (current) drug therapy: Secondary | ICD-10-CM | POA: Diagnosis not present

## 2020-03-29 DIAGNOSIS — D473 Essential (hemorrhagic) thrombocythemia: Secondary | ICD-10-CM | POA: Diagnosis not present

## 2020-03-29 DIAGNOSIS — D75839 Thrombocytosis, unspecified: Secondary | ICD-10-CM

## 2020-03-29 DIAGNOSIS — D509 Iron deficiency anemia, unspecified: Secondary | ICD-10-CM | POA: Diagnosis not present

## 2020-03-29 DIAGNOSIS — K909 Intestinal malabsorption, unspecified: Secondary | ICD-10-CM

## 2020-03-29 LAB — CBC WITH DIFFERENTIAL (CANCER CENTER ONLY)
Abs Immature Granulocytes: 0.08 10*3/uL — ABNORMAL HIGH (ref 0.00–0.07)
Basophils Absolute: 0.1 10*3/uL (ref 0.0–0.1)
Basophils Relative: 1 %
Eosinophils Absolute: 0.1 10*3/uL (ref 0.0–0.5)
Eosinophils Relative: 1 %
HCT: 42.3 % (ref 36.0–46.0)
Hemoglobin: 13.7 g/dL (ref 12.0–15.0)
Immature Granulocytes: 1 %
Lymphocytes Relative: 26 %
Lymphs Abs: 2.2 10*3/uL (ref 0.7–4.0)
MCH: 28 pg (ref 26.0–34.0)
MCHC: 32.4 g/dL (ref 30.0–36.0)
MCV: 86.3 fL (ref 80.0–100.0)
Monocytes Absolute: 0.8 10*3/uL (ref 0.1–1.0)
Monocytes Relative: 9 %
Neutro Abs: 5.3 10*3/uL (ref 1.7–7.7)
Neutrophils Relative %: 62 %
Platelet Count: 415 10*3/uL — ABNORMAL HIGH (ref 150–400)
RBC: 4.9 MIL/uL (ref 3.87–5.11)
RDW: 14.2 % (ref 11.5–15.5)
WBC Count: 8.5 10*3/uL (ref 4.0–10.5)
nRBC: 0 % (ref 0.0–0.2)

## 2020-03-29 LAB — CMP (CANCER CENTER ONLY)
ALT: 10 U/L (ref 0–44)
AST: 13 U/L — ABNORMAL LOW (ref 15–41)
Albumin: 4 g/dL (ref 3.5–5.0)
Alkaline Phosphatase: 45 U/L (ref 38–126)
Anion gap: 7 (ref 5–15)
BUN: 13 mg/dL (ref 8–23)
CO2: 29 mmol/L (ref 22–32)
Calcium: 9.3 mg/dL (ref 8.9–10.3)
Chloride: 103 mmol/L (ref 98–111)
Creatinine: 0.77 mg/dL (ref 0.44–1.00)
GFR, Est AFR Am: >60 mL/min (ref >60)
GFR, Estimated: >60 mL/min (ref >60)
Glucose, Bld: 86 mg/dL (ref 70–99)
Potassium: 4.2 mmol/L (ref 3.5–5.1)
Sodium: 139 mmol/L (ref 135–145)
Total Bilirubin: 0.4 mg/dL (ref 0.3–1.2)
Total Protein: 6.7 g/dL (ref 6.5–8.1)

## 2020-03-29 LAB — RETICULOCYTES
Immature Retic Fract: 4.7 % (ref 2.3–15.9)
RBC.: 4.79 MIL/uL (ref 3.87–5.11)
Retic Count, Absolute: 61.3 10*3/uL (ref 19.0–186.0)
Retic Ct Pct: 1.3 % (ref 0.4–3.1)

## 2020-03-29 LAB — TSH: TSH: 1.14 mIU/L (ref 0.40–4.50)

## 2020-03-29 LAB — LACTATE DEHYDROGENASE: LDH: 132 U/L (ref 98–192)

## 2020-03-29 NOTE — Progress Notes (Signed)
Hematology and Oncology Follow Up Visit  Karen Wall [id number] 01/12/1956 64 y.o. 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  She really looks great.  She is losing weight.  She has lost about 30 pounds since we last saw her.  Her birthday is coming up next week.  She is looking forward to her birthday.  She has had no problems with fatigue or weakness.  She has had no issues with respect to the kidney cancer.  I think she sees a urologist in February.  She did go to the beach this summer.  She had a wonderful time at the beach.  She was good but wearing a sunscreen.  There has been no problems with nausea or vomiting.  Her iron levels have been doing fairly well.  Only last saw her in June, her ferritin was 349 with an iron saturation of 24%.    Overall, her performance status is ECOG 1.    Medications:  Current Outpatient Medications:  .  acetaminophen (TYLENOL) 500 MG tablet, Take 500 mg by mouth every 6 (six) hours as needed for pain., Disp: , Rfl:  .  amLODipine-benazepril (LOTREL) 5-20 MG capsule, Take 1 capsule by mouth at bedtime., Disp: 90 capsule, Rfl: 1 .  LORazepam (ATIVAN) 1 MG tablet, Take 1 tablet (1 mg total) by mouth at bedtime as needed for anxiety or sleep., Disp: 30 tablet, Rfl: 0 .  lovastatin (MEVACOR) 20 MG tablet, Take 1 tablet (20 mg total) by mouth at bedtime., Disp: 90 tablet, Rfl: 3 .  thyroid (ARMOUR THYROID) 15 MG tablet, Take 1 tablet (15 mg total) by mouth daily. Along with the 60 mg tablet. (Patient taking differently: Take 15 mg by  mouth daily before breakfast. Along with the 60 mg tablet.), Disp: 90 tablet, Rfl: 3 .  thyroid (ARMOUR THYROID) 60 MG tablet, Take every day along with the 15 mg tablet. (Patient taking differently: Take 60 mg by mouth daily before breakfast. Take every day along with the 15 mg tablet.), Disp: 90 tablet, Rfl: 3 .  traMADol (ULTRAM) 50 MG tablet, Take 1-2 tablets (50-100 mg total) by mouth every 6 (six) hours as needed for moderate pain or severe pain., Disp: 20 tablet, Rfl: 0  Allergies:       Allergies  Allergen Reactions  . Penicillins Shortness Of Breath    Did it involve swelling of the face/tongue/throat, SOB, or low BP? Yes Did it involve sudden or severe rash/hives, skin peeling, or any reaction on the inside of your mouth or nose? No Did you need to seek medical attention at a hospital or doctor's office? Unknown When did it last happen?6-29 years old If all above answers are "NO", may proceed with cephalosporin use.     Past Medical History, Surgical history, Social history, and Family History were reviewed and updated.  Review of Systems: Review of Systems  Constitutional: Negative.   HENT:  Negative.   Eyes: Negative.   Respiratory: Negative.   Cardiovascular: Negative.   Gastrointestinal: Negative.   Endocrine: Negative.   Genitourinary: Negative.    Musculoskeletal: Negative.   Skin: Negative.   Neurological: Negative.   Hematological: Negative.   Psychiatric/Behavioral: Negative.     Physical Exam: Vital signs this visit show temperature of 97.1.  Pulse is 78.  Blood pressure 100/66.  Weight is 209 pounds.     Wt Readings from Last 3 Encounters:  09/25/18 213 lb (96.6 kg)  09/23/18 213 lb (96.6 kg)  09/08/18 198 lb (89.8 kg)    Physical Exam Vitals signs reviewed.  HENT:     Head: Normocephalic and atraumatic.  Eyes:     Pupils: Pupils are equal, round, and reactive to light.  Neck:     Musculoskeletal: Normal range of motion.   Cardiovascular:     Rate and Rhythm: Normal rate and regular rhythm.     Heart sounds: Normal heart sounds.  Pulmonary:     Effort: Pulmonary effort is normal.     Breath sounds: Normal breath sounds.  Abdominal:     General: Bowel sounds are normal.     Palpations: Abdomen is soft.  Musculoskeletal: Normal range of motion.        General: No tenderness or deformity.  Lymphadenopathy:     Cervical: No cervical adenopathy.  Skin:    General: Skin is warm and dry.     Findings: No erythema or rash.  Neurological:     Mental Status: She is alert and oriented to person, place, and time.  Psychiatric:        Behavior: Behavior normal.        Thought Content: Thought content normal.        Judgment: Judgment normal.      RecentLabs       Lab Results  Component Value Date   WBC 7.3 09/23/2018   HGB 12.0 09/26/2018   HCT 39.4 09/26/2018   MCV 87.6 09/23/2018   PLT 479 (H) 09/23/2018       Chemistry   Labs(Brief)          Component Value Date/Time   NA 137 09/26/2018 0339   K 4.0 09/26/2018 0339   CL 105 09/26/2018 0339   CO2 24 09/26/2018 0339   BUN 9 09/26/2018 0339   CREATININE 0.70 09/26/2018 0339   CREATININE 0.81 09/08/2018 1148   CREATININE 0.74 05/18/2015 1356     Labs(Brief)          Component Value Date/Time   CALCIUM 8.6 (L) 09/26/2018 0339   ALKPHOS 52 09/08/2018 1148   AST 13 (L) 09/08/2018 1148   ALT 13 09/08/2018 1148   BILITOT 0.2 (L) 09/08/2018 1148        Impression and Plan: Karen Wall is a 64 year old white female.    She has thrombocytosis.  Surprisingly, her platelet keep coming down.  I am very happy about this.  It is certainly conceivable that she does not have an actual myeloproliferative disorder.  I have tried to hold off on doing a bone marrow biopsy on her.  I just do not see that we have to do this right now.  We will now plan to get her back in 3 months.  If all looks good in 3 months, then we  will plan for 4 months and try to get her through the  winter.     [name], MD 6/11/202012:28 PM

## 2020-03-29 NOTE — Telephone Encounter (Signed)
Appointments scheduled patient will get update from My Chart per 9/14 los

## 2020-03-30 ENCOUNTER — Encounter: Payer: Self-pay | Admitting: *Deleted

## 2020-03-30 LAB — IRON AND TIBC
Iron: 71 ug/dL (ref 41–142)
Saturation Ratios: 27 % (ref 21–57)
TIBC: 260 ug/dL (ref 236–444)
UIBC: 189 ug/dL (ref 120–384)

## 2020-03-30 LAB — FERRITIN: Ferritin: 400 ng/mL — ABNORMAL HIGH (ref 11–307)

## 2020-04-18 MED FILL — ARMOUR THYROID 60 MG TABLET: 60 | 90 days supply | Qty: 90 | Fill #1

## 2020-06-28 ENCOUNTER — Telehealth: Payer: Self-pay

## 2020-06-28 ENCOUNTER — Encounter: Payer: Self-pay | Admitting: Hematology & Oncology

## 2020-06-28 ENCOUNTER — Other Ambulatory Visit: Payer: Self-pay

## 2020-06-28 ENCOUNTER — Inpatient Hospital Stay: Payer: Medicare HMO | Attending: Hematology & Oncology

## 2020-06-28 ENCOUNTER — Inpatient Hospital Stay (HOSPITAL_BASED_OUTPATIENT_CLINIC_OR_DEPARTMENT_OTHER): Payer: Medicare HMO | Admitting: Hematology & Oncology

## 2020-06-28 VITALS — BP 111/75 | HR 72 | Temp 98.2°F | Resp 18 | Wt 172.5 lb

## 2020-06-28 DIAGNOSIS — D75839 Thrombocytosis, unspecified: Secondary | ICD-10-CM

## 2020-06-28 DIAGNOSIS — D473 Essential (hemorrhagic) thrombocythemia: Secondary | ICD-10-CM | POA: Diagnosis not present

## 2020-06-28 DIAGNOSIS — K909 Intestinal malabsorption, unspecified: Secondary | ICD-10-CM

## 2020-06-28 DIAGNOSIS — Z880 Allergy status to penicillin: Secondary | ICD-10-CM | POA: Diagnosis not present

## 2020-06-28 DIAGNOSIS — D509 Iron deficiency anemia, unspecified: Secondary | ICD-10-CM | POA: Insufficient documentation

## 2020-06-28 DIAGNOSIS — R634 Abnormal weight loss: Secondary | ICD-10-CM | POA: Diagnosis not present

## 2020-06-28 DIAGNOSIS — C641 Malignant neoplasm of right kidney, except renal pelvis: Secondary | ICD-10-CM

## 2020-06-28 DIAGNOSIS — C73 Malignant neoplasm of thyroid gland: Secondary | ICD-10-CM | POA: Diagnosis not present

## 2020-06-28 DIAGNOSIS — Z79899 Other long term (current) drug therapy: Secondary | ICD-10-CM | POA: Insufficient documentation

## 2020-06-28 LAB — CMP (CANCER CENTER ONLY)
ALT: 7 U/L (ref 0–44)
AST: 12 U/L — ABNORMAL LOW (ref 15–41)
Albumin: 4 g/dL (ref 3.5–5.0)
Alkaline Phosphatase: 49 U/L (ref 38–126)
Anion gap: 8 (ref 5–15)
BUN: 14 mg/dL (ref 8–23)
CO2: 30 mmol/L (ref 22–32)
Calcium: 9.5 mg/dL (ref 8.9–10.3)
Chloride: 101 mmol/L (ref 98–111)
Creatinine: 0.78 mg/dL (ref 0.44–1.00)
GFR, Estimated: >60 mL/min (ref >60)
Glucose, Bld: 96 mg/dL (ref 70–99)
Potassium: 4.3 mmol/L (ref 3.5–5.1)
Sodium: 139 mmol/L (ref 135–145)
Total Bilirubin: 0.4 mg/dL (ref 0.3–1.2)
Total Protein: 6.9 g/dL (ref 6.5–8.1)

## 2020-06-28 LAB — RETICULOCYTES
Immature Retic Fract: 5.1 % (ref 2.3–15.9)
RBC.: 4.93 MIL/uL (ref 3.87–5.11)
Retic Count, Absolute: 61.6 10*3/uL (ref 19.0–186.0)
Retic Ct Pct: 1.3 % (ref 0.4–3.1)

## 2020-06-28 LAB — CBC WITH DIFFERENTIAL (CANCER CENTER ONLY)
Abs Immature Granulocytes: 0.05 10*3/uL (ref 0.00–0.07)
Basophils Absolute: 0 10*3/uL (ref 0.0–0.1)
Basophils Relative: 0 %
Eosinophils Absolute: 0 10*3/uL (ref 0.0–0.5)
Eosinophils Relative: 1 %
HCT: 43.4 % (ref 36.0–46.0)
Hemoglobin: 14.1 g/dL (ref 12.0–15.0)
Immature Granulocytes: 1 %
Lymphocytes Relative: 27 %
Lymphs Abs: 1.8 10*3/uL (ref 0.7–4.0)
MCH: 28.5 pg (ref 26.0–34.0)
MCHC: 32.5 g/dL (ref 30.0–36.0)
MCV: 87.7 fL (ref 80.0–100.0)
Monocytes Absolute: 0.6 10*3/uL (ref 0.1–1.0)
Monocytes Relative: 9 %
Neutro Abs: 4.2 10*3/uL (ref 1.7–7.7)
Neutrophils Relative %: 62 %
Platelet Count: 398 10*3/uL (ref 150–400)
RBC: 4.95 MIL/uL (ref 3.87–5.11)
RDW: 14 % (ref 11.5–15.5)
WBC Count: 6.7 10*3/uL (ref 4.0–10.5)
nRBC: 0 % (ref 0.0–0.2)

## 2020-06-28 LAB — FERRITIN: Ferritin: 286 ng/mL (ref 11–307)

## 2020-06-28 LAB — LACTATE DEHYDROGENASE: LDH: 123 U/L (ref 98–192)

## 2020-06-28 LAB — SAVE SMEAR(SSMR), FOR PROVIDER SLIDE REVIEW

## 2020-06-28 LAB — IRON AND TIBC
Iron: 90 ug/dL (ref 41–142)
Saturation Ratios: 31 % (ref 21–57)
TIBC: 286 ug/dL (ref 236–444)
UIBC: 197 ug/dL (ref 120–384)

## 2020-06-28 NOTE — Telephone Encounter (Signed)
No los yet, per pt he needs to see her back in 4 months, appts made and pt advised we will call if order differs, req appt not to be printed and will view on ,y chart.... AOM

## 2020-06-28 NOTE — Progress Notes (Signed)
Hematology and Oncology Follow Up Visit  Karen Wall [id number] [birth year] 64 y.o. 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  She really is doing quite nicely.  She continues to lose weight.  She is trying to exercise a little bit more.  She had a nice Thanksgiving.  Her son came over.  She is looking forward to Christmas.  She still has problems with her ankles.  She still is not able to do all that much.  Her platelet count continues to improve.  So far, we have not had to start her on Hydrea.   When we last saw her in September, her ferritin was 400 with an iron saturation of 27%.  She says that sometimes at nighttime, her pulse goes down to the 40s.  I am not sure exactly what this indicates.  She has no problems with syncope or dizziness.  There is been no change in bowel or bladder habits.  She has had no issues with rashes.  There is been no pain in her hands or feet.  She has had no headaches..  She sees the urologist in March for follow-up for her a kidney cancer.  Overall, I have to say her performance status is ECOG 1.     Medications:  Current Outpatient Medications:  .  acetaminophen (TYLENOL) 500 MG tablet, Take 500 mg by mouth every 6 (six) hours as needed for pain., Disp: , Rfl:  .  amLODipine-benazepril (LOTREL) 5-20 MG capsule, Take 1 capsule by mouth at bedtime., Disp: 90 capsule, Rfl: 1 .  LORazepam (ATIVAN) 1 MG tablet, Take 1 tablet (1 mg total) by mouth at bedtime as needed for anxiety or sleep., Disp: 30 tablet, Rfl: 0 .  lovastatin  (MEVACOR) 20 MG tablet, Take 1 tablet (20 mg total) by mouth at bedtime., Disp: 90 tablet, Rfl: 3 .  thyroid (ARMOUR THYROID) 15 MG tablet, Take 1 tablet (15 mg total) by mouth daily. Along with the 60 mg tablet. (Patient taking differently: Take 15 mg by mouth daily before breakfast. Along with the 60 mg tablet.), Disp: 90 tablet, Rfl: 3 .  thyroid (ARMOUR THYROID) 60 MG tablet, Take every day along with the 15 mg tablet. (Patient taking differently: Take 60 mg by mouth daily before breakfast. Take every day along with the 15 mg tablet.), Disp: 90 tablet, Rfl: 3 .  traMADol (ULTRAM) 50 MG tablet, Take 1-2 tablets (50-100 mg total) by mouth every 6 (six) hours as needed for moderate pain or severe pain., Disp: 20 tablet, Rfl: 0  Allergies:       Allergies  Allergen Reactions  . Penicillins Shortness Of Breath    Did it involve swelling of the face/tongue/throat, SOB, or low BP? Yes Did it involve sudden or severe rash/hives, skin peeling, or any reaction on the inside of your mouth or  nose? No Did you need to seek medical attention at a hospital or doctor's office? Unknown When did it last happen?28-28 years old If all above answers are "NO", may proceed with cephalosporin use.     Past Medical History, Surgical history, Social history, and Family History were reviewed and updated.  Review of Systems: Review of Systems  Constitutional: Negative.   HENT:  Negative.   Eyes: Negative.   Respiratory: Negative.   Cardiovascular: Negative.   Gastrointestinal: Negative.   Endocrine: Negative.   Genitourinary: Negative.    Musculoskeletal: Negative.   Skin: Negative.   Neurological: Negative.   Hematological: Negative.   Psychiatric/Behavioral: Negative.     Physical Exam: Vital signs this visit show temperature of 97.1.  Pulse is 78.  Blood pressure 100/66.  Weight is 209 pounds.     Wt Readings from Last 3 Encounters:  09/25/18 213 lb (96.6 kg)  09/23/18 213 lb  (96.6 kg)  09/08/18 198 lb (89.8 kg)    Physical Exam Vitals signs reviewed.  HENT:     Head: Normocephalic and atraumatic.  Eyes:     Pupils: Pupils are equal, round, and reactive to light.  Neck:     Musculoskeletal: Normal range of motion.  Cardiovascular:     Rate and Rhythm: Normal rate and regular rhythm.     Heart sounds: Normal heart sounds.  Pulmonary:     Effort: Pulmonary effort is normal.     Breath sounds: Normal breath sounds.  Abdominal:     General: Bowel sounds are normal.     Palpations: Abdomen is soft.  Musculoskeletal: Normal range of motion.        General: No tenderness or deformity.  Lymphadenopathy:     Cervical: No cervical adenopathy.  Skin:    General: Skin is warm and dry.     Findings: No erythema or rash.  Neurological:     Mental Status: She is alert and oriented to person, place, and time.  Psychiatric:        Behavior: Behavior normal.        Thought Content: Thought content normal.        Judgment: Judgment normal.      RecentLabs       Lab Results  Component Value Date   WBC 7.3 09/23/2018   HGB 12.0 09/26/2018   HCT 39.4 09/26/2018   MCV 87.6 09/23/2018   PLT 479 (H) 09/23/2018       Chemistry   Labs(Brief)          Component Value Date/Time   NA 137 09/26/2018 0339   K 4.0 09/26/2018 0339   CL 105 09/26/2018 0339   CO2 24 09/26/2018 0339   BUN 9 09/26/2018 0339   CREATININE 0.70 09/26/2018 0339   CREATININE 0.81 09/08/2018 1148   CREATININE 0.74 05/18/2015 1356     Labs(Brief)          Component Value Date/Time   CALCIUM 8.6 (L) 09/26/2018 0339   ALKPHOS 52 09/08/2018 1148   AST 13 (L) 09/08/2018 1148   ALT 13 09/08/2018 1148   BILITOT 0.2 (L) 09/08/2018 1148        Impression and Plan: Karen Wall is a 64 year old white female.    She has thrombocytosis.  Surprisingly, her platelet keep still coming down.  I am very happy about this.    I have to believe that getting  her iron back to normal has been the biggest factor for her with the  platelets.  I think we can now get her back in 4 months.  As long as her platelet count is doing well, we will try to move her appointments out slowly but surely.   [name], MD 6/11/202012:28 PM

## 2020-07-13 ENCOUNTER — Encounter: Payer: Self-pay | Admitting: Family Medicine

## 2020-07-14 MED FILL — ARMOUR THYROID 60 MG TABLET: 60 | 90 days supply | Qty: 90 | Fill #2

## 2020-07-29 ENCOUNTER — Other Ambulatory Visit (HOSPITAL_BASED_OUTPATIENT_CLINIC_OR_DEPARTMENT_OTHER): Payer: Self-pay | Admitting: Urology

## 2020-07-29 DIAGNOSIS — D49511 Neoplasm of unspecified behavior of right kidney: Secondary | ICD-10-CM

## 2020-07-29 DIAGNOSIS — C641 Malignant neoplasm of right kidney, except renal pelvis: Secondary | ICD-10-CM

## 2020-08-03 DIAGNOSIS — C641 Malignant neoplasm of right kidney, except renal pelvis: Secondary | ICD-10-CM | POA: Diagnosis not present

## 2020-08-06 ENCOUNTER — Ambulatory Visit (HOSPITAL_BASED_OUTPATIENT_CLINIC_OR_DEPARTMENT_OTHER): Payer: Medicare HMO

## 2020-08-13 ENCOUNTER — Ambulatory Visit (HOSPITAL_BASED_OUTPATIENT_CLINIC_OR_DEPARTMENT_OTHER)
Admission: RE | Admit: 2020-08-13 | Discharge: 2020-08-13 | Disposition: A | Discharge status: Home / Self Care | Payer: Medicare HMO | Source: Ambulatory Visit | Attending: Urology | Admitting: Urology

## 2020-08-13 ENCOUNTER — Other Ambulatory Visit: Payer: Self-pay

## 2020-08-13 DIAGNOSIS — C641 Malignant neoplasm of right kidney, except renal pelvis: Secondary | ICD-10-CM | POA: Insufficient documentation

## 2020-08-13 DIAGNOSIS — Z9049 Acquired absence of other specified parts of digestive tract: Secondary | ICD-10-CM | POA: Diagnosis not present

## 2020-08-13 DIAGNOSIS — K449 Diaphragmatic hernia without obstruction or gangrene: Secondary | ICD-10-CM | POA: Diagnosis not present

## 2020-08-13 DIAGNOSIS — Z905 Acquired absence of kidney: Secondary | ICD-10-CM | POA: Diagnosis not present

## 2020-08-13 DIAGNOSIS — D49511 Neoplasm of unspecified behavior of right kidney: Secondary | ICD-10-CM | POA: Diagnosis not present

## 2020-08-13 MED ORDER — GADOBUTROL 1 MMOL/ML IV SOLN
8.0000 mL | Freq: Once | INTRAVENOUS | Status: AC | PRN
  Administered 2020-08-13: 8 mL via INTRAVENOUS

## 2020-08-13 MED ORDER — IOHEXOL 300 MG/ML  SOLN
100.0000 mL | Freq: Once | INTRAMUSCULAR | Status: AC | PRN
  Administered 2020-08-13: 80 mL via INTRAVENOUS

## 2020-08-15 ENCOUNTER — Inpatient Hospital Stay (HOSPITAL_BASED_OUTPATIENT_CLINIC_OR_DEPARTMENT_OTHER): Admission: RE | Admit: 2020-08-15 | Payer: Medicare HMO | Source: Ambulatory Visit

## 2020-08-27 NOTE — Progress Notes (Addendum)
[facility] [address] [place], [place] [address] [phone]  Date:  08/31/2020   Name:  Karen Wall   DOB:  07/28/1955   MRN:  [id number]  PCP:  Darreld Mclean, MD    Chief Complaint: Depression (6 month follow up, prozac keeping her up, trouble sleeping/)   History of Present Illness:  Karen Wall is a 65 y.o. very pleasant female patient who presents with the following:  Here today for 6 month follow-up  Last seen by myself in August. History of thyroid cancer s/p thyroidectomy 2014, HTN, renal cancer s/p partial nephrectomy 09/2018, iron def, essential thrombocytosis.She also has chronic foot problems which limit her mobility.  No change in her feet Her son was also just diagnosed with the same foot problems- he is 65 years old.  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  However, she otherwise feels that her current dose of Prozac is working well, her mood is under good control.  If possible she would like to continue her current routine  Lab Results  Component Value Date   TSH 1.14 03/28/2020    No postmenopausal bleeding, no chest pain or shortness of breath  Several weeks ago she fell like she  was bradycardic in the evening, no pain, but she felt faint.  She feels like her pulse may have been in the 30s at that time, she is not quite sure.  This has not happened to her again.  We discussed a cardiac monitor, for the time being she declines.  She will let me know if any further recurrences Patient Active Problem List   Diagnosis Date Noted  . Seizure disorder ([place]) 05/25/2019  . Neoplasm of right kidney 09/25/2018  . Cancer of kidney parenchyma, right ([place]) 09/08/2018  . Essential hypertension 05/12/2018  . Numbness and tingling of left side of face 05/06/2018  . 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 Thrombocytosis 11/22/2017  . Bilateral ankle pain 10/15/2017  . IDA (iron deficiency anemia) 07/23/2016  . Vitamin D deficiency 03/15/2016  . Low vitamin B12 level 03/15/2016  . Dyspnea 05/28/2014  . Chest tightness 05/28/2014  . 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 A-fib [facility])    history of  . Acquired flat foot   . Ankle pain    bilateral  . Arthritis    feet ankles, and knees   . Cancer of kidney parenchyma, right ([place]) 09/08/2018  . Depression   . Diverticulosis   . Dyspnea    with exertion   . Dysrhythmia    hx of  afib with hyperthroid no issues currently   . GERD (gastroesophageal reflux disease)   . H/O hiatal hernia 08/09/2018   Moderate to Large  . Hyperlipidemia   . Hypertension   . Hypothyroidism   . Iron deficiency anemia    iron deficiency last iron infusion 08/2018  . 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 Numbness    Left side of face around mouth - unknown etiology  . Numbness and tingling    left side - facial  . Palpitations    Occurred during hyperthyroid  . PONV (postoperative nausea and vomiting)   . Right renal mass 07/2018   2.1 cm solid heterogeneously enhancing mass in posterior midpole   . Sinus tachycardia by electrocardiogram  05/12/2018  . Thrombocythemia   . Thrombocytosis 11/22/2017  . Thyroid cancer ([place])   . Thyroid disease    hyperthyroidism  . Vitamin B 12 deficiency   . Vitamin D deficiency     Past Surgical History:  Procedure Laterality Date  . BREAST BIOPSY     age 41 - negative  . CHOLECYSTECTOMY  1992  . OPERATIVE ULTRASOUND Right 09/25/2018   Procedure: OPERATIVE ULTRASOUND;  Surgeon: Raynelle Bring, MD;  Location: [facility];  Service: Urology;  Laterality: Right;  . ROBOTIC ASSITED PARTIAL NEPHRECTOMY Right 09/25/2018   Procedure: XI ROBOTIC RIGHT  ASSITED PARTIAL NEPHRECTOMY;  Surgeon: Raynelle Bring, MD;  Location: [facility];  Service: Urology;  Laterality: Right;  . THYROIDECTOMY N/A 07/03/2013   Procedure: TOTAL THYROIDECTOMY;  Surgeon: Earnstine Regal, MD;  Location: [facility];  Service: General;  Laterality: N/A;    Social History   Tobacco Use  . Smoking status: Never Smoker  . Smokeless tobacco: Never Used  Vaping Use  . Vaping Use: Never used  Substance Use Topics  . Alcohol use: No    Alcohol/week: 0.0 standard drinks  . Drug use: No    Family History  Problem Relation Age of Onset  . Hypertension Mother   . Diabetes Mother   . Hyperlipidemia Mother   . COPD Mother   . CAD Father        MI in his 26s  . Diabetes Father   . Hyperlipidemia Father   . Hypertension Father   . Mental illness Father   . Mental illness Maternal Grandmother   . Stroke Maternal Grandmother   . Diabetes Maternal Grandmother   . Stroke Maternal Grandfather   . Heart attack Maternal Grandfather   . Congestive Heart Failure Paternal Grandmother   . Heart attack Paternal Grandfather     Allergies  Allergen Reactions  . Penicillins Shortness Of Breath    Did it involve swelling of the face/tongue/throat, SOB, or low BP? Yes Did it involve sudden or severe rash/hives, skin peeling, or any reaction on the inside of your mouth or nose? No Did you need to seek medical attention at a hospital or  doctor's office? Unknown When did it last happen?73-34 years old If all above answers are "NO", may proceed with cephalosporin use.     Medication list has been reviewed and updated.  Current Outpatient Medications on File Prior to Visit  Medication Sig Dispense Refill  . acetaminophen (TYLENOL) 500 MG tablet Take 500 mg by mouth every 6 (six) hours as needed for pain.    Marland Kitchen aspirin 81 MG EC tablet Take 81 mg by mouth daily. Swallow whole.    . Cholecalciferol (VITAMIN D3) 1.25 MG (50000 UT) TABS Take 5,000 Units by mouth.    Marland Kitchen  CYANOCOBALAMIN SL Place under the tongue.    Marland Kitchen FLUARIX QUADRIVALENT 0.5 ML injection     . FLUoxetine (PROZAC) 20 MG capsule Alternate between 20 mg PO daily and 40 mg PO daily 135 capsule 3  . thyroid (ARMOUR) 60 MG tablet Take 60 mg by mouth daily before breakfast.    . hydroxyurea (HYDREA) 500 MG capsule Take 1 capsule (500 mg total) by mouth daily. May take with food to minimize GI side effects. (Patient not taking: No sig reported) 30 capsule 6   No current facility-administered medications on file prior to visit.    Review of Systems:  As per HPI- otherwise negative.   Physical Examination: Vitals:   08/31/20 1021  BP: 132/86  Pulse: 81  Resp: 17  SpO2: 99%   Vitals:   08/31/20 1021  Weight: 168 lb (76.2 kg)  Height: 5' 2.5" (1.588 m)   Body mass index is 30.24 kg/m. Ideal Body Weight: Weight in (lb) to have BMI = 25: 138.6  GEN: no acute distress.  Obese, looks well HEENT: Atraumatic, Normocephalic.   Bilateral TM wnl, oropharynx normal.  PEERL,EOMI.   Ears and Nose: No external deformity. CV: RRR, No M/G/R. No JVD. No thrill. No extra heart sounds. PULM: CTA B, no wheezes, crackles, rhonchi. No retractions. No resp. distress. No accessory muscle use. ABD: S, NT, ND, +BS. No rebound. No HSM. EXTR: No c/c/e PSYCH: Normally interactive. Conversant.  Pap today -vaginal atrophy consistent with age, otherwise normal exam.  No bimanual  performed  Assessment and Plan: Screening for cervical cancer - Plan: Cytology - PAP  Primary insomnia - Plan: LORazepam (ATIVAN) 1 MG tablet  Poor sleep - Plan: LORazepam (ATIVAN) 1 MG tablet  Essential hypertension - Plan: amLODipine-benazepril (LOTREL) 5-20 MG capsule  Hyperlipidemia, unspecified hyperlipidemia type - Plan: lovastatin (MEVACOR) 20 MG tablet, Lipid panel  Screening for colon cancer  Postsurgical hypothyroidism - Plan: TSH  Screening for diabetes mellitus - Plan: Hemoglobin A1c  Patient seen today for follow-up visit.  Pap as above, lab work pending Refill blood pressure medication, blood pressure under good control Refill lovastatin She uses Ativan at bedtime as needed, refilled for her today Mood symptoms otherwise under good control on current dose of fluoxetine Order Cologuard Will plan further follow- up pending labs.  This visit occurred during the SARS-CoV-2 public health emergency.  Safety protocols were in place, including screening questions prior to the visit, additional usage of staff PPE, and extensive cleaning of exam room while observing appropriate contact time as indicated for disinfecting solutions.    Signed Lamar Blinks, MD  Received labs 2/17- message to pt  Results for orders placed or performed in visit on 08/31/20  Hemoglobin A1c  Result Value Ref Range   Hgb A1c MFr Bld 5.3 4.6 - 6.5 %  Lipid panel  Result Value Ref Range   Cholesterol 206 (H) 0 - 200 mg/dL   Triglycerides 62.0 0.0 - 149.0 mg/dL   HDL 67.30 >39.00 mg/dL   VLDL 12.4 0.0 - 40.0 mg/dL   LDL Cholesterol 126 (H) 0 - 99 mg/dL   Total CHOL/HDL Ratio 3    NonHDL 138.61   TSH  Result Value Ref Range   TSH 4.89 (H) 0.35 - 4.50 uIU/mL

## 2020-08-27 NOTE — Patient Instructions (Incomplete)
Good to see you again today!  I will be in touch with your labs asap We will order a Cologuard kit for you Shingrix at your pharmacy at your convenience   Please see me in 6 months assuming all is well    Health Maintenance After Age 65 After age 51, you are at a higher risk for certain long-term diseases and infections as well as injuries from falls. Falls are a major cause of broken bones and head injuries in people who are older than age 21. Getting regular preventive care can help to keep you healthy and well. Preventive care includes getting regular testing and making lifestyle changes as recommended by your health care provider. Talk with your health care provider about:  Which screenings and tests you should have. A screening is a test that checks for a disease when you have no symptoms.  A diet and exercise plan that is right for you. What should I know about screenings and tests to prevent falls? Screening and testing are the best ways to find a health problem early. Early diagnosis and treatment give you the best chance of managing medical conditions that are common after age 7. Certain conditions and lifestyle choices may make you more likely to have a fall. Your health care provider may recommend:  Regular vision checks. Poor vision and conditions such as cataracts can make you more likely to have a fall. If you wear glasses, make sure to get your prescription updated if your vision changes.  Medicine review. Work with your health care provider to regularly review all of the medicines you are taking, including over-the-counter medicines. Ask your health care provider about any side effects that may make you more likely to have a fall. Tell your health care provider if any medicines that you take make you feel dizzy or sleepy.  Osteoporosis screening. Osteoporosis is a condition that causes the bones to get weaker. This can make the bones weak and cause them to break more  easily.  Blood pressure screening. Blood pressure changes and medicines to control blood pressure can make you feel dizzy.  Strength and balance checks. Your health care provider may recommend certain tests to check your strength and balance while standing, walking, or changing positions.  Foot health exam. Foot pain and numbness, as well as not wearing proper footwear, can make you more likely to have a fall.  Depression screening. You may be more likely to have a fall if you have a fear of falling, feel emotionally low, or feel unable to do activities that you used to do.  Alcohol use screening. Using too much alcohol can affect your balance and may make you more likely to have a fall. What actions can I take to lower my risk of falls? General instructions  Talk with your health care provider about your risks for falling. Tell your health care provider if: ? You fall. Be sure to tell your health care provider about all falls, even ones that seem minor. ? You feel dizzy, sleepy, or off-balance.  Take over-the-counter and prescription medicines only as told by your health care provider. These include any supplements.  Eat a healthy diet and maintain a healthy weight. A healthy diet includes low-fat dairy products, low-fat (lean) meats, and fiber from whole grains, beans, and lots of fruits and vegetables. Home safety  Remove any tripping hazards, such as rugs, cords, and clutter.  Install safety equipment such as grab bars in bathrooms and safety rails  on stairs.  Keep rooms and walkways well-lit. Activity  Follow a regular exercise program to stay fit. This will help you maintain your balance. Ask your health care provider what types of exercise are appropriate for you.  If you need a cane or walker, use it as recommended by your health care provider.  Wear supportive shoes that have nonskid soles.   Lifestyle  Do not drink alcohol if your health care provider tells you not to  drink.  If you drink alcohol, limit how much you have: ? 0-1 drink a day for women. ? 0-2 drinks a day for men.  Be aware of how much alcohol is in your drink. In the [place] one drink equals one typical bottle of beer (12 oz), one-half glass of wine (5 oz), or one shot of hard liquor (1 oz).  Do not use any products that contain nicotine or tobacco, such as cigarettes and e-cigarettes. If you need help quitting, ask your health care provider. Summary  Having a healthy lifestyle and getting preventive care can help to protect your health and wellness after age 62.  Screening and testing are the best way to find a health problem early and help you avoid having a fall. Early diagnosis and treatment give you the best chance for managing medical conditions that are more common for people who are older than age 79.  Falls are a major cause of broken bones and head injuries in people who are older than age 33. Take precautions to prevent a fall at home.  Work with your health care provider to learn what changes you can make to improve your health and wellness and to prevent falls. This information is not intended to replace advice given to you by your health care provider. Make sure you discuss any questions you have with your health care provider. Document Revised: 10/23/2018 Document Reviewed: 05/15/2017 Elsevier Patient Education  2021 [employer].

## 2020-08-31 ENCOUNTER — Ambulatory Visit (INDEPENDENT_AMBULATORY_CARE_PROVIDER_SITE_OTHER): Payer: Medicare HMO | Admitting: Family Medicine

## 2020-08-31 ENCOUNTER — Other Ambulatory Visit: Payer: Self-pay

## 2020-08-31 ENCOUNTER — Encounter: Payer: Self-pay | Admitting: Family Medicine

## 2020-08-31 ENCOUNTER — Other Ambulatory Visit: Payer: Self-pay | Admitting: Family Medicine

## 2020-08-31 ENCOUNTER — Other Ambulatory Visit (HOSPITAL_COMMUNITY)
Admission: RE | Admit: 2020-08-31 | Discharge: 2020-08-31 | Disposition: A | Discharge status: Home / Self Care | Payer: Medicare HMO | Source: Ambulatory Visit | Attending: Family Medicine | Admitting: Family Medicine

## 2020-08-31 VITALS — BP 132/86 | HR 81 | Resp 17 | Ht 62.5 in | Wt 168.0 lb

## 2020-08-31 DIAGNOSIS — E785 Hyperlipidemia, unspecified: Secondary | ICD-10-CM

## 2020-08-31 DIAGNOSIS — I10 Essential (primary) hypertension: Secondary | ICD-10-CM

## 2020-08-31 DIAGNOSIS — E890 Postprocedural hypothyroidism: Secondary | ICD-10-CM | POA: Diagnosis not present

## 2020-08-31 DIAGNOSIS — Z1211 Encounter for screening for malignant neoplasm of colon: Secondary | ICD-10-CM

## 2020-08-31 DIAGNOSIS — Z72820 Sleep deprivation: Secondary | ICD-10-CM

## 2020-08-31 DIAGNOSIS — Z131 Encounter for screening for diabetes mellitus: Secondary | ICD-10-CM

## 2020-08-31 DIAGNOSIS — F5101 Primary insomnia: Secondary | ICD-10-CM

## 2020-08-31 DIAGNOSIS — Z124 Encounter for screening for malignant neoplasm of cervix: Secondary | ICD-10-CM | POA: Diagnosis not present

## 2020-08-31 DIAGNOSIS — Z1151 Encounter for screening for human papillomavirus (HPV): Secondary | ICD-10-CM | POA: Insufficient documentation

## 2020-08-31 LAB — LIPID PANEL
Cholesterol: 206 mg/dL — ABNORMAL HIGH (ref 0–200)
HDL: 67.3 mg/dL (ref >39.00)
LDL Cholesterol: 126 mg/dL — ABNORMAL HIGH (ref 0–99)
NonHDL: 138.61
Total CHOL/HDL Ratio: 3
Triglycerides: 62 mg/dL (ref 0.0–149.0)
VLDL: 12.4 mg/dL (ref 0.0–40.0)

## 2020-08-31 LAB — HEMOGLOBIN A1C: Hgb A1c MFr Bld: 5.3 % (ref 4.6–6.5)

## 2020-08-31 LAB — TSH: TSH: 4.89 u[IU]/mL — ABNORMAL HIGH (ref 0.35–4.50)

## 2020-08-31 MED ORDER — LOVASTATIN 20 MG PO TABS: 20.0000 mg | ORAL_TABLET | Freq: Every day | ORAL | 3 refills | Status: DC

## 2020-08-31 MED ORDER — AMLODIPINE BESY-BENAZEPRIL HCL 5-20 MG PO CAPS: 1.0000 | ORAL_CAPSULE | Freq: Every day | ORAL | 3 refills | Status: DC

## 2020-08-31 MED ORDER — LORAZEPAM 1 MG PO TABS: 1.0000 mg | ORAL_TABLET | Freq: Every evening | ORAL | 2 refills | Status: DC | PRN

## 2020-08-31 MED FILL — LORAZEPAM 1 MG TABS: 1 | 30 days supply | Qty: 30 | Fill #0

## 2020-08-31 MED FILL — AMLODIPINE-BENAZEPRIL 5-20: 5-20 | 90 days supply | Qty: 90 | Fill #0

## 2020-08-31 MED FILL — LOVASTATIN 20 MG TABS: 20 | 90 days supply | Qty: 90 | Fill #0

## 2020-09-01 ENCOUNTER — Encounter: Payer: Self-pay | Admitting: Family Medicine

## 2020-09-01 LAB — CYTOLOGY - PAP
Adequacy: ABSENT
Comment: NEGATIVE
Diagnosis: NEGATIVE
High risk HPV: NEGATIVE

## 2020-09-01 NOTE — Addendum Note (Signed)
Addended by: Lamar Blinks C on: 09/01/2020 05:35 AM   Modules accepted: Orders

## 2020-09-27 DIAGNOSIS — Z85528 Personal history of other malignant neoplasm of kidney: Secondary | ICD-10-CM | POA: Diagnosis not present

## 2020-09-28 ENCOUNTER — Ambulatory Visit: Payer: 59 | Admitting: Internal Medicine

## 2020-10-10 MED FILL — ARMOUR THYROID 60 MG TABLET: 60 | 90 days supply | Qty: 90 | Fill #3

## 2020-10-10 MED FILL — FLUoxetine HCL 20 MG CAPS: 20 | 90 days supply | Qty: 135 | Fill #1

## 2020-10-10 MED FILL — ARMOUR THYROID 15 MG TABLET: 15 | 90 days supply | Qty: 90 | Fill #1

## 2020-10-10 MED FILL — LORAZEPAM 1 MG TABS: 1 | 30 days supply | Qty: 30 | Fill #1

## 2020-10-15 ENCOUNTER — Other Ambulatory Visit (HOSPITAL_BASED_OUTPATIENT_CLINIC_OR_DEPARTMENT_OTHER): Payer: Self-pay

## 2020-10-17 ENCOUNTER — Other Ambulatory Visit (INDEPENDENT_AMBULATORY_CARE_PROVIDER_SITE_OTHER): Payer: Medicare HMO

## 2020-10-17 ENCOUNTER — Encounter: Payer: Self-pay | Admitting: Family Medicine

## 2020-10-17 ENCOUNTER — Other Ambulatory Visit: Payer: Self-pay

## 2020-10-17 DIAGNOSIS — E890 Postprocedural hypothyroidism: Secondary | ICD-10-CM

## 2020-10-17 LAB — TSH: TSH: 0.82 u[IU]/mL (ref 0.35–4.50)

## 2020-10-27 ENCOUNTER — Other Ambulatory Visit: Payer: Self-pay

## 2020-10-27 ENCOUNTER — Inpatient Hospital Stay: Payer: Medicare HMO | Attending: Hematology & Oncology

## 2020-10-27 ENCOUNTER — Encounter: Payer: Self-pay | Admitting: Hematology & Oncology

## 2020-10-27 ENCOUNTER — Inpatient Hospital Stay: Payer: Medicare HMO | Admitting: Hematology & Oncology

## 2020-10-27 VITALS — BP 124/85 | HR 68 | Temp 97.9°F | Resp 18 | Ht 63.0 in | Wt 171.0 lb

## 2020-10-27 DIAGNOSIS — C73 Malignant neoplasm of thyroid gland: Secondary | ICD-10-CM

## 2020-10-27 DIAGNOSIS — D509 Iron deficiency anemia, unspecified: Secondary | ICD-10-CM | POA: Insufficient documentation

## 2020-10-27 DIAGNOSIS — Z880 Allergy status to penicillin: Secondary | ICD-10-CM | POA: Diagnosis not present

## 2020-10-27 DIAGNOSIS — D75839 Thrombocytosis, unspecified: Secondary | ICD-10-CM | POA: Diagnosis not present

## 2020-10-27 DIAGNOSIS — D473 Essential (hemorrhagic) thrombocythemia: Secondary | ICD-10-CM | POA: Diagnosis not present

## 2020-10-27 DIAGNOSIS — Z85520 Personal history of malignant carcinoid tumor of kidney: Secondary | ICD-10-CM | POA: Diagnosis not present

## 2020-10-27 DIAGNOSIS — Z79899 Other long term (current) drug therapy: Secondary | ICD-10-CM | POA: Diagnosis not present

## 2020-10-27 DIAGNOSIS — C641 Malignant neoplasm of right kidney, except renal pelvis: Secondary | ICD-10-CM

## 2020-10-27 DIAGNOSIS — D500 Iron deficiency anemia secondary to blood loss (chronic): Secondary | ICD-10-CM | POA: Diagnosis not present

## 2020-10-27 DIAGNOSIS — K909 Intestinal malabsorption, unspecified: Secondary | ICD-10-CM

## 2020-10-27 LAB — CMP (CANCER CENTER ONLY)
ALT: 11 U/L (ref 0–44)
AST: 15 U/L (ref 15–41)
Albumin: 4.1 g/dL (ref 3.5–5.0)
Alkaline Phosphatase: 45 U/L (ref 38–126)
Anion gap: 7 (ref 5–15)
BUN: 15 mg/dL (ref 8–23)
CO2: 31 mmol/L (ref 22–32)
Calcium: 9.6 mg/dL (ref 8.9–10.3)
Chloride: 101 mmol/L (ref 98–111)
Creatinine: 0.71 mg/dL (ref 0.44–1.00)
GFR, Estimated: >60 mL/min (ref >60)
Glucose, Bld: 95 mg/dL (ref 70–99)
Potassium: 4.4 mmol/L (ref 3.5–5.1)
Sodium: 139 mmol/L (ref 135–145)
Total Bilirubin: 0.4 mg/dL (ref 0.3–1.2)
Total Protein: 7.1 g/dL (ref 6.5–8.1)

## 2020-10-27 LAB — CBC WITH DIFFERENTIAL (CANCER CENTER ONLY)
Abs Immature Granulocytes: 0.02 10*3/uL (ref 0.00–0.07)
Basophils Absolute: 0 10*3/uL (ref 0.0–0.1)
Basophils Relative: 0 %
Eosinophils Absolute: 0.1 10*3/uL (ref 0.0–0.5)
Eosinophils Relative: 1 %
HCT: 42.1 % (ref 36.0–46.0)
Hemoglobin: 13.8 g/dL (ref 12.0–15.0)
Immature Granulocytes: 0 %
Lymphocytes Relative: 30 %
Lymphs Abs: 2.2 10*3/uL (ref 0.7–4.0)
MCH: 28.8 pg (ref 26.0–34.0)
MCHC: 32.8 g/dL (ref 30.0–36.0)
MCV: 87.9 fL (ref 80.0–100.0)
Monocytes Absolute: 0.6 10*3/uL (ref 0.1–1.0)
Monocytes Relative: 9 %
Neutro Abs: 4.3 10*3/uL (ref 1.7–7.7)
Neutrophils Relative %: 60 %
Platelet Count: 442 10*3/uL — ABNORMAL HIGH (ref 150–400)
RBC: 4.79 MIL/uL (ref 3.87–5.11)
RDW: 12.8 % (ref 11.5–15.5)
WBC Count: 7.2 10*3/uL (ref 4.0–10.5)
nRBC: 0 % (ref 0.0–0.2)

## 2020-10-27 LAB — IRON AND TIBC
Iron: 89 ug/dL (ref 41–142)
Saturation Ratios: 28 % (ref 21–57)
TIBC: 320 ug/dL (ref 236–444)
UIBC: 231 ug/dL (ref 120–384)

## 2020-10-27 LAB — SAVE SMEAR(SSMR), FOR PROVIDER SLIDE REVIEW

## 2020-10-27 LAB — FERRITIN: Ferritin: 309 ng/mL — ABNORMAL HIGH (ref 11–307)

## 2020-10-27 NOTE — Progress Notes (Signed)
Hematology and Oncology Follow Up Visit  Karen Wall [id number] November 10, 1955 65 y.o. 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  Overall, she is doing okay.  Unfortunately, she is having problems with her left ankle.  This is been a chronic issue for her.  She has a brace on the ankle.  She is going have a nice Easter.  Her son is coming into town.  She had no problems over the wintertime.  She had no issues with Covid.  She had no problems with nausea or vomiting.  She had no problems with fatigue or weakness.  Her iron studies we last saw her back in December showed a ferritin of 286 with an iron saturation of 31%.  She is on baby aspirin.  She is doing okay on the baby aspirin.  Overall, her performance status is ECOG 1.  Medications:  Current Outpatient Medications:  .  acetaminophen (TYLENOL) 500 MG tablet, Take 500 mg by mouth every 6 (six) hours as needed for pain., Disp: , Rfl:  .  amLODipine-benazepril (LOTREL) 5-20 MG capsule, Take 1 capsule by mouth at bedtime., Disp: 90 capsule, Rfl: 1 .  LORazepam (ATIVAN) 1 MG tablet, Take 1 tablet (1 mg total) by mouth at bedtime as needed for anxiety or sleep., Disp: 30 tablet, Rfl: 0 .  lovastatin (MEVACOR) 20 MG tablet, Take 1 tablet (20 mg total) by mouth at bedtime., Disp: 90 tablet, Rfl: 3 .  thyroid (ARMOUR THYROID) 15 MG tablet, Take 1 tablet (15 mg total) by mouth daily. Along with the 60 mg tablet. (Patient taking differently: Take 15 mg by mouth daily before breakfast. Along with the 60 mg tablet.), Disp: 90  tablet, Rfl: 3 .  thyroid (ARMOUR THYROID) 60 MG tablet, Take every day along with the 15 mg tablet. (Patient taking differently: Take 60 mg by mouth daily before breakfast. Take every day along with the 15 mg tablet.), Disp: 90 tablet, Rfl: 3 .  traMADol (ULTRAM) 50 MG tablet, Take 1-2 tablets (50-100 mg total) by mouth every 6 (six) hours as needed for moderate pain or severe pain., Disp: 20 tablet, Rfl: 0  Allergies:       Allergies  Allergen Reactions  . Penicillins Shortness Of Breath    Did it involve swelling of the face/tongue/throat, SOB, or low BP? Yes Did it involve sudden or severe rash/hives, skin peeling, or any reaction on the inside of your mouth or nose? No Did you need to seek medical attention at a hospital or doctor's office? Unknown When did it last happen?5-55 years old If all above answers are "NO", may proceed with cephalosporin use.     Past Medical History, Surgical history, Social history, and Family History were reviewed and updated.  Review of Systems: Review of Systems  Constitutional: Negative.   HENT:  Negative.  Eyes: Negative.   Respiratory: Negative.   Cardiovascular: Negative.   Gastrointestinal: Negative.   Endocrine: Negative.   Genitourinary: Negative.    Musculoskeletal: Negative.   Skin: Negative.   Neurological: Negative.   Hematological: Negative.   Psychiatric/Behavioral: Negative.     Physical Exam: Vital signs this visit show temperature of 97.1.  Pulse is 78.  Blood pressure 100/66.  Weight is 209 pounds.     Wt Readings from Last 3 Encounters:  09/25/18 213 lb (96.6 kg)  09/23/18 213 lb (96.6 kg)  09/08/18 198 lb (89.8 kg)    Physical Exam Vitals signs reviewed.  HENT:     Head: Normocephalic and atraumatic.  Eyes:     Pupils: Pupils are equal, round, and reactive to light.  Neck:     Musculoskeletal: Normal range of motion.  Cardiovascular:     Rate and Rhythm: Normal rate and regular rhythm.      Heart sounds: Normal heart sounds.  Pulmonary:     Effort: Pulmonary effort is normal.     Breath sounds: Normal breath sounds.  Abdominal:     General: Bowel sounds are normal.     Palpations: Abdomen is soft.  Musculoskeletal: Normal range of motion.        General: No tenderness or deformity.  Lymphadenopathy:     Cervical: No cervical adenopathy.  Skin:    General: Skin is warm and dry.     Findings: No erythema or rash.  Neurological:     Mental Status: She is alert and oriented to person, place, and time.  Psychiatric:        Behavior: Behavior normal.        Thought Content: Thought content normal.        Judgment: Judgment normal.      RecentLabs       Lab Results  Component Value Date   WBC 7.3 09/23/2018   HGB 12.0 09/26/2018   HCT 39.4 09/26/2018   MCV 87.6 09/23/2018   PLT 479 (H) 09/23/2018       Chemistry   Labs(Brief)          Component Value Date/Time   NA 137 09/26/2018 0339   K 4.0 09/26/2018 0339   CL 105 09/26/2018 0339   CO2 24 09/26/2018 0339   BUN 9 09/26/2018 0339   CREATININE 0.70 09/26/2018 0339   CREATININE 0.81 09/08/2018 1148   CREATININE 0.74 05/18/2015 1356     Labs(Brief)          Component Value Date/Time   CALCIUM 8.6 (L) 09/26/2018 0339   ALKPHOS 52 09/08/2018 1148   AST 13 (L) 09/08/2018 1148   ALT 13 09/08/2018 1148   BILITOT 0.2 (L) 09/08/2018 1148        Impression and Plan: Karen Wall is a 65 year old white female.    She has thrombocytosis.  Her platelet count is holding pretty steady right now.  We will see what her iron studies look like.  She is totally asymptomatic.  I still think we can get her back in 4 months.  I forgot to mention that she is followed by Dr. Alinda Money of urology for her renal cell carcinoma.  Is been 2 years now since she had surgery on the right kidney.  He released her to once a year.  She apparently has some recent CT scans which all looked fine.     [name], MD 6/11/202012:28 PM

## 2020-10-28 ENCOUNTER — Encounter: Payer: Self-pay | Admitting: *Deleted

## 2021-01-02 ENCOUNTER — Other Ambulatory Visit (HOSPITAL_BASED_OUTPATIENT_CLINIC_OR_DEPARTMENT_OTHER): Payer: Self-pay

## 2021-01-02 ENCOUNTER — Encounter: Payer: Self-pay | Admitting: Family Medicine

## 2021-01-02 MED ORDER — THYROID 60 MG PO TABS
60.0000 mg | ORAL_TABLET | Freq: Every day | ORAL | 1 refills | Status: DC
  Filled 2021-01-02: qty 90, 90d supply, fill #0
  Filled 2021-04-12: qty 90, 90d supply, fill #1

## 2021-01-09 ENCOUNTER — Other Ambulatory Visit (HOSPITAL_BASED_OUTPATIENT_CLINIC_OR_DEPARTMENT_OTHER): Payer: Self-pay

## 2021-01-09 MED FILL — Thyroid Tab 15 MG (1/4 Grain): ORAL | 90 days supply | Qty: 90 | Fill #0 | Status: AC

## 2021-01-09 MED FILL — Amlodipine Besylate-Benazepril HCl Cap 5-20 MG: ORAL | 90 days supply | Qty: 90 | Fill #0 | Status: AC

## 2021-01-09 MED FILL — Lorazepam Tab 1 MG: ORAL | 30 days supply | Qty: 30 | Fill #0 | Status: AC

## 2021-01-09 MED FILL — Fluoxetine HCl Cap 20 MG: ORAL | 90 days supply | Qty: 135 | Fill #0 | Status: AC

## 2021-01-09 MED FILL — Lovastatin Tab 20 MG: ORAL | 90 days supply | Qty: 90 | Fill #0 | Status: AC

## 2021-02-22 ENCOUNTER — Other Ambulatory Visit: Payer: Self-pay

## 2021-02-22 ENCOUNTER — Encounter: Payer: Self-pay | Admitting: Hematology & Oncology

## 2021-02-22 ENCOUNTER — Inpatient Hospital Stay: Payer: Medicare HMO | Attending: Hematology & Oncology

## 2021-02-22 ENCOUNTER — Inpatient Hospital Stay (HOSPITAL_BASED_OUTPATIENT_CLINIC_OR_DEPARTMENT_OTHER): Payer: Medicare HMO | Admitting: Hematology & Oncology

## 2021-02-22 VITALS — BP 106/68 | HR 84 | Temp 98.6°F | Resp 18 | Ht 63.0 in | Wt 186.4 lb

## 2021-02-22 DIAGNOSIS — D75839 Thrombocytosis, unspecified: Secondary | ICD-10-CM | POA: Insufficient documentation

## 2021-02-22 DIAGNOSIS — Z79899 Other long term (current) drug therapy: Secondary | ICD-10-CM | POA: Insufficient documentation

## 2021-02-22 DIAGNOSIS — C641 Malignant neoplasm of right kidney, except renal pelvis: Secondary | ICD-10-CM

## 2021-02-22 DIAGNOSIS — D509 Iron deficiency anemia, unspecified: Secondary | ICD-10-CM | POA: Insufficient documentation

## 2021-02-22 DIAGNOSIS — D696 Thrombocytopenia, unspecified: Secondary | ICD-10-CM | POA: Diagnosis not present

## 2021-02-22 DIAGNOSIS — D500 Iron deficiency anemia secondary to blood loss (chronic): Secondary | ICD-10-CM

## 2021-02-22 LAB — CBC WITH DIFFERENTIAL (CANCER CENTER ONLY)
Abs Immature Granulocytes: 0.01 10*3/uL (ref 0.00–0.07)
Basophils Absolute: 0 10*3/uL (ref 0.0–0.1)
Basophils Relative: 1 %
Eosinophils Absolute: 0.1 10*3/uL (ref 0.0–0.5)
Eosinophils Relative: 2 %
HCT: 44.2 % (ref 36.0–46.0)
Hemoglobin: 14.6 g/dL (ref 12.0–15.0)
Immature Granulocytes: 0 %
Lymphocytes Relative: 37 %
Lymphs Abs: 2.5 10*3/uL (ref 0.7–4.0)
MCH: 28.7 pg (ref 26.0–34.0)
MCHC: 33 g/dL (ref 30.0–36.0)
MCV: 86.8 fL (ref 80.0–100.0)
Monocytes Absolute: 0.7 10*3/uL (ref 0.1–1.0)
Monocytes Relative: 10 %
Neutro Abs: 3.4 10*3/uL (ref 1.7–7.7)
Neutrophils Relative %: 50 %
Platelet Count: 430 10*3/uL — ABNORMAL HIGH (ref 150–400)
RBC: 5.09 MIL/uL (ref 3.87–5.11)
RDW: 13.5 % (ref 11.5–15.5)
WBC Count: 6.7 10*3/uL (ref 4.0–10.5)
nRBC: 0 % (ref 0.0–0.2)

## 2021-02-22 LAB — CMP (CANCER CENTER ONLY)
ALT: 13 U/L (ref 0–44)
AST: 15 U/L (ref 15–41)
Albumin: 3.8 g/dL (ref 3.5–5.0)
Alkaline Phosphatase: 50 U/L (ref 38–126)
Anion gap: 9 (ref 5–15)
BUN: 13 mg/dL (ref 8–23)
CO2: 28 mmol/L (ref 22–32)
Calcium: 9.4 mg/dL (ref 8.9–10.3)
Chloride: 102 mmol/L (ref 98–111)
Creatinine: 0.61 mg/dL (ref 0.44–1.00)
GFR, Estimated: >60 mL/min (ref >60)
Glucose, Bld: 87 mg/dL (ref 70–99)
Potassium: 4.4 mmol/L (ref 3.5–5.1)
Sodium: 139 mmol/L (ref 135–145)
Total Bilirubin: 0.4 mg/dL (ref 0.3–1.2)
Total Protein: 6.8 g/dL (ref 6.5–8.1)

## 2021-02-22 LAB — IRON AND TIBC
Iron: 114 ug/dL (ref 28–170)
Saturation Ratios: 31 % (ref 10.4–31.8)
TIBC: 363 ug/dL (ref 250–450)
UIBC: 249 ug/dL

## 2021-02-22 LAB — LACTATE DEHYDROGENASE: LDH: 146 U/L (ref 98–192)

## 2021-02-22 LAB — FERRITIN: Ferritin: 177 ng/mL (ref 11–307)

## 2021-02-22 LAB — SAVE SMEAR(SSMR), FOR PROVIDER SLIDE REVIEW

## 2021-02-22 NOTE — Progress Notes (Signed)
Hematology and Oncology Follow Up Visit   VIRIDIANA HOLLERN [id number] 1955-11-28 65 y.o. 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 Talamantes is in for follow-up.  We saw her 4 months ago.  She has been doing quite well.  She has had no problems with her thrombocythemia nor with her renal cell carcinoma.  She does have some bruising.  This might be from the baby aspirin that she takes.  She has had no obvious bleeding.  There is been no change in bowel or bladder habits.  She has had no issues with cough.  She has had no nausea or vomiting.  There has been no leg swelling.  Her iron studies that we did back in April showed a ferritin of 309 with an iron saturation of 28%.  Currently, I would say her performance status is probably ECOG 1.    Medications:  Current Outpatient Medications:    acetaminophen (TYLENOL) 500 MG tablet, Take 500 mg by mouth every 6 (six) hours as needed for pain., Disp: , Rfl:    amLODipine-benazepril (LOTREL) 5-20 MG capsule, Take 1 capsule by mouth at bedtime., Disp: 90 capsule, Rfl: 1   LORazepam (ATIVAN) 1 MG tablet, Take 1 tablet (1 mg total) by mouth at bedtime as needed for anxiety or sleep., Disp: 30 tablet, Rfl: 0   lovastatin (MEVACOR) 20 MG tablet, Take 1 tablet (20 mg total) by mouth at bedtime., Disp: 90 tablet, Rfl: 3   thyroid (ARMOUR THYROID) 15 MG tablet, Take 1 tablet (15 mg total) by mouth daily. Along with the 60 mg tablet. (Patient taking differently: Take 15 mg by mouth daily before breakfast. Along with the 60 mg tablet.), Disp: 90 tablet, Rfl: 3   thyroid (ARMOUR THYROID)  60 MG tablet, Take every day along with the 15 mg tablet. (Patient taking differently: Take 60 mg by mouth daily before breakfast. Take every day along with the 15 mg tablet.), Disp: 90 tablet, Rfl: 3   traMADol (ULTRAM) 50 MG tablet, Take 1-2 tablets (50-100 mg total) by mouth every 6 (six) hours as needed for moderate pain or severe pain., Disp: 20 tablet, Rfl: 0   Allergies:       Allergies  Allergen Reactions   Penicillins Shortness Of Breath      Did it involve swelling of the face/tongue/throat, SOB, or low BP? Yes Did it involve sudden or severe rash/hives, skin peeling, or any reaction on the inside of your mouth or nose? No Did you need to seek medical attention at a hospital or doctor's office? Unknown When did it last happen?  57-23 years old     If all above answers are "NO", may proceed with cephalosporin use.        Past Medical History, Surgical history, Social history, and Family History were reviewed and updated.   Review of Systems: Review of Systems  Constitutional: Negative.  HENT:  Negative.   Eyes: Negative.   Respiratory: Negative.   Cardiovascular: Negative.   Gastrointestinal: Negative.   Endocrine: Negative.   Genitourinary: Negative.    Musculoskeletal: Negative.   Skin: Negative.   Neurological: Negative.   Hematological: Negative.   Psychiatric/Behavioral: Negative.       Physical Exam: Vital signs this visit show temperature of 97.1.  Pulse is 78.  Blood pressure 100/66.  Weight is 209 pounds.      Wt Readings from Last 3 Encounters:  09/25/18 213 lb (96.6 kg)  09/23/18 213 lb (96.6 kg)  09/08/18 198 lb (89.8 kg)      Physical Exam Vitals signs reviewed.  HENT:     Head: Normocephalic and atraumatic.  Eyes:     Pupils: Pupils are equal, round, and reactive to light.  Neck:     Musculoskeletal: Normal range of motion.  Cardiovascular:     Rate and Rhythm: Normal rate and regular rhythm.     Heart sounds: Normal heart sounds.   Pulmonary:     Effort: Pulmonary effort is normal.     Breath sounds: Normal breath sounds.  Abdominal:     General: Bowel sounds are normal.     Palpations: Abdomen is soft.  Musculoskeletal: Normal range of motion.        General: No tenderness or deformity.  Lymphadenopathy:     Cervical: No cervical adenopathy.  Skin:    General: Skin is warm and dry.     Findings: No erythema or rash.  Neurological:     Mental Status: She is alert and oriented to person, place, and time.  Psychiatric:        Behavior: Behavior normal.        Thought Content: Thought content normal.        Judgment: Judgment normal.          Recent Labs       Lab Results  Component Value Date    WBC 7.3 09/23/2018    HGB 12.0 09/26/2018    HCT 39.4 09/26/2018    MCV 87.6 09/23/2018    PLT 479 (H) 09/23/2018        Chemistry    Labs (Brief)          Component Value Date/Time    NA 137 09/26/2018 0339    K 4.0 09/26/2018 0339    CL 105 09/26/2018 0339    CO2 24 09/26/2018 0339    BUN 9 09/26/2018 0339    CREATININE 0.70 09/26/2018 0339    CREATININE 0.81 09/08/2018 1148    CREATININE 0.74 05/18/2015 1356      Labs (Brief)          Component Value Date/Time    CALCIUM 8.6 (L) 09/26/2018 0339    ALKPHOS 52 09/08/2018 1148    AST 13 (L) 09/08/2018 1148    ALT 13 09/08/2018 1148    BILITOT 0.2 (L) 09/08/2018 1148           Impression and Plan: Ms. Padden is a 65 year old white female.    She has thrombocythemia.  Everything is holding pretty steady right now.  I looked at her blood smear.  I did not see anything on the blood smear that looked suspicious.  She follows up with urology for the renal cell carcinoma.  Its been about 2 years since she had the surgery.  I think we can now get her back after the holidays.  We will get her  back in January 2023.  I think this would be a reasonable follow-up.   [name], MD 6/11/202012:28 PM

## 2021-02-25 NOTE — Patient Instructions (Addendum)
Good to see you again today!  Please consider getting the shingles vaccine/ shingrix at your convenience  Pneumonia vaccine given today Please see me in about 6 months

## 2021-02-25 NOTE — Progress Notes (Signed)
[facility] at [facility] [address] [place],  [address] [phone]  Date:  03/01/2021   Name:  Karen Wall   DOB:  August 14, 1955   MRN:  [id number]  PCP:  Darreld Mclean, MD    Chief Complaint: Hypertension (6 month follow up)   History of Present Illness:  Karen Wall is a 65 y.o. very pleasant female patient who presents with the following:  Here today for a periodic recheck visit History of thyroid cancer s/p thyroidectomy 2014, HTN, renal cancer s/p partial nephrectomy 09/2018, iron def, essential thrombocytosis.  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  Losing him will be tough as he is a remembrance of her mother She is using prozac - this does help with her depression sx She takes lorazepam 1 mg at bedtime in order to sleep.  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   Thrombocytosis 11/22/2017   Bilateral ankle pain 10/15/2017   IDA (iron deficiency anemia) 07/23/2016   Vitamin D deficiency 03/15/2016   Low vitamin B12 level 03/15/2016   Dyspnea 05/28/2014   Chest tightness 05/28/2014   Thyroid cancer, 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   Postsurgical hypothyroidism 07/24/2013    Past Medical History:  Diagnosis Date   A-fib [facility])    history of   Acquired flat foot    Ankle pain    bilateral   Arthritis    feet ankles, and knees    Cancer of kidney parenchyma, right ([place]) 09/08/2018   Depression    Diverticulosis    Dyspnea    with exertion    Dysrhythmia    hx of afib with hyperthroid no issues currently    GERD (gastroesophageal reflux disease)    H/O hiatal hernia 08/09/2018   Moderate to Large   Hyperlipidemia    Hypertension    Hypothyroidism    Iron deficiency anemia    iron deficiency last iron infusion 08/2018   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   Numbness    Left side of face around mouth - unknown etiology   Numbness and tingling  left side - facial   Palpitations    Occurred during hyperthyroid   PONV (postoperative nausea and vomiting)    Right renal mass 07/2018   2.1 cm solid heterogeneously enhancing mass in posterior midpole    Sinus tachycardia by electrocardiogram 05/12/2018   Thrombocythemia    Thrombocytosis 11/22/2017   Thyroid cancer ([place])    Thyroid disease    hyperthyroidism   Vitamin B 12 deficiency    Vitamin D deficiency     Past Surgical History:  Procedure Laterality Date   BREAST BIOPSY     age 78 - negative   CHOLECYSTECTOMY  1992   OPERATIVE ULTRASOUND Right 09/25/2018   Procedure: OPERATIVE ULTRASOUND;  Surgeon: Raynelle Bring, MD;  Location: [facility];  Service: Urology;  Laterality:  Right;   ROBOTIC ASSITED PARTIAL NEPHRECTOMY Right 09/25/2018   Procedure: XI ROBOTIC RIGHT  ASSITED PARTIAL NEPHRECTOMY;  Surgeon: Raynelle Bring, MD;  Location: [facility];  Service: Urology;  Laterality: Right;   THYROIDECTOMY N/A 07/03/2013   Procedure: TOTAL THYROIDECTOMY;  Surgeon: Earnstine Regal, MD;  Location: [facility];  Service: General;  Laterality: N/A;    Social History   Tobacco Use   Smoking status: Never   Smokeless tobacco: Never  Vaping Use   Vaping Use: Never used  Substance Use Topics   Alcohol use: No    Alcohol/week: 0.0 standard drinks   Drug use: No    Family History  Problem Relation Age of Onset   Hypertension Mother    Diabetes Mother    Hyperlipidemia Mother    COPD Mother    CAD Father        MI in his 77s   Diabetes Father    Hyperlipidemia Father    Hypertension Father    Mental illness Father    Mental illness Maternal Grandmother    Stroke Maternal Grandmother    Diabetes Maternal Grandmother    Stroke Maternal Grandfather    Heart attack Maternal Grandfather    Congestive Heart Failure Paternal Grandmother    Heart attack Paternal Grandfather     Allergies  Allergen Reactions   Penicillins Shortness Of Breath    Did it involve swelling of the face/tongue/throat, SOB, or low BP? Yes Did it involve sudden or severe rash/hives, skin peeling, or any reaction on the inside of your mouth or nose? No Did you need to seek medical attention at a hospital or doctor's office? Unknown When did it last happen?  23-69 years old     If all above answers are "NO", may proceed with cephalosporin use.     Medication list has been reviewed and updated.  Current Outpatient Medications on File Prior to Visit  Medication Sig Dispense Refill   acetaminophen (TYLENOL) 500 MG tablet Take 500 mg by mouth every 6 (six) hours as needed for pain.     amLODipine-benazepril (LOTREL) 5-20 MG capsule TAKE 1 CAPSULE BY MOUTH AT BEDTIME. 90 capsule 3   ARMOUR THYROID 15  MG tablet TAKE 1 TABLET BY MOUTH ONCE DAILY ALONG WITH A 60 MG TABLET TO EQUAL 75 MG DOSE 90 tablet 3   aspirin 81 MG EC tablet Take 81 mg by mouth daily. Swallow whole.     Cholecalciferol (VITAMIN D3) 1.25 MG (50000 UT) TABS Take 5,000 Units by mouth.     CYANOCOBALAMIN SL Place under the tongue.     FLUoxetine (PROZAC) 20 MG capsule ALTERNATE BETWEEN TAKING 1 CAPSULE BY MOUTH DAILY AND 2 CAPSULES BY  MOUTH DAILY 135 capsule 3   lovastatin (MEVACOR) 20 MG tablet TAKE 1 TABLET (20 MG TOTAL) BY MOUTH AT BEDTIME. 90 tablet 3   thyroid (ARMOUR) 60 MG tablet Take 1 tablet (60 mg total) by mouth daily before breakfast. 90 tablet 1   hydroxyurea (HYDREA) 500 MG capsule Take 1 capsule (500 mg total) by mouth daily. May take with food to minimize GI side effects. (Patient not taking: Reported on 03/01/2021) 30 capsule 6   No current facility-administered medications on file prior to visit.    Review of Systems:  As per HPI- otherwise negative.   Physical Examination: Vitals:   03/01/21 1008 03/01/21 1027  BP: (!) 88/60 110/82  Pulse: 86   Resp: 17   Temp: (!) 97 F (36.1 C)   SpO2: 96%    Vitals:   03/01/21 1008  Weight: 184 lb (83.5 kg)  Height: '5\' 3"'$  (1.6 m)   Body mass index is 32.59 kg/m. Ideal Body Weight: Weight in (lb) to have BMI = 25: 140.8  Blood pressure normal on recheck.  Patient denies any lightheadedness or presyncope GEN: no acute distress.  Obese, looks well HEENT: Atraumatic, Normocephalic.  Ears and Nose: No external deformity. CV: RRR, No M/G/R. No JVD. No thrill. No extra heart sounds. PULM: CTA B, no wheezes, crackles, rhonchi. No retractions. No resp. distress. No accessory muscle use. ABD: S, NT, ND No rebound. No HSM. EXTR: No c/c/e PSYCH: Normally interactive. Conversant.   BP Readings from Last 3 Encounters:  03/01/21 110/82  02/22/21 106/68  10/27/20 124/85    Assessment and Plan: Immunization due - Plan: Pneumococcal conjugate vaccine 20-valent  (Prevnar 20)  Primary insomnia  Essential hypertension  Hyperlipidemia, unspecified hyperlipidemia type  Postsurgical hypothyroidism  Reactive depression Following up today.  Blood pressures under good control. Continue Prozac Using lorazepam 1 mg at bedtime for insomnia TSH checked in April, deferred today Follow-up in 6 months This visit occurred during the SARS-CoV-2 public health emergency.  Safety protocols were in place, including screening questions prior to the visit, additional usage of staff PPE, and extensive cleaning of exam room while observing appropriate contact time as indicated for disinfecting solutions.   Signed Lamar Blinks, MD

## 2021-03-01 ENCOUNTER — Other Ambulatory Visit: Payer: Self-pay

## 2021-03-01 ENCOUNTER — Ambulatory Visit (INDEPENDENT_AMBULATORY_CARE_PROVIDER_SITE_OTHER): Payer: Medicare HMO | Admitting: Family Medicine

## 2021-03-01 VITALS — BP 110/82 | HR 86 | Temp 97.0°F | Resp 17 | Ht 63.0 in | Wt 184.0 lb

## 2021-03-01 DIAGNOSIS — Z23 Encounter for immunization: Secondary | ICD-10-CM

## 2021-03-01 DIAGNOSIS — F329 Major depressive disorder, single episode, unspecified: Secondary | ICD-10-CM | POA: Diagnosis not present

## 2021-03-01 DIAGNOSIS — F5101 Primary insomnia: Secondary | ICD-10-CM

## 2021-03-01 DIAGNOSIS — I10 Essential (primary) hypertension: Secondary | ICD-10-CM

## 2021-03-01 DIAGNOSIS — E785 Hyperlipidemia, unspecified: Secondary | ICD-10-CM

## 2021-03-01 DIAGNOSIS — E890 Postprocedural hypothyroidism: Secondary | ICD-10-CM | POA: Diagnosis not present

## 2021-04-12 ENCOUNTER — Other Ambulatory Visit: Payer: Self-pay | Admitting: Internal Medicine

## 2021-04-12 ENCOUNTER — Other Ambulatory Visit: Payer: Self-pay | Admitting: Family Medicine

## 2021-04-12 DIAGNOSIS — F5101 Primary insomnia: Secondary | ICD-10-CM

## 2021-04-12 DIAGNOSIS — Z72820 Sleep deprivation: Secondary | ICD-10-CM

## 2021-04-13 ENCOUNTER — Other Ambulatory Visit (HOSPITAL_BASED_OUTPATIENT_CLINIC_OR_DEPARTMENT_OTHER): Payer: Self-pay

## 2021-04-13 ENCOUNTER — Other Ambulatory Visit: Payer: Self-pay | Admitting: Family Medicine

## 2021-04-13 ENCOUNTER — Encounter: Payer: Self-pay | Admitting: Family Medicine

## 2021-04-13 MED ORDER — LORAZEPAM 1 MG PO TABS
ORAL_TABLET | ORAL | 2 refills | Status: AC
Start: 2021-04-13 — End: 2021-10-10
  Filled 2021-04-13: qty 30, 30d supply, fill #0
  Filled 2021-06-05: qty 30, 30d supply, fill #1
  Filled 2021-09-06: qty 30, 30d supply, fill #2

## 2021-04-13 MED FILL — Lovastatin Tab 20 MG: ORAL | 90 days supply | Qty: 90 | Fill #1 | Status: AC

## 2021-04-13 MED FILL — Amlodipine Besylate-Benazepril HCl Cap 5-20 MG: ORAL | 90 days supply | Qty: 90 | Fill #1 | Status: AC

## 2021-04-14 ENCOUNTER — Other Ambulatory Visit (HOSPITAL_BASED_OUTPATIENT_CLINIC_OR_DEPARTMENT_OTHER): Payer: Self-pay

## 2021-04-14 MED ORDER — THYROID 60 MG PO TABS
60.0000 mg | ORAL_TABLET | Freq: Every day | ORAL | 1 refills | Status: DC
  Filled 2021-04-14 – 2021-07-04 (×2): qty 90, 90d supply, fill #0
  Filled 2021-10-11: qty 90, 90d supply, fill #1

## 2021-04-17 ENCOUNTER — Other Ambulatory Visit (HOSPITAL_BASED_OUTPATIENT_CLINIC_OR_DEPARTMENT_OTHER): Payer: Self-pay

## 2021-04-17 ENCOUNTER — Other Ambulatory Visit: Payer: Self-pay | Admitting: Family Medicine

## 2021-04-17 MED ORDER — ARMOUR THYROID 15 MG PO TABS
ORAL_TABLET | ORAL | 3 refills | Status: DC
  Filled 2021-04-17: qty 90, 90d supply, fill #0
  Filled 2021-07-04: qty 90, 90d supply, fill #1

## 2021-05-07 IMAGING — MR MR ABDOMEN WO/W CM
9 of 18 series · 21 of 48 positions shown · IV contrast (gadavist)
Comparison: CT abdomen 08/25/2019, MRI 08/09/2018 is

CLINICAL DATA: Partial RIGHT nephrectomy.  Nephrectomy 6969.

EXAM:
MRI ABDOMEN WITHOUT AND WITH CONTRAST
TECHNIQUE: Multiplanar multisequence MR imaging of the abdomen was performed
both before and after the administration of intravenous contrast.
CONTRAST:  8mL GADAVIST GADOBUTROL 1 MMOL/ML IV SOLN

[Series 4: T2 · coronal · 6.0mm · 1.41mm/px · 1 of 33 slices shown (1 of 2)]
[im 1/33]
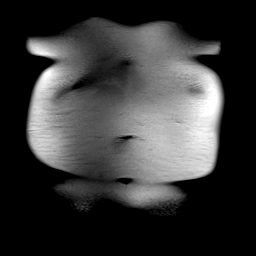

[Series 5: T2 fat-sat · axial · 6.0mm · 1.41mm/px · z∈[-125,+108]mm · 2 of 32 slices shown]
[im 1/32]
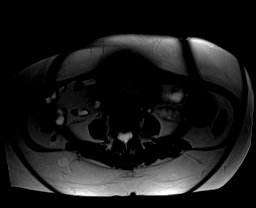
[im 32/32]
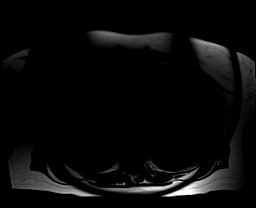

[Series 6: acr-in + out · axial · 6.0mm · 0.74mm/px · z∈[-159,+73]mm · 3 of 64 slices shown]
[im 1/64]
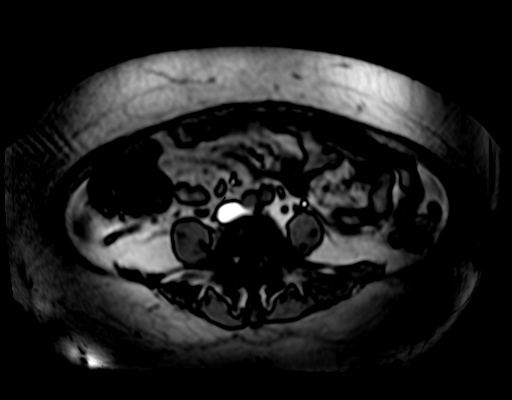
[im 32/64]
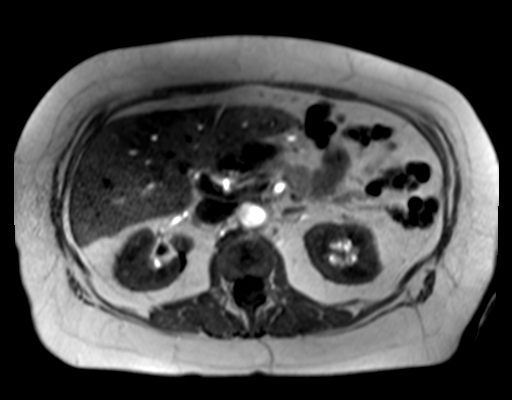
[im 64/64]
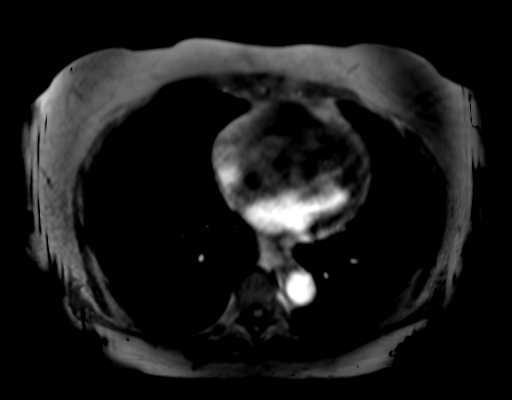

[Series 7: ep2d_diff_b50_500_800 free breathing · axial · 6.0mm · 1.98mm/px · z∈[-125,+108]mm · 5 of 96 slices shown]
[im 1/96]
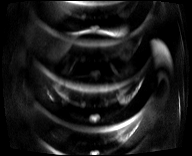
[im 24/96]
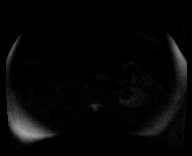
[im 48/96]
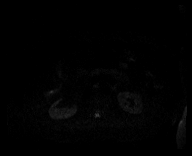
[im 72/96]
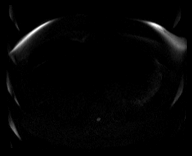
[im 96/96]
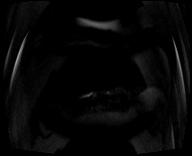

[Series 8: ep2d_diff_b50_500_800 free breathing_adc · axial · 6.0mm · 1.98mm/px · z∈[-125,+108]mm · 2 of 32 slices shown]
[im 1/32]
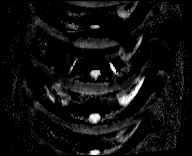
[im 32/32]
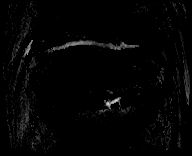

[Series 9: DWI · axial · 6.0mm · 0.74mm/px · z∈[-159,+73]mm · 2 of 32 slices shown]
[im 1/32]
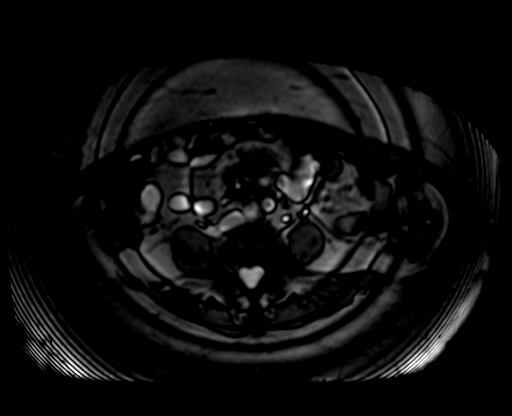
[im 32/32]
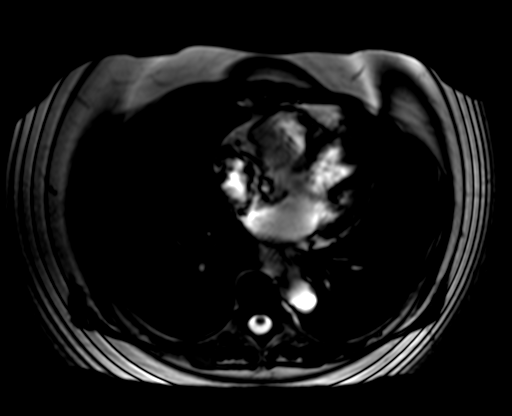

[Series 18: T2 · axial · 6.0mm · 1.48mm/px · z∈[-159,+73]mm · 2 of 32 slices shown (2 of 2)]
[im 1/32]
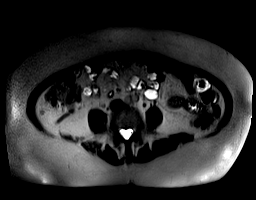
[im 32/32]
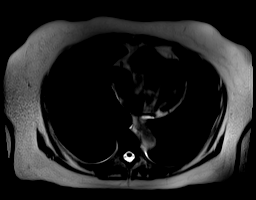

[Series 5002: sub_s19-s10_1 · axial · 4.0mm · 0.74mm/px · z∈[-157,+79]mm · 3 of 60 slices shown]
[im 1/60]
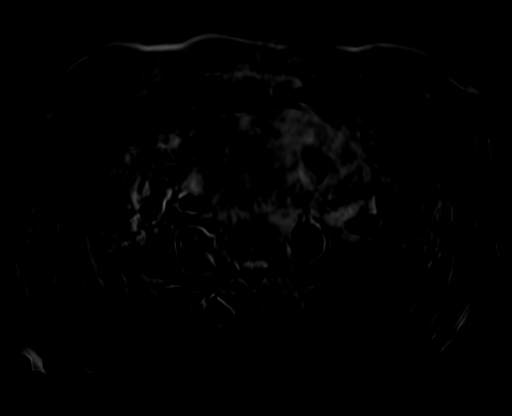
[im 30/60]
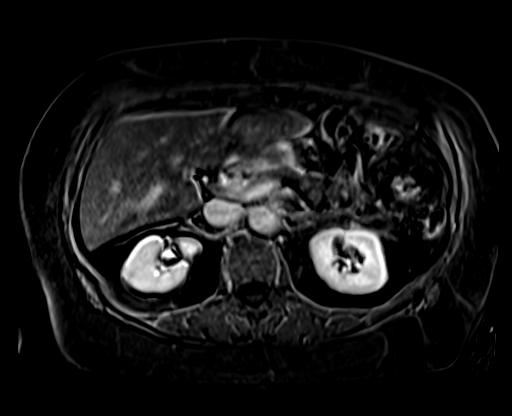
[im 60/60]
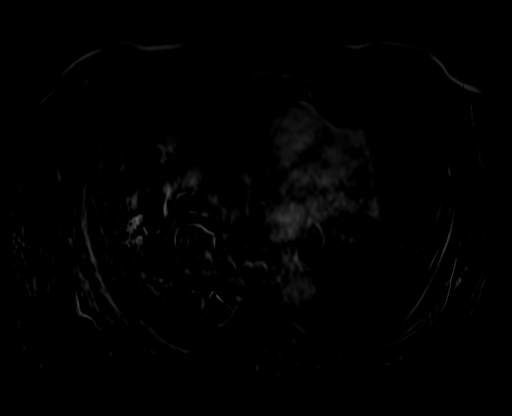

[Series 5003: hx · axial · 6.0mm · 0.78mm/px · 1 of 22 slices shown]
[im 1/22]
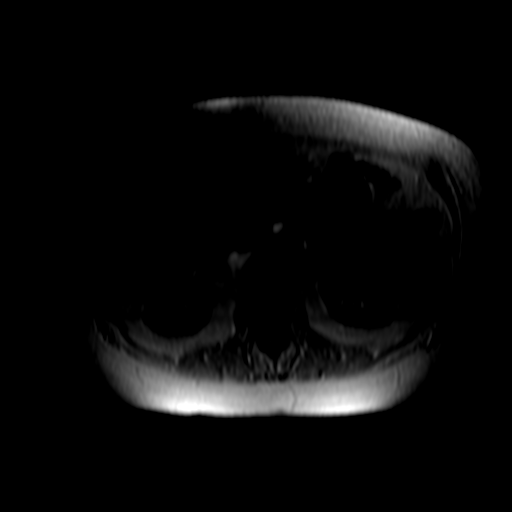

[21 of 48 positions shown; findings below may reference images not displayed]

FINDINGS: Lower chest:  Lung bases are clear.

Hepatobiliary: Postcholecystectomy. No biliary duct dilatation. No
enhancing hepatic lesion.

Pancreas: Normal pancreatic parenchymal intensity. No ductal
dilatation or inflammation.

Spleen: Normal spleen.

Adrenals/urinary tract: Adrenal glands normal. Post partial RIGHT
nephrectomy. No nodularity or growth at the partial nephrectomy
margin in the medial upper pole RIGHT kidney. No enhancing lesion in
LEFT or RIGHT kidney.

Stomach/Bowel: Large hiatal hernia. Stomach limited view of the
bowel is unremarkable.

Vascular/Lymphatic: No retroperitoneal lymphadenopathy.

Musculoskeletal: No skeletal metastasis
IMPRESSION: 1. No evidence of carcinoma recurrence at the RIGHT partial
nephrectomy site.
2. No lymphadenopathy.
3. Large hiatal hernia

## 2021-05-25 ENCOUNTER — Telehealth: Payer: Self-pay | Admitting: Family Medicine

## 2021-05-25 NOTE — Telephone Encounter (Signed)
Left message for patient to call back and schedule Medicare Annual Wellness Visit (AWV) in office.  ° °If not able to come in office, please offer to do virtually or by telephone.  Left office number and my jabber #[phone]. ° °Due for AWVI ° °Please schedule at anytime with Nurse Health Advisor. °  °

## 2021-06-01 ENCOUNTER — Other Ambulatory Visit (HOSPITAL_BASED_OUTPATIENT_CLINIC_OR_DEPARTMENT_OTHER): Payer: Self-pay | Admitting: Family Medicine

## 2021-06-01 DIAGNOSIS — Z1231 Encounter for screening mammogram for malignant neoplasm of breast: Secondary | ICD-10-CM

## 2021-06-06 ENCOUNTER — Other Ambulatory Visit: Payer: Self-pay | Admitting: Family Medicine

## 2021-06-06 ENCOUNTER — Encounter (HOSPITAL_BASED_OUTPATIENT_CLINIC_OR_DEPARTMENT_OTHER): Payer: Self-pay

## 2021-06-06 ENCOUNTER — Ambulatory Visit (HOSPITAL_BASED_OUTPATIENT_CLINIC_OR_DEPARTMENT_OTHER)
Admission: RE | Admit: 2021-06-06 | Discharge: 2021-06-06 | Disposition: A | Discharge status: Home / Self Care | Payer: Medicare HMO | Source: Ambulatory Visit | Attending: Family Medicine | Admitting: Family Medicine

## 2021-06-06 ENCOUNTER — Other Ambulatory Visit (HOSPITAL_BASED_OUTPATIENT_CLINIC_OR_DEPARTMENT_OTHER): Payer: Self-pay

## 2021-06-06 ENCOUNTER — Ambulatory Visit (INDEPENDENT_AMBULATORY_CARE_PROVIDER_SITE_OTHER): Payer: Medicare HMO

## 2021-06-06 ENCOUNTER — Other Ambulatory Visit: Payer: Self-pay

## 2021-06-06 VITALS — Ht 63.0 in | Wt 190.0 lb

## 2021-06-06 DIAGNOSIS — Z1231 Encounter for screening mammogram for malignant neoplasm of breast: Secondary | ICD-10-CM | POA: Insufficient documentation

## 2021-06-06 DIAGNOSIS — Z0000 Encounter for general adult medical examination without abnormal findings: Secondary | ICD-10-CM

## 2021-06-06 MED ORDER — FLUOXETINE HCL 20 MG PO CAPS
ORAL_CAPSULE | ORAL | 1 refills | Status: DC
  Filled 2021-06-06: qty 135, 90d supply, fill #0
  Filled 2021-11-24: qty 135, 90d supply, fill #1
  Filled 2022-03-07: qty 135, 90d supply, fill #2

## 2021-06-06 NOTE — Patient Instructions (Signed)
[name] , Thank you for taking time to complete your Medicare Wellness Visit. I appreciate your ongoing commitment to your health goals. Please review the following plan we discussed and let me know if I can assist you in the future.   Screening recommendations/referrals: Colonoscopy: Per our conversation, you have Cologuard kit at home. Mammogram: Completed today-Due 06/06/2022 Bone Density: Due-Declined today. Please call the office to schedule. Recommended yearly ophthalmology/optometry visit for glaucoma screening and checkup Recommended yearly dental visit for hygiene and checkup  Vaccinations: Influenza vaccine: Due-May obtain vaccine at our office or your local pharmacy. Pneumococcal vaccine: Up to date Tdap vaccine: Up to date Shingles vaccine: Discuss with pharmacy.   Covid-19:Booster available at the pharmacy.  Advanced directives: Declined information today.  Conditions/risks identified: See problem list  Next appointment: Follow up in one year for your annual wellness visit 06/11/2022 @ 11:00.   Preventive Care 65 Years and Older, Female Preventive care refers to lifestyle choices and visits with your health care provider that can promote health and wellness. What does preventive care include? A yearly physical exam. This is also called an annual well check. Dental exams once or twice a year. Routine eye exams. Ask your health care provider how often you should have your eyes checked. Personal lifestyle choices, including: Daily care of your teeth and gums. Regular physical activity. Eating a healthy diet. Avoiding tobacco and drug use. Limiting alcohol use. Practicing safe sex. Taking low-dose aspirin every day. Taking vitamin and mineral supplements as recommended by your health care provider. What happens during an annual well check? The services and screenings done by your health care provider during your annual well check will depend on your age, overall  health, lifestyle risk factors, and family history of disease. Counseling  Your health care provider may ask you questions about your: Alcohol use. Tobacco use. Drug use. Emotional well-being. Home and relationship well-being. Sexual activity. Eating habits. History of falls. Memory and ability to understand (cognition). Work and work Statistician. Reproductive health. Screening  You may have the following tests or measurements: Height, weight, and BMI. Blood pressure. Lipid and cholesterol levels. These may be checked every 5 years, or more frequently if you are over 50 years old. Skin check. Lung cancer screening. You may have this screening every year starting at age 65 if you have a 30-pack-year history of smoking and currently smoke or have quit within the past 15 years. Fecal occult blood test (FOBT) of the stool. You may have this test every year starting at age 65. Flexible sigmoidoscopy or colonoscopy. You may have a sigmoidoscopy every 5 years or a colonoscopy every 10 years starting at age 78. Hepatitis C blood test. Hepatitis B blood test. Sexually transmitted disease (STD) testing. Diabetes screening. This is done by checking your blood sugar (glucose) after you have not eaten for a while (fasting). You may have this done every 1-3 years. Bone density scan. This is done to screen for osteoporosis. You may have this done starting at age 65. Mammogram. This may be done every 1-2 years. Talk to your health care provider about how often you should have regular mammograms. Talk with your health care provider about your test results, treatment options, and if necessary, the need for more tests. Vaccines  Your health care provider may recommend certain vaccines, such as: Influenza vaccine. This is recommended every year. Tetanus, diphtheria, and acellular pertussis (Tdap, Td) vaccine. You may need a Td booster every 10 years. Zoster vaccine. You  may need this after age  65. Pneumococcal 13-valent conjugate (PCV13) vaccine. One dose is recommended after age 65. Pneumococcal polysaccharide (PPSV23) vaccine. One dose is recommended after age 65. Talk to your health care provider about which screenings and vaccines you need and how often you need them. This information is not intended to replace advice given to you by your health care provider. Make sure you discuss any questions you have with your health care provider. Document Released: 07/29/2015 Document Revised: 03/21/2016 Document Reviewed: 05/03/2015 Elsevier Interactive Patient Education  2017 [place] Prevention in the Home Falls can cause injuries. They can happen to people of all ages. There are many things you can do to make your home safe and to help prevent falls. What can I do on the outside of my home? Regularly fix the edges of walkways and driveways and fix any cracks. Remove anything that might make you trip as you walk through a door, such as a raised step or threshold. Trim any bushes or trees on the path to your home. Use bright outdoor lighting. Clear any walking paths of anything that might make someone trip, such as rocks or tools. Regularly check to see if handrails are loose or broken. Make sure that both sides of any steps have handrails. Any raised decks and porches should have guardrails on the edges. Have any leaves, snow, or ice cleared regularly. Use sand or salt on walking paths during winter. Clean up any spills in your garage right away. This includes oil or grease spills. What can I do in the bathroom? Use night lights. Install grab bars by the toilet and in the tub and shower. Do not use towel bars as grab bars. Use non-skid mats or decals in the tub or shower. If you need to sit down in the shower, use a plastic, non-slip stool. Keep the floor dry. Clean up any water that spills on the floor as soon as it happens. Remove soap buildup in the tub or shower  regularly. Attach bath mats securely with double-sided non-slip rug tape. Do not have throw rugs and other things on the floor that can make you trip. What can I do in the bedroom? Use night lights. Make sure that you have a light by your bed that is easy to reach. Do not use any sheets or blankets that are too big for your bed. They should not hang down onto the floor. Have a firm chair that has side arms. You can use this for support while you get dressed. Do not have throw rugs and other things on the floor that can make you trip. What can I do in the kitchen? Clean up any spills right away. Avoid walking on wet floors. Keep items that you use a lot in easy-to-reach places. If you need to reach something above you, use a strong step stool that has a grab bar. Keep electrical cords out of the way. Do not use floor polish or wax that makes floors slippery. If you must use wax, use non-skid floor wax. Do not have throw rugs and other things on the floor that can make you trip. What can I do with my stairs? Do not leave any items on the stairs. Make sure that there are handrails on both sides of the stairs and use them. Fix handrails that are broken or loose. Make sure that handrails are as long as the stairways. Check any carpeting to make sure that it is firmly  attached to the stairs. Fix any carpet that is loose or worn. Avoid having throw rugs at the top or bottom of the stairs. If you do have throw rugs, attach them to the floor with carpet tape. Make sure that you have a light switch at the top of the stairs and the bottom of the stairs. If you do not have them, ask someone to add them for you. What else can I do to help prevent falls? Wear shoes that: Do not have high heels. Have rubber bottoms. Are comfortable and fit you well. Are closed at the toe. Do not wear sandals. If you use a stepladder: Make sure that it is fully opened. Do not climb a closed stepladder. Make sure that  both sides of the stepladder are locked into place. Ask someone to hold it for you, if possible. Clearly mark and make sure that you can see: Any grab bars or handrails. First and last steps. Where the edge of each step is. Use tools that help you move around (mobility aids) if they are needed. These include: Canes. Walkers. Scooters. Crutches. Turn on the lights when you go into a dark area. Replace any light bulbs as soon as they burn out. Set up your furniture so you have a clear path. Avoid moving your furniture around. If any of your floors are uneven, fix them. If there are any pets around you, be aware of where they are. Review your medicines with your doctor. Some medicines can make you feel dizzy. This can increase your chance of falling. Ask your doctor what other things that you can do to help prevent falls. This information is not intended to replace advice given to you by your health care provider. Make sure you discuss any questions you have with your health care provider. Document Released: 04/28/2009 Document Revised: 12/08/2015 Document Reviewed: 08/06/2014 Elsevier Interactive Patient Education  2017 [employer].

## 2021-06-06 NOTE — Progress Notes (Addendum)
Subjective:   Karen Wall is a 65 y.o. female who presents for an Initial Medicare Annual Wellness Visit.  I connected with Rebel today by telephone and verified that I am speaking with the correct person using two identifiers. Location patient: home Location provider: work Persons participating in the virtual visit: patient, Marine scientist.    I discussed the limitations, risks, security and privacy concerns of performing an evaluation and management service by telephone and the availability of in person appointments. I also discussed with the patient that there may be a patient responsible charge related to this service. The patient expressed understanding and verbally consented to this telephonic visit.    Interactive audio and video telecommunications were attempted between this provider and patient, however failed, due to patient having technical difficulties OR patient did not have access to video capability.  We continued and completed visit with audio only.  Some vital signs may be absent or patient reported.   Time Spent with patient on telephone encounter: 20 minutes   Review of Systems     Cardiac Risk Factors include: dyslipidemia;hypertension;obesity (BMI >30kg/m2);sedentary lifestyle     Objective:    Today's Vitals   06/06/21 1300 06/06/21 1301  Weight: 190 lb (86.2 kg)   Height: _0  (1.6 m)   PainSc:  4    Body mass index is 33.66 kg/m.  Advanced Directives 06/06/2021 02/22/2021 10/27/2020 06/28/2020 03/29/2020 12/28/2019 11/16/2019  Does Patient Have a Medical Advance Directive? _1  No No  Would patient like information on creating a medical advance directive? No - Patient declined No - Patient declined No - Patient declined No - Patient declined No - Patient declined No - Patient declined No - Patient declined  Pre-existing out of facility DNR order (yellow form or pink MOST form) - - - - - - -    Current Medications (verified) Outpatient  Encounter Medications as of 06/06/2021  Medication Sig   acetaminophen (TYLENOL) 500 MG tablet Take 500 mg by mouth every 6 (six) hours as needed for pain.   amLODipine-benazepril (LOTREL) 5-20 MG capsule TAKE 1 CAPSULE BY MOUTH AT BEDTIME.   ARMOUR THYROID 15 MG tablet TAKE 1 TABLET BY MOUTH ONCE DAILY ALONG WITH A 60 MG TABLET TO EQUAL 75 MG DOSE   aspirin 81 MG EC tablet Take 81 mg by mouth daily. Swallow whole.   Cholecalciferol (VITAMIN D3) 1.25 MG (50000 UT) TABS Take 5,000 Units by mouth.   CYANOCOBALAMIN SL Place under the tongue.   FLUoxetine (PROZAC) 20 MG capsule ALTERNATE BETWEEN TAKING 1 CAPSULE BY MOUTH DAILY AND 2 CAPSULES BY MOUTH DAILY   LORazepam (ATIVAN) 1 MG tablet TAKE 1 TABLET (1 MG TOTAL) BY MOUTH AT BEDTIME AS NEEDED FOR ANXIETY OR SLEEP.   lovastatin (MEVACOR) 20 MG tablet TAKE 1 TABLET (20 MG TOTAL) BY MOUTH AT BEDTIME.   thyroid (ARMOUR) 60 MG tablet Take 1 tablet (60 mg total) by mouth daily before breakfast.   hydroxyurea (HYDREA) 500 MG capsule Take 1 capsule (500 mg total) by mouth daily. May take with food to minimize GI side effects. (Patient not taking: Reported on 03/01/2021)   No facility-administered encounter medications on file as of 06/06/2021.    Allergies (verified) Penicillins   History: Past Medical History:  Diagnosis Date   A-fib ([place])    history of   Acquired flat foot    Ankle pain    bilateral   Arthritis    feet ankles, and  knees    Cancer of kidney parenchyma, right (HCC) 09/08/2018   Depression    Diverticulosis    Dyspnea    with exertion    Dysrhythmia    hx of afib with hyperthroid no issues currently    GERD (gastroesophageal reflux disease)    H/O hiatal hernia 08/09/2018   Moderate to Large   Hyperlipidemia    Hypertension    Hypothyroidism    Iron deficiency anemia    iron deficiency last iron infusion 08/2018   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   Numbness    Left side of face around mouth - unknown etiology   Numbness  and tingling    left side - facial   Palpitations    Occurred during hyperthyroid   PONV (postoperative nausea and vomiting)    Right renal mass 07/2018   2.1 cm solid heterogeneously enhancing mass in posterior midpole    Sinus tachycardia by electrocardiogram 05/12/2018   Thrombocythemia    Thrombocytosis 11/22/2017   Thyroid cancer ([place])    Thyroid disease    hyperthyroidism   Vitamin B 12 deficiency    Vitamin D deficiency    Past Surgical History:  Procedure Laterality Date   BREAST BIOPSY     age 45 - negative   CHOLECYSTECTOMY  1992   OPERATIVE ULTRASOUND Right 09/25/2018   Procedure: OPERATIVE ULTRASOUND;  Surgeon: Raynelle Bring, MD;  Location: [facility];  Service: Urology;  Laterality: Right;   ROBOTIC ASSITED PARTIAL NEPHRECTOMY Right 09/25/2018   Procedure: XI ROBOTIC RIGHT  ASSITED PARTIAL NEPHRECTOMY;  Surgeon: Raynelle Bring, MD;  Location: [facility];  Service: Urology;  Laterality: Right;   THYROIDECTOMY N/A 07/03/2013   Procedure: TOTAL THYROIDECTOMY;  Surgeon: Earnstine Regal, MD;  Location: [facility];  Service: General;  Laterality: N/A;   Family History  Problem Relation Age of Onset   Hypertension Mother    Diabetes Mother    Hyperlipidemia Mother    COPD Mother    CAD Father        MI in his 12s   Diabetes Father    Hyperlipidemia Father    Hypertension Father    Mental illness Father    Mental illness Maternal Grandmother    Stroke Maternal Grandmother    Diabetes Maternal Grandmother    Stroke Maternal Grandfather    Heart attack Maternal Grandfather    Congestive Heart Failure Paternal Grandmother    Heart attack Paternal Grandfather    Social History   Socioeconomic History   Marital status: Divorced    Spouse name: Not on file   Number of children: 1   Years of education: college   Highest education level: Associate degree: occupational, Hotel manager, or vocational program  Occupational History   Occupation: Therapist, sports  Tobacco Use   Smoking status: Never    Smokeless tobacco: Never  Vaping Use   Vaping Use: Never used  Substance and Sexual Activity   Alcohol use: No    Alcohol/week: 0.0 standard drinks   Drug use: No   Sexual activity: Never  Other Topics Concern   Not on file  Social History Narrative   Lives at home alone.   Right-handed.   2 cups caffeine daily.   Social Determinants of Health   Financial Resource Strain: Low Risk    Difficulty of Paying Living Expenses: Not hard at all  Food Insecurity: No Food Insecurity   Worried About Charity fundraiser in the Last Year: Never true   Ran Out  of Food in the Last Year: Never true  Transportation Needs: No Transportation Needs   Lack of Transportation (Medical): No   Lack of Transportation (Non-Medical): No  Physical Activity: Insufficiently Active   Days of Exercise per Week: 4 days   Minutes of Exercise per Session: 30 min  Stress: No Stress Concern Present   Feeling of Stress : Only a little  Social Connections: Socially Isolated   Frequency of Communication with Friends and Family: Once a week   Frequency of Social Gatherings with Friends and Family: Once a week   Attends Religious Services: Never   Marine scientist or Organizations: No   Attends Music therapist: Never   Marital Status: Divorced    Tobacco Counseling Counseling given: Not Answered   Clinical Intake:  Pre-visit preparation completed: Yes  Pain : 0-10 Pain Score: 4  Pain Type: Chronic pain Pain Location: Foot Pain Orientation: Left, Right Pain Onset: More than a month ago Pain Frequency: Constant     BMI - recorded: 33.66 Nutritional Status: BMI > 30  Obese Nutritional Risks: None Diabetes: No  How often do you need to have someone help you when you read instructions, pamphlets, or other written materials from your doctor or pharmacy?: 1 - Never  Diabetic?No  Interpreter Needed?: No  Information entered by :: Caroleen Hamman LPN   Activities of Daily  Living In your present state of health, do you have any difficulty performing the following activities: 06/06/2021  Hearing? N  Vision? N  Difficulty concentrating or making decisions? N  Walking or climbing stairs? N  Dressing or bathing? N  Doing errands, shopping? N  Preparing Food and eating ? N  Using the Toilet? N  In the past six months, have you accidently leaked urine? N  Do you have problems with loss of bowel control? N  Managing your Medications? N  Managing your Finances? N  Housekeeping or managing your Housekeeping? N  Some recent data might be hidden    Patient Care Team: Copland, Gay Filler, MD as PCP - General (Family Medicine)  Indicate any recent Medical Services you may have received from other than [facility] providers in the past year (date may be approximate).     Assessment:   This is a routine wellness examination for Shaune.  Hearing/Vision screen Hearing Screening - Comments:: No issues Vision Screening - Comments:: Last eye exam-within the past year  Dietary issues and exercise activities discussed: Current Exercise Habits: Home exercise routine, Type of exercise: yoga;stretching, Time (Minutes): 25, Frequency (Times/Week): 4, Weekly Exercise (Minutes/Week): 100, Intensity: Mild, Exercise limited by: orthopedic condition(s)   Goals Addressed             This Visit's Progress    Patient Stated       Would like to lose some weight       Depression Screen PHQ 2/9 Scores 06/06/2021 03/03/2021 02/29/2020 05/18/2015  PHQ - 2 Score _0 0  PHQ- 9 Score - - 17 -    Fall Risk Fall Risk  06/06/2021 03/03/2021 07/20/2016 05/18/2015  Falls in the past year? 1 0 No No  Number falls in past yr: 1 - - -  Injury with Fall? 0 - - -  Risk for fall due to : History of fall(s) - - -  Follow up Falls prevention discussed - - -    FALL RISK PREVENTION PERTAINING TO THE HOME:  Any stairs in or around the home? Yes  If so, are there any without handrails? No   Home free of loose throw rugs in walkways, pet beds, electrical cords, etc? Yes  Adequate lighting in your home to reduce risk of falls? Yes   ASSISTIVE DEVICES UTILIZED TO PREVENT FALLS:  Life alert? No  Use of a cane, walker or w/c? No  Grab bars in the bathroom? No  Shower chair or bench in shower? Yes  Elevated toilet seat or a handicapped toilet? No   TIMED UP AND GO:  Was the test performed? No . Phone visit   Cognitive Function:Normal cognitive status assessed by this Nurse Health Advisor. No abnormalities found.          Immunizations Immunization History  Administered Date(s) Administered   Influenza,inj,Quad PF,6+ Mos 04/19/2014, 04/15/2016, 05/12/2018, 04/16/2019   Influenza-Unspecified 04/15/2013, 03/17/2015, 04/24/2017, 05/05/2020   PFIZER(Purple Top)SARS-COV-2 Vaccination 09/23/2019, 10/13/2019, 05/05/2020   PNEUMOCOCCAL CONJUGATE-20 03/01/2021   Tdap 09/01/2018    TDAP status: Up to date  Flu Vaccine status: Due, Education has been provided regarding the importance of this vaccine. Advised may receive this vaccine at local pharmacy or Health Dept. Aware to provide a copy of the vaccination record if obtained from local pharmacy or Health Dept. Verbalized acceptance and understanding.  Pneumococcal vaccine status: Up to date  Covid-19 vaccine status: Information provided on how to obtain vaccines.   Qualifies for Shingles Vaccine? Yes   Zostavax completed No   Shingrix Completed?: No.    Education has been provided regarding the importance of this vaccine. Patient has been advised to call insurance company to determine out of pocket expense if they have not yet received this vaccine. Advised may also receive vaccine at local pharmacy or Health Dept. Verbalized acceptance and understanding.  Screening Tests Health Maintenance  Topic Date Due   Zoster Vaccines- Shingrix (1 of 2) Never done   COVID-19 Vaccine (4 - Booster for Pfizer series) 06/30/2020    Fecal DNA (Cologuard)  07/18/2020   INFLUENZA VACCINE  02/13/2021   MAMMOGRAM  02/15/2022   PAP SMEAR-Modifier  09/01/2023   TETANUS/TDAP  09/01/2028   Pneumonia Vaccine 58+ Years old  Completed   DEXA SCAN  Completed   Hepatitis C Screening  Completed   HIV Screening  Completed   HPV VACCINES  Aged Out    Health Maintenance  Health Maintenance Due  Topic Date Due   Zoster Vaccines- Shingrix (1 of 2) Never done   COVID-19 Vaccine (4 - Booster for Pfizer series) 06/30/2020   Fecal DNA (Cologuard)  07/18/2020   INFLUENZA VACCINE  02/13/2021    Colorectal cancer screening: Patient states she has Colouard kit at home.  Mammogram status:Scheduled for today  Bone Density status: Declined today  Lung Cancer Screening: (Low Dose CT Chest recommended if Age 49-80 years, 30 pack-year currently smoking OR have quit w/in 15years.) does not qualify.     Additional Screening:  Hepatitis C Screening: Completed 05/18/2015  Vision Screening: Recommended annual ophthalmology exams for early detection of glaucoma and other disorders of the eye. Is the patient up to date with their annual eye exam?  Yes  Who is the provider or what is the name of the office in which the patient attends annual eye exams? Unsure of name   Dental Screening: Recommended annual dental exams for proper oral hygiene  Community Resource Referral / Chronic Care Management: CRR required this visit?  No   CCM required this visit?  No      Plan:  I have personally reviewed and noted the following in the patient's chart:   Medical and social history Use of alcohol, tobacco or illicit drugs  Current medications and supplements including opioid prescriptions. Patient is not currently taking opioid prescriptions. Functional ability and status Nutritional status Physical activity Advanced directives List of other physicians Hospitalizations, surgeries, and ER visits in previous 12  months Vitals Screenings to include cognitive, depression, and falls Referrals and appointments  In addition, I have reviewed and discussed with patient certain preventive protocols, quality metrics, and best practice recommendations. A written personalized care plan for preventive services as well as general preventive health recommendations were provided to patient.   Due to this being a telephonic visit, the after visit summary with patients personalized plan was offered to patient via mail or my-chart.  Patient would like to access on my-chart.   Marta Antu, LPN   09/81/1914  Nurse Health Advisor  Nurse Notes: None   Medical screening examination/treatment/procedure(s) were performed by non-physician practitioner and as supervising provider I was immediately available for consultation/collaboration.  I agree with above. Marrian Salvage, FNP

## 2021-07-05 ENCOUNTER — Other Ambulatory Visit (HOSPITAL_BASED_OUTPATIENT_CLINIC_OR_DEPARTMENT_OTHER): Payer: Self-pay

## 2021-07-25 ENCOUNTER — Other Ambulatory Visit: Payer: Self-pay

## 2021-07-25 ENCOUNTER — Inpatient Hospital Stay: Payer: Medicare HMO | Admitting: Hematology & Oncology

## 2021-07-25 ENCOUNTER — Inpatient Hospital Stay: Payer: Medicare HMO | Attending: Hematology & Oncology

## 2021-07-25 ENCOUNTER — Telehealth: Payer: Self-pay | Admitting: *Deleted

## 2021-07-25 ENCOUNTER — Encounter: Payer: Self-pay | Admitting: Hematology & Oncology

## 2021-07-25 VITALS — BP 108/74 | HR 71 | Temp 98.2°F | Resp 18 | Wt 206.5 lb

## 2021-07-25 DIAGNOSIS — K909 Intestinal malabsorption, unspecified: Secondary | ICD-10-CM

## 2021-07-25 DIAGNOSIS — Z880 Allergy status to penicillin: Secondary | ICD-10-CM | POA: Insufficient documentation

## 2021-07-25 DIAGNOSIS — Z79899 Other long term (current) drug therapy: Secondary | ICD-10-CM | POA: Insufficient documentation

## 2021-07-25 DIAGNOSIS — C641 Malignant neoplasm of right kidney, except renal pelvis: Secondary | ICD-10-CM

## 2021-07-25 DIAGNOSIS — D509 Iron deficiency anemia, unspecified: Secondary | ICD-10-CM | POA: Insufficient documentation

## 2021-07-25 DIAGNOSIS — D75839 Thrombocytosis, unspecified: Secondary | ICD-10-CM | POA: Insufficient documentation

## 2021-07-25 LAB — CBC WITH DIFFERENTIAL (CANCER CENTER ONLY)
Abs Immature Granulocytes: 0.03 10*3/uL (ref 0.00–0.07)
Basophils Absolute: 0 10*3/uL (ref 0.0–0.1)
Basophils Relative: 1 %
Eosinophils Absolute: 0.1 10*3/uL (ref 0.0–0.5)
Eosinophils Relative: 1 %
HCT: 44.1 % (ref 36.0–46.0)
Hemoglobin: 14.3 g/dL (ref 12.0–15.0)
Immature Granulocytes: 0 %
Lymphocytes Relative: 31 %
Lymphs Abs: 2.4 10*3/uL (ref 0.7–4.0)
MCH: 28.6 pg (ref 26.0–34.0)
MCHC: 32.4 g/dL (ref 30.0–36.0)
MCV: 88.2 fL (ref 80.0–100.0)
Monocytes Absolute: 0.7 10*3/uL (ref 0.1–1.0)
Monocytes Relative: 10 %
Neutro Abs: 4.4 10*3/uL (ref 1.7–7.7)
Neutrophils Relative %: 57 %
Platelet Count: 482 10*3/uL — ABNORMAL HIGH (ref 150–400)
RBC: 5 MIL/uL (ref 3.87–5.11)
RDW: 13.1 % (ref 11.5–15.5)
WBC Count: 7.7 10*3/uL (ref 4.0–10.5)
nRBC: 0 % (ref 0.0–0.2)

## 2021-07-25 LAB — CMP (CANCER CENTER ONLY)
ALT: 13 U/L (ref 0–44)
AST: 15 U/L (ref 15–41)
Albumin: 4.2 g/dL (ref 3.5–5.0)
Alkaline Phosphatase: 63 U/L (ref 38–126)
Anion gap: 6 (ref 5–15)
BUN: 13 mg/dL (ref 8–23)
CO2: 31 mmol/L (ref 22–32)
Calcium: 9.6 mg/dL (ref 8.9–10.3)
Chloride: 99 mmol/L (ref 98–111)
Creatinine: 0.73 mg/dL (ref 0.44–1.00)
GFR, Estimated: >60 mL/min (ref >60)
Glucose, Bld: 87 mg/dL (ref 70–99)
Potassium: 4.5 mmol/L (ref 3.5–5.1)
Sodium: 136 mmol/L (ref 135–145)
Total Bilirubin: 0.4 mg/dL (ref 0.3–1.2)
Total Protein: 7.3 g/dL (ref 6.5–8.1)

## 2021-07-25 LAB — IRON AND IRON BINDING CAPACITY (CC-WL,HP ONLY)
Iron: 96 ug/dL (ref 28–170)
Saturation Ratios: 27 % (ref 10.4–31.8)
TIBC: 354 ug/dL (ref 250–450)
UIBC: 258 ug/dL (ref 148–442)

## 2021-07-25 LAB — LACTATE DEHYDROGENASE: LDH: 150 U/L (ref 98–192)

## 2021-07-25 NOTE — Telephone Encounter (Signed)
Per 07/25/21 los - gave upcoming appointments - confirmed

## 2021-07-25 NOTE — Progress Notes (Signed)
Hematology and Oncology Follow Up Visit   Karen Wall [id number] Oct 27, 1955 66 y.o. 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  We last saw her back in August.  Since then, she has been doing pretty well.  She is trying to renovate her mother's farm house which is 66 years old.  This clearly will take quite a while.  She has had no problems with respect to her bowels or bladder.  She has had no cough or shortness of breath..  She had a mammogram recently which was unremarkable.  Her iron studies back in August showed a ferritin of 177 with an iron saturation of 31%.  She has had no rashes.  There is been no leg swelling.  She still has a lot of feet problems and probably will need surgery at some point.  She has had no headache.  Thankfully, she has had no COVID issues.  Overall, I would say performance status is probably ECOG 1.      Medications:  Current Outpatient Medications:    acetaminophen (TYLENOL) 500 MG tablet, Take 500 mg by mouth every 6 (six) hours as needed for pain., Disp: , Rfl:    amLODipine-benazepril (LOTREL) 5-20 MG capsule, Take 1 capsule by mouth at bedtime., Disp: 90 capsule, Rfl: 1   LORazepam (ATIVAN) 1 MG tablet, Take 1 tablet (1 mg total) by mouth at bedtime as needed for anxiety or sleep., Disp: 30 tablet, Rfl: 0   lovastatin (MEVACOR) 20 MG tablet, Take 1 tablet (20 mg total) by mouth at bedtime., Disp: 90 tablet, Rfl: 3   thyroid (ARMOUR THYROID) 15 MG tablet, Take 1 tablet (15 mg total) by mouth daily. Along with the 60 mg tablet. (Patient taking  differently: Take 15 mg by mouth daily before breakfast. Along with the 60 mg tablet.), Disp: 90 tablet, Rfl: 3   thyroid (ARMOUR THYROID) 60 MG tablet, Take every day along with the 15 mg tablet. (Patient taking differently: Take 60 mg by mouth daily before breakfast. Take every day along with the 15 mg tablet.), Disp: 90 tablet, Rfl: 3   traMADol (ULTRAM) 50 MG tablet, Take 1-2 tablets (50-100 mg total) by mouth every 6 (six) hours as needed for moderate pain or severe pain., Disp: 20 tablet, Rfl: 0   Allergies:       Allergies  Allergen Reactions   Penicillins Shortness Of Breath      Did it involve swelling of the face/tongue/throat, SOB, or low BP? Yes Did it involve sudden or severe rash/hives, skin peeling, or any reaction on the inside of your mouth or nose? No Did you need to seek medical attention at a hospital or doctor's office? Unknown When did it last happen?  36-75 years old     If all above answers are NO, may proceed with cephalosporin use.  Past Medical History, Surgical history, Social history, and Family History were reviewed and updated.   Review of Systems: Review of Systems  Constitutional: Negative.   HENT:  Negative.   Eyes: Negative.   Respiratory: Negative.   Cardiovascular: Negative.   Gastrointestinal: Negative.   Endocrine: Negative.   Genitourinary: Negative.    Musculoskeletal: Negative.   Skin: Negative.   Neurological: Negative.   Hematological: Negative.   Psychiatric/Behavioral: Negative.       Physical Exam: Vital signs this visit show temperature of 97.1.  Pulse is 78.  Blood pressure 100/66.  Weight is 209 pounds.      Wt Readings from Last 3 Encounters:  09/25/18 213 lb (96.6 kg)  09/23/18 213 lb (96.6 kg)  09/08/18 198 lb (89.8 kg)      Physical Exam Vitals signs reviewed.  HENT:     Head: Normocephalic and atraumatic.  Eyes:     Pupils: Pupils are equal, round, and reactive to light.  Neck:     Musculoskeletal:  Normal range of motion.  Cardiovascular:     Rate and Rhythm: Normal rate and regular rhythm.     Heart sounds: Normal heart sounds.  Pulmonary:     Effort: Pulmonary effort is normal.     Breath sounds: Normal breath sounds.  Abdominal:     General: Bowel sounds are normal.     Palpations: Abdomen is soft.  Musculoskeletal: Normal range of motion.        General: No tenderness or deformity.  Lymphadenopathy:     Cervical: No cervical adenopathy.  Skin:    General: Skin is warm and dry.     Findings: No erythema or rash.  Neurological:     Mental Status: She is alert and oriented to person, place, and time.  Psychiatric:        Behavior: Behavior normal.        Thought Content: Thought content normal.        Judgment: Judgment normal.          Recent Labs       Lab Results  Component Value Date    WBC 7.3 09/23/2018    HGB 12.0 09/26/2018    HCT 39.4 09/26/2018    MCV 87.6 09/23/2018    PLT 479 (H) 09/23/2018        Chemistry    Labs (Brief)          Component Value Date/Time    NA 137 09/26/2018 0339    K 4.0 09/26/2018 0339    CL 105 09/26/2018 0339    CO2 24 09/26/2018 0339    BUN 9 09/26/2018 0339    CREATININE 0.70 09/26/2018 0339    CREATININE 0.81 09/08/2018 1148    CREATININE 0.74 05/18/2015 1356      Labs (Brief)          Component Value Date/Time    CALCIUM 8.6 (L) 09/26/2018 0339    ALKPHOS 52 09/08/2018 1148    AST 13 (L) 09/08/2018 1148    ALT 13 09/08/2018 1148    BILITOT 0.2 (L) 09/08/2018 1148           Impression and Plan: [name] is a 66 year old white female.    She has thrombocythemia.  Everything is holding pretty steady right now.  Her platelet count is up a little bit but yet we will continue to watch this.  We will have to see what her iron studies look like.  She was  back to see urology in March and has a CT scan done by urology.  I will plan to see her back probably in the springtime.    [name],  MD 6/11/202012:28 PM

## 2021-07-26 LAB — FERRITIN: Ferritin: 319 ng/mL — ABNORMAL HIGH (ref 11–307)

## 2021-08-23 ENCOUNTER — Encounter: Payer: Self-pay | Admitting: Family Medicine

## 2021-08-28 ENCOUNTER — Other Ambulatory Visit: Payer: Self-pay | Admitting: Urology

## 2021-08-28 DIAGNOSIS — Z85528 Personal history of other malignant neoplasm of kidney: Secondary | ICD-10-CM

## 2021-08-30 ENCOUNTER — Other Ambulatory Visit (HOSPITAL_BASED_OUTPATIENT_CLINIC_OR_DEPARTMENT_OTHER): Payer: Self-pay | Admitting: Urology

## 2021-08-30 DIAGNOSIS — Z85528 Personal history of other malignant neoplasm of kidney: Secondary | ICD-10-CM

## 2021-09-04 ENCOUNTER — Encounter: Payer: Medicare HMO | Admitting: Family Medicine

## 2021-09-04 NOTE — Progress Notes (Signed)
[facility] [address] [place], [place] [address] [phone]  Date:  09/07/2021   Name:  Karen Wall   DOB:  1955/10/28   MRN:  [id number]  PCP:  Darreld Mclean, MD    Chief Complaint: Annual Exam (Concerns/ questions: pt says she is still having a lot of skipped heart beats /Flu shot today: declines this year/Zoster: none yet/Cologard due: none recent)   History of Present Illness:  Karen Wall is a 66 y.o. very pleasant female patient who presents with the following:  Patient seen today for 68-month follow-up/physical exam Most recent visit with myself was in August  At that time her dog was not doing well, which was hard for her.  He did pass away History of thyroid cancer s/p thyroidectomy 2014, HTN, renal cancer s/p partial nephrectomy 09/2018, iron def, essential thrombocytosis.  She also has chronic foot problems which limit her mobility  Last visit with urology was 3/22- she is seeing them next month- Dr Alinda Money  Seen by hematology, Dr. 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  We talked about this some today.  Discussed possibly using a GLP-1.  For  the time being she prefers to continue weight loss efforts on her own.  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   Thrombocytosis 11/22/2017   Bilateral ankle pain 10/15/2017   IDA (iron deficiency anemia) 07/23/2016   Vitamin D deficiency 03/15/2016   Low vitamin B12 level 03/15/2016   Dyspnea 05/28/2014   Chest tightness 05/28/2014   Thyroid cancer, 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   Postsurgical hypothyroidism 07/24/2013    Past Medical History:  Diagnosis Date   A-fib [facility])    history of   Acquired flat foot    Ankle pain    bilateral   Arthritis    feet ankles, and knees    Cancer of kidney parenchyma, right ([place]) 09/08/2018   Depression    Diverticulosis  Dyspnea    with exertion    Dysrhythmia    hx of afib with hyperthroid no issues currently    GERD (gastroesophageal reflux disease)    H/O hiatal hernia 08/09/2018   Moderate to Large   Hyperlipidemia    Hypertension    Hypothyroidism    Iron deficiency anemia    iron deficiency last iron infusion 08/2018   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   Numbness    Left side of face around mouth - unknown etiology   Numbness and tingling    left side - facial   Palpitations    Occurred during hyperthyroid   PONV (postoperative nausea and vomiting)    Right renal mass 07/2018   2.1 cm solid heterogeneously enhancing mass in posterior midpole    Sinus tachycardia by  electrocardiogram 05/12/2018   Thrombocythemia    Thrombocytosis 11/22/2017   Thyroid cancer ([place])    Thyroid disease    hyperthyroidism   Vitamin B 12 deficiency    Vitamin D deficiency     Past Surgical History:  Procedure Laterality Date   BREAST BIOPSY     age 47 - negative   CHOLECYSTECTOMY  1992   OPERATIVE ULTRASOUND Right 09/25/2018   Procedure: OPERATIVE ULTRASOUND;  Surgeon: Raynelle Bring, MD;  Location: [facility];  Service: Urology;  Laterality: Right;   ROBOTIC ASSITED PARTIAL NEPHRECTOMY Right 09/25/2018   Procedure: XI ROBOTIC RIGHT  ASSITED PARTIAL NEPHRECTOMY;  Surgeon: Raynelle Bring, MD;  Location: [facility];  Service: Urology;  Laterality: Right;   THYROIDECTOMY N/A 07/03/2013   Procedure: TOTAL THYROIDECTOMY;  Surgeon: Earnstine Regal, MD;  Location: [facility];  Service: General;  Laterality: N/A;    Social History   Tobacco Use   Smoking status: Never   Smokeless tobacco: Never  Vaping Use   Vaping Use: Never used  Substance Use Topics   Alcohol use: No    Alcohol/week: 0.0 standard drinks   Drug use: No    Family History  Problem Relation Age of Onset   Hypertension Mother    Diabetes Mother    Hyperlipidemia Mother    COPD Mother    CAD Father        MI in his 40s   Diabetes Father    Hyperlipidemia Father    Hypertension Father    Mental illness Father    Mental illness Maternal Grandmother    Stroke Maternal Grandmother    Diabetes Maternal Grandmother    Stroke Maternal Grandfather    Heart attack Maternal Grandfather    Congestive Heart Failure Paternal Grandmother    Heart attack Paternal Grandfather     Allergies  Allergen Reactions   Penicillins Shortness Of Breath    Did it involve swelling of the face/tongue/throat, SOB, or low BP? Yes Did it involve sudden or severe rash/hives, skin peeling, or any reaction on the inside of your mouth or nose? No Did you need to seek medical attention at a hospital or doctor's office? Unknown When did  it last happen?  73-32 years old     If all above answers are "NO", may proceed with cephalosporin use.     Medication list has been reviewed and updated.  Current Outpatient Medications on File Prior to Visit  Medication Sig Dispense Refill   acetaminophen (TYLENOL) 500 MG tablet Take 500 mg by mouth every 6 (six) hours as needed for pain.     ARMOUR THYROID 15 MG tablet TAKE 1 TABLET BY MOUTH ONCE DAILY  ALONG WITH A 60 MG TABLET TO EQUAL 75 MG DOSE 90 tablet 3   aspirin 81 MG EC tablet Take 81 mg by mouth daily. Swallow whole.     Cholecalciferol (VITAMIN D3) 1.25 MG (50000 UT) TABS Take 5,000 Units by mouth.     CYANOCOBALAMIN SL Place under the tongue.     FLUoxetine (PROZAC) 20 MG capsule ALTERNATE BETWEEN TAKING 1 CAPSULE BY MOUTH DAILY AND 2 CAPSULES BY MOUTH DAILY 180 capsule 1   LORazepam (ATIVAN) 1 MG tablet TAKE 1 TABLET (1 MG TOTAL) BY MOUTH AT BEDTIME AS NEEDED FOR ANXIETY OR SLEEP. 30 tablet 2   thyroid (ARMOUR) 60 MG tablet Take 1 tablet (60 mg total) by mouth daily before breakfast. 90 tablet 1   amLODipine-benazepril (LOTREL) 5-20 MG capsule TAKE 1 CAPSULE BY MOUTH AT BEDTIME. 90 capsule 3   hydroxyurea (HYDREA) 500 MG capsule Take 1 capsule (500 mg total) by mouth daily. May take with food to minimize GI side effects. (Patient not taking: Reported on 09/07/2021) 30 capsule 6   lovastatin (MEVACOR) 20 MG tablet TAKE 1 TABLET (20 MG TOTAL) BY MOUTH AT BEDTIME. 90 tablet 3   No current facility-administered medications on file prior to visit.    Review of Systems:  As per HPI- otherwise negative.   Physical Examination: Vitals:   09/07/21 1044  BP: 124/78  Pulse: 86  Resp: 18  Temp: 98 F (36.7 C)  SpO2: 95%   Vitals:   09/07/21 1044  Weight: 209 lb 6.4 oz (95 kg)  Height: 5\' 3"  (1.6 m)   Body mass index is 37.09 kg/m. Ideal Body Weight: Weight in (lb) to have BMI = 25: 140.8  GEN: no acute distress.  Obese, looks well HEENT: Atraumatic, Normocephalic.   Bilateral TM wnl, oropharynx normal.  PEERL,EOMI.   Ears and Nose: No external deformity. CV: RRR with several skipped or early beats noted during exam, No M/G/R. No JVD. No thrill. No extra heart sounds. PULM: CTA B, no wheezes, crackles, rhonchi. No retractions. No resp. distress. No accessory muscle use. ABD: S, NT, ND, +BS. No rebound. No HSM. EXTR: No c/c/e PSYCH: Normally interactive. Conversant.   EKG: Normal sinus rhythm, no abnormal beats.  Compared with EKG from 2019 no significant changes noted Assessment and Plan: Screening for colon cancer - Plan: Cologuard  Pulse irregularity - Plan: EKG 12-Lead, TSH, CANCELED: LONG TERM MONITOR (3-14 DAYS)  Fatigue, unspecified type - Plan: TSH, VITAMIN D 25 Hydroxy (Vit-D Deficiency, Fractures)  Postsurgical hypothyroidism - Plan: TSH  Screening for diabetes mellitus - Plan: Hemoglobin Q9U, Basic metabolic panel  Hyperlipidemia, unspecified hyperlipidemia type - Plan: Lipid panel  Patient seen today for follow-up. Ordered Cologuard Monitor thyroid Will plan further follow- up pending labs. Discussed pulse irregularity she is noted.  EKG is normal.  Suspect she is likely having benign PACs or PVCs.  She would be interested in further evaluation, will order a heart monitor  Signed Lamar Blinks, MD  Received her labs as below, message to patient  Results for orders placed or performed in visit on 09/07/21  Hemoglobin A1c  Result Value Ref Range   Hgb A1c MFr Bld 5.5 4.6 - 6.5 %  Lipid panel  Result Value Ref Range   Cholesterol 188 0 - 200 mg/dL   Triglycerides 80.0 0.0 - 149.0 mg/dL   HDL 60.80 >39.00 mg/dL   VLDL 16.0 0.0 - 40.0 mg/dL   LDL Cholesterol 111 (H) 0 - 99 mg/dL  Total CHOL/HDL Ratio 3    NonHDL 126.93   TSH  Result Value Ref Range   TSH 0.31 (L) 0.35 - 5.50 uIU/mL  VITAMIN D 25 Hydroxy (Vit-D Deficiency, Fractures)  Result Value Ref Range   VITD 55.86 30.00 - 100.00 ng/mL  Basic metabolic panel   Result Value Ref Range   Sodium 138 135 - 145 mEq/L   Potassium 4.9 3.5 - 5.1 mEq/L   Chloride 101 96 - 112 mEq/L   CO2 30 19 - 32 mEq/L   Glucose, Bld 83 70 - 99 mg/dL   BUN 13 6 - 23 mg/dL   Creatinine, Ser 0.69 0.40 - 1.20 mg/dL   GFR 91.07 >60.00 mL/min   Calcium 9.1 8.4 - 10.5 mg/dL

## 2021-09-04 NOTE — Patient Instructions (Addendum)
Good to see you again today!  Assuming all is well please see me in about 6 months  We will get you set up for a cardiac monitor for your irregular pulse- let me now if you don't hear about this soon  Please consider getting the shingles vaccine at your convenience- Shingrix

## 2021-09-07 ENCOUNTER — Ambulatory Visit (INDEPENDENT_AMBULATORY_CARE_PROVIDER_SITE_OTHER): Payer: Medicare HMO

## 2021-09-07 ENCOUNTER — Other Ambulatory Visit (HOSPITAL_BASED_OUTPATIENT_CLINIC_OR_DEPARTMENT_OTHER): Payer: Self-pay

## 2021-09-07 ENCOUNTER — Encounter: Payer: Self-pay | Admitting: Family Medicine

## 2021-09-07 ENCOUNTER — Other Ambulatory Visit: Payer: Self-pay | Admitting: Family Medicine

## 2021-09-07 ENCOUNTER — Ambulatory Visit (INDEPENDENT_AMBULATORY_CARE_PROVIDER_SITE_OTHER): Payer: Medicare HMO | Admitting: Family Medicine

## 2021-09-07 VITALS — BP 124/78 | HR 86 | Temp 98.0°F | Resp 18 | Ht 63.0 in | Wt 209.4 lb

## 2021-09-07 DIAGNOSIS — R5383 Other fatigue: Secondary | ICD-10-CM

## 2021-09-07 DIAGNOSIS — I499 Cardiac arrhythmia, unspecified: Secondary | ICD-10-CM

## 2021-09-07 DIAGNOSIS — R0989 Other specified symptoms and signs involving the circulatory and respiratory systems: Secondary | ICD-10-CM

## 2021-09-07 DIAGNOSIS — E890 Postprocedural hypothyroidism: Secondary | ICD-10-CM

## 2021-09-07 DIAGNOSIS — Z1211 Encounter for screening for malignant neoplasm of colon: Secondary | ICD-10-CM | POA: Diagnosis not present

## 2021-09-07 DIAGNOSIS — E785 Hyperlipidemia, unspecified: Secondary | ICD-10-CM | POA: Diagnosis not present

## 2021-09-07 DIAGNOSIS — E559 Vitamin D deficiency, unspecified: Secondary | ICD-10-CM

## 2021-09-07 DIAGNOSIS — Z8679 Personal history of other diseases of the circulatory system: Secondary | ICD-10-CM

## 2021-09-07 DIAGNOSIS — Z131 Encounter for screening for diabetes mellitus: Secondary | ICD-10-CM | POA: Diagnosis not present

## 2021-09-07 LAB — BASIC METABOLIC PANEL
BUN: 13 mg/dL (ref 6–23)
CO2: 30 mEq/L (ref 19–32)
Calcium: 9.1 mg/dL (ref 8.4–10.5)
Chloride: 101 mEq/L (ref 96–112)
Creatinine, Ser: 0.69 mg/dL (ref 0.40–1.20)
GFR: 91.07 mL/min (ref >60.00)
Glucose, Bld: 83 mg/dL (ref 70–99)
Potassium: 4.9 mEq/L (ref 3.5–5.1)
Sodium: 138 mEq/L (ref 135–145)

## 2021-09-07 LAB — LIPID PANEL
Cholesterol: 188 mg/dL (ref 0–200)
HDL: 60.8 mg/dL (ref >39.00)
LDL Cholesterol: 111 mg/dL — ABNORMAL HIGH (ref 0–99)
NonHDL: 126.93
Total CHOL/HDL Ratio: 3
Triglycerides: 80 mg/dL (ref 0.0–149.0)
VLDL: 16 mg/dL (ref 0.0–40.0)

## 2021-09-07 LAB — HEMOGLOBIN A1C: Hgb A1c MFr Bld: 5.5 % (ref 4.6–6.5)

## 2021-09-07 LAB — VITAMIN D 25 HYDROXY (VIT D DEFICIENCY, FRACTURES): VITD: 55.86 ng/mL (ref 30.00–100.00)

## 2021-09-07 LAB — TSH: TSH: 0.31 u[IU]/mL — ABNORMAL LOW (ref 0.35–5.50)

## 2021-09-07 NOTE — Progress Notes (Unsigned)
Enrolled for [employer] to mail a ZIO XT long term holter monitor to the patients address on file.  

## 2021-09-09 ENCOUNTER — Encounter (HOSPITAL_BASED_OUTPATIENT_CLINIC_OR_DEPARTMENT_OTHER): Payer: Self-pay

## 2021-09-09 ENCOUNTER — Ambulatory Visit (HOSPITAL_BASED_OUTPATIENT_CLINIC_OR_DEPARTMENT_OTHER)
Admission: RE | Admit: 2021-09-09 | Discharge: 2021-09-09 | Disposition: A | Discharge status: Home / Self Care | Payer: Medicare HMO | Source: Ambulatory Visit | Attending: Urology | Admitting: Urology

## 2021-09-09 ENCOUNTER — Other Ambulatory Visit: Payer: Self-pay

## 2021-09-09 DIAGNOSIS — Z85528 Personal history of other malignant neoplasm of kidney: Secondary | ICD-10-CM

## 2021-09-09 DIAGNOSIS — K449 Diaphragmatic hernia without obstruction or gangrene: Secondary | ICD-10-CM | POA: Diagnosis not present

## 2021-09-09 DIAGNOSIS — K760 Fatty (change of) liver, not elsewhere classified: Secondary | ICD-10-CM | POA: Diagnosis not present

## 2021-09-09 DIAGNOSIS — N2889 Other specified disorders of kidney and ureter: Secondary | ICD-10-CM | POA: Diagnosis not present

## 2021-09-09 DIAGNOSIS — I700 Atherosclerosis of aorta: Secondary | ICD-10-CM | POA: Diagnosis not present

## 2021-09-09 DIAGNOSIS — K5730 Diverticulosis of large intestine without perforation or abscess without bleeding: Secondary | ICD-10-CM | POA: Diagnosis not present

## 2021-09-09 MED ORDER — IOHEXOL 300 MG/ML  SOLN
75.0000 mL | Freq: Once | INTRAMUSCULAR | Status: AC | PRN
  Administered 2021-09-09: 75 mL via INTRAVENOUS

## 2021-09-09 MED ORDER — GADOBUTROL 1 MMOL/ML IV SOLN
10.0000 mL | Freq: Once | INTRAVENOUS | Status: AC | PRN
  Administered 2021-09-09: 10 mL via INTRAVENOUS

## 2021-09-11 DIAGNOSIS — R5383 Other fatigue: Secondary | ICD-10-CM

## 2021-09-11 DIAGNOSIS — R0989 Other specified symptoms and signs involving the circulatory and respiratory systems: Secondary | ICD-10-CM | POA: Diagnosis not present

## 2021-09-11 DIAGNOSIS — I499 Cardiac arrhythmia, unspecified: Secondary | ICD-10-CM | POA: Diagnosis not present

## 2021-09-11 DIAGNOSIS — Z8679 Personal history of other diseases of the circulatory system: Secondary | ICD-10-CM

## 2021-09-20 DIAGNOSIS — Z85528 Personal history of other malignant neoplasm of kidney: Secondary | ICD-10-CM | POA: Diagnosis not present

## 2021-09-26 DIAGNOSIS — R0989 Other specified symptoms and signs involving the circulatory and respiratory systems: Secondary | ICD-10-CM | POA: Diagnosis not present

## 2021-09-26 DIAGNOSIS — Z8679 Personal history of other diseases of the circulatory system: Secondary | ICD-10-CM | POA: Diagnosis not present

## 2021-09-26 DIAGNOSIS — I499 Cardiac arrhythmia, unspecified: Secondary | ICD-10-CM | POA: Diagnosis not present

## 2021-09-26 DIAGNOSIS — R5383 Other fatigue: Secondary | ICD-10-CM | POA: Diagnosis not present

## 2021-09-27 DIAGNOSIS — Z85528 Personal history of other malignant neoplasm of kidney: Secondary | ICD-10-CM | POA: Diagnosis not present

## 2021-09-27 DIAGNOSIS — C641 Malignant neoplasm of right kidney, except renal pelvis: Secondary | ICD-10-CM | POA: Diagnosis not present

## 2021-09-28 ENCOUNTER — Encounter: Payer: Self-pay | Admitting: Family Medicine

## 2021-10-03 ENCOUNTER — Other Ambulatory Visit (HOSPITAL_BASED_OUTPATIENT_CLINIC_OR_DEPARTMENT_OTHER): Payer: Self-pay

## 2021-10-03 ENCOUNTER — Ambulatory Visit: Payer: Medicare HMO | Admitting: Cardiology

## 2021-10-03 ENCOUNTER — Encounter: Payer: Self-pay | Admitting: Cardiology

## 2021-10-03 ENCOUNTER — Other Ambulatory Visit: Payer: Self-pay

## 2021-10-03 VITALS — BP 104/72 | HR 91 | Ht 63.0 in | Wt 210.0 lb

## 2021-10-03 DIAGNOSIS — D49511 Neoplasm of unspecified behavior of right kidney: Secondary | ICD-10-CM

## 2021-10-03 DIAGNOSIS — I471 Supraventricular tachycardia, unspecified: Secondary | ICD-10-CM

## 2021-10-03 DIAGNOSIS — I10 Essential (primary) hypertension: Secondary | ICD-10-CM

## 2021-10-03 DIAGNOSIS — I493 Ventricular premature depolarization: Secondary | ICD-10-CM

## 2021-10-03 DIAGNOSIS — R0602 Shortness of breath: Secondary | ICD-10-CM

## 2021-10-03 HISTORY — DX: Supraventricular tachycardia, unspecified: I47.10

## 2021-10-03 HISTORY — DX: Ventricular premature depolarization: I49.3

## 2021-10-03 MED ORDER — METOPROLOL TARTRATE 25 MG PO TABS
25.0000 mg | ORAL_TABLET | Freq: Two times a day (BID) | ORAL | 3 refills | Status: DC
  Filled 2021-10-03: qty 180, 90d supply, fill #0

## 2021-10-03 NOTE — Progress Notes (Signed)
Cardiology Consultation:    Date:  10/03/2021   ID:  Karen Wall, DOB 04/04/1956, MRN [id number]  PCP:  Pearline Cables, MD  Cardiologist:  Gypsy Balsam, MD   Referring MD: Pearline Cables, MD   Chief Complaint  Patient presents with   Irregular Heart Beat  PVCs  History of Present Illness:    Karen Wall is a 66 y.o. female who is being seen today for the evaluation of PVCs at the request of Copland, Gwenlyn Found, MD. past medical history significant for essential hypertension successfully controlled with Lotrel, history of kidney cancer status post partial nephrectomy, history of thyroid cancer.  She was referred to [place] because she was complaining of having some palpitations which she mean by that she could hear and feel her heart stopping for a moment and then beating again.  Monitor showed frequent PVCs, also supraventricular tachycardia.  What was symptomatic or PVCs.  Supraventricular tachycardia for very short time insignificant.  Total burden of PVCs was 16.6%.  Overall otherwise she is doing well.  She does have some feet deformities therefore walking for her is difficult.  She does not do any exercises on the regular basis she admits that she lives majority of time sedentary lifestyle.  She denies have any recent chest pain tightness squeezing pressure burning chest she does get some shortness of breath while walking.  She never smoked.  However she does have family history of premature coronary artery disease.  Her father had first heart attack at the age of 7 however he was heavy smoker he eventually end up dying in his 93s.  She never passed out except when she was teenager.  She does not have family history of premature cardiac death.  She does not use excessive of coughing.  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   Numbness    Left side of face around mouth - unknown etiology   Numbness and tingling    left side - facial   Palpitations    Occurred during hyperthyroid   PONV (postoperative nausea and vomiting)    Right renal mass 07/2018   2.1 cm solid heterogeneously enhancing mass in posterior midpole    Sinus tachycardia by electrocardiogram 05/12/2018   Thrombocythemia    Thrombocytosis 11/22/2017   Thyroid cancer ([facility])    Thyroid disease    hyperthyroidism   Vitamin B 12 deficiency    Vitamin D deficiency     Past Surgical History:  Procedure Laterality Date   BREAST BIOPSY     age 51 - negative   CHOLECYSTECTOMY  1992   OPERATIVE ULTRASOUND Right 09/25/2018   Procedure: OPERATIVE ULTRASOUND;  Surgeon: Heloise Purpura, MD;  [facility]: [facility];  Service: Urology;  Laterality: Right;  ROBOTIC ASSITED PARTIAL NEPHRECTOMY Right 09/25/2018   Procedure: XI ROBOTIC RIGHT  ASSITED PARTIAL NEPHRECTOMY;  Surgeon: Heloise Purpura, MD;  [facility];  Service: Urology;  Laterality: Right;   THYROIDECTOMY N/A 07/03/2013   Procedure: TOTAL THYROIDECTOMY;  Surgeon: Velora Heckler, MD;  Location: [facility];  Service: General;  Laterality: N/A;    Current Medications: Current Meds  Medication Sig   acetaminophen (TYLENOL) 500 MG tablet Take 500 mg by mouth every 6 (six) hours as needed for pain.   amLODipine-benazepril (LOTREL) 5-20 MG capsule TAKE 1 CAPSULE BY MOUTH AT BEDTIME.   aspirin 81 MG EC tablet Take 81 mg by mouth daily. Swallow whole.   Cholecalciferol (VITAMIN D3) 1.25 MG (50000  UT) TABS Take 5,000 Units by mouth once a week.   CYANOCOBALAMIN SL Place 1 tablet under the tongue every other day.   FLUoxetine (PROZAC) 20 MG capsule ALTERNATE BETWEEN TAKING 1 CAPSULE BY MOUTH DAILY AND 2 CAPSULES BY MOUTH DAILY (Patient taking differently: Take 20 mg by mouth See admin instructions. 1 tab daily and alternate 2 tabs next day)   LORazepam (ATIVAN) 1 MG tablet TAKE 1 TABLET (1 MG TOTAL) BY MOUTH AT BEDTIME AS NEEDED FOR ANXIETY OR SLEEP. (Patient taking differently: Take 1 mg by mouth as needed for sleep.)   lovastatin (MEVACOR) 20 MG tablet TAKE 1 TABLET (20 MG TOTAL) BY MOUTH AT BEDTIME. (Patient taking differently: Take 20 mg by mouth at bedtime.)   thyroid (ARMOUR) 60 MG tablet Take 1 tablet (60 mg total) by mouth daily before breakfast.   [DISCONTINUED] ARMOUR THYROID 15 MG tablet TAKE 1 TABLET BY MOUTH ONCE DAILY ALONG WITH A 60 MG TABLET TO EQUAL 75 MG DOSE (Patient taking differently: Take 60 mg by mouth daily.)     Allergies:   Penicillins   Social History   Socioeconomic History   Marital status: Divorced    Spouse name: Not on file   Number of children: 1   Years of education: college   Highest education level: Associate degree: occupational, Scientist, product/process development, or vocational program  Occupational History   Occupation: Charity fundraiser  Tobacco Use   Smoking status: Never   Smokeless tobacco: Never  Vaping Use   Vaping Use: Never used  Substance and Sexual Activity   Alcohol use: No    Alcohol/week: 0.0 standard drinks   Drug use: No   Sexual activity: Never  Other Topics Concern   Not on file  Social History Narrative   Lives at home alone.   Right-handed.   2 cups caffeine daily.   Social Determinants of Health   Financial Resource Strain: Low Risk    Difficulty of Paying Living Expenses: Not hard at all  Food Insecurity: No Food Insecurity   Worried About Programme researcher, broadcasting/film/video in the Last Year: Never true   Ran Out of Food in the Last Year: Never true   Transportation Needs: No Transportation Needs   Lack of Transportation (Medical): No   Lack of Transportation (Non-Medical): No  Physical Activity: Insufficiently Active   Days of Exercise per Week: 4 days   Minutes of Exercise per Session: 30 min  Stress: No Stress Concern Present   Feeling of Stress : Only a little  Social Connections: Socially Isolated   Frequency of Communication with Friends and Family: Once a week   Frequency of Social Gatherings with Friends and Family: Once a week   Attends Religious Services: Never   Database administrator or  Organizations: No   Attends Engineer, structural: Never   Marital Status: Divorced     Family History: The patient's family history includes CAD in her father; COPD in her mother; Congestive Heart Failure in her paternal grandmother; Diabetes in her father, maternal grandmother, and mother; Heart attack in her maternal grandfather and paternal grandfather; Hyperlipidemia in her father and mother; Hypertension in her father and mother; Mental illness in her father and maternal grandmother; Stroke in her maternal grandfather and maternal grandmother. ROS:   Please see the history of present illness.    All 14 point review of systems negative except as described per history of present illness.  EKGs/Labs/Other Studies Reviewed:    The following studies were reviewed today: Monitor reviewed.  Frequent ventricular ectopy which is symptomatic with total burden of 16.6%  EKG:  EKG is  ordered today.  The ekg ordered today demonstrates normal sinus rhythm, normal P interval, nonspecific ST segment changes  Recent Labs: 07/25/2021: ALT 13; Hemoglobin 14.3; Platelet Count 482 09/07/2021: BUN 13; Creatinine, Ser 0.69; Potassium 4.9; Sodium 138; TSH 0.31  Recent Lipid Panel    Component Value Date/Time   CHOL 188 09/07/2021 1137   TRIG 80.0 09/07/2021 1137   HDL 60.80 09/07/2021 1137   CHOLHDL 3 09/07/2021 1137   VLDL 16.0  09/07/2021 1137   LDLCALC 111 (H) 09/07/2021 1137   LDLCALC 123 (H) 09/02/2019 1511    Physical Exam:    VS:  BP 104/72 (BP Location: Right Arm, Patient Position: Sitting)   Pulse 91   Ht 5\' 3"  (1.6 m)   Wt 210 lb (95.3 kg)   SpO2 97%   BMI 37.20 kg/m     Wt Readings from Last 3 Encounters:  10/03/21 210 lb (95.3 kg)  09/07/21 209 lb 6.4 oz (95 kg)  07/25/21 206 lb 8 oz (93.7 kg)     GEN:  Well nourished, well developed in no acute distress HEENT: Normal NECK: No JVD; No carotid bruits LYMPHATICS: No lymphadenopathy CARDIAC: RRR, no murmurs, no rubs, no gallops RESPIRATORY:  Clear to auscultation without rales, wheezing or rhonchi  ABDOMEN: Soft, non-tender, non-distended MUSCULOSKELETAL:  No edema; No deformity  SKIN: Warm and dry NEUROLOGIC:  Alert and oriented x 3 PSYCHIATRIC:  Normal affect   ASSESSMENT:    1. Essential hypertension   2. Ventricular ectopy   3. Supraventricular tachycardia (HCC)   4. Neoplasm of right kidney   5. Shortness of breath    PLAN:    In order of problems listed above:  Essential hypertension blood pressure actually is on the lower side.  I will ask her to discontinue Lotrel and will use metoprolol to help with the palpitations.  If that will not be sufficient to control her blood pressure then will go back and out some benazepril. Ventricular ectopy will need to stratify this phenomenon.  I will ask her to have echocardiogram to assess left ventricle ejection fraction.  In the meantime we will start with metoprolol 25 twice daily. Supraventricular tachycardia which was asymptomatic hopefully will be better managed with metoprolol. History of renal cancer status post surgery and doing well from that point review creatinine normal Shortness of breath we will get echocardiogram. Dyslipidemia I did review her K PN which show me her LDL of 111 HDL 60.  She is already on Mevacor 20 which I will continue for now.  We will do calculation her  risk before augmenting her therapy.   Medication Adjustments/Labs and  Tests Ordered: Current medicines are reviewed at length with the patient today.  Concerns regarding medicines are outlined above.  No orders of the defined types were placed in this encounter.  No orders of the defined types were placed in this encounter.   Signed, Georgeanna Lea, MD, [facility]. 10/03/2021 9:45 AM    [facility]

## 2021-10-03 NOTE — Patient Instructions (Signed)
Medication Instructions:  ?Your physician has recommended you make the following change in your medication:  ? ?START: Metoprolol 25 mg twice daily ?STOP: Lotrel ? ?*If you need a refill on your cardiac medications before your next appointment, please call your pharmacy* ? ? ?Lab Work: ?None ?If you have labs (blood work) drawn today and your tests are completely normal, you will receive your results only by: ?MyChart Message (if you have MyChart) OR ?A paper copy in the mail ?If you have any lab test that is abnormal or we need to change your treatment, we will call you to review the results. ? ? ?Testing/Procedures: ?Your physician has requested that you have an echocardiogram. Echocardiography is a painless test that uses sound waves to create images of your heart. It provides your doctor with information about the size and shape of your heart and how well your heart?s chambers and valves are working. This procedure takes approximately one hour. There are no restrictions for this procedure. ? ? ? ?Follow-Up: ?At [facility], you and your health needs are our priority.  As part of our continuing mission to provide you with exceptional heart care, we have created designated Provider Care Teams.  These Care Teams include your primary Cardiologist (physician) and Advanced Practice Providers (APPs -  Physician Assistants and Nurse Practitioners) who all work together to provide you with the care you need, when you need it. ? ?We recommend signing up for the patient portal called "MyChart".  Sign up information is provided on this After Visit Summary.  MyChart is used to connect with patients for Virtual Visits (Telemedicine).  Patients are able to view lab/test results, encounter notes, upcoming appointments, etc.  Non-urgent messages can be sent to your provider as well.   ?To learn more about what you can do with MyChart, go to [email or web address].   ? ?Your next appointment:   ?3 month(s) ? ?The format for  your next appointment:   ?In Person ? ?Provider:   ?Jenne Campus, MD  ? ? ?Other Instructions ?None ? ?

## 2021-10-06 ENCOUNTER — Encounter: Payer: Self-pay | Admitting: Cardiology

## 2021-10-12 ENCOUNTER — Encounter: Payer: Self-pay | Admitting: Family Medicine

## 2021-10-12 ENCOUNTER — Other Ambulatory Visit (HOSPITAL_BASED_OUTPATIENT_CLINIC_OR_DEPARTMENT_OTHER): Payer: Self-pay

## 2021-10-12 ENCOUNTER — Other Ambulatory Visit (INDEPENDENT_AMBULATORY_CARE_PROVIDER_SITE_OTHER): Payer: Medicare HMO

## 2021-10-12 DIAGNOSIS — E890 Postprocedural hypothyroidism: Secondary | ICD-10-CM

## 2021-10-12 LAB — TSH: TSH: 2.7 u[IU]/mL (ref 0.35–5.50)

## 2021-10-16 ENCOUNTER — Ambulatory Visit (HOSPITAL_BASED_OUTPATIENT_CLINIC_OR_DEPARTMENT_OTHER)
Admission: RE | Admit: 2021-10-16 | Discharge: 2021-10-16 | Disposition: A | Discharge status: Home / Self Care | Payer: Medicare HMO | Source: Ambulatory Visit | Attending: Cardiology | Admitting: Cardiology

## 2021-10-16 DIAGNOSIS — I471 Supraventricular tachycardia: Secondary | ICD-10-CM | POA: Diagnosis not present

## 2021-10-16 DIAGNOSIS — R0602 Shortness of breath: Secondary | ICD-10-CM | POA: Insufficient documentation

## 2021-10-16 DIAGNOSIS — I10 Essential (primary) hypertension: Secondary | ICD-10-CM | POA: Insufficient documentation

## 2021-10-16 DIAGNOSIS — I493 Ventricular premature depolarization: Secondary | ICD-10-CM | POA: Diagnosis not present

## 2021-10-16 DIAGNOSIS — D49511 Neoplasm of unspecified behavior of right kidney: Secondary | ICD-10-CM | POA: Diagnosis not present

## 2021-10-16 LAB — ECHOCARDIOGRAM COMPLETE
AR max vel: 1.93 cm2
AV Area VTI: 1.9 cm2
AV Area mean vel: 1.75 cm2
AV Mean grad: 5 mmHg
AV Peak grad: 9 mmHg
Ao pk vel: 1.5 m/s
Area-P 1/2: 3.99 cm2
S' Lateral: 3 cm

## 2021-10-16 NOTE — Progress Notes (Signed)
?  Echocardiogram ?2D Echocardiogram has been performed. ? ?Karen Wall ?10/16/2021, 12:50 PM ?

## 2021-11-24 ENCOUNTER — Inpatient Hospital Stay: Payer: Medicare HMO | Attending: Hematology & Oncology

## 2021-11-24 ENCOUNTER — Inpatient Hospital Stay: Payer: Medicare HMO | Admitting: Hematology & Oncology

## 2021-11-24 ENCOUNTER — Encounter: Payer: Self-pay | Admitting: Hematology & Oncology

## 2021-11-24 ENCOUNTER — Other Ambulatory Visit (HOSPITAL_BASED_OUTPATIENT_CLINIC_OR_DEPARTMENT_OTHER): Payer: Self-pay

## 2021-11-24 VITALS — BP 115/57 | HR 70 | Temp 98.5°F | Resp 18 | Ht 62.0 in | Wt 214.0 lb

## 2021-11-24 DIAGNOSIS — D75839 Thrombocytosis, unspecified: Secondary | ICD-10-CM | POA: Diagnosis not present

## 2021-11-24 DIAGNOSIS — Z79899 Other long term (current) drug therapy: Secondary | ICD-10-CM | POA: Diagnosis not present

## 2021-11-24 DIAGNOSIS — C641 Malignant neoplasm of right kidney, except renal pelvis: Secondary | ICD-10-CM | POA: Diagnosis not present

## 2021-11-24 DIAGNOSIS — Z880 Allergy status to penicillin: Secondary | ICD-10-CM | POA: Insufficient documentation

## 2021-11-24 DIAGNOSIS — D509 Iron deficiency anemia, unspecified: Secondary | ICD-10-CM | POA: Insufficient documentation

## 2021-11-24 DIAGNOSIS — K909 Intestinal malabsorption, unspecified: Secondary | ICD-10-CM

## 2021-11-24 LAB — CBC WITH DIFFERENTIAL (CANCER CENTER ONLY)
Abs Immature Granulocytes: 0.03 10*3/uL (ref 0.00–0.07)
Basophils Absolute: 0 10*3/uL (ref 0.0–0.1)
Basophils Relative: 0 %
Eosinophils Absolute: 0.1 10*3/uL (ref 0.0–0.5)
Eosinophils Relative: 1 %
HCT: 41.7 % (ref 36.0–46.0)
Hemoglobin: 13.5 g/dL (ref 12.0–15.0)
Immature Granulocytes: 0 %
Lymphocytes Relative: 27 %
Lymphs Abs: 2.3 10*3/uL (ref 0.7–4.0)
MCH: 28 pg (ref 26.0–34.0)
MCHC: 32.4 g/dL (ref 30.0–36.0)
MCV: 86.3 fL (ref 80.0–100.0)
Monocytes Absolute: 0.8 10*3/uL (ref 0.1–1.0)
Monocytes Relative: 9 %
Neutro Abs: 5.4 10*3/uL (ref 1.7–7.7)
Neutrophils Relative %: 63 %
Platelet Count: 434 10*3/uL — ABNORMAL HIGH (ref 150–400)
RBC: 4.83 MIL/uL (ref 3.87–5.11)
RDW: 14.3 % (ref 11.5–15.5)
WBC Count: 8.7 10*3/uL (ref 4.0–10.5)
nRBC: 0 % (ref 0.0–0.2)

## 2021-11-24 LAB — CMP (CANCER CENTER ONLY)
ALT: 12 U/L (ref 0–44)
AST: 16 U/L (ref 15–41)
Albumin: 4 g/dL (ref 3.5–5.0)
Alkaline Phosphatase: 51 U/L (ref 38–126)
Anion gap: 7 (ref 5–15)
BUN: 14 mg/dL (ref 8–23)
CO2: 27 mmol/L (ref 22–32)
Calcium: 8.9 mg/dL (ref 8.9–10.3)
Chloride: 103 mmol/L (ref 98–111)
Creatinine: 0.7 mg/dL (ref 0.44–1.00)
GFR, Estimated: >60 mL/min (ref >60)
Glucose, Bld: 93 mg/dL (ref 70–99)
Potassium: 4.3 mmol/L (ref 3.5–5.1)
Sodium: 137 mmol/L (ref 135–145)
Total Bilirubin: 0.4 mg/dL (ref 0.3–1.2)
Total Protein: 6.9 g/dL (ref 6.5–8.1)

## 2021-11-24 LAB — SAVE SMEAR(SSMR), FOR PROVIDER SLIDE REVIEW

## 2021-11-24 LAB — IRON AND IRON BINDING CAPACITY (CC-WL,HP ONLY)
Iron: 105 ug/dL (ref 28–170)
Saturation Ratios: 30 % (ref 10.4–31.8)
TIBC: 354 ug/dL (ref 250–450)
UIBC: 249 ug/dL (ref 148–442)

## 2021-11-24 LAB — LACTATE DEHYDROGENASE: LDH: 145 U/L (ref 98–192)

## 2021-11-24 LAB — FERRITIN: Ferritin: 83 ng/mL (ref 11–307)

## 2021-11-24 NOTE — Progress Notes (Signed)
? ?Hematology and Oncology Follow Up Visit ?  ?Karen Wall ?[id number] ?July 06, 1956 66 y.o. ?10/27/2018 ?  ?  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  ?Current Therapy:        ?EC ASA 81 mg po q day ?IV Iron as needed --last dose given on 08/28/2019 ?Hydrea 500 mg po q day -- not started ?                                     ?Interim History:  Karen Wall is in for follow-up.  We last saw her back in January.  Since then, she has been doing well.  She really has had no complaints. ? ?She can have a wonderful Mother's Day weekend.  Her son is gradual with a PhD from [employer] with a degree in astrophysics.  I am incredibly impressed by this. ? ?She has had no problems or self.  She is on metoprolol now.  She apparently has some atrial arrhythmia.  I do not know if this was atrial fibrillation.  She does not like being on the metoprolol.  It makes her feel tired. ? ?Her last iron studies back in January showed a ferritin of 319 with an iron saturation of 27%. ? ?She has had no change in bowel or bladder habits.  She has had no cough or shortness of breath.  There has been no nausea or vomiting. ? ?Overall, her performance status right now is ECOG 1.   ?  ?Medications:  ?Current Outpatient Medications:  ?  acetaminophen (TYLENOL) 500 MG tablet, Take 500 mg by mouth every 6 (six) hours as needed for pain., Disp: , Rfl:  ?  amLODipine-benazepril (LOTREL) 5-20 MG capsule, Take 1 capsule by mouth at bedtime., Disp: 90 capsule, Rfl: 1 ?  LORazepam (ATIVAN) 1 MG tablet, Take 1 tablet (1 mg total) by mouth at bedtime as needed for anxiety or sleep., Disp: 30 tablet, Rfl: 0 ?  lovastatin (MEVACOR) 20 MG tablet, Take 1 tablet (20 mg total) by mouth at bedtime., Disp: 90 tablet, Rfl: 3 ?  thyroid (ARMOUR THYROID) 15 MG tablet, Take 1 tablet (15 mg  total) by mouth daily. Along with the 60 mg tablet. (Patient taking differently: Take 15 mg by mouth daily before breakfast. Along with the 60 mg tablet.), Disp: 90 tablet, Rfl: 3 ?  thyroid (ARMOUR THYROID) 60 MG tablet, Take every day along with the 15 mg tablet. (Patient taking differently: Take 60 mg by mouth daily before breakfast. Take every day along with the 15 mg tablet.), Disp: 90 tablet, Rfl: 3 ?  traMADol (ULTRAM) 50 MG tablet, Take 1-2 tablets (50-100 mg total) by mouth every 6 (six) hours as needed for moderate pain or severe pain., Disp: 20 tablet, Rfl: 0 ?  ?Allergies:  ?     ?Allergies  ?Allergen Reactions  ? Penicillins Shortness Of Breath  ?    Did it involve swelling of the face/tongue/throat, SOB, or low BP? Yes ?Did it involve sudden or severe rash/hives, skin peeling, or any reaction on the inside of your mouth or nose? No ?Did you need to seek medical attention at a hospital or doctor's office? Unknown ?When did it last happen?  17-24 years old     ?If all above  answers are ?NO?, may proceed with cephalosporin use. ?   ?  ?  ?Past Medical History, Surgical history, Social history, and Family History were reviewed and updated. ?  ?Review of Systems: ?Review of Systems  ?Constitutional: Negative.   ?HENT:  Negative.   ?Eyes: Negative.   ?Respiratory: Negative.   ?Cardiovascular: Negative.   ?Gastrointestinal: Negative.   ?Endocrine: Negative.   ?Genitourinary: Negative.    ?Musculoskeletal: Negative.   ?Skin: Negative.   ?Neurological: Negative.   ?Hematological: Negative.   ?Psychiatric/Behavioral: Negative.   ?  ?  ?Physical Exam: ?Vital signs this visit show temperature of 97.1.  Pulse is 78.  Blood pressure 100/66.  Weight is 209 pounds. ?  ?   ?Wt Readings from Last 3 Encounters:  ?09/25/18 213 lb (96.6 kg)  ?09/23/18 213 lb (96.6 kg)  ?09/08/18 198 lb (89.8 kg)  ?  ?  ?Physical Exam ?Vitals signs reviewed.  ?HENT:  ?   Head: Normocephalic and atraumatic.  ?Eyes:  ?   Pupils: Pupils are  equal, round, and reactive to light.  ?Neck:  ?   Musculoskeletal: Normal range of motion.  ?Cardiovascular:  ?   Rate and Rhythm: Normal rate and regular rhythm.  ?   Heart sounds: Normal heart sounds.  ?Pulmonary:  ?   Effort: Pulmonary effort is normal.  ?   Breath sounds: Normal breath sounds.  ?Abdominal:  ?   General: Bowel sounds are normal.  ?   Palpations: Abdomen is soft.  ?Musculoskeletal: Normal range of motion.     ?   General: No tenderness or deformity.  ?Lymphadenopathy:  ?   Cervical: No cervical adenopathy.  ?Skin: ?   General: Skin is warm and dry.  ?   Findings: No erythema or rash.  ?Neurological:  ?   Mental Status: She is alert and oriented to person, place, and time.  ?Psychiatric:     ?   Behavior: Behavior normal.     ?   Thought Content: Thought content normal.     ?   Judgment: Judgment normal.  ?  ?  ?  ?  ?Recent Labs  ?     ?Lab Results  ?Component Value Date  ?  WBC 7.3 09/23/2018  ?  HGB 12.0 09/26/2018  ?  HCT 39.4 09/26/2018  ?  MCV 87.6 09/23/2018  ?  PLT 479 (H) 09/23/2018  ?  ?  ?  Chemistry    ?Labs (Brief)  ?     ?   ?Component Value Date/Time  ?  NA 137 09/26/2018 0339  ?  K 4.0 09/26/2018 0339  ?  CL 105 09/26/2018 0339  ?  CO2 24 09/26/2018 0339  ?  BUN 9 09/26/2018 0339  ?  CREATININE 0.70 09/26/2018 0339  ?  CREATININE 0.81 09/08/2018 1148  ?  CREATININE 0.74 05/18/2015 1356  ?  ?  Labs (Brief)  ?     ?   ?Component Value Date/Time  ?  CALCIUM 8.6 (L) 09/26/2018 0339  ?  ALKPHOS 52 09/08/2018 1148  ?  AST 13 (L) 09/08/2018 1148  ?  ALT 13 09/08/2018 1148  ?  BILITOT 0.2 (L) 09/08/2018 1148  ?  ?   ?  ?  ?Impression and Plan: ?Karen Wall is a 66 year old white female.    She has thrombocythemia.  She is on no medication for this right now.  Platelet count is doing quite nicely.  We do watch her iron studies. ? ?  I forgot to mention that she was seen by urology in March.  She had a CT scan which looked fine with respect to the kidney cancer.  She says that she does not need  any additional scans for a year.   ? ?I will plan to see her back in 6 months now. ? ? ?Karen Napoleon, MD ?6/11/202012:28 PM ? ?

## 2021-12-07 ENCOUNTER — Encounter: Payer: Self-pay | Admitting: Cardiology

## 2021-12-07 ENCOUNTER — Ambulatory Visit: Payer: Medicare HMO | Admitting: Cardiology

## 2021-12-07 ENCOUNTER — Other Ambulatory Visit (HOSPITAL_BASED_OUTPATIENT_CLINIC_OR_DEPARTMENT_OTHER): Payer: Self-pay

## 2021-12-07 VITALS — BP 114/76 | HR 64 | Ht 62.0 in | Wt 213.0 lb

## 2021-12-07 DIAGNOSIS — R0602 Shortness of breath: Secondary | ICD-10-CM

## 2021-12-07 DIAGNOSIS — I493 Ventricular premature depolarization: Secondary | ICD-10-CM

## 2021-12-07 DIAGNOSIS — I471 Supraventricular tachycardia: Secondary | ICD-10-CM

## 2021-12-07 MED ORDER — DILTIAZEM HCL ER COATED BEADS 120 MG PO CP24
120.0000 mg | ORAL_CAPSULE | Freq: Every day | ORAL | 0 refills | Status: DC
  Filled 2021-12-07: qty 90, 90d supply, fill #0

## 2021-12-07 NOTE — Progress Notes (Signed)
Cardiology Office Note:    Date:  12/07/2021   ID:  Karen Wall, DOB Nov 25, 1955, MRN [id number]  PCP:  Darreld Mclean, MD  Cardiologist:  Jenne Campus, MD    Referring MD: Darreld Mclean, MD   Chief Complaint  Patient presents with   Medication Management    Heart doing well with Metoprolol however causing pulse to decrease and fatigue    History of Present Illness:    Karen Wall is a 66 y.o. 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  She is in my office today for follow-up overall she said palpitations that dramatically improved but overall she feels very poor she is weak tired and exhausted.  She says she cannot really function with metoprolol on board.  Denies have any dizziness or passing out.  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   Numbness    Left side of face around mouth - unknown etiology   Numbness and tingling    left side - facial   Palpitations    Occurred during hyperthyroid   PONV (postoperative nausea and vomiting)    Right renal mass 07/2018   2.1 cm solid heterogeneously enhancing mass in posterior midpole    Sinus tachycardia by electrocardiogram 05/12/2018   Thrombocythemia    Thrombocytosis 11/22/2017   Thyroid cancer ([place])     Thyroid disease    hyperthyroidism   Vitamin B 12 deficiency    Vitamin D deficiency     Past Surgical History:  Procedure Laterality Date   BREAST BIOPSY     age 66 - negative   CHOLECYSTECTOMY  1992   OPERATIVE ULTRASOUND Right 09/25/2018   Procedure: OPERATIVE ULTRASOUND;  Surgeon: Raynelle Bring, MD;  Location: [facility];  Service: Urology;  Laterality: Right;   ROBOTIC ASSITED PARTIAL NEPHRECTOMY Right 09/25/2018   Procedure: XI ROBOTIC RIGHT  ASSITED PARTIAL NEPHRECTOMY;  Surgeon: Raynelle Bring, MD;  Location: [facility];  Service: Urology;  Laterality: Right;   THYROIDECTOMY N/A 07/03/2013   Procedure: TOTAL THYROIDECTOMY;  Surgeon: Earnstine Regal, MD;  Location: [facility];  Service: General;  Laterality: N/A;    Current Medications: Current Meds  Medication Sig   acetaminophen (TYLENOL) 500 MG tablet Take 500 mg by mouth every 6 (six) hours as needed for pain.   aspirin 81 MG EC tablet Take 81 mg by mouth daily. Swallow whole.   Cholecalciferol (VITAMIN D3) 1.25 MG (50000 UT) TABS Take 5,000 Units by mouth once a week.   CYANOCOBALAMIN SL Place 1 tablet under the tongue every other day.   FLUoxetine (PROZAC) 20 MG capsule ALTERNATE BETWEEN TAKING 1 CAPSULE BY MOUTH DAILY AND 2 CAPSULES  BY MOUTH DAILY (Patient taking differently: Take 20 mg by mouth See admin instructions. 1 tab daily and alternate 2 tabs next day)   LORazepam (ATIVAN) 1 MG tablet Take 1 mg by mouth as needed for anxiety or sleep.   lovastatin (MEVACOR) 20 MG tablet TAKE 1 TABLET (20 MG TOTAL) BY MOUTH AT BEDTIME. (Patient taking differently: Take 20 mg by mouth at bedtime.)   metoprolol tartrate (LOPRESSOR) 25 MG tablet Take 1 tablet (25 mg total) by mouth 2 (two) times daily.   thyroid (ARMOUR) 60 MG tablet Take 1 tablet (60 mg total) by mouth daily before breakfast.     Allergies:   Penicillins   Social History   Socioeconomic History   Marital status: Divorced    Spouse name: Not on file   Number of children: 1    Years of education: college   Highest education level: Associate degree: occupational, Hotel manager, or vocational program  Occupational History   Occupation: Therapist, sports  Tobacco Use   Smoking status: Never   Smokeless tobacco: Never  Vaping Use   Vaping Use: Never used  Substance and Sexual Activity   Alcohol use: No    Alcohol/week: 0.0 standard drinks   Drug use: No   Sexual activity: Never  Other Topics Concern   Not on file  Social History Narrative   Lives at home alone.   Right-handed.   2 cups caffeine daily.   Social Determinants of Health   Financial Resource Strain: Low Risk    Difficulty of Paying Living Expenses: Not hard at all  Food Insecurity: No Food Insecurity   Worried About Charity fundraiser in the Last Year: Never true   [place] in the Last Year: Never true  Transportation Needs: No Transportation Needs   Lack of Transportation (Medical): No   Lack of Transportation (Non-Medical): No  Physical Activity: Insufficiently Active   Days of Exercise per Week: 4 days   Minutes of Exercise per Session: 30 min  Stress: No Stress Concern Present   Feeling of Stress : Only a little  Social Connections: Socially Isolated   Frequency of Communication with Friends and Family: Once a week   Frequency of Social Gatherings with Friends and Family: Once a week   Attends Religious Services: Never   Marine scientist or Organizations: No   Attends Music therapist: Never   Marital Status: Divorced     Family History: The patient's family history includes CAD in her father; COPD in her mother; Congestive Heart Failure in her paternal grandmother; Diabetes in her father, maternal grandmother, and mother; Heart attack in her maternal grandfather and paternal grandfather; Hyperlipidemia in her father and mother; Hypertension in her father and mother; Mental illness in her father and maternal grandmother; Stroke in her maternal grandfather and maternal  grandmother. ROS:   Please see the history of present illness.    All 14 point review of systems negative except as described per history of present illness  EKGs/Labs/Other Studies Reviewed:      Recent Labs: 10/12/2021: TSH 2.70 11/24/2021: ALT 12; BUN 14; Creatinine 0.70; Hemoglobin 13.5; Platelet Count 434; Potassium 4.3; Sodium 137  Recent Lipid Panel    Component Value Date/Time   CHOL 188 09/07/2021 1137   TRIG 80.0 09/07/2021 1137   HDL 60.80 09/07/2021 1137   CHOLHDL 3 09/07/2021 1137   VLDL 16.0 09/07/2021 1137   LDLCALC 111 (H) 09/07/2021 1137   LDLCALC 123 (H) 09/02/2019  1511    Physical Exam:    VS:  BP 114/76 (BP Location: Left Arm, Patient Position: Sitting)   Pulse 64   Ht '5\' 2"'$  (1.575 m)   Wt 213 lb (96.6 kg)   SpO2 96%   BMI 38.96 kg/m     Wt Readings from Last 3 Encounters:  12/07/21 213 lb (96.6 kg)  11/24/21 214 lb (97.1 kg)  10/03/21 210 lb (95.3 kg)     GEN:  Well nourished, well developed in no acute distress HEENT: Normal NECK: No JVD; No carotid bruits LYMPHATICS: No lymphadenopathy CARDIAC: RRR, no murmurs, no rubs, no gallops RESPIRATORY:  Clear to auscultation without rales, wheezing or rhonchi  ABDOMEN: Soft, non-tender, non-distended MUSCULOSKELETAL:  No edema; No deformity  SKIN: Warm and dry LOWER EXTREMITIES: no swelling NEUROLOGIC:  Alert and oriented x 3 PSYCHIATRIC:  Normal affect   ASSESSMENT:    1. Ventricular ectopy   2. Supraventricular tachycardia ([place])   3. Shortness of breath    PLAN:    In order of problems listed above:  Ventricular ectopy she is not responding nicely to beta-blocker but unable to tolerate even small dose of beta-blocker, therefore I will try to use calcium channel blocker if that does not work then we may be forced to use medication like mexiletine.  What I will do right now is simply stop her metoprolol put her on Cardizem CD 120 daily.  Good news is that her left ventricle ejection fraction  is normal without evidence of heart weakening because of PVCs therefore I do not think we dealing with PVCs induced cardiomyopathy. Supraventricular tachycardia: Controlled with beta-blocker but discussion as above with switch to calcium channel blocker Shortness of breath chronic problem.  No changes.  He said that she does not feel well on beta-blocker even though her heart seems to be responding quite nicely. Dyslipidemia I did review K PN which show me her LDL of 111 HDL 60.  She is on Mevacor 20 which I will continue for now but in the future we will talk about potentially augmenting that medications.  I prefer not to do anything right now not to mix the pictures will take care of PVCs first before proceeding with adjustment of her medication for cholesterol   Medication Adjustments/Labs and Tests Ordered: Current medicines are reviewed at length with the patient today.  Concerns regarding medicines are outlined above.  No orders of the defined types were placed in this encounter.  Medication changes: No orders of the defined types were placed in this encounter.   Signed, Park Liter, MD, [facility] 12/07/2021 11:23 AM    [place]

## 2021-12-07 NOTE — Patient Instructions (Signed)
Medication Instructions:  Your physician has recommended you make the following change in your medication: STOP: Metoprolol START: Cardizem CD '120mg'$  1 tablet by mouth daily    Lab Work: None Ordered If you have labs (blood work) drawn today and your tests are completely normal, you will receive your results only by: [place] (if you have MyChart) OR A paper copy in the mail If you have any lab test that is abnormal or we need to change your treatment, we will call you to review the results.   Testing/Procedures: None Ordered   Follow-Up: At [facility], you and your health needs are our priority.  As part of our continuing mission to provide you with exceptional heart care, we have created designated Provider Care Teams.  These Care Teams include your primary Cardiologist (physician) and Advanced Practice Providers (APPs -  Physician Assistants and Nurse Practitioners) who all work together to provide you with the care you need, when you need it.  We recommend signing up for the patient portal called "MyChart".  Sign up information is provided on this After Visit Summary.  MyChart is used to connect with patients for Virtual Visits (Telemedicine).  Patients are able to view lab/test results, encounter notes, upcoming appointments, etc.  Non-urgent messages can be sent to your provider as well.   To learn more about what you can do with MyChart, go to [email or web address].    Your next appointment:   3 month(s)  The format for your next appointment:   In Person  Provider:   Jenne Campus, MD    Other Instructions NA

## 2021-12-18 ENCOUNTER — Encounter: Payer: Self-pay | Admitting: Cardiology

## 2022-01-04 ENCOUNTER — Encounter: Payer: Self-pay | Admitting: Family

## 2022-01-04 ENCOUNTER — Other Ambulatory Visit: Payer: Self-pay | Admitting: Family Medicine

## 2022-01-04 ENCOUNTER — Other Ambulatory Visit (HOSPITAL_BASED_OUTPATIENT_CLINIC_OR_DEPARTMENT_OTHER): Payer: Self-pay

## 2022-01-04 DIAGNOSIS — E785 Hyperlipidemia, unspecified: Secondary | ICD-10-CM

## 2022-01-04 MED ORDER — LORAZEPAM 1 MG PO TABS
1.0000 mg | ORAL_TABLET | ORAL | 0 refills | Status: DC | PRN
  Filled 2022-01-04: qty 30, 30d supply, fill #0

## 2022-01-04 MED ORDER — LOVASTATIN 20 MG PO TABS
ORAL_TABLET | Freq: Every day | ORAL | 3 refills | Status: DC
  Filled 2022-01-04: qty 90, 90d supply, fill #0
  Filled 2022-06-19: qty 90, 90d supply, fill #1
  Filled 2022-09-11: qty 90, 90d supply, fill #2

## 2022-01-04 MED ORDER — THYROID 60 MG PO TABS
60.0000 mg | ORAL_TABLET | Freq: Every day | ORAL | 1 refills | Status: DC
  Filled 2022-01-04: qty 90, 90d supply, fill #0

## 2022-01-11 ENCOUNTER — Telehealth: Payer: Self-pay

## 2022-01-11 NOTE — Telephone Encounter (Signed)
Dr. Agustin Cree review BP log patient brought here and advised verbally to continue with present management to continue monitoring BP. Patient notified via voicemail(ok per DPR).

## 2022-02-28 ENCOUNTER — Other Ambulatory Visit: Payer: Self-pay | Admitting: Cardiology

## 2022-03-01 ENCOUNTER — Other Ambulatory Visit (HOSPITAL_BASED_OUTPATIENT_CLINIC_OR_DEPARTMENT_OTHER): Payer: Self-pay

## 2022-03-01 MED ORDER — DILTIAZEM HCL ER COATED BEADS 120 MG PO CP24
120.0000 mg | ORAL_CAPSULE | Freq: Every day | ORAL | 2 refills | Status: DC
  Filled 2022-03-01: qty 90, 90d supply, fill #0

## 2022-03-01 NOTE — Telephone Encounter (Signed)
Rx refill sent to pharmacy. 

## 2022-03-04 NOTE — Patient Instructions (Incomplete)
It was great to see you again today I will be in touch with your labs, assuming all is well please see me in 6 to 12 months I recommend getting a flu shot and COVID booster this fall Would also recommend getting the shingles vaccine series at your pharmacy  Let me touch base with Dr Cruzita Lederer about potentially starting you on a GLP-1 drug for weight loss- I want to double check that this is ok with your type of thyroid cancer  We can also certainly change your thyroid med over to levothyroxine if you like!

## 2022-03-04 NOTE — Progress Notes (Unsigned)
[place] at [facility] [address] [place], [place] [address] [phone]  Date:  03/07/2022   Name:  Karen Wall   DOB:  28-Oct-1955   MRN:  [id number]  PCP:  Darreld Mclean, MD    Chief Complaint: No chief complaint on file.   History of Present Illness:  Karen Wall is a 66 y.o. very pleasant female patient who presents with the following:  Patient seen today for periodic follow-up Most recent visit with myself was in [place] that time she had some concern of palpitations, Zio patch showed sinus rhythm with 13% PVC.  Referral sent to cardiology She was seen by Dr. Raliegh Ip, most recent visit in May: Beta-blockers were helpful in suppressing her ectopy but were causing a lot of sedation.  In May she was changed from metoprolol to Cardizem CD  History of thyroid cancer s/p thyroidectomy 2014, HTN, renal cancer s/p partial nephrectomy 09/2018, iron def, essential thrombocytosis.  She also has chronic foot problems which limit her mobility Her urologist for history of renal cancer is Dr. Alinda Money She also follows up with Dr. Clide Deutscher recent visit in May Per his note, CT scan earlier this year per urology looked good.  Continue surveillance They are also monitoring thrombocytopenia  Shingrix Recommend COVID-19 booster and flu shot this fall It looks like Cologuard is due for update  Lab work done in May-CMP, iron studies, CBC Most recent TSH in March, A1c, vitamin D and lipids in February  Mammogram is up-to-date, DEXA scan can be updated  Aspirin 81 Diltiazem 120 Fluoxetine 20 Hydroxyurea-?  Taking Lorazepam as needed Lovastatin 20 Armour Thyroid 60 mg Patient Active Problem List   Diagnosis Date Noted   Ventricular ectopy 10/03/2021   Supraventricular tachycardia ([place]) 10/03/2021   Seizure disorder ([place]) 05/25/2019   Neoplasm of right kidney 09/25/2018   Cancer of kidney parenchyma, right ([place])  09/08/2018   Essential hypertension 05/12/2018   Numbness and tingling of left side of face 05/06/2018   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   Thrombocytosis 11/22/2017   Bilateral ankle pain 10/15/2017   IDA (iron deficiency anemia) 07/23/2016   Vitamin D deficiency 03/15/2016   Low vitamin B12 level 03/15/2016   Dyspnea 05/28/2014   Chest tightness 05/28/2014   Thyroid cancer, 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   Postsurgical hypothyroidism 07/24/2013    Past Medical History:  Diagnosis Date   A-fib [facility])    history of   Acquired flat foot    Ankle pain    bilateral   Arthritis    feet ankles, and knees    Cancer of kidney parenchyma, right ([place]) 09/08/2018   Depression    Diverticulosis    Dyspnea    with exertion    Dysrhythmia    hx of afib with hyperthroid no issues currently    GERD (gastroesophageal reflux disease)    H/O hiatal hernia 08/09/2018   Moderate to Large   Hyperlipidemia    Hypertension    Hypothyroidism    Iron deficiency anemia    iron deficiency last iron infusion 08/2018   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   Numbness    Left side of face around mouth - unknown etiology   Numbness and tingling    left side - facial   Palpitations    Occurred during hyperthyroid   PONV (postoperative nausea and vomiting)    Right renal mass 07/2018   2.1 cm solid heterogeneously  enhancing mass in posterior midpole    Sinus tachycardia by electrocardiogram 05/12/2018   Thrombocythemia    Thrombocytosis 11/22/2017   Thyroid cancer ([place])    Thyroid disease    hyperthyroidism   Vitamin B 12 deficiency    Vitamin D deficiency     Past Surgical History:  Procedure Laterality Date   BREAST BIOPSY     age 70 - negative   CHOLECYSTECTOMY  1992   OPERATIVE ULTRASOUND Right 09/25/2018   Procedure: OPERATIVE ULTRASOUND;  Surgeon: Raynelle Bring, MD;  Location: [facility];  Service: Urology;  Laterality: Right;   ROBOTIC ASSITED PARTIAL NEPHRECTOMY Right  09/25/2018   Procedure: XI ROBOTIC RIGHT  ASSITED PARTIAL NEPHRECTOMY;  Surgeon: Raynelle Bring, MD;  Location: [facility];  Service: Urology;  Laterality: Right;   THYROIDECTOMY N/A 07/03/2013   Procedure: TOTAL THYROIDECTOMY;  Surgeon: Earnstine Regal, MD;  Location: [facility];  Service: General;  Laterality: N/A;    Social History   Tobacco Use   Smoking status: Never   Smokeless tobacco: Never  Vaping Use   Vaping Use: Never used  Substance Use Topics   Alcohol use: No    Alcohol/week: 0.0 standard drinks of alcohol   Drug use: No    Family History  Problem Relation Age of Onset   Hypertension Mother    Diabetes Mother    Hyperlipidemia Mother    COPD Mother    CAD Father        MI in his 29s   Diabetes Father    Hyperlipidemia Father    Hypertension Father    Mental illness Father    Mental illness Maternal Grandmother    Stroke Maternal Grandmother    Diabetes Maternal Grandmother    Stroke Maternal Grandfather    Heart attack Maternal Grandfather    Congestive Heart Failure Paternal Grandmother    Heart attack Paternal Grandfather     Allergies  Allergen Reactions   Penicillins Shortness Of Breath    Did it involve swelling of the face/tongue/throat, SOB, or low BP? Yes Did it involve sudden or severe rash/hives, skin peeling, or any reaction on the inside of your mouth or nose? No Did you need to seek medical attention at a hospital or doctor's office? Unknown When did it last happen?  40-62 years old     If all above answers are "NO", may proceed with cephalosporin use.     Medication list has been reviewed and updated.  Current Outpatient Medications on File Prior to Visit  Medication Sig Dispense Refill   acetaminophen (TYLENOL) 500 MG tablet Take 500 mg by mouth every 6 (six) hours as needed for pain.     aspirin 81 MG EC tablet Take 81 mg by mouth daily. Swallow whole.     Cholecalciferol (VITAMIN D3) 1.25 MG (50000 UT) TABS Take 5,000 Units by mouth once a  week.     CYANOCOBALAMIN SL Place 1 tablet under the tongue every other day.     diltiazem (CARTIA XT) 120 MG 24 hr capsule Take 1 capsule (120 mg total) by mouth daily. 90 capsule 2   FLUoxetine (PROZAC) 20 MG capsule ALTERNATE BETWEEN TAKING 1 CAPSULE BY MOUTH DAILY AND 2 CAPSULES BY MOUTH DAILY (Patient taking differently: Take 20 mg by mouth See admin instructions. 1 tab daily and alternate 2 tabs next day) 180 capsule 1   hydroxyurea (HYDREA) 500 MG capsule Take 1 capsule (500 mg total) by mouth daily. May take with food to  minimize GI side effects. (Patient not taking: Reported on 12/07/2021) 30 capsule 6   LORazepam (ATIVAN) 1 MG tablet Take 1 tablet (1 mg total) by mouth as needed for anxiety or sleep. 30 tablet 0   lovastatin (MEVACOR) 20 MG tablet TAKE 1 TABLET (20 MG TOTAL) BY MOUTH AT BEDTIME. 90 tablet 3   thyroid (ARMOUR THYROID) 60 MG tablet Take 1 tablet (60 mg total) by mouth daily before breakfast. 90 tablet 1   [DISCONTINUED] metoprolol tartrate (LOPRESSOR) 25 MG tablet Take 1 tablet (25 mg total) by mouth 2 (two) times daily. 180 tablet 3   No current facility-administered medications on file prior to visit.    Review of Systems:  As per HPI- otherwise negative.   Physical Examination: There were no vitals filed for this visit. There were no vitals filed for this visit. There is no height or weight on file to calculate BMI. Ideal Body Weight:    GEN: no acute distress. HEENT: Atraumatic, Normocephalic.  Ears and Nose: No external deformity. CV: RRR, No M/G/R. No JVD. No thrill. No extra heart sounds. PULM: CTA B, no wheezes, crackles, rhonchi. No retractions. No resp. distress. No accessory muscle use. ABD: S, NT, ND, +BS. No rebound. No HSM. EXTR: No c/c/e PSYCH: Normally interactive. Conversant.    Assessment and Plan: ***  Signed Lamar Blinks, MD

## 2022-03-07 ENCOUNTER — Ambulatory Visit (INDEPENDENT_AMBULATORY_CARE_PROVIDER_SITE_OTHER): Payer: Medicare HMO | Admitting: Family Medicine

## 2022-03-07 ENCOUNTER — Other Ambulatory Visit: Payer: Self-pay | Admitting: Family Medicine

## 2022-03-07 ENCOUNTER — Other Ambulatory Visit (HOSPITAL_BASED_OUTPATIENT_CLINIC_OR_DEPARTMENT_OTHER): Payer: Self-pay

## 2022-03-07 VITALS — BP 120/72 | HR 89 | Temp 97.6°F | Resp 18 | Ht 63.0 in | Wt 218.0 lb

## 2022-03-07 DIAGNOSIS — E785 Hyperlipidemia, unspecified: Secondary | ICD-10-CM

## 2022-03-07 DIAGNOSIS — Z131 Encounter for screening for diabetes mellitus: Secondary | ICD-10-CM

## 2022-03-07 DIAGNOSIS — I10 Essential (primary) hypertension: Secondary | ICD-10-CM

## 2022-03-07 DIAGNOSIS — Z1211 Encounter for screening for malignant neoplasm of colon: Secondary | ICD-10-CM | POA: Diagnosis not present

## 2022-03-07 DIAGNOSIS — E890 Postprocedural hypothyroidism: Secondary | ICD-10-CM

## 2022-03-07 DIAGNOSIS — E2839 Other primary ovarian failure: Secondary | ICD-10-CM

## 2022-03-07 LAB — BASIC METABOLIC PANEL
BUN: 12 mg/dL (ref 6–23)
CO2: 25 mEq/L (ref 19–32)
Calcium: 8.7 mg/dL (ref 8.4–10.5)
Chloride: 103 mEq/L (ref 96–112)
Creatinine, Ser: 0.7 mg/dL (ref 0.40–1.20)
GFR: 90.44 mL/min (ref >60.00)
Glucose, Bld: 90 mg/dL (ref 70–99)
Potassium: 4.6 mEq/L (ref 3.5–5.1)
Sodium: 138 mEq/L (ref 135–145)

## 2022-03-07 LAB — TSH: TSH: 16.21 u[IU]/mL — ABNORMAL HIGH (ref 0.35–5.50)

## 2022-03-07 LAB — HEMOGLOBIN A1C: Hgb A1c MFr Bld: 5.7 % (ref 4.6–6.5)

## 2022-03-07 LAB — T3, FREE: T3, Free: 3.3 pg/mL (ref 2.3–4.2)

## 2022-03-07 MED ORDER — FLUOXETINE HCL 20 MG PO CAPS
ORAL_CAPSULE | ORAL | 1 refills | Status: DC
  Filled 2022-03-07: qty 180, 90d supply, fill #0
  Filled 2022-06-19: qty 180, 90d supply, fill #1

## 2022-03-08 ENCOUNTER — Other Ambulatory Visit (HOSPITAL_BASED_OUTPATIENT_CLINIC_OR_DEPARTMENT_OTHER): Payer: Self-pay

## 2022-03-08 ENCOUNTER — Encounter: Payer: Self-pay | Admitting: Family Medicine

## 2022-03-08 ENCOUNTER — Telehealth: Payer: Self-pay

## 2022-03-08 ENCOUNTER — Encounter: Payer: Self-pay | Admitting: Family

## 2022-03-08 DIAGNOSIS — E890 Postprocedural hypothyroidism: Secondary | ICD-10-CM

## 2022-03-08 MED ORDER — LEVOTHYROXINE SODIUM 125 MCG PO TABS
125.0000 ug | ORAL_TABLET | Freq: Every day | ORAL | 3 refills | Status: DC
  Filled 2022-03-08: qty 90, 90d supply, fill #0

## 2022-03-08 MED ORDER — WEGOVY 0.25 MG/0.5ML ~~LOC~~ SOAJ
0.2500 mg | SUBCUTANEOUS | 1 refills | Status: DC
Start: 2022-03-08 — End: 2022-04-26
  Filled 2022-03-08 – 2022-03-13 (×2): qty 2, 28d supply, fill #0

## 2022-03-08 NOTE — Telephone Encounter (Signed)
Message from Plan  Available without authorization.

## 2022-03-13 ENCOUNTER — Other Ambulatory Visit (HOSPITAL_BASED_OUTPATIENT_CLINIC_OR_DEPARTMENT_OTHER): Payer: Self-pay

## 2022-03-22 ENCOUNTER — Ambulatory Visit: Payer: Medicare HMO | Attending: Cardiology | Admitting: Cardiology

## 2022-03-22 ENCOUNTER — Other Ambulatory Visit (HOSPITAL_BASED_OUTPATIENT_CLINIC_OR_DEPARTMENT_OTHER): Payer: Self-pay

## 2022-03-22 ENCOUNTER — Encounter: Payer: Self-pay | Admitting: Cardiology

## 2022-03-22 VITALS — BP 136/90 | HR 73 | Ht 62.0 in | Wt 217.0 lb

## 2022-03-22 DIAGNOSIS — I10 Essential (primary) hypertension: Secondary | ICD-10-CM

## 2022-03-22 DIAGNOSIS — I471 Supraventricular tachycardia: Secondary | ICD-10-CM

## 2022-03-22 DIAGNOSIS — R0602 Shortness of breath: Secondary | ICD-10-CM | POA: Diagnosis not present

## 2022-03-22 DIAGNOSIS — I493 Ventricular premature depolarization: Secondary | ICD-10-CM | POA: Diagnosis not present

## 2022-03-22 MED ORDER — DILTIAZEM HCL ER COATED BEADS 180 MG PO CP24
180.0000 mg | ORAL_CAPSULE | Freq: Every day | ORAL | 3 refills | Status: DC
  Filled 2022-03-22: qty 90, 90d supply, fill #0
  Filled 2022-06-19: qty 90, 90d supply, fill #1
  Filled 2022-09-12: qty 90, 90d supply, fill #2
  Filled 2022-12-04: qty 90, 90d supply, fill #3

## 2022-03-22 NOTE — Patient Instructions (Addendum)
Medication Instructions:  Your physician has recommended you make the following change in your medication:  INCREASE: Cardizem CD '180mg'$  daily by mouth   Lab Work: None Ordered If you have labs (blood work) drawn today and your tests are completely normal, you will receive your results only by: [place] (if you have MyChart) OR A paper copy in the mail If you have any lab test that is abnormal or we need to change your treatment, we will call you to review the results.   Testing/Procedures: None Ordered   Follow-Up: At [facility], you and your health needs are our priority.  As part of our continuing mission to provide you with exceptional heart care, we have created designated Provider Care Teams.  These Care Teams include your primary Cardiologist (physician) and Advanced Practice Providers (APPs -  Physician Assistants and Nurse Practitioners) who all work together to provide you with the care you need, when you need it.  We recommend signing up for the patient portal called "MyChart".  Sign up information is provided on this After Visit Summary.  MyChart is used to connect with patients for Virtual Visits (Telemedicine).  Patients are able to view lab/test results, encounter notes, upcoming appointments, etc.  Non-urgent messages can be sent to your provider as well.   To learn more about what you can do with MyChart, go to [email or web address].    Your next appointment:   6 month(s)  The format for your next appointment:   In Person  Provider:   Jenne Campus, MD    Other Instructions NA

## 2022-03-22 NOTE — Addendum Note (Signed)
Addended by: Jacobo Forest D on: 03/22/2022 10:11 AM   Modules accepted: Orders

## 2022-03-22 NOTE — Progress Notes (Signed)
Cardiology Office Note:    Date:  03/22/2022   ID:  Karen Wall, DOB [birth year], MRN [id number]  PCP:  Darreld Mclean, MD  Cardiologist:  Jenne Campus, MD    Referring MD: Darreld Mclean, MD   Chief Complaint  Patient presents with   Hypertension    Some heart fluttering but not as bad as it was     History of Present Illness:    Karen Wall is a 66 y.o. female with past medical history significant for PVCs with burden of PVCs of 16.6, supraventricular tachycardia, essential hypertension, history of left partial nephrectomy secondary to cancer, put her initially on beta-blocker to help with palpitation which did great deal for palpitations but she felt lousy exhausted.  After that I will switch her to calcium channel blocker diltiazem long-acting seems to be helping but still not perfectly.  Overall she seems to be doing well.  Denies have any chest pain tightness squeezing pressure burning chest  Past Medical History:  Diagnosis Date   A-fib [facility])    history of   Acquired flat foot    Ankle pain    bilateral   Arthritis    feet ankles, and knees    Cancer of kidney parenchyma, right ([place]) 09/08/2018   Depression    Diverticulosis    Dyspnea    with exertion    Dysrhythmia    hx of afib with hyperthroid no issues currently    GERD (gastroesophageal reflux disease)    H/O hiatal hernia 08/09/2018   Moderate to Large   Hyperlipidemia    Hypertension    Hypothyroidism    Iron deficiency anemia    iron deficiency last iron infusion 08/2018   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   Numbness    Left side of face around mouth - unknown etiology   Numbness and tingling    left side - facial   Palpitations    Occurred during hyperthyroid   PONV (postoperative nausea and vomiting)    Right renal mass 07/2018   2.1 cm solid heterogeneously enhancing mass in posterior midpole    Sinus tachycardia by electrocardiogram 05/12/2018   Thrombocythemia     Thrombocytosis 11/22/2017   Thyroid cancer ([place])    Thyroid disease    hyperthyroidism   Vitamin B 12 deficiency    Vitamin D deficiency     Past Surgical History:  Procedure Laterality Date   BREAST BIOPSY     age 16 - negative   CHOLECYSTECTOMY  1992   OPERATIVE ULTRASOUND Right 09/25/2018   Procedure: OPERATIVE ULTRASOUND;  Surgeon: Raynelle Bring, MD;  [facility];  Service: Urology;  Laterality: Right;   ROBOTIC ASSITED PARTIAL NEPHRECTOMY Right 09/25/2018   Procedure: XI ROBOTIC RIGHT  ASSITED PARTIAL NEPHRECTOMY;  Surgeon: Raynelle Bring, MD;  [facility];  Service: Urology;  Laterality: Right;   THYROIDECTOMY N/A 07/03/2013   Procedure: TOTAL THYROIDECTOMY;  Surgeon: Earnstine Regal, MD;  Location: [facility];  Service: General;  Laterality: N/A;    Current Medications: Current Meds  Medication Sig   acetaminophen (TYLENOL) 500 MG tablet Take 500 mg by mouth every 6 (six) hours as needed for pain.   aspirin 81 MG EC tablet Take 81 mg by mouth daily. Swallow whole.   Cholecalciferol (VITAMIN D3) 1.25 MG (50000 UT) TABS Take 5,000 Units by mouth once a week.   CYANOCOBALAMIN SL Place 1 tablet under the tongue every other day.   diltiazem (CARTIA XT) 120  MG 24 hr capsule Take 1 capsule (120 mg total) by mouth daily.   FLUoxetine (PROZAC) 20 MG capsule ALTERNATE BETWEEN TAKING 1 CAPSULE BY MOUTH DAILY AND 2 CAPSULES BY MOUTH DAILY (Patient taking differently: Take 20 mg by mouth See admin instructions. 1-2 cap daily)   levothyroxine (SYNTHROID) 125 MCG tablet Take 1 tablet (125 mcg total) by mouth daily.   LORazepam (ATIVAN) 1 MG tablet Take 1 tablet (1 mg total) by mouth as needed for anxiety or sleep.   lovastatin (MEVACOR) 20 MG tablet TAKE 1 TABLET (20 MG TOTAL) BY MOUTH AT BEDTIME. (Patient taking differently: Take 20 mg by mouth at bedtime.)     Allergies:   Penicillins   Social History   Socioeconomic History   Marital status: Divorced    Spouse name: Not on file    Number of children: 1   Years of education: college   Highest education level: Associate degree: occupational, Hotel manager, or vocational program  Occupational History   Occupation: Therapist, sports  Tobacco Use   Smoking status: Never   Smokeless tobacco: Never  Vaping Use   Vaping Use: Never used  Substance and Sexual Activity   Alcohol use: No    Alcohol/week: 0.0 standard drinks of alcohol   Drug use: No   Sexual activity: Never  Other Topics Concern   Not on file  Social History Narrative   Lives at home alone.   Right-handed.   2 cups caffeine daily.   Social Determinants of Health   Financial Resource Strain: Low Risk  (06/06/2021)   Overall Financial Resource Strain (CARDIA)    Difficulty of Paying Living Expenses: Not hard at all  Food Insecurity: No Food Insecurity (06/06/2021)   Hunger Vital Sign    Worried About Running Out of Food in the Last Year: Never true    Ran Out of Food in the Last Year: Never true  Transportation Needs: No Transportation Needs (06/06/2021)   PRAPARE - Hydrologist (Medical): No    Lack of Transportation (Non-Medical): No  Physical Activity: Insufficiently Active (06/06/2021)   Exercise Vital Sign    Days of Exercise per Week: 4 days    Minutes of Exercise per Session: 30 min  Stress: No Stress Concern Present (06/06/2021)   [place]    Feeling of Stress : Only a little  Social Connections: Socially Isolated (06/06/2021)   Social Connection and Isolation Panel [NHANES]    Frequency of Communication with Friends and Family: Once a week    Frequency of Social Gatherings with Friends and Family: Once a week    Attends Religious Services: Never    Marine scientist or Organizations: No    Attends Music therapist: Never    Marital Status: Divorced     Family History: The patient's family history includes CAD in her father; COPD in  her mother; Congestive Heart Failure in her paternal grandmother; Diabetes in her father, maternal grandmother, and mother; Heart attack in her maternal grandfather and paternal grandfather; Hyperlipidemia in her father and mother; Hypertension in her father and mother; Mental illness in her father and maternal grandmother; Stroke in her maternal grandfather and maternal grandmother. ROS:   Please see the history of present illness.    All 14 point review of systems negative except as described per history of present illness  EKGs/Labs/Other Studies Reviewed:      Recent Labs: 11/24/2021: ALT 12;  Hemoglobin 13.5; Platelet Count 434 03/07/2022: BUN 12; Creatinine, Ser 0.70; Potassium 4.6; Sodium 138; TSH 16.21  Recent Lipid Panel    Component Value Date/Time   CHOL 188 09/07/2021 1137   TRIG 80.0 09/07/2021 1137   HDL 60.80 09/07/2021 1137   CHOLHDL 3 09/07/2021 1137   VLDL 16.0 09/07/2021 1137   LDLCALC 111 (H) 09/07/2021 1137   LDLCALC 123 (H) 09/02/2019 1511    Physical Exam:    VS:  BP (!) 136/90 (BP Location: Left Arm, Patient Position: Sitting)   Pulse 73   Ht '5\' 2"'$  (1.575 m)   Wt 217 lb (98.4 kg)   SpO2 95%   BMI 39.69 kg/m     Wt Readings from Last 3 Encounters:  03/22/22 217 lb (98.4 kg)  03/07/22 218 lb (98.9 kg)  12/07/21 213 lb (96.6 kg)     GEN:  Well nourished, well developed in no acute distress HEENT: Normal NECK: No JVD; No carotid bruits LYMPHATICS: No lymphadenopathy CARDIAC: RRR, no murmurs, no rubs, no gallops RESPIRATORY:  Clear to auscultation without rales, wheezing or rhonchi  ABDOMEN: Soft, non-tender, non-distended MUSCULOSKELETAL:  No edema; No deformity  SKIN: Warm and dry LOWER EXTREMITIES: no swelling NEUROLOGIC:  Alert and oriented x 3 PSYCHIATRIC:  Normal affect   ASSESSMENT:    1. Supraventricular tachycardia ([place])   2. Ventricular ectopy   3. Essential hypertension   4. Shortness of breath    PLAN:    In order of problems  listed above:  Supraventricular tachycardia.  I will increase dose of Cardizem to 180 mg daily. Ventricular ectopy plan is as described above Cardizem will go up.  That should control her ventricular ectopy more. Essential hypertension, still elevated the higher side.  I will increase dose of her Cardizem which should help Shortness of breath only mild. Dyslipidemia: We will continue present medications fasting lipid profile need to be rechecked after her thyroid will be stable.   Medication Adjustments/Labs and Tests Ordered: Current medicines are reviewed at length with the patient today.  Concerns regarding medicines are outlined above.  No orders of the defined types were placed in this encounter.  Medication changes: No orders of the defined types were placed in this encounter.   Signed, Park Liter, MD, [facility] 03/22/2022 9:59 AM    [place]

## 2022-03-28 ENCOUNTER — Telehealth (HOSPITAL_BASED_OUTPATIENT_CLINIC_OR_DEPARTMENT_OTHER): Payer: Self-pay

## 2022-04-10 DIAGNOSIS — Z1211 Encounter for screening for malignant neoplasm of colon: Secondary | ICD-10-CM | POA: Diagnosis not present

## 2022-04-17 ENCOUNTER — Encounter: Payer: Self-pay | Admitting: Family Medicine

## 2022-04-17 LAB — COLOGUARD: COLOGUARD: NEGATIVE

## 2022-04-26 ENCOUNTER — Encounter: Payer: Self-pay | Admitting: Medical Oncology

## 2022-04-26 ENCOUNTER — Inpatient Hospital Stay: Payer: Medicare HMO | Attending: Hematology & Oncology

## 2022-04-26 ENCOUNTER — Inpatient Hospital Stay: Payer: Medicare HMO | Admitting: Medical Oncology

## 2022-04-26 VITALS — BP 135/60 | HR 77 | Temp 98.6°F | Resp 18 | Wt 214.4 lb

## 2022-04-26 DIAGNOSIS — D500 Iron deficiency anemia secondary to blood loss (chronic): Secondary | ICD-10-CM | POA: Diagnosis not present

## 2022-04-26 DIAGNOSIS — D509 Iron deficiency anemia, unspecified: Secondary | ICD-10-CM | POA: Insufficient documentation

## 2022-04-26 DIAGNOSIS — C641 Malignant neoplasm of right kidney, except renal pelvis: Secondary | ICD-10-CM

## 2022-04-26 DIAGNOSIS — D75839 Thrombocytosis, unspecified: Secondary | ICD-10-CM

## 2022-04-26 DIAGNOSIS — Z85528 Personal history of other malignant neoplasm of kidney: Secondary | ICD-10-CM | POA: Insufficient documentation

## 2022-04-26 LAB — CBC WITH DIFFERENTIAL (CANCER CENTER ONLY)
Abs Immature Granulocytes: 0.03 10*3/uL (ref 0.00–0.07)
Basophils Absolute: 0.1 10*3/uL (ref 0.0–0.1)
Basophils Relative: 1 %
Eosinophils Absolute: 0.1 10*3/uL (ref 0.0–0.5)
Eosinophils Relative: 1 %
HCT: 41.8 % (ref 36.0–46.0)
Hemoglobin: 13.1 g/dL (ref 12.0–15.0)
Immature Granulocytes: 0 %
Lymphocytes Relative: 26 %
Lymphs Abs: 2.4 10*3/uL (ref 0.7–4.0)
MCH: 26.6 pg (ref 26.0–34.0)
MCHC: 31.3 g/dL (ref 30.0–36.0)
MCV: 84.8 fL (ref 80.0–100.0)
Monocytes Absolute: 0.9 10*3/uL (ref 0.1–1.0)
Monocytes Relative: 10 %
Neutro Abs: 5.7 10*3/uL (ref 1.7–7.7)
Neutrophils Relative %: 62 %
Platelet Count: 496 10*3/uL — ABNORMAL HIGH (ref 150–400)
RBC: 4.93 MIL/uL (ref 3.87–5.11)
RDW: 14.6 % (ref 11.5–15.5)
WBC Count: 9.2 10*3/uL (ref 4.0–10.5)
nRBC: 0 % (ref 0.0–0.2)

## 2022-04-26 LAB — CMP (CANCER CENTER ONLY)
ALT: 11 U/L (ref 0–44)
AST: 14 U/L — ABNORMAL LOW (ref 15–41)
Albumin: 4 g/dL (ref 3.5–5.0)
Alkaline Phosphatase: 56 U/L (ref 38–126)
Anion gap: 8 (ref 5–15)
BUN: 18 mg/dL (ref 8–23)
CO2: 29 mmol/L (ref 22–32)
Calcium: 9.6 mg/dL (ref 8.9–10.3)
Chloride: 103 mmol/L (ref 98–111)
Creatinine: 0.78 mg/dL (ref 0.44–1.00)
GFR, Estimated: >60 mL/min (ref >60)
Glucose, Bld: 101 mg/dL — ABNORMAL HIGH (ref 70–99)
Potassium: 4.7 mmol/L (ref 3.5–5.1)
Sodium: 140 mmol/L (ref 135–145)
Total Bilirubin: 0.3 mg/dL (ref 0.3–1.2)
Total Protein: 7.5 g/dL (ref 6.5–8.1)

## 2022-04-26 LAB — IRON AND IRON BINDING CAPACITY (CC-WL,HP ONLY)
Iron: 42 ug/dL (ref 28–170)
Saturation Ratios: 11 % (ref 10.4–31.8)
TIBC: 400 ug/dL (ref 250–450)
UIBC: 358 ug/dL (ref 148–442)

## 2022-04-26 LAB — SAVE SMEAR(SSMR), FOR PROVIDER SLIDE REVIEW

## 2022-04-26 LAB — LACTATE DEHYDROGENASE: LDH: 141 U/L (ref 98–192)

## 2022-04-26 LAB — FERRITIN: Ferritin: 32 ng/mL (ref 11–307)

## 2022-04-26 NOTE — Progress Notes (Signed)
Hematology and Oncology Follow Up Visit   NAYLA DIAS [id number] 30-Jun-1956 66 y.o. 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 Urquilla is in for follow-up.  We last saw her back in January.  Since then, she has been doing well.  She really has had no complaints.  Her son got married this summer after graduating from [employer]. She loves his wife- they both love animals. His wife was hospitalized for about a month in August for UC but is doing better now.   Her cardiologist switch her from metoprolol to diltiazem which she is tolerating better. Less problems with SOB and arrhythmia.   Her last iron studies back in January showed a ferritin of 319 with an iron saturation of 27% so she is not on hydrea. Continues follow up with Urology- next is scheduled in March.   She has had no change in bowel or bladder habits.  She has had no cough or shortness of breath.  There has been no nausea or vomiting.  Overall, her performance status right now is ECOG 1.     Medications:  Current Outpatient Medications:    acetaminophen (TYLENOL) 500 MG tablet, Take 500 mg by mouth every 6 (six) hours as needed for pain., Disp: , Rfl:    amLODipine-benazepril (LOTREL) 5-20 MG capsule, Take 1 capsule by mouth at bedtime., Disp: 90 capsule, Rfl: 1   LORazepam (ATIVAN) 1 MG tablet, Take 1 tablet (1 mg total) by mouth at bedtime as needed for anxiety or sleep., Disp: 30 tablet, Rfl: 0   lovastatin (MEVACOR) 20 MG tablet, Take 1 tablet (20 mg total) by mouth at bedtime., Disp: 90 tablet, Rfl: 3   thyroid (ARMOUR THYROID) 15 MG tablet, Take 1  tablet (15 mg total) by mouth daily. Along with the 60 mg tablet. (Patient taking differently: Take 15 mg by mouth daily before breakfast. Along with the 60 mg tablet.), Disp: 90 tablet, Rfl: 3   thyroid (ARMOUR THYROID) 60 MG tablet, Take every day along with the 15 mg tablet. (Patient taking differently: Take 60 mg by mouth daily before breakfast. Take every day along with the 15 mg tablet.), Disp: 90 tablet, Rfl: 3   traMADol (ULTRAM) 50 MG tablet, Take 1-2 tablets (50-100 mg total) by mouth every 6 (six) hours as needed for moderate pain or severe pain., Disp: 20 tablet, Rfl: 0   Allergies:       Allergies  Allergen Reactions   Penicillins Shortness Of Breath      Did it involve swelling of the face/tongue/throat, SOB, or low BP? Yes Did it involve sudden or severe rash/hives, skin peeling, or any reaction on the inside of your mouth or nose? No Did you need to seek medical attention at a hospital or doctor's office? Unknown When did it last happen?  64-29 years old     If all above answers are "NO",  may proceed with cephalosporin use.        Past Medical History, Surgical history, Social history, and Family History were reviewed and updated.   Review of Systems: Review of Systems  Constitutional: Negative.   HENT:  Negative.   Eyes: Negative.   Respiratory: Negative.   Cardiovascular: Negative.   Gastrointestinal: Negative.   Endocrine: Negative.   Genitourinary: Negative.    Musculoskeletal: Negative.   Skin: Negative.   Neurological: Negative.   Hematological: Negative.   Psychiatric/Behavioral: Negative.       Physical Exam: Vital signs this visit show temperature of 97.1.  Pulse is 78.  Blood pressure 100/66.  Weight is 209 pounds.      Wt Readings from Last 3 Encounters:  09/25/18 213 lb (96.6 kg)  09/23/18 213 lb (96.6 kg)  09/08/18 198 lb (89.8 kg)      Physical Exam Vitals signs reviewed.  HENT:     Head: Normocephalic and atraumatic.  Eyes:     Pupils:  Pupils are equal, round, and reactive to light.  Neck:     Musculoskeletal: Normal range of motion.  Cardiovascular:     Rate and Rhythm: Normal rate and regular rhythm.     Heart sounds: Normal heart sounds.  Pulmonary:     Effort: Pulmonary effort is normal.     Breath sounds: Normal breath sounds.  Abdominal:     General: Bowel sounds are normal.     Palpations: Abdomen is soft.  Musculoskeletal: Normal range of motion.        General: No tenderness or deformity.  Lymphadenopathy:     Cervical: No cervical adenopathy.  Skin:    General: Skin is warm and dry.     Findings: No erythema or rash.  Neurological:     Mental Status: She is alert and oriented to person, place, and time.  Psychiatric:        Behavior: Behavior normal.        Thought Content: Thought content normal.        Judgment: Judgment normal.          Recent Labs       Lab Results  Component Value Date    WBC 7.3 09/23/2018    HGB 12.0 09/26/2018    HCT 39.4 09/26/2018    MCV 87.6 09/23/2018    PLT 479 (H) 09/23/2018        Chemistry    Labs (Brief)          Component Value Date/Time    NA 137 09/26/2018 0339    K 4.0 09/26/2018 0339    CL 105 09/26/2018 0339    CO2 24 09/26/2018 0339    BUN 9 09/26/2018 0339    CREATININE 0.70 09/26/2018 0339    CREATININE 0.81 09/08/2018 1148    CREATININE 0.74 05/18/2015 1356      Labs (Brief)          Component Value Date/Time    CALCIUM 8.6 (L) 09/26/2018 0339    ALKPHOS 52 09/08/2018 1148    AST 13 (L) 09/08/2018 1148    ALT 13 09/08/2018 1148    BILITOT 0.2 (L) 09/08/2018 1148           Impression and Plan: Ms. Pedro is a 66 year old white female.    She has thrombocythemia.  She is on no medication for this right now. Platelet count up mildly though this may be related to iron deficiency- iron studies pending.   Continue  follow up with Urology. Reviewed her CMP today which looks good. She is doing well.   RTC 6  months  Disposition:  RTC 6 months MD, labs  Hughie Closs PA-C  6/11/202012:28 PM

## 2022-04-27 ENCOUNTER — Telehealth: Payer: Self-pay | Admitting: *Deleted

## 2022-04-27 ENCOUNTER — Other Ambulatory Visit: Payer: Self-pay

## 2022-04-27 NOTE — Telephone Encounter (Signed)
-----   Message from [name], MD sent at 04/26/2022  4:47 PM EDT ----- Please call and let her know that the iron level is okay.  Thanks.  Laurey Arrow

## 2022-04-27 NOTE — Telephone Encounter (Signed)
Called pt with results. Pt verbalized understanding.No concerns at this time.

## 2022-04-30 ENCOUNTER — Telehealth: Payer: Self-pay | Admitting: Family Medicine

## 2022-04-30 NOTE — Telephone Encounter (Signed)
Tsh already in (dated 03/08/22- Future)

## 2022-04-30 NOTE — Telephone Encounter (Signed)
Pt states she thought she was advised to get her thyroid checked. Did not see orders, please advise pt once orders have been entered.

## 2022-05-01 ENCOUNTER — Telehealth: Payer: Self-pay | Admitting: *Deleted

## 2022-05-01 NOTE — Telephone Encounter (Signed)
No 04/26/22 LOS

## 2022-05-03 ENCOUNTER — Encounter: Payer: Self-pay | Admitting: Family Medicine

## 2022-05-03 ENCOUNTER — Other Ambulatory Visit (INDEPENDENT_AMBULATORY_CARE_PROVIDER_SITE_OTHER): Payer: Medicare HMO

## 2022-05-03 ENCOUNTER — Ambulatory Visit (HOSPITAL_BASED_OUTPATIENT_CLINIC_OR_DEPARTMENT_OTHER)
Admission: RE | Admit: 2022-05-03 | Discharge: 2022-05-03 | Disposition: A | Discharge status: Home / Self Care | Payer: Medicare HMO | Source: Ambulatory Visit | Attending: Family Medicine | Admitting: Family Medicine

## 2022-05-03 DIAGNOSIS — Z780 Asymptomatic menopausal state: Secondary | ICD-10-CM | POA: Diagnosis not present

## 2022-05-03 DIAGNOSIS — E890 Postprocedural hypothyroidism: Secondary | ICD-10-CM

## 2022-05-03 DIAGNOSIS — E2839 Other primary ovarian failure: Secondary | ICD-10-CM | POA: Diagnosis not present

## 2022-05-04 ENCOUNTER — Encounter: Payer: Self-pay | Admitting: Family Medicine

## 2022-05-04 ENCOUNTER — Other Ambulatory Visit (HOSPITAL_BASED_OUTPATIENT_CLINIC_OR_DEPARTMENT_OTHER): Payer: Self-pay

## 2022-05-04 DIAGNOSIS — E890 Postprocedural hypothyroidism: Secondary | ICD-10-CM

## 2022-05-04 LAB — TSH: TSH: 0.04 u[IU]/mL — ABNORMAL LOW (ref 0.35–5.50)

## 2022-05-04 MED ORDER — LEVOTHYROXINE SODIUM 100 MCG PO TABS
100.0000 ug | ORAL_TABLET | Freq: Every day | ORAL | 6 refills | Status: DC
  Filled 2022-05-04: qty 30, 30d supply, fill #0
  Filled 2022-06-06: qty 30, 30d supply, fill #1
  Filled 2022-06-19 – 2022-06-22 (×4): qty 30, 30d supply, fill #2
  Filled 2022-08-23: qty 30, 30d supply, fill #3
  Filled 2022-11-07: qty 30, 30d supply, fill #4
  Filled 2022-12-03: qty 30, 30d supply, fill #5
  Filled 2023-01-16: qty 30, 30d supply, fill #6

## 2022-05-04 NOTE — Telephone Encounter (Signed)
Last tsh was elevated at 16, now suppressed  We had recently switched her from [place] Thyroid to levothyroxine-currently taking 125 We will scale back to levothyroxine 100 and recheck in 4 to 6 weeks  Results for orders placed or performed in visit on 05/03/22  TSH  Result Value Ref Range   TSH 0.04 Repeated and verified X2. (L) 0.35 - 5.50 uIU/mL

## 2022-05-07 ENCOUNTER — Other Ambulatory Visit (HOSPITAL_BASED_OUTPATIENT_CLINIC_OR_DEPARTMENT_OTHER): Payer: Self-pay

## 2022-05-08 ENCOUNTER — Other Ambulatory Visit (HOSPITAL_BASED_OUTPATIENT_CLINIC_OR_DEPARTMENT_OTHER): Payer: Self-pay

## 2022-05-11 DIAGNOSIS — H524 Presbyopia: Secondary | ICD-10-CM | POA: Diagnosis not present

## 2022-05-11 DIAGNOSIS — Z135 Encounter for screening for eye and ear disorders: Secondary | ICD-10-CM | POA: Diagnosis not present

## 2022-06-06 ENCOUNTER — Other Ambulatory Visit (HOSPITAL_BASED_OUTPATIENT_CLINIC_OR_DEPARTMENT_OTHER): Payer: Self-pay

## 2022-06-11 ENCOUNTER — Ambulatory Visit (INDEPENDENT_AMBULATORY_CARE_PROVIDER_SITE_OTHER): Payer: Medicare HMO | Admitting: *Deleted

## 2022-06-11 DIAGNOSIS — Z0000 Encounter for general adult medical examination without abnormal findings: Secondary | ICD-10-CM | POA: Diagnosis not present

## 2022-06-11 NOTE — Patient Instructions (Signed)
Karen Wall , Thank you for taking time to come for your Medicare Wellness Visit. I appreciate your ongoing commitment to your health goals. Please review the following plan we discussed and let me know if I can assist you in the future.   These are the goals we discussed:  Goals      Patient Stated     Would like to lose some weight        This is a list of the screening recommended for you and due dates:  Health Maintenance  Topic Date Due   Zoster (Shingles) Vaccine (1 of 2) Never done   Flu Shot  02/13/2022   COVID-19 Vaccine (4 - 2023-24 season) 03/16/2022   Mammogram  06/07/2023   Medicare Annual Wellness Visit  06/12/2023   Cologuard (Stool DNA test)  04/10/2025   Pneumonia Vaccine  Completed   DEXA scan (bone density measurement)  Completed   Hepatitis C Screening: USPSTF Recommendation to screen - Ages 38-79 yo.  Completed   HPV Vaccine  Aged Out     Next appointment: Follow up in one year for your annual wellness visit.   Preventive Care 71 Years and Older, Female Preventive care refers to lifestyle choices and visits with your health care provider that can promote health and wellness. What does preventive care include? A yearly physical exam. This is also called an annual well check. Dental exams once or twice a year. Routine eye exams. Ask your health care provider how often you should have your eyes checked. Personal lifestyle choices, including: Daily care of your teeth and gums. Regular physical activity. Eating a healthy diet. Avoiding tobacco and drug use. Limiting alcohol use. Practicing safe sex. Taking low-dose aspirin every day. Taking vitamin and mineral supplements as recommended by your health care provider. What happens during an annual well check? The services and screenings done by your health care provider during your annual well check will depend on your age, overall health, lifestyle risk factors, and family history of disease. Counseling   Your health care provider may ask you questions about your: Alcohol use. Tobacco use. Drug use. Emotional well-being. Home and relationship well-being. Sexual activity. Eating habits. History of falls. Memory and ability to understand (cognition). Work and work Statistician. Reproductive health. Screening  You may have the following tests or measurements: Height, weight, and BMI. Blood pressure. Lipid and cholesterol levels. These may be checked every 5 years, or more frequently if you are over 28 years old. Skin check. Lung cancer screening. You may have this screening every year starting at age 35 if you have a 30-pack-year history of smoking and currently smoke or have quit within the past 15 years. Fecal occult blood test (FOBT) of the stool. You may have this test every year starting at age 73. Flexible sigmoidoscopy or colonoscopy. You may have a sigmoidoscopy every 5 years or a colonoscopy every 10 years starting at age 7. Hepatitis C blood test. Hepatitis B blood test. Sexually transmitted disease (STD) testing. Diabetes screening. This is done by checking your blood sugar (glucose) after you have not eaten for a while (fasting). You may have this done every 1-3 years. Bone density scan. This is done to screen for osteoporosis. You may have this done starting at age 47. Mammogram. This may be done every 1-2 years. Talk to your health care provider about how often you should have regular mammograms. Talk with your health care provider about your test results, treatment options, and if  necessary, the need for more tests. Vaccines  Your health care provider may recommend certain vaccines, such as: Influenza vaccine. This is recommended every year. Tetanus, diphtheria, and acellular pertussis (Tdap, Td) vaccine. You may need a Td booster every 10 years. Zoster vaccine. You may need this after age 70. Pneumococcal 13-valent conjugate (PCV13) vaccine. One dose is recommended  after age 31. Pneumococcal polysaccharide (PPSV23) vaccine. One dose is recommended after age 67. Talk to your health care provider about which screenings and vaccines you need and how often you need them. This information is not intended to replace advice given to you by your health care provider. Make sure you discuss any questions you have with your health care provider. Document Released: 07/29/2015 Document Revised: 03/21/2016 Document Reviewed: 05/03/2015 Elsevier Interactive Patient Education  2017 [place] Prevention in the Home Falls can cause injuries. They can happen to people of all ages. There are many things you can do to make your home safe and to help prevent falls. What can I do on the outside of my home? Regularly fix the edges of walkways and driveways and fix any cracks. Remove anything that might make you trip as you walk through a door, such as a raised step or threshold. Trim any bushes or trees on the path to your home. Use bright outdoor lighting. Clear any walking paths of anything that might make someone trip, such as rocks or tools. Regularly check to see if handrails are loose or broken. Make sure that both sides of any steps have handrails. Any raised decks and porches should have guardrails on the edges. Have any leaves, snow, or ice cleared regularly. Use sand or salt on walking paths during winter. Clean up any spills in your garage right away. This includes oil or grease spills. What can I do in the bathroom? Use night lights. Install grab bars by the toilet and in the tub and shower. Do not use towel bars as grab bars. Use non-skid mats or decals in the tub or shower. If you need to sit down in the shower, use a plastic, non-slip stool. Keep the floor dry. Clean up any water that spills on the floor as soon as it happens. Remove soap buildup in the tub or shower regularly. Attach bath mats securely with double-sided non-slip rug tape. Do not  have throw rugs and other things on the floor that can make you trip. What can I do in the bedroom? Use night lights. Make sure that you have a light by your bed that is easy to reach. Do not use any sheets or blankets that are too big for your bed. They should not hang down onto the floor. Have a firm chair that has side arms. You can use this for support while you get dressed. Do not have throw rugs and other things on the floor that can make you trip. What can I do in the kitchen? Clean up any spills right away. Avoid walking on wet floors. Keep items that you use a lot in easy-to-reach places. If you need to reach something above you, use a strong step stool that has a grab bar. Keep electrical cords out of the way. Do not use floor polish or wax that makes floors slippery. If you must use wax, use non-skid floor wax. Do not have throw rugs and other things on the floor that can make you trip. What can I do with my stairs? Do not leave any items  on the stairs. Make sure that there are handrails on both sides of the stairs and use them. Fix handrails that are broken or loose. Make sure that handrails are as long as the stairways. Check any carpeting to make sure that it is firmly attached to the stairs. Fix any carpet that is loose or worn. Avoid having throw rugs at the top or bottom of the stairs. If you do have throw rugs, attach them to the floor with carpet tape. Make sure that you have a light switch at the top of the stairs and the bottom of the stairs. If you do not have them, ask someone to add them for you. What else can I do to help prevent falls? Wear shoes that: Do not have high heels. Have rubber bottoms. Are comfortable and fit you well. Are closed at the toe. Do not wear sandals. If you use a stepladder: Make sure that it is fully opened. Do not climb a closed stepladder. Make sure that both sides of the stepladder are locked into place. Ask someone to hold it for  you, if possible. Clearly mark and make sure that you can see: Any grab bars or handrails. First and last steps. Where the edge of each step is. Use tools that help you move around (mobility aids) if they are needed. These include: Canes. Walkers. Scooters. Crutches. Turn on the lights when you go into a dark area. Replace any light bulbs as soon as they burn out. Set up your furniture so you have a clear path. Avoid moving your furniture around. If any of your floors are uneven, fix them. If there are any pets around you, be aware of where they are. Review your medicines with your doctor. Some medicines can make you feel dizzy. This can increase your chance of falling. Ask your doctor what other things that you can do to help prevent falls. This information is not intended to replace advice given to you by your health care provider. Make sure you discuss any questions you have with your health care provider. Document Released: 04/28/2009 Document Revised: 12/08/2015 Document Reviewed: 08/06/2014 Elsevier Interactive Patient Education  2017 [employer].

## 2022-06-11 NOTE — Progress Notes (Signed)
Subjective:   Karen Wall is a 66 y.o. female who presents for Medicare Annual (Subsequent) preventive examination.  I connected with  Karen Wall on 06/11/22 by a audio enabled telemedicine application and verified that I am speaking with the correct person using two identifiers.  Patient Location: Home  Provider Location: Office/Clinic  I discussed the limitations of evaluation and management by telemedicine. The patient expressed understanding and agreed to proceed.   Review of Systems    Defer to PCP Cardiac Risk Factors include: advanced age (>49mn, >>64women);hypertension;dyslipidemia     Objective:    There were no vitals filed for this visit. There is no height or weight on file to calculate BMI.     06/11/2022   11:02 AM 04/26/2022   11:09 AM 11/24/2021   10:35 AM 07/25/2021   11:44 AM 06/06/2021    1:04 PM 02/22/2021   11:48 AM 10/27/2020   10:29 AM  Advanced Directives  Does Patient Have a Medical Advance Directive? No No No No No No No  Would patient like information on creating a medical advance directive? No - Patient declined No - Patient declined No - Patient declined No - Patient declined No - Patient declined No - Patient declined No - Patient declined    Current Medications (verified) Outpatient Encounter Medications as of 06/11/2022  Medication Sig   acetaminophen (TYLENOL) 500 MG tablet Take 500 mg by mouth every 6 (six) hours as needed for pain.   aspirin 81 MG EC tablet Take 81 mg by mouth daily. Swallow whole.   Cholecalciferol (VITAMIN D3) 1.25 MG (50000 UT) TABS Take 5,000 Units by mouth once a week.   CYANOCOBALAMIN SL Place 1 tablet under the tongue every other day.   diltiazem (CARDIZEM CD) 180 MG 24 hr capsule Take 1 capsule (180 mg total) by mouth daily.   FLUoxetine (PROZAC) 20 MG capsule ALTERNATE BETWEEN TAKING 1 CAPSULE BY MOUTH DAILY AND 2 CAPSULES BY MOUTH DAILY (Patient taking differently: Take 20 mg by mouth See admin  instructions. 1-2 cap daily)   hydroxyurea (HYDREA) 500 MG capsule Take 1 capsule (500 mg total) by mouth daily. May take with food to minimize GI side effects. (Patient not taking: Reported on 03/22/2022)   levothyroxine (SYNTHROID) 100 MCG tablet Take 1 tablet (100 mcg total) by mouth daily.   LORazepam (ATIVAN) 1 MG tablet Take 1 tablet (1 mg total) by mouth as needed for anxiety or sleep.   lovastatin (MEVACOR) 20 MG tablet TAKE 1 TABLET (20 MG TOTAL) BY MOUTH AT BEDTIME. (Patient taking differently: Take 20 mg by mouth at bedtime.)   [DISCONTINUED] metoprolol tartrate (LOPRESSOR) 25 MG tablet Take 1 tablet (25 mg total) by mouth 2 (two) times daily.   No facility-administered encounter medications on file as of 06/11/2022.    Allergies (verified) Penicillins   History: Past Medical History:  Diagnosis Date   A-fib ([facility]    history of   Acquired flat foot    Ankle pain    bilateral   Arthritis    feet ankles, and knees    [place], [place] 09[place]   Depression    Diverticulosis    Dyspnea    with exertion    Dysrhythmia    hx of afib with hyperthroid no issues currently    GERD (gastroesophageal reflux disease)    H/O hiatal hernia 08/09/2018   Moderate to Large   Hyperlipidemia    Hypertension  Hypothyroidism    Iron deficiency anemia    iron deficiency last iron infusion 08/2018   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   Numbness    Left side of face around mouth - unknown etiology   Numbness and tingling    left side - facial   Palpitations    Occurred during hyperthyroid   PONV (postoperative nausea and vomiting)    Right renal mass 07/2018   2.1 cm solid heterogeneously enhancing mass in posterior midpole    Sinus tachycardia by electrocardiogram 05/12/2018   Thrombocythemia    Thrombocytosis 11/22/2017   Thyroid cancer ([place])    Thyroid disease    hyperthyroidism   Vitamin B 12 deficiency    Vitamin D deficiency    Past Surgical History:   Procedure Laterality Date   BREAST BIOPSY     age 11 - negative   [place] Right 09/25/2018   Procedure: OPERATIVE ULTRASOUND;  Surgeon: Raynelle Bring, MD;  Location: [facility];  Service: Urology;  Laterality: Right;   ROBOTIC ASSITED PARTIAL NEPHRECTOMY Right 09/25/2018   Procedure: XI ROBOTIC RIGHT  ASSITED PARTIAL NEPHRECTOMY;  Surgeon: Raynelle Bring, MD;  Location: [facility];  Service: Urology;  Laterality: Right;   THYROIDECTOMY N/A 07/03/2013   Procedure: TOTAL THYROIDECTOMY;  Surgeon: Earnstine Regal, MD;  Location: [facility];  Service: General;  Laterality: N/A;   Family History  Problem Relation Age of Onset   Hypertension Mother    Diabetes Mother    Hyperlipidemia Mother    COPD Mother    CAD Father        MI in his 18s   Diabetes Father    Hyperlipidemia Father    Hypertension Father    Mental illness Father    Mental illness Maternal Grandmother    Stroke Maternal Grandmother    Diabetes Maternal Grandmother    Stroke Maternal Grandfather    Heart attack Maternal Grandfather    Congestive Heart Failure Paternal Grandmother    Heart attack Paternal Grandfather    Social History   Socioeconomic History   Marital status: Divorced    Spouse name: Not on file   Number of children: 1   Years of education: college   Highest education level: Associate degree: occupational, Hotel manager, or vocational program  Occupational History   Occupation: Therapist, sports  Tobacco Use   Smoking status: Never   Smokeless tobacco: Never  Vaping Use   Vaping Use: Never used  Substance and Sexual Activity   Alcohol use: No    Alcohol/week: 0.0 standard drinks of alcohol   Drug use: No   Sexual activity: Never  Other Topics Concern   Not on file  Social History Narrative   Lives at home alone.   Right-handed.   2 cups caffeine daily.   Social Determinants of Health   Financial Resource Strain: Low Risk  (06/06/2021)   Overall Financial Resource Strain  (CARDIA)    Difficulty of Paying Living Expenses: Not hard at all  Food Insecurity: No Food Insecurity (06/11/2022)   Hunger Vital Sign    Worried About Running Out of Food in the Last Year: Never true    Ran Out of Food in the Last Year: Never true  Transportation Needs: No Transportation Needs (06/11/2022)   PRAPARE - Hydrologist (Medical): No    Lack of Transportation (Non-Medical): No  Physical Activity: Insufficiently Active (06/06/2021)   Exercise Vital Sign    Days of  Exercise per Week: 4 days    Minutes of Exercise per Session: 30 min  Stress: No Stress Concern Present (06/06/2021)   [place]    Feeling of Stress : Only a little  Social Connections: Socially Isolated (06/06/2021)   Social Connection and Isolation Panel [NHANES]    Frequency of Communication with Friends and Family: Once a week    Frequency of Social Gatherings with Friends and Family: Once a week    Attends Religious Services: Never    Marine scientist or Organizations: No    Attends Music therapist: Never    Marital Status: Divorced    Tobacco Counseling Counseling given: Not Answered   Clinical Intake:  Pre-visit preparation completed: Yes  Pain : No/denies pain  Diabetes: No  How often do you need to have someone help you when you read instructions, pamphlets, or other written materials from your doctor or pharmacy?: 1 - Never   Activities of Daily Living    06/11/2022   11:07 AM  In your present state of health, do you have any difficulty performing the following activities:  Hearing? 0  Vision? 0  Difficulty concentrating or making decisions? 1  Walking or climbing stairs? 1  Comment ankle and foot issues  Dressing or bathing? 0  Doing errands, shopping? 0  Preparing Food and eating ? N  Using the Toilet? N  In the past six months, have you accidently leaked urine? Y   Do you have problems with loss of bowel control? N  Managing your Medications? N  Managing your Finances? N  Housekeeping or managing your Housekeeping? N    Patient Care Team: Copland, Gay Filler, MD as PCP - General (Family Medicine)  Indicate any recent Medical Services you may have received from other than [facility] providers in the past year (date may be approximate).     Assessment:   This is a routine wellness examination for Karen Wall.  Hearing/Vision screen No results found.  Dietary issues and exercise activities discussed: Current Exercise Habits: Structured exercise class (Silver Sneakers), Type of exercise: Other - see comments (chair yoga), Time (Minutes): 30, Frequency (Times/Week): 4, Weekly Exercise (Minutes/Week): 120, Intensity: Mild, Exercise limited by: orthopedic condition(s)   Goals Addressed   None    Depression Screen    06/11/2022   11:03 AM 03/07/2022   10:51 AM 09/07/2021   11:15 AM 06/06/2021    1:08 PM 03/03/2021    3:29 PM 02/29/2020   10:43 AM 05/18/2015   12:55 PM  PHQ 2/9 Scores  PHQ - 2 Score '3 3 3 1 2 5 '$ 0  PHQ- 9 Score '9 3 15   17     '$ Fall Risk    06/11/2022   11:07 AM 03/07/2022   10:50 AM 09/07/2021   10:47 AM 06/06/2021    1:05 PM 03/03/2021    3:29 PM  Fall Risk   Falls in the past year? 1 0 0 1 0  Number falls in past yr: 0 0 0 1   Injury with Fall? 0 0 0 0   Risk for fall due to : No Fall Risks   History of fall(s)   Follow up Falls evaluation completed   Falls prevention discussed     FALL RISK PREVENTION PERTAINING TO THE HOME:  Any stairs in or around the home? Yes  If so, are there any without handrails? No  Home free of loose throw  rugs in walkways, pet beds, electrical cords, etc? Yes  Adequate lighting in your home to reduce risk of falls? Yes   ASSISTIVE DEVICES UTILIZED TO PREVENT FALLS:  Life alert? No  Use of a cane, walker or w/c? No  Grab bars in the bathroom? No  Shower chair or bench in shower? Yes   Elevated toilet seat or a handicapped toilet? No   TIMED UP AND GO:  Was the test performed?  No, audio visit .    Cognitive Function:        06/11/2022   11:12 AM  6CIT Screen  What Year? 0 points  What month? 0 points  What time? 0 points  Count back from 20 0 points  Months in reverse 0 points  Repeat phrase 0 points  Total Score 0 points    Immunizations Immunization History  Administered Date(s) Administered   Influenza,inj,Quad PF,6+ Mos 04/19/2014, 04/15/2016, 05/12/2018, 04/16/2019   Influenza-Unspecified 04/15/2013, 03/17/2015, 04/24/2017, 05/05/2020   PFIZER(Purple Top)SARS-COV-2 Vaccination 09/23/2019, 10/13/2019, 05/05/2020   PNEUMOCOCCAL CONJUGATE-20 03/01/2021   Tdap 09/01/2018    TDAP status: Up to date  Flu Vaccine status: Due, Education has been provided regarding the importance of this vaccine. Advised may receive this vaccine at local pharmacy or Health Dept. Aware to provide a copy of the vaccination record if obtained from local pharmacy or Health Dept. Verbalized acceptance and understanding.  Pneumococcal vaccine status: Up to date  Covid-19 vaccine status: Information provided on how to obtain vaccines.   Qualifies for Shingles Vaccine? Yes   Zostavax completed No   Shingrix Completed?: No.    Education has been provided regarding the importance of this vaccine. Patient has been advised to call insurance company to determine out of pocket expense if they have not yet received this vaccine. Advised may also receive vaccine at local pharmacy or Health Dept. Verbalized acceptance and understanding.  Screening Tests Health Maintenance  Topic Date Due   Zoster Vaccines- Shingrix (1 of 2) Never done   INFLUENZA VACCINE  02/13/2022   COVID-19 Vaccine (4 - 2023-24 season) 03/16/2022   Medicare Annual Wellness (AWV)  06/06/2022   MAMMOGRAM  06/07/2023   Fecal DNA (Cologuard)  04/10/2025   Pneumonia Vaccine 32+ Years old  Completed   DEXA SCAN   Completed   Hepatitis C Screening  Completed   HPV VACCINES  Aged Out    Health Maintenance  Health Maintenance Due  Topic Date Due   Zoster Vaccines- Shingrix (1 of 2) Never done   INFLUENZA VACCINE  02/13/2022   COVID-19 Vaccine (4 - 2023-24 season) 03/16/2022   Medicare Annual Wellness (AWV)  06/06/2022    Colorectal cancer screening: Type of screening: Cologuard. Completed 04/10/22. Repeat every 3 years  Mammogram status: Completed 06/06/21. Repeat every year  Bone Density status: Completed 05/03/22. Results reflect: Bone density results: NORMAL. Repeat every 2 years.  Lung Cancer Screening: (Low Dose CT Chest recommended if Age 92-80 years, 30 pack-year currently smoking OR have quit w/in 15years.) does not qualify.   Additional Screening:  Hepatitis C Screening: does qualify; Completed 05/18/15  Vision Screening: Recommended annual ophthalmology exams for early detection of glaucoma and other disorders of the eye. Is the patient up to date with their annual eye exam?  No  Who is the provider or what is the name of the office in which the patient attends annual eye exams? [facility] - Dr. Mendel Ryder If pt is not established with a provider, would they like  to be referred to a provider to establish care? No .   Dental Screening: Recommended annual dental exams for proper oral hygiene  Community Resource Referral / Chronic Care Management: CRR required this visit?  No   CCM required this visit?  No      Plan:     I have personally reviewed and noted the following in the patient's chart:   Medical and social history Use of alcohol, tobacco or illicit drugs  Current medications and supplements including opioid prescriptions. Patient is not currently taking opioid prescriptions. Functional ability and status Nutritional status Physical activity Advanced directives List of other physicians Hospitalizations, surgeries, and ER visits in previous 12  months Vitals Screenings to include cognitive, depression, and falls Referrals and appointments  In addition, I have reviewed and discussed with patient certain preventive protocols, quality metrics, and best practice recommendations. A written personalized care plan for preventive services as well as general preventive health recommendations were provided to patient.   Due to this being a telephonic visit, the after visit summary with patients personalized plan was offered to patient via mail or my-chart. Patient would like to access on my-chart.  Beatris Ship, [place]   06/11/2022   Nurse Notes: None

## 2022-06-19 ENCOUNTER — Other Ambulatory Visit: Payer: Self-pay | Admitting: Family Medicine

## 2022-06-19 ENCOUNTER — Other Ambulatory Visit (HOSPITAL_BASED_OUTPATIENT_CLINIC_OR_DEPARTMENT_OTHER): Payer: Self-pay

## 2022-06-19 MED ORDER — LORAZEPAM 1 MG PO TABS
1.0000 mg | ORAL_TABLET | ORAL | 0 refills | Status: DC | PRN
  Filled 2022-06-19: qty 30, 30d supply, fill #0

## 2022-06-20 ENCOUNTER — Other Ambulatory Visit (INDEPENDENT_AMBULATORY_CARE_PROVIDER_SITE_OTHER): Payer: Medicare HMO

## 2022-06-20 ENCOUNTER — Other Ambulatory Visit (HOSPITAL_BASED_OUTPATIENT_CLINIC_OR_DEPARTMENT_OTHER): Payer: Self-pay

## 2022-06-20 DIAGNOSIS — E890 Postprocedural hypothyroidism: Secondary | ICD-10-CM

## 2022-06-20 LAB — TSH: TSH: 0.3 u[IU]/mL — ABNORMAL LOW (ref 0.35–5.50)

## 2022-06-21 ENCOUNTER — Other Ambulatory Visit (HOSPITAL_BASED_OUTPATIENT_CLINIC_OR_DEPARTMENT_OTHER): Payer: Self-pay

## 2022-06-21 ENCOUNTER — Encounter: Payer: Self-pay | Admitting: Family Medicine

## 2022-06-21 DIAGNOSIS — E890 Postprocedural hypothyroidism: Secondary | ICD-10-CM

## 2022-06-21 MED ORDER — LEVOTHYROXINE SODIUM 88 MCG PO TABS
ORAL_TABLET | ORAL | 3 refills | Status: DC
  Filled 2022-06-21: qty 45, 90d supply, fill #0
  Filled 2022-09-12: qty 45, 90d supply, fill #1
  Filled 2022-12-04: qty 45, 90d supply, fill #2
  Filled 2023-02-19 (×3): qty 45, 90d supply, fill #3

## 2022-06-22 ENCOUNTER — Other Ambulatory Visit (HOSPITAL_BASED_OUTPATIENT_CLINIC_OR_DEPARTMENT_OTHER): Payer: Self-pay

## 2022-06-22 ENCOUNTER — Other Ambulatory Visit: Payer: Self-pay

## 2022-06-25 ENCOUNTER — Other Ambulatory Visit (HOSPITAL_BASED_OUTPATIENT_CLINIC_OR_DEPARTMENT_OTHER): Payer: Self-pay

## 2022-06-26 ENCOUNTER — Other Ambulatory Visit (HOSPITAL_BASED_OUTPATIENT_CLINIC_OR_DEPARTMENT_OTHER): Payer: Self-pay

## 2022-06-27 ENCOUNTER — Other Ambulatory Visit (HOSPITAL_BASED_OUTPATIENT_CLINIC_OR_DEPARTMENT_OTHER): Payer: Self-pay

## 2022-06-28 ENCOUNTER — Other Ambulatory Visit (HOSPITAL_BASED_OUTPATIENT_CLINIC_OR_DEPARTMENT_OTHER): Payer: Self-pay

## 2022-06-28 ENCOUNTER — Encounter: Payer: Self-pay | Admitting: Family

## 2022-06-29 ENCOUNTER — Other Ambulatory Visit (HOSPITAL_BASED_OUTPATIENT_CLINIC_OR_DEPARTMENT_OTHER): Payer: Self-pay

## 2022-06-30 ENCOUNTER — Encounter: Payer: Self-pay | Admitting: Family

## 2022-08-02 ENCOUNTER — Other Ambulatory Visit (INDEPENDENT_AMBULATORY_CARE_PROVIDER_SITE_OTHER): Payer: Medicare HMO

## 2022-08-02 ENCOUNTER — Encounter: Payer: Self-pay | Admitting: Family Medicine

## 2022-08-02 DIAGNOSIS — E890 Postprocedural hypothyroidism: Secondary | ICD-10-CM | POA: Diagnosis not present

## 2022-08-02 LAB — TSH: TSH: 1.42 u[IU]/mL (ref 0.35–5.50)

## 2022-08-31 ENCOUNTER — Other Ambulatory Visit (HOSPITAL_BASED_OUTPATIENT_CLINIC_OR_DEPARTMENT_OTHER): Payer: Self-pay

## 2022-09-10 ENCOUNTER — Other Ambulatory Visit (HOSPITAL_BASED_OUTPATIENT_CLINIC_OR_DEPARTMENT_OTHER): Payer: Self-pay | Admitting: Urology

## 2022-09-10 DIAGNOSIS — Z85528 Personal history of other malignant neoplasm of kidney: Secondary | ICD-10-CM

## 2022-09-12 ENCOUNTER — Other Ambulatory Visit: Payer: Self-pay

## 2022-09-15 ENCOUNTER — Ambulatory Visit (HOSPITAL_BASED_OUTPATIENT_CLINIC_OR_DEPARTMENT_OTHER)
Admission: RE | Admit: 2022-09-15 | Discharge: 2022-09-15 | Disposition: A | Discharge status: Home / Self Care | Payer: Medicare HMO | Source: Ambulatory Visit | Attending: Urology | Admitting: Urology

## 2022-09-15 DIAGNOSIS — K449 Diaphragmatic hernia without obstruction or gangrene: Secondary | ICD-10-CM | POA: Diagnosis not present

## 2022-09-15 DIAGNOSIS — Z85528 Personal history of other malignant neoplasm of kidney: Secondary | ICD-10-CM

## 2022-09-19 DIAGNOSIS — Z85528 Personal history of other malignant neoplasm of kidney: Secondary | ICD-10-CM | POA: Diagnosis not present

## 2022-09-19 DIAGNOSIS — C641 Malignant neoplasm of right kidney, except renal pelvis: Secondary | ICD-10-CM | POA: Diagnosis not present

## 2022-09-25 ENCOUNTER — Ambulatory Visit: Payer: Medicare HMO | Attending: Cardiology | Admitting: Cardiology

## 2022-09-25 ENCOUNTER — Other Ambulatory Visit (HOSPITAL_BASED_OUTPATIENT_CLINIC_OR_DEPARTMENT_OTHER): Payer: Self-pay

## 2022-09-25 ENCOUNTER — Encounter: Payer: Self-pay | Admitting: Cardiology

## 2022-09-25 VITALS — BP 138/90 | HR 82 | Ht 62.0 in | Wt 214.0 lb

## 2022-09-25 DIAGNOSIS — I2510 Atherosclerotic heart disease of native coronary artery without angina pectoris: Secondary | ICD-10-CM

## 2022-09-25 DIAGNOSIS — I493 Ventricular premature depolarization: Secondary | ICD-10-CM | POA: Diagnosis not present

## 2022-09-25 DIAGNOSIS — E785 Hyperlipidemia, unspecified: Secondary | ICD-10-CM

## 2022-09-25 DIAGNOSIS — I471 Supraventricular tachycardia, unspecified: Secondary | ICD-10-CM | POA: Diagnosis not present

## 2022-09-25 DIAGNOSIS — R0602 Shortness of breath: Secondary | ICD-10-CM

## 2022-09-25 DIAGNOSIS — I2584 Coronary atherosclerosis due to calcified coronary lesion: Secondary | ICD-10-CM

## 2022-09-25 DIAGNOSIS — I10 Essential (primary) hypertension: Secondary | ICD-10-CM | POA: Diagnosis not present

## 2022-09-25 HISTORY — DX: Atherosclerotic heart disease of native coronary artery without angina pectoris: I25.10

## 2022-09-25 MED ORDER — METOPROLOL TARTRATE 100 MG PO TABS
ORAL_TABLET | ORAL | 0 refills | Status: DC
  Filled 2022-09-25 – 2022-10-03 (×2): qty 1, 1d supply, fill #0

## 2022-09-25 NOTE — Progress Notes (Signed)
Cardiology Office Note:    Date:  09/25/2022   ID:  Karen Wall, DOB 1956/01/16, MRN [id number]  PCP:  Darreld Mclean, MD  Cardiologist:  Jenne Campus, MD    Referring MD: Darreld Mclean, MD   No chief complaint on file. I am short of breath  History of Present Illness:    Karen Wall is a 67 y.o. female past medical history significant for supraventricular tachycardia, frequent ventricular ectopy with a burden of 16.6% last year, history of nephrectomy secondary to cancer recently she had CT of her chest done to rule out any metastatic disease she was noted to have calcification of the LAD as well as circumflex.  She complain of having some shortness of breath denies have any typical chest pain tightness squeezing pressure mid chest just shortness of breath with exertion.  Palpitation does have some but does not bother her much.  No dizziness no passing out  Past Medical History:  Diagnosis Date   A-fib [facility])    history of   Acquired flat foot    Ankle pain    bilateral   Arthritis    feet ankles, and knees    Cancer of kidney parenchyma, right ([place]) 09/08/2018   Depression    Diverticulosis    Dyspnea    with exertion    Dysrhythmia    hx of afib with hyperthroid no issues currently    GERD (gastroesophageal reflux disease)    H/O hiatal hernia 08/09/2018   Moderate to Large   Hyperlipidemia    Hypertension    Hypothyroidism    Iron deficiency anemia    iron deficiency last iron infusion 08/2018   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   Numbness    Left side of face around mouth - unknown etiology   Numbness and tingling    left side - facial   Palpitations    Occurred during hyperthyroid   PONV (postoperative nausea and vomiting)    Right renal mass 07/2018   2.1 cm solid heterogeneously enhancing mass in posterior midpole    Sinus tachycardia by electrocardiogram 05/12/2018   Thrombocythemia    Thrombocytosis 11/22/2017   Thyroid cancer ([place])     Thyroid disease    hyperthyroidism   Vitamin B 12 deficiency    Vitamin D deficiency     Past Surgical History:  Procedure Laterality Date   BREAST BIOPSY     age 43 - negative   CHOLECYSTECTOMY  1992   OPERATIVE ULTRASOUND Right 09/25/2018   Procedure: OPERATIVE ULTRASOUND;  Surgeon: Raynelle Bring, MD;  Location: [facility];  Service: Urology;  Laterality: Right;   ROBOTIC ASSITED PARTIAL NEPHRECTOMY Right 09/25/2018   Procedure: XI ROBOTIC RIGHT  ASSITED PARTIAL NEPHRECTOMY;  Surgeon: Raynelle Bring, MD;  Location: [facility];  Service: Urology;  Laterality: Right;   THYROIDECTOMY N/A 07/03/2013   Procedure: TOTAL THYROIDECTOMY;  Surgeon: Earnstine Regal, MD;  Location: [facility];  Service: General;  Laterality: N/A;    Current Medications: Current Meds  Medication Sig   acetaminophen (TYLENOL) 500 MG tablet Take 500 mg by mouth every 6 (six) hours as needed for pain.   aspirin 81 MG EC tablet Take 81 mg by mouth daily. Swallow whole.   Cholecalciferol 125 MCG (5000 UT) capsule Take 5,000 Units by mouth once a week.   Cyanocobalamin 2500 MCG SUBL Place 1 tablet under the tongue every other day.   diltiazem (CARDIZEM CD) 180 MG 24 hr capsule Take 1 capsule (  180 mg total) by mouth daily.   FLUoxetine (PROZAC) 20 MG capsule ALTERNATE BETWEEN TAKING 1 CAPSULE BY MOUTH DAILY AND 2 CAPSULES BY MOUTH DAILY (Patient taking differently: Take 20 mg by mouth See admin instructions. 1-2 cap daily)   levothyroxine (SYNTHROID) 100 MCG tablet Take 1 tablet (100 mcg total) by mouth daily.   levothyroxine (SYNTHROID) 88 MCG tablet Take 1 tablet (51mg) every other day. Alternate with the 108m dose.   LORazepam (ATIVAN) 1 MG tablet Take 1 tablet (1 mg total) by mouth as needed for anxiety or sleep.   lovastatin (MEVACOR) 20 MG tablet TAKE 1 TABLET (20 MG TOTAL) BY MOUTH AT BEDTIME. (Patient taking differently: Take 20 mg by mouth at bedtime.)     Allergies:   Penicillins   Social History   Socioeconomic  History   Marital status: Divorced    Spouse name: Not on file   Number of children: 1   Years of education: college   Highest education level: Associate degree: occupational, teHotel manageror vocational program  Occupational History   Occupation: RNTherapist, sportsTobacco Use   Smoking status: Never   Smokeless tobacco: Never  Vaping Use   Vaping Use: Never used  Substance and Sexual Activity   Alcohol use: No    Alcohol/week: 0.0 standard drinks of alcohol   Drug use: No   Sexual activity: Never  Other Topics Concern   Not on file  Social History Narrative   Lives at home alone.   Right-handed.   2 cups caffeine daily.   Social Determinants of Health   Financial Resource Strain: Low Risk  (06/06/2021)   Overall Financial Resource Strain (CARDIA)    Difficulty of Paying Living Expenses: Not hard at all  Food Insecurity: No Food Insecurity (06/11/2022)   Hunger Vital Sign    Worried About Running Out of Food in the Last Year: Never true    Ran Out of Food in the Last Year: Never true  Transportation Needs: No Transportation Needs (06/11/2022)   PRAPARE - TrHydrologistMedical): No    Lack of Transportation (Non-Medical): No  Physical Activity: Insufficiently Active (06/06/2021)   Exercise Vital Sign    Days of Exercise per Week: 4 days    Minutes of Exercise per Session: 30 min  Stress: No Stress Concern Present (06/06/2021)   [place]  Feeling of Stress : Only a little  Social Connections: Socially Isolated (06/06/2021)   Social Connection and Isolation Panel [NHANES]    Frequency of Communication with Friends and Family: Once a week    Frequency of Social Gatherings with Friends and Family: Once a week    Attends Religious Services: Never    AcMarine scientistr Organizations: No    Attends ClMusic therapistNever    Marital Status: Divorced     Family  History: The patient's family history includes CAD in her father; COPD in her mother; Congestive Heart Failure in her paternal grandmother; Diabetes in her father, maternal grandmother, and mother; Heart attack in her maternal grandfather and paternal grandfather; Hyperlipidemia in her father and mother; Hypertension in her father and mother; Mental illness in her father and maternal grandmother; Stroke in her maternal grandfather and maternal grandmother. ROS:   Please see the history of present illness.    All 14 point review of systems negative except as described per history of present illness  EKGs/Labs/Other  Studies Reviewed:      Recent Labs: 04/26/2022: ALT 11; BUN 18; Creatinine 0.78; Hemoglobin 13.1; Platelet Count 496; Potassium 4.7; Sodium 140 08/02/2022: TSH 1.42  Recent Lipid Panel    Component Value Date/Time   CHOL 188 09/07/2021 1137   TRIG 80.0 09/07/2021 1137   HDL 60.80 09/07/2021 1137   CHOLHDL 3 09/07/2021 1137   VLDL 16.0 09/07/2021 1137   LDLCALC 111 (H) 09/07/2021 1137   LDLCALC 123 (H) 09/02/2019 1511    Physical Exam:    VS:  BP (!) 138/90 (BP Location: Right Arm, Patient Position: Sitting)   Pulse 82   Ht '5\' 2"'$  (1.575 m)   Wt 214 lb (97.1 kg)   SpO2 97%   BMI 39.14 kg/m     Wt Readings from Last 3 Encounters:  09/25/22 214 lb (97.1 kg)  04/26/22 214 lb 6.4 oz (97.3 kg)  03/22/22 217 lb (98.4 kg)     GEN:  Well nourished, well developed in no acute distress HEENT: Normal NECK: No JVD; No carotid bruits LYMPHATICS: No lymphadenopathy CARDIAC: RRR, no murmurs, no rubs, no gallops RESPIRATORY:  Clear to auscultation without rales, wheezing or rhonchi  ABDOMEN: Soft, non-tender, non-distended MUSCULOSKELETAL:  No edema; No deformity  SKIN: Warm and dry LOWER EXTREMITIES: no swelling NEUROLOGIC:  Alert and oriented x 3 PSYCHIATRIC:  Normal affect   ASSESSMENT:    1. Supraventricular tachycardia   2. Ventricular ectopy   3. Essential  hypertension   4. Coronary artery calcification   5. Shortness of breath    PLAN:    In order of problems listed above:  Supraventricular tachycardia denies have any palpitation we will continue monitoring. Ventricular ectopy I will ask her to have echocardiogram done to check left ventricle ejection fraction especially since she is still complaining 5 some shortness of breath. Calcification of coronary arteries noted on the CT and LAD as well as circumflex.  Will schedule him to have coronary CT angio obviously will check her kidney function before, however she only had partial nephrectomy on the recent creatinine is normal therefore we should be fine to proceed. Essential hypertension blood pressure seems to be controlled. Dyslipidemia I will schedule her to have fasting lipid profile done   Medication Adjustments/Labs and Tests Ordered: Current medicines are reviewed at length with the patient today.  Concerns regarding medicines are outlined above.  No orders of the defined types were placed in this encounter.  Medication changes: No orders of the defined types were placed in this encounter.   Signed, Park Liter, MD, [facility] 09/25/2022 11:47 AM    [place]

## 2022-09-25 NOTE — Patient Instructions (Addendum)
Medication Instructions:   Take: Metoprolol '100mg'$  1 tablet 2 hours prior to CT Scan  *If you need a refill on your cardiac medications before your next appointment, please call your pharmacy*   Lab Work: [place] recommends that you return for lab work in:  1 week prior to [place] need to have labs done when you are fasting.  You can come Monday through Friday 8:00 am to 11:30am and 1:00 to 4:00. You do not need to make an appointment as the order has already been placed. Lipids, BMP   Testing/Procedures:   Your cardiac CT will be scheduled at one of the below locations:   [facility] [address] [place], [place] [address] [phone]   At [facility], please arrive at the [facility] and Children's Entrance (Entrance C2) of [facility] 30 minutes prior to test start time. You can use the FREE valet parking offered at entrance C (encouraged to control the heart rate for the test)  Proceed to the [facility] Radiology Department (first floor) to check-in and test prep.  All radiology patients and guests should use entrance C2 at [facility], accessed from [facility], even though the hospital's physical address listed is [address]      Please follow these instructions carefully (unless otherwise directed):     On the Night Before the Test: Be sure to Drink plenty of water. Do not consume any caffeinated/decaffeinated beverages or chocolate 12 hours prior to your test. Do not take any antihistamines 12 hours prior to your test.    On the Day of the Test: Drink plenty of water until 1 hour prior to the test. Do not eat any food 4 hours prior to the test. You may take your regular medications prior to the test.  Take metoprolol (Lopressor) two hours prior to test. HOLD Furosemide/Hydrochlorothiazide morning of the test. FEMALES- please wear underwire-free bra if available,  avoid dresses & tight clothing        After the Test: Drink plenty of water. After receiving IV contrast, you may experience a mild flushed feeling. This is normal. On occasion, you may experience a mild rash up to 24 hours after the test. This is not dangerous. If this occurs, you can take Benadryl 25 mg and increase your fluid intake. If you experience trouble breathing, this can be serious. If it is severe call 911 IMMEDIATELY. If it is mild, please call our office. If you take any of these medications: Glipizide/Metformin, Avandament, Glucavance, please do not take 48 hours after completing test unless otherwise instructed.  We will call to schedule your test 2-4 weeks out understanding that some insurance companies will need an authorization prior to the service being performed.   For non-scheduling related questions, please contact the cardiac imaging nurse navigator should you have any questions/concerns: Marchia Bond, Cardiac Imaging Nurse Navigator Gordy Clement, Cardiac Imaging Nurse Navigator [facility] Direct Office Dial: [phone]   For scheduling needs, including cancellations and rescheduling, please call [place], [phone].    Follow-Up: At [facility], you and your health needs are our priority.  As part of our continuing mission to provide you with exceptional heart care, we have created designated Provider Care Teams.  These Care Teams include your primary Cardiologist (physician) and Advanced Practice Providers (APPs -  Physician Assistants and Nurse Practitioners) who all work together to provide you with the  care you need, when you need it.  We recommend signing up for the patient portal called "MyChart".  Sign up information is provided on this After Visit Summary.  MyChart is used to connect with patients for Virtual Visits (Telemedicine).  Patients are able to view lab/test results, encounter notes, upcoming appointments, etc.   Non-urgent messages can be sent to your provider as well.   To learn more about what you can do with MyChart, go to [email or web address].    Your next appointment:   6 month(s)  The format for your next appointment:   In Person  Provider:   Jenne Campus, MD   Other Instructions Cardiac CT Angiogram A cardiac CT angiogram is a procedure to look at the heart and the area around the heart. It may be done to help find the cause of chest pains or other symptoms of heart disease. During this procedure, a substance called contrast dye is injected into the blood vessels in the area to be checked. A large X-ray machine, called a CT scanner, then takes detailed pictures of the heart and the surrounding area. The procedure is also sometimes called a coronary CT angiogram, coronary artery scanning, or CTA. A cardiac CT angiogram allows the health care provider to see how well blood is flowing to and from the heart. The health care provider will be able to see if there are any problems, such as: Blockage or narrowing of the coronary arteries in the heart. Fluid around the heart. Signs of weakness or disease in the muscles, valves, and tissues of the heart. Tell a health care provider about: Any allergies you have. This is especially important if you have had a previous allergic reaction to contrast dye. All medicines you are taking, including vitamins, herbs, eye drops, creams, and over-the-counter medicines. Any blood disorders you have. Any surgeries you have had. Any medical conditions you have. Whether you are pregnant or may be pregnant. Any anxiety disorders, chronic pain, or other conditions you have that may increase your stress or prevent you from lying still. What are the risks? Generally, this is a safe procedure. However, problems may occur, including: Bleeding. Infection. Allergic reactions to medicines or dyes. Damage to other structures or organs. Kidney damage from the  contrast dye that is used. Increased risk of cancer from radiation exposure. This risk is low. Talk with your health care provider about: The risks and benefits of testing. How you can receive the lowest dose of radiation. What happens before the procedure? Wear comfortable clothing and remove any jewelry, glasses, dentures, and hearing aids. Follow instructions from your health care provider about eating and drinking. This may include: For 12 hours before the procedure -- avoid caffeine. This includes tea, coffee, soda, energy drinks, and diet pills. Drink plenty of water or other fluids that do not have caffeine in them. Being well hydrated can prevent complications. For 4-6 hours before the procedure -- stop eating and drinking. The contrast dye can cause nausea, but this is less likely if your stomach is empty. Ask your health care provider about changing or stopping your regular medicines. This is especially important if you are taking diabetes medicines, blood thinners, or medicines to treat problems with erections (erectile dysfunction). What happens during the procedure?  Hair on your chest may need to be removed so that small sticky patches called electrodes can be placed on your chest. These will transmit information that helps to monitor your heart during the procedure. An IV will  be inserted into one of your veins. You might be given a medicine to control your heart rate during the procedure. This will help to ensure that good images are obtained. You will be asked to lie on an exam table. This table will slide in and out of the CT machine during the procedure. Contrast dye will be injected into the IV. You might feel warm, or you may get a metallic taste in your mouth. You will be given a medicine called nitroglycerin. This will relax or dilate the arteries in your heart. The table that you are lying on will move into the CT machine tunnel for the scan. The person running the machine  will give you instructions while the scans are being done. You may be asked to: Keep your arms above your head. Hold your breath. Stay very still, even if the table is moving. When the scanning is complete, you will be moved out of the machine. The IV will be removed. The procedure may vary among health care providers and hospitals. What can I expect after the procedure? After your procedure, it is common to have: A metallic taste in your mouth from the contrast dye. A feeling of warmth. A headache from the nitroglycerin. Follow these instructions at home: Take over-the-counter and prescription medicines only as told by your health care provider. If you are told, drink enough fluid to keep your urine pale yellow. This will help to flush the contrast dye out of your body. Most people can return to their normal activities right after the procedure. Ask your health care provider what activities are safe for you. It is up to you to get the results of your procedure. Ask your health care provider, or the department that is doing the procedure, when your results will be ready. Keep all follow-up visits as told by your health care provider. This is important. Contact a health care provider if: You have any symptoms of allergy to the contrast dye. These include: Shortness of breath. Rash or hives. A racing heartbeat. Summary A cardiac CT angiogram is a procedure to look at the heart and the area around the heart. It may be done to help find the cause of chest pains or other symptoms of heart disease. During this procedure, a large X-ray machine, called a CT scanner, takes detailed pictures of the heart and the surrounding area after a contrast dye has been injected into blood vessels in the area. Ask your health care provider about changing or stopping your regular medicines before the procedure. This is especially important if you are taking diabetes medicines, blood thinners, or medicines to  treat erectile dysfunction. If you are told, drink enough fluid to keep your urine pale yellow. This will help to flush the contrast dye out of your body. This information is not intended to replace advice given to you by your health care provider. Make sure you discuss any questions you have with your health care provider. Document Revised: 02/25/2019 Document Reviewed: 02/25/2019 Elsevier Patient Education  2020 [place] physician has requested that you have an echocardiogram. Echocardiography is a painless test that uses sound waves to create images of your heart. It provides your doctor with information about the size and shape of your heart and how well your heart's chambers and valves are working. This procedure takes approximately one hour. There are no restrictions for this procedure. Please do NOT wear cologne, perfume, aftershave, or lotions (deodorant is allowed).  Please arrive 15 minutes prior to your appointment time.

## 2022-10-02 ENCOUNTER — Other Ambulatory Visit (HOSPITAL_BASED_OUTPATIENT_CLINIC_OR_DEPARTMENT_OTHER): Payer: Self-pay

## 2022-10-03 ENCOUNTER — Other Ambulatory Visit (HOSPITAL_COMMUNITY): Payer: Medicare HMO

## 2022-10-03 ENCOUNTER — Other Ambulatory Visit (HOSPITAL_BASED_OUTPATIENT_CLINIC_OR_DEPARTMENT_OTHER): Payer: Self-pay

## 2022-10-05 ENCOUNTER — Other Ambulatory Visit (HOSPITAL_BASED_OUTPATIENT_CLINIC_OR_DEPARTMENT_OTHER): Payer: Self-pay

## 2022-10-05 ENCOUNTER — Other Ambulatory Visit: Payer: Self-pay | Admitting: Cardiology

## 2022-10-05 NOTE — Telephone Encounter (Signed)
Refused refill, was one time dose

## 2022-10-08 ENCOUNTER — Ambulatory Visit: Payer: Medicare HMO | Admitting: Family Medicine

## 2022-10-09 ENCOUNTER — Other Ambulatory Visit (HOSPITAL_BASED_OUTPATIENT_CLINIC_OR_DEPARTMENT_OTHER): Payer: Self-pay

## 2022-10-11 ENCOUNTER — Ambulatory Visit (HOSPITAL_BASED_OUTPATIENT_CLINIC_OR_DEPARTMENT_OTHER)
Admission: RE | Admit: 2022-10-11 | Discharge: 2022-10-11 | Disposition: A | Discharge status: Home / Self Care | Payer: Medicare HMO | Source: Ambulatory Visit | Attending: Cardiology | Admitting: Cardiology

## 2022-10-11 DIAGNOSIS — R0602 Shortness of breath: Secondary | ICD-10-CM | POA: Diagnosis not present

## 2022-10-11 DIAGNOSIS — E785 Hyperlipidemia, unspecified: Secondary | ICD-10-CM | POA: Diagnosis not present

## 2022-10-11 DIAGNOSIS — I10 Essential (primary) hypertension: Secondary | ICD-10-CM | POA: Diagnosis not present

## 2022-10-11 LAB — ECHOCARDIOGRAM COMPLETE
Area-P 1/2: 3.23 cm2
MV M vel: 4.89 m/s
MV Peak grad: 95.6 mmHg
S' Lateral: 3.7 cm

## 2022-10-11 NOTE — Progress Notes (Signed)
[place] [facility] [address], [place], [place] [address] [phone]  Date:  10/15/2022   Name:  Karen Wall   DOB:  [birth year]   MRN:  [id number]  PCP:  Darreld Mclean, MD    Chief Complaint: 6-12 month f/u (Concerns/ questions: 1.CT score price? 2. Acne, and why?  She did show it to Dr Raliegh Ip. 2. New med for irregular HR. Lovette Cliche: none)   History of Present Illness:  Karen Wall is a 67 y.o. very pleasant female patient who presents with the following:  Pt seen today for periodic recheck Last visit with myself was in August  History of thyroid cancer s/p thyroidectomy 2014, HTN, renal cancer s/p partial nephrectomy 09/2018, iron def, essential thrombocytosis.  She also has chronic foot problems which limit her mobility Her urologist for history of renal cancer is Dr. Alinda Money- last visit was in March, will request this report Oncology Dr Marin Olp - appt this month   Seen by cardiology last month- Dr Raliegh Ip  Supraventricular tachycardia denies have any palpitation we will continue monitoring. Ventricular ectopy I will ask her to have echocardiogram done to check left ventricle ejection fraction especially since she is still complaining 5 some shortness of breath. Calcification of coronary arteries noted on the CT and LAD as well as circumflex.  Will schedule him to have coronary CT angio obviously will check her kidney function before, however she only had partial nephrectomy on the recent creatinine is normal therefore we should be fine to proceed. Essential hypertension blood pressure seems to be controlled. Dyslipidemia I will schedule her to have fasting lipid profile done  Shingrix Flu Mammo is due Dexa UTD  Most recent TSH was in January - she does not wish to do this today  She alternates 88 and 100 mcg levothyroxine   Exercises is limited due to her foot issues.  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   Thrombocytosis 11/22/2017   Bilateral ankle pain 10/15/2017   IDA (iron deficiency anemia) 07/23/2016   Vitamin D deficiency 03/15/2016   Low vitamin B12 level 03/15/2016   Dyspnea 05/28/2014   Chest tightness 05/28/2014   Thyroid cancer, 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   Postsurgical hypothyroidism 07/24/2013    Past Medical History:  Diagnosis Date   A-fib [facility])    history of   Acquired flat foot    Ankle pain    bilateral   Arthritis    feet ankles, and knees    Cancer of kidney parenchyma, right ([place]) 09/08/2018   Depression    Diverticulosis    Dyspnea    with exertion    Dysrhythmia    hx of afib with hyperthroid no issues currently    GERD (gastroesophageal reflux disease)    H/O hiatal hernia 08/09/2018   Moderate to Large   Hyperlipidemia    Hypertension    Hypothyroidism    Iron deficiency anemia    iron deficiency last iron infusion 08/2018   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   Numbness    Left  side of face around mouth - unknown etiology   Numbness and tingling    left side - facial   Palpitations    Occurred during hyperthyroid   PONV (postoperative nausea and vomiting)    Right renal mass 07/2018   2.1 cm solid heterogeneously enhancing mass in posterior midpole    Sinus tachycardia by electrocardiogram 05/12/2018   Thrombocythemia    Thrombocytosis 11/22/2017   Thyroid cancer ([place])    Thyroid disease    hyperthyroidism   Vitamin B 12 deficiency    Vitamin D deficiency     Past Surgical History:  Procedure Laterality Date    BREAST BIOPSY     age 43 - negative   CHOLECYSTECTOMY  1992   OPERATIVE ULTRASOUND Right 09/25/2018   Procedure: OPERATIVE ULTRASOUND;  Surgeon: Raynelle Bring, MD;  Location: [facility];  Service: Urology;  Laterality: Right;   ROBOTIC ASSITED PARTIAL NEPHRECTOMY Right 09/25/2018   Procedure: XI ROBOTIC RIGHT  ASSITED PARTIAL NEPHRECTOMY;  Surgeon: Raynelle Bring, MD;  Location: [facility];  Service: Urology;  Laterality: Right;   THYROIDECTOMY N/A 07/03/2013   Procedure: TOTAL THYROIDECTOMY;  Surgeon: Earnstine Regal, MD;  Location: [facility];  Service: General;  Laterality: N/A;    Social History   Tobacco Use   Smoking status: Never   Smokeless tobacco: Never  Vaping Use   Vaping Use: Never used  Substance Use Topics   Alcohol use: No    Alcohol/week: 0.0 standard drinks of alcohol   Drug use: No    Family History  Problem Relation Age of Onset   Hypertension Mother    Diabetes Mother    Hyperlipidemia Mother    COPD Mother    CAD Father        MI in his 85s   Diabetes Father    Hyperlipidemia Father    Hypertension Father    Mental illness Father    Mental illness Maternal Grandmother    Stroke Maternal Grandmother    Diabetes Maternal Grandmother    Stroke Maternal Grandfather    Heart attack Maternal Grandfather    Congestive Heart Failure Paternal Grandmother    Heart attack Paternal Grandfather     Allergies  Allergen Reactions   Penicillins Shortness Of Breath    Did it involve swelling of the face/tongue/throat, SOB, or low BP? Yes Did it involve sudden or severe rash/hives, skin peeling, or any reaction on the inside of your mouth or nose? No Did you need to seek medical attention at a hospital or doctor's office? Unknown When did it last happen?  60-64 years old     If all above answers are "NO", may proceed with cephalosporin use.     Medication list has been reviewed and updated.  Current Outpatient Medications on File Prior to Visit  Medication Sig Dispense  Refill   acetaminophen (TYLENOL) 500 MG tablet Take 500 mg by mouth every 6 (six) hours as needed for pain.     aspirin 81 MG EC tablet Take 81 mg by mouth daily. Swallow whole.     Cholecalciferol 125 MCG (5000 UT) capsule Take 5,000 Units by mouth once a week.     Cyanocobalamin 2500 MCG SUBL Place 1 tablet under the tongue every other day.     diltiazem (CARDIZEM CD) 180 MG 24 hr capsule Take 1 capsule (180 mg total) by mouth daily. 90 capsule 3   FLUoxetine (PROZAC) 20 MG capsule ALTERNATE BETWEEN TAKING 1 CAPSULE BY MOUTH DAILY AND  2 CAPSULES BY MOUTH DAILY (Patient taking differently: Take 20 mg by mouth See admin instructions. 1-2 cap daily) 180 capsule 1   hydroxyurea (HYDREA) 500 MG capsule Take 1 capsule (500 mg total) by mouth daily. May take with food to minimize GI side effects. 30 capsule 6   levothyroxine (SYNTHROID) 100 MCG tablet Take 1 tablet (100 mcg total) by mouth daily. 30 tablet 6   levothyroxine (SYNTHROID) 88 MCG tablet Take 1 tablet (69mcg) every other day. Alternate with the 157mcg dose. 45 tablet 3   LORazepam (ATIVAN) 1 MG tablet Take 1 tablet (1 mg total) by mouth as needed for anxiety or sleep. 30 tablet 0   lovastatin (MEVACOR) 20 MG tablet TAKE 1 TABLET (20 MG TOTAL) BY MOUTH AT BEDTIME. (Patient taking differently: Take 20 mg by mouth at bedtime.) 90 tablet 3   metoprolol tartrate (LOPRESSOR) 100 MG tablet Take one tablet 2 hours before cardiac CT for heart greater than 55. 1 tablet 0   No current facility-administered medications on file prior to visit.    Review of Systems:  As per HPI- otherwise negative.   Physical Examination: Vitals:   10/15/22 1015  BP: 120/72  Pulse: 73  Resp: 18  Temp: 97.6 F (36.4 C)  SpO2: 97%   Vitals:   10/15/22 1015  Weight: 216 lb (98 kg)  Height: 5\' 3"  (1.6 m)   Body mass index is 38.26 kg/m. Ideal Body Weight: Weight in (lb) to have BMI = 25: 140.8  GEN: no acute distress.  Obese, looks well HEENT:  Atraumatic, Normocephalic.  Bilateral TM wnl, oropharynx normal.  PEERL,EOMI.   Ears and Nose: No external deformity. CV: RRR, No M/G/R. No JVD. No thrill. No extra heart sounds. PULM: CTA B, no wheezes, crackles, rhonchi. No retractions. No resp. distress. No accessory muscle use. ABD: S, NT, ND, +BS. No rebound. No HSM. EXTR: No c/c/e PSYCH: Normally interactive. Conversant.   She also has noted a minor rash around her nose bilaterally.  She has tried over-the-counter medications and got a new pair of glasses but it did not help.  Possibly consistent with rosacea Assessment and Plan: Postsurgical hypothyroidism  Hyperlipidemia, unspecified hyperlipidemia type - Plan: lovastatin (MEVACOR) 20 MG tablet  Essential hypertension  Encounter for screening mammogram for malignant neoplasm of breast - Plan: MM 3D SCREENING MAMMOGRAM BILATERAL BREAST  Reactive depression - Plan: FLUoxetine (PROZAC) 20 MG capsule, LORazepam (ATIVAN) 1 MG tablet  Facial rash - Plan: metroNIDAZOLE (METROGEL) 1 % gel  Patient seen today for follow-up.  She is doing well on her current dose of fluoxetine, refilled.  Also refilled Ativan for as needed use.  Blood pressure is under good control.  Thyroid check is up-to-date No labs today, can do at our next visit Ordered mammogram   Signed Lamar Blinks, MD

## 2022-10-11 NOTE — Patient Instructions (Addendum)
Great to see you again today - labs not necessary today   Let me know how all things turn out with your heart test -  if needed we can change you to a more powerful statin I do recommmend getting the shingles vaccine I will reach out to Dr Alinda Money and request your most recent note  Mammogram ordered!

## 2022-10-12 ENCOUNTER — Telehealth: Payer: Self-pay

## 2022-10-12 LAB — BASIC METABOLIC PANEL
BUN/Creatinine Ratio: 18 (ref 12–28)
BUN: 12 mg/dL (ref 8–27)
CO2: 23 mmol/L (ref 20–29)
Calcium: 9 mg/dL (ref 8.7–10.3)
Chloride: 103 mmol/L (ref 96–106)
Creatinine, Ser: 0.67 mg/dL (ref 0.57–1.00)
Glucose: 88 mg/dL (ref 70–99)
Potassium: 4.5 mmol/L (ref 3.5–5.2)
Sodium: 142 mmol/L (ref 134–144)
eGFR: 96 mL/min/{1.73_m2} (ref >59)

## 2022-10-12 LAB — LIPID PANEL
Chol/HDL Ratio: 3.9 ratio (ref 0.0–4.4)
Cholesterol, Total: 236 mg/dL — ABNORMAL HIGH (ref 100–199)
HDL: 61 mg/dL (ref >39)
LDL Chol Calc (NIH): 157 mg/dL — ABNORMAL HIGH (ref 0–99)
Triglycerides: 100 mg/dL (ref 0–149)
VLDL Cholesterol Cal: 18 mg/dL (ref 5–40)

## 2022-10-12 NOTE — Telephone Encounter (Signed)
Patient notified of results.

## 2022-10-12 NOTE — Telephone Encounter (Signed)
-----   Message from Park Liter, MD sent at 10/12/2022 12:48 PM EDT ----- Echocardiogram showed preserved left ventricle ejection fraction mild mitral valve regurgitation, overall looks good

## 2022-10-15 ENCOUNTER — Other Ambulatory Visit (HOSPITAL_BASED_OUTPATIENT_CLINIC_OR_DEPARTMENT_OTHER): Payer: Self-pay

## 2022-10-15 ENCOUNTER — Encounter: Payer: Self-pay | Admitting: Family

## 2022-10-15 ENCOUNTER — Ambulatory Visit (INDEPENDENT_AMBULATORY_CARE_PROVIDER_SITE_OTHER): Payer: Medicare HMO | Admitting: Family Medicine

## 2022-10-15 VITALS — BP 120/72 | HR 73 | Temp 97.6°F | Resp 18 | Ht 63.0 in | Wt 216.0 lb

## 2022-10-15 DIAGNOSIS — F329 Major depressive disorder, single episode, unspecified: Secondary | ICD-10-CM | POA: Diagnosis not present

## 2022-10-15 DIAGNOSIS — R21 Rash and other nonspecific skin eruption: Secondary | ICD-10-CM

## 2022-10-15 DIAGNOSIS — E890 Postprocedural hypothyroidism: Secondary | ICD-10-CM

## 2022-10-15 DIAGNOSIS — E785 Hyperlipidemia, unspecified: Secondary | ICD-10-CM

## 2022-10-15 DIAGNOSIS — Z1231 Encounter for screening mammogram for malignant neoplasm of breast: Secondary | ICD-10-CM

## 2022-10-15 DIAGNOSIS — I10 Essential (primary) hypertension: Secondary | ICD-10-CM | POA: Diagnosis not present

## 2022-10-15 MED ORDER — METRONIDAZOLE 1 % EX GEL
Freq: Every day | CUTANEOUS | 0 refills | Status: AC
  Filled 2022-10-15 (×2): qty 60, 30d supply, fill #0

## 2022-10-15 MED ORDER — LORAZEPAM 1 MG PO TABS
1.0000 mg | ORAL_TABLET | ORAL | 1 refills | Status: DC | PRN
  Filled 2022-10-15: qty 30, 30d supply, fill #0
  Filled 2023-02-19: qty 30, 30d supply, fill #1

## 2022-10-15 MED ORDER — LOVASTATIN 20 MG PO TABS
20.0000 mg | ORAL_TABLET | Freq: Every day | ORAL | 3 refills | Status: DC
  Filled 2022-10-15: qty 90, 90d supply, fill #0

## 2022-10-15 MED ORDER — FLUOXETINE HCL 20 MG PO CAPS
40.0000 mg | ORAL_CAPSULE | Freq: Every day | ORAL | 3 refills | Status: DC
  Filled 2022-10-15: qty 180, 90d supply, fill #0
  Filled 2023-01-16: qty 180, 90d supply, fill #1
  Filled 2023-05-06: qty 180, 90d supply, fill #2

## 2022-10-16 ENCOUNTER — Ambulatory Visit (HOSPITAL_COMMUNITY): Payer: Medicare HMO

## 2022-10-16 ENCOUNTER — Other Ambulatory Visit (HOSPITAL_BASED_OUTPATIENT_CLINIC_OR_DEPARTMENT_OTHER): Payer: Self-pay

## 2022-10-16 ENCOUNTER — Telehealth: Payer: Self-pay

## 2022-10-16 DIAGNOSIS — E785 Hyperlipidemia, unspecified: Secondary | ICD-10-CM

## 2022-10-16 MED ORDER — LOVASTATIN 40 MG PO TABS
40.0000 mg | ORAL_TABLET | Freq: Every day | ORAL | 3 refills | Status: DC
  Filled 2022-10-16: qty 90, 90d supply, fill #0
  Filled 2023-01-16: qty 90, 90d supply, fill #1
  Filled 2023-04-16: qty 90, 90d supply, fill #2

## 2022-10-16 NOTE — Telephone Encounter (Signed)
Cholesterol Results reviewed with pt as per Dr. Wendy Poet note. Pt agreed to double dose of Lovastatin and come back for lab work in 6 weeks. Pt verbalized understanding and had no additional questions. Routed to PCP

## 2022-10-19 ENCOUNTER — Telehealth (HOSPITAL_COMMUNITY): Payer: Self-pay | Admitting: Emergency Medicine

## 2022-10-19 NOTE — Telephone Encounter (Signed)
Reaching out to patient to offer assistance regarding upcoming cardiac imaging study; pt verbalizes understanding of appt date/time, parking situation and where to check in, pre-test NPO status and medications ordered, and verified current allergies; name and call back number provided for further questions should they arise Tzipora Mcinroy RN Navigator Cardiac Imaging [facility] [phone] office [phone] cell 

## 2022-10-22 ENCOUNTER — Ambulatory Visit (HOSPITAL_COMMUNITY)
Admission: RE | Admit: 2022-10-22 | Discharge: 2022-10-22 | Disposition: A | Discharge status: Home / Self Care | Payer: Medicare HMO | Source: Ambulatory Visit | Attending: Cardiology | Admitting: Cardiology

## 2022-10-22 DIAGNOSIS — I2510 Atherosclerotic heart disease of native coronary artery without angina pectoris: Secondary | ICD-10-CM | POA: Insufficient documentation

## 2022-10-22 DIAGNOSIS — R0602 Shortness of breath: Secondary | ICD-10-CM | POA: Insufficient documentation

## 2022-10-22 MED ORDER — NITROGLYCERIN 0.4 MG SL SUBL
0.8000 mg | SUBLINGUAL_TABLET | SUBLINGUAL | Status: DC | PRN
  Administered 2022-10-22: 0.8 mg via SUBLINGUAL

## 2022-10-22 MED ORDER — IOHEXOL 350 MG/ML SOLN
100.0000 mL | Freq: Once | INTRAVENOUS | Status: AC | PRN
  Administered 2022-10-22: 100 mL via INTRAVENOUS

## 2022-10-22 MED ORDER — NITROGLYCERIN 0.4 MG SL SUBL
SUBLINGUAL_TABLET | SUBLINGUAL | Status: AC
  Filled 2022-10-22: qty 2

## 2022-10-25 ENCOUNTER — Ambulatory Visit (HOSPITAL_BASED_OUTPATIENT_CLINIC_OR_DEPARTMENT_OTHER)
Admission: RE | Admit: 2022-10-25 | Discharge: 2022-10-25 | Disposition: A | Discharge status: Home / Self Care | Payer: Medicare HMO | Source: Ambulatory Visit | Attending: Family Medicine | Admitting: Family Medicine

## 2022-10-25 ENCOUNTER — Other Ambulatory Visit: Payer: Self-pay

## 2022-10-25 ENCOUNTER — Inpatient Hospital Stay: Payer: Medicare HMO | Attending: Hematology & Oncology

## 2022-10-25 ENCOUNTER — Encounter: Payer: Self-pay | Admitting: Medical Oncology

## 2022-10-25 ENCOUNTER — Encounter (HOSPITAL_BASED_OUTPATIENT_CLINIC_OR_DEPARTMENT_OTHER): Payer: Self-pay

## 2022-10-25 ENCOUNTER — Inpatient Hospital Stay: Payer: Medicare HMO | Admitting: Medical Oncology

## 2022-10-25 VITALS — BP 123/72 | HR 75 | Temp 98.4°F | Resp 18 | Ht 63.0 in | Wt 213.0 lb

## 2022-10-25 DIAGNOSIS — C641 Malignant neoplasm of right kidney, except renal pelvis: Secondary | ICD-10-CM

## 2022-10-25 DIAGNOSIS — D500 Iron deficiency anemia secondary to blood loss (chronic): Secondary | ICD-10-CM | POA: Diagnosis not present

## 2022-10-25 DIAGNOSIS — D509 Iron deficiency anemia, unspecified: Secondary | ICD-10-CM | POA: Insufficient documentation

## 2022-10-25 DIAGNOSIS — Z905 Acquired absence of kidney: Secondary | ICD-10-CM | POA: Insufficient documentation

## 2022-10-25 DIAGNOSIS — Z1231 Encounter for screening mammogram for malignant neoplasm of breast: Secondary | ICD-10-CM | POA: Insufficient documentation

## 2022-10-25 DIAGNOSIS — D75839 Thrombocytosis, unspecified: Secondary | ICD-10-CM | POA: Insufficient documentation

## 2022-10-25 DIAGNOSIS — Z79899 Other long term (current) drug therapy: Secondary | ICD-10-CM | POA: Insufficient documentation

## 2022-10-25 DIAGNOSIS — Z85528 Personal history of other malignant neoplasm of kidney: Secondary | ICD-10-CM | POA: Diagnosis not present

## 2022-10-25 LAB — CBC WITH DIFFERENTIAL/PLATELET
Abs Immature Granulocytes: 0.02 10*3/uL (ref 0.00–0.07)
Basophils Absolute: 0 10*3/uL (ref 0.0–0.1)
Basophils Relative: 0 %
Eosinophils Absolute: 0.2 10*3/uL (ref 0.0–0.5)
Eosinophils Relative: 2 %
HCT: 40.5 % (ref 36.0–46.0)
Hemoglobin: 12.9 g/dL (ref 12.0–15.0)
Immature Granulocytes: 0 %
Lymphocytes Relative: 28 %
Lymphs Abs: 2.2 10*3/uL (ref 0.7–4.0)
MCH: 25.4 pg — ABNORMAL LOW (ref 26.0–34.0)
MCHC: 31.9 g/dL (ref 30.0–36.0)
MCV: 79.7 fL — ABNORMAL LOW (ref 80.0–100.0)
Monocytes Absolute: 0.7 10*3/uL (ref 0.1–1.0)
Monocytes Relative: 10 %
Neutro Abs: 4.6 10*3/uL (ref 1.7–7.7)
Neutrophils Relative %: 60 %
Platelets: 566 10*3/uL — ABNORMAL HIGH (ref 150–400)
RBC: 5.08 MIL/uL (ref 3.87–5.11)
RDW: 15.5 % (ref 11.5–15.5)
WBC: 7.6 10*3/uL (ref 4.0–10.5)
nRBC: 0 % (ref 0.0–0.2)

## 2022-10-25 LAB — CMP (CANCER CENTER ONLY)
ALT: 10 U/L (ref 0–44)
AST: 12 U/L — ABNORMAL LOW (ref 15–41)
Albumin: 3.9 g/dL (ref 3.5–5.0)
Alkaline Phosphatase: 60 U/L (ref 38–126)
Anion gap: 9 (ref 5–15)
BUN: 14 mg/dL (ref 8–23)
CO2: 27 mmol/L (ref 22–32)
Calcium: 8.9 mg/dL (ref 8.9–10.3)
Chloride: 100 mmol/L (ref 98–111)
Creatinine: 0.82 mg/dL (ref 0.44–1.00)
GFR, Estimated: >60 mL/min (ref >60)
Glucose, Bld: 103 mg/dL — ABNORMAL HIGH (ref 70–99)
Potassium: 4.3 mmol/L (ref 3.5–5.1)
Sodium: 136 mmol/L (ref 135–145)
Total Bilirubin: 0.4 mg/dL (ref 0.3–1.2)
Total Protein: 7.5 g/dL (ref 6.5–8.1)

## 2022-10-25 LAB — FERRITIN: Ferritin: 14 ng/mL (ref 11–307)

## 2022-10-25 LAB — IRON AND IRON BINDING CAPACITY (CC-WL,HP ONLY)
Iron: 43 ug/dL (ref 28–170)
Saturation Ratios: 10 % — ABNORMAL LOW (ref 10.4–31.8)
TIBC: 447 ug/dL (ref 250–450)
UIBC: 404 ug/dL (ref 148–442)

## 2022-10-25 LAB — LACTATE DEHYDROGENASE: LDH: 127 U/L (ref 98–192)

## 2022-10-25 LAB — SAVE SMEAR(SSMR), FOR PROVIDER SLIDE REVIEW

## 2022-10-25 NOTE — Progress Notes (Signed)
Hematology and Oncology Follow Up Visit   Karen Wall [id number] Oct 26, 1955 67 y.o. 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  We last saw her back in October.  Since then, she has been doing well.  Mild fatigue but other than that she is doing well.   Her last iron studies back in October showed a ferritin of 32 with an iron saturation of 11% so she is not on hydrea. She is not taking oral iron given intolerance. She is taking her asa. No bleeding or bruising episodes.   She has had follow up with Dr. Laverle Patter who managed her kidney cancer history. Last CT scan of chest was on 09/15/2022 which showed some cardiac plaque. She has had cardiology follow up from this and had a non concerning coronary CT scan yesterday.   She has had no change in bowel or bladder habits.  She has had no cough or shortness of breath.  There has been no nausea or vomiting. No unintentional weight loss, night sweats, hematuria or flank pain.   Overall, her performance status right now is ECOG 1.    Wt Readings from Last 3 Encounters:  10/25/22 213 lb (96.6 kg)  10/15/22 216 lb (98 kg)  09/25/22 214 lb (97.1 kg)      Medications:  Current Outpatient Medications:    acetaminophen (TYLENOL) 500 MG tablet, Take 500 mg by mouth every 6 (six) hours as needed for pain., Disp: , Rfl:    amLODipine-benazepril (LOTREL) 5-20 MG capsule, Take 1 capsule by mouth at bedtime., Disp: 90 capsule, Rfl: 1   LORazepam (ATIVAN) 1 MG tablet, Take 1 tablet (1 mg total) by mouth at bedtime as needed for anxiety or sleep.,  Disp: 30 tablet, Rfl: 0   lovastatin (MEVACOR) 20 MG tablet, Take 1 tablet (20 mg total) by mouth at bedtime., Disp: 90 tablet, Rfl: 3   thyroid (ARMOUR THYROID) 15 MG tablet, Take 1 tablet (15 mg total) by mouth daily. Along with the 60 mg tablet. (Patient taking differently: Take 15 mg by mouth daily before breakfast. Along with the 60 mg tablet.), Disp: 90 tablet, Rfl: 3   thyroid (ARMOUR THYROID) 60 MG tablet, Take every day along with the 15 mg tablet. (Patient taking differently: Take 60 mg by mouth daily before breakfast. Take every day along with the 15 mg tablet.), Disp: 90 tablet, Rfl: 3   traMADol (ULTRAM) 50 MG tablet, Take 1-2 tablets (50-100 mg total) by mouth every 6 (six) hours as needed for moderate pain or severe pain., Disp: 20 tablet, Rfl: 0   Allergies:       Allergies  Allergen Reactions   Penicillins Shortness Of Breath      Did it involve swelling of the face/tongue/throat, SOB, or low BP? Yes Did it involve sudden or severe rash/hives, skin peeling, or any reaction on the  inside of your mouth or nose? No Did you need to seek medical attention at a hospital or doctor's office? Unknown When did it last happen?  69-67 years old     If all above answers are "NO", may proceed with cephalosporin use.        Past Medical History, Surgical history, Social history, and Family History were reviewed and updated.   Review of Systems: Review of Systems  Constitutional: Negative.   HENT:  Negative.   Eyes: Negative.   Respiratory: Negative.   Cardiovascular: Negative.   Gastrointestinal: Negative.   Endocrine: Negative.   Genitourinary: Negative.    Musculoskeletal: Negative.   Skin: Negative.   Neurological: Negative.   Hematological: Negative.   Psychiatric/Behavioral: Negative.       Physical Exam: Vital signs this visit show temperature of 97.1.  Pulse is 78.  Blood pressure 100/66.  Weight is 209 pounds.      Wt Readings from Last 3 Encounters:  09/25/18 213  lb (96.6 kg)  09/23/18 213 lb (96.6 kg)  09/08/18 198 lb (89.8 kg)      Physical Exam Vitals signs reviewed.  HENT:     Head: Normocephalic and atraumatic.  Eyes:     Pupils: Pupils are equal, round, and reactive to light.  Neck:     Musculoskeletal: Normal range of motion.  Cardiovascular:     Rate and Rhythm: Normal rate and regular rhythm.     Heart sounds: Normal heart sounds.  Pulmonary:     Effort: Pulmonary effort is normal.     Breath sounds: Normal breath sounds.  Abdominal:     General: Bowel sounds are normal.     Palpations: Abdomen is soft.  Musculoskeletal: Normal range of motion.        General: No tenderness or deformity.  Lymphadenopathy:     Cervical: No cervical adenopathy.  Skin:    General: Skin is warm and dry.     Findings: No erythema or rash.  Neurological:     Mental Status: She is alert and oriented to person, place, and time.  Psychiatric:        Behavior: Behavior normal.        Thought Content: Thought content normal.        Judgment: Judgment normal.          Recent Labs       Lab Results  Component Value Date    WBC 7.3 09/23/2018    HGB 12.0 09/26/2018    HCT 39.4 09/26/2018    MCV 87.6 09/23/2018    PLT 479 (H) 09/23/2018        Chemistry    Labs (Brief)          Component Value Date/Time    NA 137 09/26/2018 0339    K 4.0 09/26/2018 0339    CL 105 09/26/2018 0339    CO2 24 09/26/2018 0339    BUN 9 09/26/2018 0339    CREATININE 0.70 09/26/2018 0339    CREATININE 0.81 09/08/2018 1148    CREATININE 0.74 05/18/2015 1356      Labs (Brief)          Component Value Date/Time    CALCIUM 8.6 (L) 09/26/2018 0339    ALKPHOS 52 09/08/2018 1148    AST 13 (L) 09/08/2018 1148    ALT 13 09/08/2018 1148    BILITOT 0.2 (L) 09/08/2018 1148           Impression and Plan: Ms.  Karen Wall is a 67 year old white female.    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 She is s/p right nephrectomy- 09/2018. She is followed by Dr. Laverle PatterBorden for this. Per patient they elected to skip MR of abdomen this year and complete her final surveillance imaging next year. Creatinine looks good today- especially considering that she just had IV contrast yesterday. Continue follow up with Dr. Laverle PatterBorden.  Thrombocythemia: Essential Thrombocythemia, Has IDA component as well. Has Hydrea but has not started this. Platelets are 566 today which is up slightly from where it has been. MCV is 79.7. Hemoglobin is 12.9. Iron studied pending but expected to be low. Will set up for iron infusions.   IDA: She did a Cologuard this year which was negative. She has elected to forgo endoscopy or capsule studies at this time after discussion with PCP- given current yearly CT/MR abdominal/chest imaging. No other blood loss noted. She has done well with iron transfusion in the past. She does not tolerate oral iron well.   RTC 6 months  Disposition: Iron infusions orders placed RTC 6 months MD, labs  Rushie ChestnutSarah M Adria Costley PA-C  6/11/202012:28 PM

## 2022-11-01 ENCOUNTER — Telehealth: Payer: Self-pay

## 2022-11-01 NOTE — Telephone Encounter (Signed)
CT Results reviewed with pt as per Dr. Vanetta Shawl note.  Pt verbalized understanding and had no additional questions. Routed to PCP

## 2022-11-08 ENCOUNTER — Inpatient Hospital Stay: Payer: Medicare HMO

## 2022-11-08 VITALS — BP 122/74 | HR 67 | Resp 16

## 2022-11-08 DIAGNOSIS — Z905 Acquired absence of kidney: Secondary | ICD-10-CM | POA: Diagnosis not present

## 2022-11-08 DIAGNOSIS — Z85528 Personal history of other malignant neoplasm of kidney: Secondary | ICD-10-CM | POA: Diagnosis not present

## 2022-11-08 DIAGNOSIS — Z79899 Other long term (current) drug therapy: Secondary | ICD-10-CM | POA: Diagnosis not present

## 2022-11-08 DIAGNOSIS — D75839 Thrombocytosis, unspecified: Secondary | ICD-10-CM | POA: Diagnosis not present

## 2022-11-08 DIAGNOSIS — D509 Iron deficiency anemia, unspecified: Secondary | ICD-10-CM | POA: Diagnosis not present

## 2022-11-08 DIAGNOSIS — D508 Other iron deficiency anemias: Secondary | ICD-10-CM

## 2022-11-08 DIAGNOSIS — C641 Malignant neoplasm of right kidney, except renal pelvis: Secondary | ICD-10-CM

## 2022-11-08 MED ORDER — SODIUM CHLORIDE 0.9 % IV SOLN: Freq: Once | INTRAVENOUS | Status: AC

## 2022-11-08 MED ORDER — SODIUM CHLORIDE 0.9 % IV SOLN
200.0000 mg | Freq: Once | INTRAVENOUS | Status: AC
  Administered 2022-11-08: 200 mg via INTRAVENOUS
  Filled 2022-11-08: qty 200

## 2022-11-08 NOTE — Patient Instructions (Signed)

## 2022-12-04 ENCOUNTER — Other Ambulatory Visit (HOSPITAL_BASED_OUTPATIENT_CLINIC_OR_DEPARTMENT_OTHER): Payer: Self-pay

## 2023-02-19 ENCOUNTER — Other Ambulatory Visit: Payer: Self-pay

## 2023-02-19 ENCOUNTER — Other Ambulatory Visit (HOSPITAL_BASED_OUTPATIENT_CLINIC_OR_DEPARTMENT_OTHER): Payer: Self-pay

## 2023-03-15 ENCOUNTER — Ambulatory Visit: Payer: Medicare HMO | Admitting: Cardiology

## 2023-04-26 ENCOUNTER — Inpatient Hospital Stay: Payer: Medicare HMO

## 2023-04-26 ENCOUNTER — Ambulatory Visit: Payer: Medicare HMO | Admitting: Family

## 2023-04-30 ENCOUNTER — Other Ambulatory Visit: Payer: Self-pay | Admitting: *Deleted

## 2023-04-30 DIAGNOSIS — D75839 Thrombocytosis, unspecified: Secondary | ICD-10-CM

## 2023-04-30 DIAGNOSIS — K909 Intestinal malabsorption, unspecified: Secondary | ICD-10-CM

## 2023-04-30 DIAGNOSIS — D508 Other iron deficiency anemias: Secondary | ICD-10-CM

## 2023-05-01 ENCOUNTER — Inpatient Hospital Stay: Payer: Medicare HMO | Admitting: Medical Oncology

## 2023-05-01 ENCOUNTER — Encounter: Payer: Self-pay | Admitting: Medical Oncology

## 2023-05-01 ENCOUNTER — Inpatient Hospital Stay: Payer: Medicare HMO | Attending: Medical Oncology

## 2023-05-01 ENCOUNTER — Other Ambulatory Visit: Payer: Self-pay

## 2023-05-01 ENCOUNTER — Other Ambulatory Visit (HOSPITAL_BASED_OUTPATIENT_CLINIC_OR_DEPARTMENT_OTHER): Payer: Self-pay

## 2023-05-01 VITALS — BP 131/60 | HR 86 | Temp 98.0°F | Resp 18 | Ht 63.0 in | Wt 210.0 lb

## 2023-05-01 DIAGNOSIS — D500 Iron deficiency anemia secondary to blood loss (chronic): Secondary | ICD-10-CM

## 2023-05-01 DIAGNOSIS — C641 Malignant neoplasm of right kidney, except renal pelvis: Secondary | ICD-10-CM | POA: Diagnosis not present

## 2023-05-01 DIAGNOSIS — D473 Essential (hemorrhagic) thrombocythemia: Secondary | ICD-10-CM | POA: Insufficient documentation

## 2023-05-01 DIAGNOSIS — K909 Intestinal malabsorption, unspecified: Secondary | ICD-10-CM

## 2023-05-01 DIAGNOSIS — D508 Other iron deficiency anemias: Secondary | ICD-10-CM | POA: Insufficient documentation

## 2023-05-01 DIAGNOSIS — D75839 Thrombocytosis, unspecified: Secondary | ICD-10-CM

## 2023-05-01 DIAGNOSIS — Z79899 Other long term (current) drug therapy: Secondary | ICD-10-CM | POA: Diagnosis not present

## 2023-05-01 LAB — CBC WITH DIFFERENTIAL (CANCER CENTER ONLY)
Abs Immature Granulocytes: 0.04 10*3/uL (ref 0.00–0.07)
Basophils Absolute: 0 10*3/uL (ref 0.0–0.1)
Basophils Relative: 1 %
Eosinophils Absolute: 0.2 10*3/uL (ref 0.0–0.5)
Eosinophils Relative: 3 %
HCT: 38.6 % (ref 36.0–46.0)
Hemoglobin: 11.7 g/dL — ABNORMAL LOW (ref 12.0–15.0)
Immature Granulocytes: 1 %
Lymphocytes Relative: 26 %
Lymphs Abs: 2.1 10*3/uL (ref 0.7–4.0)
MCH: 23.4 pg — ABNORMAL LOW (ref 26.0–34.0)
MCHC: 30.3 g/dL (ref 30.0–36.0)
MCV: 77.2 fL — ABNORMAL LOW (ref 80.0–100.0)
Monocytes Absolute: 0.7 10*3/uL (ref 0.1–1.0)
Monocytes Relative: 9 %
Neutro Abs: 5 10*3/uL (ref 1.7–7.7)
Neutrophils Relative %: 60 %
Platelet Count: 572 10*3/uL — ABNORMAL HIGH (ref 150–400)
RBC: 5 MIL/uL (ref 3.87–5.11)
RDW: 16.5 % — ABNORMAL HIGH (ref 11.5–15.5)
WBC Count: 8.2 10*3/uL (ref 4.0–10.5)
nRBC: 0 % (ref 0.0–0.2)

## 2023-05-01 LAB — CMP (CANCER CENTER ONLY)
ALT: 11 U/L (ref 0–44)
AST: 15 U/L (ref 15–41)
Albumin: 3.9 g/dL (ref 3.5–5.0)
Alkaline Phosphatase: 57 U/L (ref 38–126)
Anion gap: 10 (ref 5–15)
BUN: 14 mg/dL (ref 8–23)
CO2: 27 mmol/L (ref 22–32)
Calcium: 9.2 mg/dL (ref 8.9–10.3)
Chloride: 104 mmol/L (ref 98–111)
Creatinine: 0.81 mg/dL (ref 0.44–1.00)
GFR, Estimated: >60 mL/min (ref >60)
Glucose, Bld: 105 mg/dL — ABNORMAL HIGH (ref 70–99)
Potassium: 5.3 mmol/L — ABNORMAL HIGH (ref 3.5–5.1)
Sodium: 141 mmol/L (ref 135–145)
Total Bilirubin: 0.4 mg/dL (ref 0.3–1.2)
Total Protein: 7.4 g/dL (ref 6.5–8.1)

## 2023-05-01 LAB — IRON AND IRON BINDING CAPACITY (CC-WL,HP ONLY)
Iron: 36 ug/dL (ref 28–170)
Saturation Ratios: 8 % — ABNORMAL LOW (ref 10.4–31.8)
TIBC: 449 ug/dL (ref 250–450)
UIBC: 413 ug/dL (ref 148–442)

## 2023-05-01 LAB — FERRITIN: Ferritin: 10 ng/mL — ABNORMAL LOW (ref 11–307)

## 2023-05-01 LAB — SAVE SMEAR(SSMR), FOR PROVIDER SLIDE REVIEW

## 2023-05-01 LAB — LACTATE DEHYDROGENASE: LDH: 136 U/L (ref 98–192)

## 2023-05-01 NOTE — Progress Notes (Signed)
Hematology and Oncology Follow Up Visit   Karen Wall [id number] 15-Apr-1956 67 y.o. 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  We last saw her back in April.   Today she reports that she has been doing ok. No changes to her health or concerns today.   Her last iron studies back in April showed a ferritin of 14 with an iron saturation of 10% so she is not on hydrea. She was given one infusion of Venofer 200 mg. Sheis not on oral iron. Has a low iron diet. She is not taking an oral iron given intolerance. She is taking her asa. No bleeding or bruising episodes.   She has had follow up with Karen Wall who managed her kidney cancer history. Last CT scan of chest was on 09/15/2022 which showed some cardiac plaque. She has had further evaluation by cardiology for this.  Her next imaging is scheduled for next year.   She has had no change in bowel or bladder habits.  She has had no cough or shortness of breath.  There has been no nausea or vomiting. No unintentional weight loss, night sweats, hematuria or flank pain.   Overall, her performance status right now is ECOG 1.    Wt Readings from Last 3 Encounters:  05/01/23 210 lb (95.3 kg)  10/25/22 213 lb (96.6 kg)  10/15/22 216 lb (98 kg)      Medications:  Current Outpatient Medications:    acetaminophen (TYLENOL) 500 MG tablet, Take 500 mg by mouth every 6 (six) hours as needed for pain., Disp: , Rfl:    amLODipine-benazepril (LOTREL) 5-20 MG capsule, Take 1 capsule by mouth at bedtime., Disp: 90 capsule, Rfl: 1   LORazepam (ATIVAN)  1 MG tablet, Take 1 tablet (1 mg total) by mouth at bedtime as needed for anxiety or sleep., Disp: 30 tablet, Rfl: 0   lovastatin (MEVACOR) 20 MG tablet, Take 1 tablet (20 mg total) by mouth at bedtime., Disp: 90 tablet, Rfl: 3   thyroid (ARMOUR THYROID) 15 MG tablet, Take 1 tablet (15 mg total) by mouth daily. Along with the 60 mg tablet. (Patient taking differently: Take 15 mg by mouth daily before breakfast. Along with the 60 mg tablet.), Disp: 90 tablet, Rfl: 3   thyroid (ARMOUR THYROID) 60 MG tablet, Take every day along with the 15 mg tablet. (Patient taking differently: Take 60 mg by mouth daily before breakfast. Take every day along with the 15 mg tablet.), Disp: 90 tablet, Rfl: 3   traMADol (ULTRAM) 50 MG tablet, Take 1-2 tablets (50-100 mg total) by mouth every 6 (six) hours as needed for moderate pain or severe pain., Disp: 20 tablet, Rfl: 0   Allergies:       Allergies  Allergen Reactions   Penicillins Shortness Of Breath      Did it involve swelling of  the face/tongue/throat, SOB, or low BP? Yes Did it involve sudden or severe rash/hives, skin peeling, or any reaction on the inside of your mouth or nose? No Did you need to seek medical attention at a hospital or doctor's office? Unknown When did it last happen?  40-3 years old     If all above answers are "NO", may proceed with cephalosporin use.        Past Medical History, Surgical history, Social history, and Family History were reviewed and updated.   Review of Systems: Review of Systems  Constitutional: Negative.   HENT:  Negative.   Eyes: Negative.   Respiratory: Negative.   Cardiovascular: Negative.   Gastrointestinal: Negative.   Endocrine: Negative.   Genitourinary: Negative.    Musculoskeletal: Negative.   Skin: Negative.   Neurological: Negative.   Hematological: Negative.   Psychiatric/Behavioral: Negative.       Physical Exam: Vital signs this visit show temperature of 97.1.  Pulse is 78.  Blood  pressure 100/66.  Weight is 209 pounds.      Wt Readings from Last 3 Encounters:  09/25/18 213 lb (96.6 kg)  09/23/18 213 lb (96.6 kg)  09/08/18 198 lb (89.8 kg)      Physical Exam Vitals signs reviewed.  HENT:     Head: Normocephalic and atraumatic.  Eyes:     Pupils: Pupils are equal, round, and reactive to light.  Neck:     Musculoskeletal: Normal range of motion.  Cardiovascular:     Rate and Rhythm: Normal rate and regular rhythm.     Heart sounds: Normal heart sounds.  Pulmonary:     Effort: Pulmonary effort is normal.     Breath sounds: Normal breath sounds.  Abdominal:     General: Bowel sounds are normal.     Palpations: Abdomen is soft.  Musculoskeletal: Normal range of motion.        General: No tenderness or deformity.  Lymphadenopathy:     Cervical: No cervical adenopathy.  Skin:    General: Skin is warm and dry.     Findings: No erythema or rash.  Neurological:     Mental Status: She is alert and oriented to person, place, and time.  Psychiatric:        Behavior: Behavior normal.        Thought Content: Thought content normal.        Judgment: Judgment normal.          Recent Labs       Lab Results  Component Value Date    WBC 7.3 09/23/2018    HGB 12.0 09/26/2018    HCT 39.4 09/26/2018    MCV 87.6 09/23/2018    PLT 479 (H) 09/23/2018        Chemistry    Labs (Brief)          Component Value Date/Time    NA 137 09/26/2018 0339    K 4.0 09/26/2018 0339    CL 105 09/26/2018 0339    CO2 24 09/26/2018 0339    BUN 9 09/26/2018 0339    CREATININE 0.70 09/26/2018 0339    CREATININE 0.81 09/08/2018 1148    CREATININE 0.74 05/18/2015 1356      Labs (Brief)          Component Value Date/Time    CALCIUM 8.6 (L) 09/26/2018 0339    ALKPHOS 52 09/08/2018 1148    AST 13 (L) 09/08/2018 1148    ALT 13 09/08/2018 1148  BILITOT 0.2 (L) 09/08/2018 1148         Encounter Diagnoses  Name Primary?   Iron deficiency anemia secondary to  inadequate dietary iron intake Yes   Thrombocytosis    Iron deficiency anemia due to chronic blood loss    Cancer of kidney parenchyma, right (HCC)    Iron malabsorption      Impression and Plan: Karen Wall is a 67 year old white female.    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 She is s/p right nephrectomy- 09/2018. She is followed by Karen Wall for this. Final surveillance imaging is scheduled for next year per patient.   Thrombocythemia: Essential Thrombocythemia, Has IDA component as well. Has Hydrea but has not started this. Platelets are 572 today which is up slightly from where it has been. MCV is 77.2. Hemoglobin is 11.7. Iron studied pending but expected to be low. Will set up for iron infusions.   IDA: S/p recent Cologuard which was negative. She has elected to forgo endoscopy or capsule studies at this time after discussion with PCP- given current yearly CT/MR abdominal/chest imaging. No other blood loss noted. Labs suggestive that she needs additional iron. Likely due to the light incomplete treatment she had in the past. I have recommended Venofer 300 mg once weekly x 3 weeks total.   Disposition: Iron infusions orders placed RTC 2 months APP, labs  Rushie Chestnut PA-C  6/11/202012:28 PM

## 2023-05-06 ENCOUNTER — Other Ambulatory Visit: Payer: Self-pay | Admitting: Medical Oncology

## 2023-05-06 ENCOUNTER — Other Ambulatory Visit: Payer: Self-pay

## 2023-05-06 ENCOUNTER — Other Ambulatory Visit: Payer: Self-pay | Admitting: Family Medicine

## 2023-05-06 ENCOUNTER — Other Ambulatory Visit (HOSPITAL_BASED_OUTPATIENT_CLINIC_OR_DEPARTMENT_OTHER): Payer: Self-pay

## 2023-05-06 DIAGNOSIS — E890 Postprocedural hypothyroidism: Secondary | ICD-10-CM

## 2023-05-06 DIAGNOSIS — F329 Major depressive disorder, single episode, unspecified: Secondary | ICD-10-CM

## 2023-05-06 MED ORDER — LEVOTHYROXINE SODIUM 100 MCG PO TABS
100.0000 ug | ORAL_TABLET | Freq: Every day | ORAL | 6 refills | Status: DC
  Filled 2023-05-06: qty 30, 30d supply, fill #0
  Filled 2023-07-02: qty 30, 30d supply, fill #1
  Filled 2023-09-10: qty 30, 30d supply, fill #2
  Filled 2023-10-10: qty 30, 30d supply, fill #3
  Filled 2023-12-16: qty 30, 30d supply, fill #4
  Filled 2024-01-20: qty 30, 30d supply, fill #5

## 2023-05-06 MED ORDER — LEVOTHYROXINE SODIUM 88 MCG PO TABS
88.0000 ug | ORAL_TABLET | ORAL | 3 refills | Status: DC
  Filled 2023-05-06: qty 45, 90d supply, fill #0
  Filled 2023-07-22: qty 45, 90d supply, fill #1
  Filled 2023-10-10: qty 45, 90d supply, fill #2
  Filled 2024-01-20: qty 45, 90d supply, fill #3

## 2023-05-06 MED ORDER — LORAZEPAM 1 MG PO TABS
1.0000 mg | ORAL_TABLET | ORAL | 1 refills | Status: DC | PRN
  Filled 2023-05-06: qty 30, 30d supply, fill #0
  Filled 2023-10-10: qty 30, 30d supply, fill #1

## 2023-05-09 ENCOUNTER — Inpatient Hospital Stay: Payer: Medicare HMO

## 2023-05-09 VITALS — BP 130/84 | HR 65 | Temp 98.1°F | Resp 17

## 2023-05-09 DIAGNOSIS — D473 Essential (hemorrhagic) thrombocythemia: Secondary | ICD-10-CM | POA: Diagnosis not present

## 2023-05-09 DIAGNOSIS — Z79899 Other long term (current) drug therapy: Secondary | ICD-10-CM | POA: Diagnosis not present

## 2023-05-09 DIAGNOSIS — D508 Other iron deficiency anemias: Secondary | ICD-10-CM | POA: Diagnosis not present

## 2023-05-09 DIAGNOSIS — D500 Iron deficiency anemia secondary to blood loss (chronic): Secondary | ICD-10-CM | POA: Diagnosis not present

## 2023-05-09 DIAGNOSIS — C641 Malignant neoplasm of right kidney, except renal pelvis: Secondary | ICD-10-CM

## 2023-05-09 DIAGNOSIS — D75839 Thrombocytosis, unspecified: Secondary | ICD-10-CM | POA: Diagnosis not present

## 2023-05-09 MED ORDER — SODIUM CHLORIDE 0.9 % IV SOLN: INTRAVENOUS | Status: DC

## 2023-05-09 MED ORDER — SODIUM CHLORIDE 0.9 % IV SOLN
300.0000 mg | Freq: Once | INTRAVENOUS | Status: AC
  Administered 2023-05-09: 300 mg via INTRAVENOUS
  Filled 2023-05-09: qty 300

## 2023-05-09 NOTE — Patient Instructions (Signed)
Iron Sucrose Injection What is this medication? IRON SUCROSE (EYE ern SOO krose) treats low levels of iron (iron deficiency anemia) in people with kidney disease. Iron is a mineral that plays an important role in making red blood cells, which carry oxygen from your lungs to the rest of your body. This medicine may be used for other purposes; ask your health care provider or pharmacist if you have questions. COMMON BRAND NAME(S): Venofer What should I tell my care team before I take this medication? They need to know if you have any of these conditions: Anemia not caused by low iron levels Heart disease High levels of iron in the blood Kidney disease Liver disease An unusual or allergic reaction to iron, other medications, foods, dyes, or preservatives Pregnant or trying to get pregnant Breastfeeding How should I use this medication? This medication is for infusion into a vein. It is given in a hospital or clinic setting. Talk to your care team about the use of this medication in children. While this medication may be prescribed for children as young as 2 years for selected conditions, precautions do apply. Overdosage: If you think you have taken too much of this medicine contact a poison control center or emergency room at once. NOTE: This medicine is only for you. Do not share this medicine with others. What if I miss a dose? Keep appointments for follow-up doses. It is important not to miss your dose. Call your care team if you are unable to keep an appointment. What may interact with this medication? Do not take this medication with any of the following: Deferoxamine Dimercaprol Other iron products This medication may also interact with the following: Chloramphenicol Deferasirox This list may not describe all possible interactions. Give your health care provider a list of all the medicines, herbs, non-prescription drugs, or dietary supplements you use. Also tell them if you smoke,  drink alcohol, or use illegal drugs. Some items may interact with your medicine. What should I watch for while using this medication? Visit your care team regularly. Tell your care team if your symptoms do not start to get better or if they get worse. You may need blood work done while you are taking this medication. You may need to follow a special diet. Talk to your care team. Foods that contain iron include: whole grains/cereals, dried fruits, beans, or peas, leafy green vegetables, and organ meats (liver, kidney). What side effects may I notice from receiving this medication? Side effects that you should report to your care team as soon as possible: Allergic reactions--skin rash, itching, hives, swelling of the face, lips, tongue, or throat Low blood pressure--dizziness, feeling faint or lightheaded, blurry vision Shortness of breath Side effects that usually do not require medical attention (report to your care team if they continue or are bothersome): Flushing Headache Joint pain Muscle pain Nausea Pain, redness, or irritation at injection site This list may not describe all possible side effects. Call your doctor for medical advice about side effects. You may report side effects to FDA at 1-800-FDA-1088. Where should I keep my medication? This medication is given in a hospital or clinic. It will not be stored at home. NOTE: This sheet is a summary. It may not cover all possible information. If you have questions about this medicine, talk to your doctor, pharmacist, or health care provider.  2024 Elsevier/Gold Standard (2022-12-07 00:00:00)

## 2023-05-16 ENCOUNTER — Inpatient Hospital Stay: Payer: Medicare HMO

## 2023-05-16 VITALS — BP 115/68 | HR 61 | Temp 98.0°F | Resp 17

## 2023-05-16 DIAGNOSIS — D508 Other iron deficiency anemias: Secondary | ICD-10-CM

## 2023-05-16 DIAGNOSIS — D75839 Thrombocytosis, unspecified: Secondary | ICD-10-CM | POA: Diagnosis not present

## 2023-05-16 DIAGNOSIS — D473 Essential (hemorrhagic) thrombocythemia: Secondary | ICD-10-CM | POA: Diagnosis not present

## 2023-05-16 DIAGNOSIS — C641 Malignant neoplasm of right kidney, except renal pelvis: Secondary | ICD-10-CM

## 2023-05-16 DIAGNOSIS — D500 Iron deficiency anemia secondary to blood loss (chronic): Secondary | ICD-10-CM | POA: Diagnosis not present

## 2023-05-16 DIAGNOSIS — Z79899 Other long term (current) drug therapy: Secondary | ICD-10-CM | POA: Diagnosis not present

## 2023-05-16 MED ORDER — SODIUM CHLORIDE 0.9 % IV SOLN
300.0000 mg | Freq: Once | INTRAVENOUS | Status: AC
  Administered 2023-05-16: 300 mg via INTRAVENOUS
  Filled 2023-05-16: qty 300

## 2023-05-16 MED ORDER — SODIUM CHLORIDE 0.9 % IV SOLN: INTRAVENOUS | Status: DC

## 2023-05-16 NOTE — Progress Notes (Signed)
Pt refused to stay for 30 minutes post iron infusion. Pt discharged to home with no complaints.

## 2023-05-16 NOTE — Patient Instructions (Signed)
Iron Sucrose Injection What is this medication? IRON SUCROSE (EYE ern SOO krose) treats low levels of iron (iron deficiency anemia) in people with kidney disease. Iron is a mineral that plays an important role in making red blood cells, which carry oxygen from your lungs to the rest of your body. This medicine may be used for other purposes; ask your health care provider or pharmacist if you have questions. COMMON BRAND NAME(S): Venofer What should I tell my care team before I take this medication? They need to know if you have any of these conditions: Anemia not caused by low iron levels Heart disease High levels of iron in the blood Kidney disease Liver disease An unusual or allergic reaction to iron, other medications, foods, dyes, or preservatives Pregnant or trying to get pregnant Breastfeeding How should I use this medication? This medication is for infusion into a vein. It is given in a hospital or clinic setting. Talk to your care team about the use of this medication in children. While this medication may be prescribed for children as young as 2 years for selected conditions, precautions do apply. Overdosage: If you think you have taken too much of this medicine contact a poison control center or emergency room at once. NOTE: This medicine is only for you. Do not share this medicine with others. What if I miss a dose? Keep appointments for follow-up doses. It is important not to miss your dose. Call your care team if you are unable to keep an appointment. What may interact with this medication? Do not take this medication with any of the following: Deferoxamine Dimercaprol Other iron products This medication may also interact with the following: Chloramphenicol Deferasirox This list may not describe all possible interactions. Give your health care provider a list of all the medicines, herbs, non-prescription drugs, or dietary supplements you use. Also tell them if you smoke,  drink alcohol, or use illegal drugs. Some items may interact with your medicine. What should I watch for while using this medication? Visit your care team regularly. Tell your care team if your symptoms do not start to get better or if they get worse. You may need blood work done while you are taking this medication. You may need to follow a special diet. Talk to your care team. Foods that contain iron include: whole grains/cereals, dried fruits, beans, or peas, leafy green vegetables, and organ meats (liver, kidney). What side effects may I notice from receiving this medication? Side effects that you should report to your care team as soon as possible: Allergic reactions--skin rash, itching, hives, swelling of the face, lips, tongue, or throat Low blood pressure--dizziness, feeling faint or lightheaded, blurry vision Shortness of breath Side effects that usually do not require medical attention (report to your care team if they continue or are bothersome): Flushing Headache Joint pain Muscle pain Nausea Pain, redness, or irritation at injection site This list may not describe all possible side effects. Call your doctor for medical advice about side effects. You may report side effects to FDA at 1-800-FDA-1088. Where should I keep my medication? This medication is given in a hospital or clinic. It will not be stored at home. NOTE: This sheet is a summary. It may not cover all possible information. If you have questions about this medicine, talk to your doctor, pharmacist, or health care provider.  2024 Elsevier/Gold Standard (2022-12-07 00:00:00)

## 2023-05-23 ENCOUNTER — Inpatient Hospital Stay: Payer: Medicare HMO | Attending: Hematology & Oncology

## 2023-05-23 VITALS — BP 128/69 | HR 62 | Temp 98.1°F | Resp 20

## 2023-05-23 DIAGNOSIS — D508 Other iron deficiency anemias: Secondary | ICD-10-CM | POA: Diagnosis not present

## 2023-05-23 DIAGNOSIS — Z79899 Other long term (current) drug therapy: Secondary | ICD-10-CM | POA: Diagnosis not present

## 2023-05-23 DIAGNOSIS — D75839 Thrombocytosis, unspecified: Secondary | ICD-10-CM | POA: Diagnosis not present

## 2023-05-23 DIAGNOSIS — D473 Essential (hemorrhagic) thrombocythemia: Secondary | ICD-10-CM | POA: Diagnosis not present

## 2023-05-23 DIAGNOSIS — C641 Malignant neoplasm of right kidney, except renal pelvis: Secondary | ICD-10-CM

## 2023-05-23 MED ORDER — SODIUM CHLORIDE 0.9 % IV SOLN
INTRAVENOUS | Status: DC
Start: 2023-05-23 — End: 2023-05-23

## 2023-05-23 MED ORDER — SODIUM CHLORIDE 0.9 % IV SOLN
300.0000 mg | Freq: Once | INTRAVENOUS | Status: AC
  Administered 2023-05-23: 300 mg via INTRAVENOUS
  Filled 2023-05-23: qty 300

## 2023-05-23 NOTE — Patient Instructions (Signed)
Iron Sucrose Injection What is this medication? IRON SUCROSE (EYE ern SOO krose) treats low levels of iron (iron deficiency anemia) in people with kidney disease. Iron is a mineral that plays an important role in making red blood cells, which carry oxygen from your lungs to the rest of your body. This medicine may be used for other purposes; ask your health care provider or pharmacist if you have questions. COMMON BRAND NAME(S): Venofer What should I tell my care team before I take this medication? They need to know if you have any of these conditions: Anemia not caused by low iron levels Heart disease High levels of iron in the blood Kidney disease Liver disease An unusual or allergic reaction to iron, other medications, foods, dyes, or preservatives Pregnant or trying to get pregnant Breastfeeding How should I use this medication? This medication is for infusion into a vein. It is given in a hospital or clinic setting. Talk to your care team about the use of this medication in children. While this medication may be prescribed for children as young as 2 years for selected conditions, precautions do apply. Overdosage: If you think you have taken too much of this medicine contact a poison control center or emergency room at once. NOTE: This medicine is only for you. Do not share this medicine with others. What if I miss a dose? Keep appointments for follow-up doses. It is important not to miss your dose. Call your care team if you are unable to keep an appointment. What may interact with this medication? Do not take this medication with any of the following: Deferoxamine Dimercaprol Other iron products This medication may also interact with the following: Chloramphenicol Deferasirox This list may not describe all possible interactions. Give your health care provider a list of all the medicines, herbs, non-prescription drugs, or dietary supplements you use. Also tell them if you smoke,  drink alcohol, or use illegal drugs. Some items may interact with your medicine. What should I watch for while using this medication? Visit your care team regularly. Tell your care team if your symptoms do not start to get better or if they get worse. You may need blood work done while you are taking this medication. You may need to follow a special diet. Talk to your care team. Foods that contain iron include: whole grains/cereals, dried fruits, beans, or peas, leafy green vegetables, and organ meats (liver, kidney). What side effects may I notice from receiving this medication? Side effects that you should report to your care team as soon as possible: Allergic reactions--skin rash, itching, hives, swelling of the face, lips, tongue, or throat Low blood pressure--dizziness, feeling faint or lightheaded, blurry vision Shortness of breath Side effects that usually do not require medical attention (report to your care team if they continue or are bothersome): Flushing Headache Joint pain Muscle pain Nausea Pain, redness, or irritation at injection site This list may not describe all possible side effects. Call your doctor for medical advice about side effects. You may report side effects to FDA at 1-800-FDA-1088. Where should I keep my medication? This medication is given in a hospital or clinic. It will not be stored at home. NOTE: This sheet is a summary. It may not cover all possible information. If you have questions about this medicine, talk to your doctor, pharmacist, or health care provider.  2024 Elsevier/Gold Standard (2022-12-07 00:00:00)

## 2023-06-20 ENCOUNTER — Encounter: Payer: Self-pay | Admitting: Cardiology

## 2023-06-20 ENCOUNTER — Ambulatory Visit: Payer: Medicare HMO | Attending: Cardiology | Admitting: Cardiology

## 2023-06-20 ENCOUNTER — Other Ambulatory Visit: Payer: Self-pay | Admitting: Cardiology

## 2023-06-20 ENCOUNTER — Other Ambulatory Visit (HOSPITAL_BASED_OUTPATIENT_CLINIC_OR_DEPARTMENT_OTHER): Payer: Self-pay

## 2023-06-20 VITALS — BP 131/80 | HR 80 | Ht 63.0 in | Wt 209.0 lb

## 2023-06-20 DIAGNOSIS — I493 Ventricular premature depolarization: Secondary | ICD-10-CM | POA: Diagnosis not present

## 2023-06-20 DIAGNOSIS — E785 Hyperlipidemia, unspecified: Secondary | ICD-10-CM

## 2023-06-20 DIAGNOSIS — I471 Supraventricular tachycardia, unspecified: Secondary | ICD-10-CM | POA: Diagnosis not present

## 2023-06-20 DIAGNOSIS — R0609 Other forms of dyspnea: Secondary | ICD-10-CM

## 2023-06-20 DIAGNOSIS — I10 Essential (primary) hypertension: Secondary | ICD-10-CM

## 2023-06-20 HISTORY — DX: Hyperlipidemia, unspecified: E78.5

## 2023-06-20 MED ORDER — ROSUVASTATIN CALCIUM 20 MG PO TABS
20.0000 mg | ORAL_TABLET | Freq: Every day | ORAL | 3 refills | Status: DC
  Filled 2023-06-20: qty 90, 90d supply, fill #0
  Filled 2023-09-11: qty 90, 90d supply, fill #1
  Filled 2023-12-10: qty 90, 90d supply, fill #2
  Filled 2024-03-09: qty 90, 90d supply, fill #3

## 2023-06-20 NOTE — Progress Notes (Signed)
Cardiology Office Note:    Date:  06/20/2023   ID:  Karen Wall, DOB [birth year], MRN [id number]  PCP:  Pearline Cables, MD  Cardiologist:  Gypsy Balsam, MD    Referring MD: Pearline Cables, MD   Chief Complaint  Patient presents with   Follow-up    History of Present Illness:    Karen Wall is a 67 y.o. female past medical history significant for supraventricular tachycardia, frequent ventricular ectopy with burden of 16.6% 2 years ago, history of nephrectomy secondary to cancer, coronary CT angio done in April of this year showing minimal disease, calcium score 47.2.  She comes today to talk about it.  Overall she says she is doing fine still have some palpitations bother her somewhat.  And she is worried about potentially having some heart issue.  Denies have any chest pain tightness squeezing pressure burning chest no dizziness no passing out.  She was also noted to have severe iron deficiency anemia she try oral supplementation did not work eventually end up getting IV infusions.  That make her feel better what triggered this investigation with shortness of breath still I think this will be reasonable to do echocardiogram to make sure ejection fraction is preserved  Past Medical History:  Diagnosis Date   A-fib [facility])    history of   Acquired flat foot    Ankle pain    bilateral   Arthritis    feet ankles, and knees    Cancer of kidney parenchyma, right (HCC) 09/08/2018   Depression    Diverticulosis    Dyspnea    with exertion    Dysrhythmia    hx of afib with hyperthroid no issues currently    GERD (gastroesophageal reflux disease)    H/O hiatal hernia 08/09/2018   Moderate to Large   Hyperlipidemia    Hypertension    Hypothyroidism    Iron deficiency anemia    iron deficiency last iron infusion 08/2018   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   Numbness    Left side of face around mouth - unknown etiology   Numbness and tingling    left side - facial    Palpitations    Occurred during hyperthyroid   PONV (postoperative nausea and vomiting)    Right renal mass 07/2018   2.1 cm solid heterogeneously enhancing mass in posterior midpole    Sinus tachycardia by electrocardiogram 05/12/2018   Thrombocythemia    Thrombocytosis 11/22/2017   Thyroid cancer (HCC)    Thyroid disease    hyperthyroidism   Vitamin B 12 deficiency    Vitamin D deficiency     Past Surgical History:  Procedure Laterality Date   BREAST BIOPSY     age 41 - negative   CHOLECYSTECTOMY  1992   OPERATIVE ULTRASOUND Right 09/25/2018   Procedure: OPERATIVE ULTRASOUND;  Surgeon: Heloise Purpura, MD;  Location: [facility];  Service: Urology;  Laterality: Right;   ROBOTIC ASSITED PARTIAL NEPHRECTOMY Right 09/25/2018   Procedure: XI ROBOTIC RIGHT  ASSITED PARTIAL NEPHRECTOMY;  Surgeon: Heloise Purpura, MD;  Location: [facility];  Service: Urology;  Laterality: Right;   THYROIDECTOMY N/A 07/03/2013   Procedure: TOTAL THYROIDECTOMY;  Surgeon: Velora Heckler, MD;  Location: [facility];  Service: General;  Laterality: N/A;    Current Medications: Current Meds  Medication Sig   acetaminophen (TYLENOL) 500 MG tablet Take 500 mg by mouth every 6 (six) hours as needed for pain.   aspirin 81 MG EC tablet Take  81 mg by mouth daily. Swallow whole.   Cholecalciferol 125 MCG (5000 UT) capsule Take 5,000 Units by mouth once a week.   Cyanocobalamin 2500 MCG SUBL Place 1 tablet under the tongue every other day.   diltiazem (CARDIZEM CD) 180 MG 24 hr capsule Take 1 capsule (180 mg total) by mouth daily.   FLUoxetine (PROZAC) 20 MG capsule Alternate between taking 1 capsule (20 mg total) by mouth daily and 2 capsules (40 mg total) by mouth daily.   levothyroxine (SYNTHROID) 100 MCG tablet Take 1 tablet (100 mcg total) by mouth daily.   levothyroxine (SYNTHROID) 88 MCG tablet Take 1 tablet (88 mcg total) by mouth every other day. Alternate with the dose.   LORazepam (ATIVAN) 1 MG tablet Take 1  tablet (1 mg total) by mouth as needed for anxiety or sleep.   lovastatin (MEVACOR) 40 MG tablet Take 1 tablet (40 mg total) by mouth at bedtime.   metroNIDAZOLE (METROGEL) 1 % gel Apply topically daily as needed for facial rash.     Allergies:   Penicillins   Social History   Socioeconomic History   Marital status: Divorced    Spouse name: Not on file   Number of children: 1   Years of education: college   Highest education level: Associate degree: occupational, Scientist, product/process development, or vocational program  Occupational History   Occupation: Charity fundraiser  Tobacco Use   Smoking status: Never   Smokeless tobacco: Never  Vaping Use   Vaping status: Never Used  Substance and Sexual Activity   Alcohol use: No    Alcohol/week: 0.0 standard drinks of alcohol   Drug use: No   Sexual activity: Never  Other Topics Concern   Not on file  Social History Narrative   Lives at home alone.   Right-handed.   2 cups caffeine daily.   Social Determinants of Health   Financial Resource Strain: Low Risk  (10/08/2022)   Overall Financial Resource Strain (CARDIA)    Difficulty of Paying Living Expenses: Not very hard  Food Insecurity: No Food Insecurity (10/08/2022)   Hunger Vital Sign    Worried About Running Out of Food in the Last Year: Never true    Ran Out of Food in the Last Year: Never true  Transportation Needs: No Transportation Needs (10/08/2022)   PRAPARE - Administrator, Civil Service (Medical): No    Lack of Transportation (Non-Medical): No  Physical Activity: Insufficiently Active (10/08/2022)   Exercise Vital Sign    Days of Exercise per Week: 2 days    Minutes of Exercise per Session: 20 min  Stress: Stress Concern Present (10/08/2022)   [employer] of Occupational Health - Occupational Stress Questionnaire    Feeling of Stress : Rather much  Social Connections: Unknown (10/08/2022)   Social Connection and Isolation Panel [NHANES]    Frequency of Communication with Friends and  Family: More than three times a week    Frequency of Social Gatherings with Friends and Family: Twice a week    Attends Religious Services: Patient declined    Database administrator or Organizations: No    Attends Engineer, structural: Not on file    Marital Status: Divorced     Family History: The patient's family history includes CAD in her father; COPD in her mother; Congestive Heart Failure in her paternal grandmother; Diabetes in her father, maternal grandmother, and mother; Heart attack in her maternal grandfather and paternal grandfather; Hyperlipidemia in her father  and mother; Hypertension in her father and mother; Mental illness in her father and maternal grandmother; Stroke in her maternal grandfather and maternal grandmother. ROS:   Please see the history of present illness.    All 14 point review of systems negative except as described per history of present illness  EKGs/Labs/Other Studies Reviewed:         Recent Labs: 08/02/2022: TSH 1.42 05/01/2023: ALT 11; BUN 14; Creatinine 0.81; Hemoglobin 11.7; Platelet Count 572; Potassium 5.3; Sodium 141  Recent Lipid Panel    Component Value Date/Time   CHOL 236 (H) 10/11/2022 1353   TRIG 100 10/11/2022 1353   HDL 61 10/11/2022 1353   CHOLHDL 3.9 10/11/2022 1353   CHOLHDL 3 09/07/2021 1137   VLDL 16.0 09/07/2021 1137   LDLCALC 157 (H) 10/11/2022 1353   LDLCALC 123 (H) 09/02/2019 1511    Physical Exam:    VS:  BP 131/80 (BP Location: Right Arm, Patient Position: Sitting, Cuff Size: Normal)   Pulse 80   Ht 5\' 3"  (1.6 m)   Wt 209 lb (94.8 kg)   SpO2 98%   BMI 37.02 kg/m     Wt Readings from Last 3 Encounters:  06/20/23 209 lb (94.8 kg)  05/01/23 210 lb (95.3 kg)  10/25/22 213 lb (96.6 kg)     GEN:  Well nourished, well developed in no acute distress HEENT: Normal NECK: No JVD; No carotid bruits LYMPHATICS: No lymphadenopathy CARDIAC: RRR, no murmurs, no rubs, no gallops RESPIRATORY:  Clear to  auscultation without rales, wheezing or rhonchi  ABDOMEN: Soft, non-tender, non-distended MUSCULOSKELETAL:  No edema; No deformity  SKIN: Warm and dry LOWER EXTREMITIES: no swelling NEUROLOGIC:  Alert and oriented x 3 PSYCHIATRIC:  Normal affect   ASSESSMENT:    1. Essential hypertension   2. Ventricular ectopy   3. Supraventricular tachycardia (HCC)   4. Dyslipidemia    PLAN:    In order of problems listed above:  Ventricular ectopy.  Will ask her to have echocardiogram to see ejection fraction if ejection fraction is diminished then we will reinitiate beta-blockers.  Previously we try metoprolol she felt lousy on it so in the future we may try carvedilol.  If her ejection fraction is preserved then probably easiest maneuver will be to increase dose of Cardizem. Essential hypertension: Blood pressure well-controlled. Dyslipidemia knowing her calcium score I did a calculation of her 10 years risk which is 9.3%.  She is taking Mevacor I will switch her to Crestor 8 will be Crestor 20 that should get is what we want to be.  Fasting lipid profile 6 weeks   Medication Adjustments/Labs and Tests Ordered: Current medicines are reviewed at length with the patient today.  Concerns regarding medicines are outlined above.  No orders of the defined types were placed in this encounter.  Medication changes: No orders of the defined types were placed in this encounter.   Signed, Georgeanna Lea, MD, [facility] 06/20/2023 11:46 AM    [facility]

## 2023-06-20 NOTE — Patient Instructions (Addendum)
Medication Instructions:   STOP: Lovastatin  START: Crestor 20mg  1 tablet daily   Lab Work: 3rd Floor   [address]  Your physician recommends that you return for lab work in:   6 weeks You need to have labs done when you are fasting.  You can come Monday through Friday 8:00 am to 11:30AM and 1:00 to 4:00. You do not need to make an appointment as the order has already been placed.    Testing/Procedures: Your physician has requested that you have an echocardiogram. Echocardiography is a painless test that uses sound waves to create images of your heart. It provides your doctor with information about the size and shape of your heart and how well your heart's chambers and valves are working. This procedure takes approximately one hour. There are no restrictions for this procedure. Please do NOT wear cologne, perfume, aftershave, or lotions (deodorant is allowed). Please arrive 15 minutes prior to your appointment time.  Please note: We ask at that you not bring children with you during ultrasound (echo/ vascular) testing. Due to room size and safety concerns, children are not allowed in the ultrasound rooms during exams. Our front office staff cannot provide observation of children in our lobby area while testing is being conducted. An adult accompanying a patient to their appointment will only be allowed in the ultrasound room at the discretion of the ultrasound technician under special circumstances. We apologize for any inconvenience.    Follow-Up: At [facility], you and your health needs are our priority.  As part of our continuing mission to provide you with exceptional heart care, we have created designated Provider Care Teams.  These Care Teams include your primary Cardiologist (physician) and Advanced Practice Providers (APPs -  Physician Assistants and Nurse Practitioners) who all work together to provide you with the care you need, when you need it.  We recommend signing up for the  patient portal called "MyChart".  Sign up information is provided on this After Visit Summary.  MyChart is used to connect with patients for Virtual Visits (Telemedicine).  Patients are able to view lab/test results, encounter notes, upcoming appointments, etc.  Non-urgent messages can be sent to your provider as well.   To learn more about what you can do with MyChart, go to [email or web address].    Your next appointment:   6 month(s)  The format for your next appointment:   In Person  Provider:   Gypsy Balsam, MD    Other Instructions NA

## 2023-06-21 ENCOUNTER — Other Ambulatory Visit (HOSPITAL_BASED_OUTPATIENT_CLINIC_OR_DEPARTMENT_OTHER): Payer: Self-pay

## 2023-06-21 MED ORDER — DILTIAZEM HCL ER COATED BEADS 180 MG PO CP24
180.0000 mg | ORAL_CAPSULE | Freq: Every day | ORAL | 3 refills | Status: DC
  Filled 2023-06-21: qty 90, 90d supply, fill #0
  Filled 2023-09-10: qty 90, 90d supply, fill #1
  Filled 2023-12-16: qty 90, 90d supply, fill #2

## 2023-06-25 ENCOUNTER — Ambulatory Visit (INDEPENDENT_AMBULATORY_CARE_PROVIDER_SITE_OTHER): Payer: Medicare HMO

## 2023-06-25 VITALS — Ht 63.0 in | Wt 209.0 lb

## 2023-06-25 DIAGNOSIS — Z0000 Encounter for general adult medical examination without abnormal findings: Secondary | ICD-10-CM

## 2023-06-25 NOTE — Patient Instructions (Addendum)
Karen Wall , Thank you for taking time to come for your Medicare Wellness Visit. I appreciate your ongoing commitment to your health goals. Please review the following plan we discussed and let me know if I can assist you in the future.   Referrals/Orders/Follow-Ups/Clinician Recommendations:   This is a list of the screening recommended for you and due dates:  Health Maintenance  Topic Date Due   Zoster (Shingles) Vaccine (1 of 2) Never done   Flu Shot  02/14/2023   COVID-19 Vaccine (4 - 2023-24 season) 03/17/2023   Medicare Annual Wellness Visit  06/24/2024   Mammogram  10/24/2024   Cologuard (Stool DNA test)  04/10/2025   DTaP/Tdap/Td vaccine (2 - Td or Tdap) 09/01/2028   Pneumonia Vaccine  Completed   DEXA scan (bone density measurement)  Completed   Hepatitis C Screening  Completed   HPV Vaccine  Aged Out    Advanced directives: (Declined) Advance directive discussed with you today. Even though you declined this today, please call our office should you change your mind, and we can give you the proper paperwork for you to fill out.  Next Medicare Annual Wellness Visit scheduled for next year: Yes

## 2023-06-25 NOTE — Progress Notes (Signed)
Subjective:   Karen Wall is a 67 y.o. female who presents for Medicare Annual (Subsequent) preventive examination.  Visit Complete: Virtual I connected with  Karen Wall on 06/25/23 by a audio enabled telemedicine application and verified that I am speaking with the correct person using two identifiers.  Patient Location: Home  Provider Location: Home Office  I discussed the limitations of evaluation and management by telemedicine. The patient expressed understanding and agreed to proceed.  Vital Signs: Because this visit was a virtual/telehealth visit, some criteria may be missing or patient reported. Any vitals not documented were not able to be obtained and vitals that have been documented are patient reported.  Patient Medicare AWV questionnaire was completed by the patient on 06/24/23; I have confirmed that all information answered by patient is correct and no changes since this date.  Cardiac Risk Factors include: advanced age (>60men, >54 women);hypertension;dyslipidemia     Objective:    Today's Vitals   06/25/23 1018  Weight: 209 lb (94.8 kg)  Height: 5\' 3"  (1.6 m)   Body mass index is 37.02 kg/m.     06/25/2023   10:25 AM 05/01/2023   11:14 AM 10/25/2022   10:25 AM 06/11/2022   11:02 AM 04/26/2022   11:09 AM 11/24/2021   10:35 AM 07/25/2021   11:44 AM  Advanced Directives  Does Patient Have a Medical Advance Directive? No No No No No No No  Would patient like information on creating a medical advance directive? No - Patient declined No - Patient declined No - Patient declined No - Patient declined No - Patient declined No - Patient declined No - Patient declined    Current Medications (verified) Outpatient Encounter Medications as of 06/25/2023  Medication Sig   acetaminophen (TYLENOL) 500 MG tablet Take 500 mg by mouth every 6 (six) hours as needed for pain.   aspirin 81 MG EC tablet Take 81 mg by mouth daily. Swallow whole.   Cholecalciferol  125 MCG (5000 UT) capsule Take 5,000 Units by mouth once a week.   Cyanocobalamin 2500 MCG SUBL Place 1 tablet under the tongue every other day.   diltiazem (CARDIZEM CD) 180 MG 24 hr capsule Take 1 capsule (180 mg total) by mouth daily.   FLUoxetine (PROZAC) 20 MG capsule Alternate between taking 1 capsule (20 mg total) by mouth daily and 2 capsules (40 mg total) by mouth daily.   hydroxyurea (HYDREA) 500 MG capsule Take 1 capsule (500 mg total) by mouth daily. May take with food to minimize GI side effects. (Patient not taking: Reported on 10/25/2022)   levothyroxine (SYNTHROID) 100 MCG tablet Take 1 tablet (100 mcg total) by mouth daily.   levothyroxine (SYNTHROID) 88 MCG tablet Take 1 tablet (88 mcg total) by mouth every other day. Alternate with the dose.   LORazepam (ATIVAN) 1 MG tablet Take 1 tablet (1 mg total) by mouth as needed for anxiety or sleep.   metroNIDAZOLE (METROGEL) 1 % gel Apply topically daily as needed for facial rash.   rosuvastatin (CRESTOR) 20 MG tablet Take 1 tablet (20 mg total) by mouth daily.   No facility-administered encounter medications on file as of 06/25/2023.    Allergies (verified) Penicillins   History: Past Medical History:  Diagnosis Date   A-fib (HCC)    history of   Acquired flat foot    Ankle pain    bilateral   Arthritis    feet ankles, and knees    Cancer of  kidney parenchyma, right (HCC) 09/08/2018   Depression    Diverticulosis    Dyspnea    with exertion    Dysrhythmia    hx of afib with hyperthroid no issues currently    GERD (gastroesophageal reflux disease)    H/O hiatal hernia 08/09/2018   Moderate to Large   Hyperlipidemia    Hypertension    Hypothyroidism    Iron deficiency anemia    iron deficiency last iron infusion 08/2018   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   Numbness    Left side of face around mouth - unknown etiology   Numbness and tingling    left side - facial   Palpitations    Occurred during hyperthyroid    PONV (postoperative nausea and vomiting)    Right renal mass 07/2018   2.1 cm solid heterogeneously enhancing mass in posterior midpole    Sinus tachycardia by electrocardiogram 05/12/2018   Thrombocythemia    Thrombocytosis 11/22/2017   Thyroid cancer (HCC)    Thyroid disease    hyperthyroidism   Vitamin B 12 deficiency    Vitamin D deficiency    Past Surgical History:  Procedure Laterality Date   BREAST BIOPSY     age 16 - negative   CHOLECYSTECTOMY  1992   OPERATIVE ULTRASOUND Right 09/25/2018   Procedure: OPERATIVE ULTRASOUND;  Surgeon: Heloise Purpura, MD;  Location: [facility];  Service: Urology;  Laterality: Right;   ROBOTIC ASSITED PARTIAL NEPHRECTOMY Right 09/25/2018   Procedure: XI ROBOTIC RIGHT  ASSITED PARTIAL NEPHRECTOMY;  Surgeon: Heloise Purpura, MD;  Location: [facility];  Service: Urology;  Laterality: Right;   THYROIDECTOMY N/A 07/03/2013   Procedure: TOTAL THYROIDECTOMY;  Surgeon: Velora Heckler, MD;  Location: [facility];  Service: General;  Laterality: N/A;   Family History  Problem Relation Age of Onset   Hypertension Mother    Diabetes Mother    Hyperlipidemia Mother    COPD Mother    CAD Father        MI in his 75s   Diabetes Father    Hyperlipidemia Father    Hypertension Father    Mental illness Father    Mental illness Maternal Grandmother    Stroke Maternal Grandmother    Diabetes Maternal Grandmother    Stroke Maternal Grandfather    Heart attack Maternal Grandfather    Congestive Heart Failure Paternal Grandmother    Heart attack Paternal Grandfather    Social History   Socioeconomic History   Marital status: Divorced    Spouse name: Not on file   Number of children: 1   Years of education: college   Highest education level: Associate degree: occupational, Scientist, product/process development, or vocational program  Occupational History   Occupation: Charity fundraiser  Tobacco Use   Smoking status: Never   Smokeless tobacco: Never  Vaping Use   Vaping status: Never Used  Substance and  Sexual Activity   Alcohol use: No    Alcohol/week: 0.0 standard drinks of alcohol   Drug use: No   Sexual activity: Never  Other Topics Concern   Not on file  Social History Narrative   Lives at home alone.   Right-handed.   2 cups caffeine daily.   Social Determinants of Health   Financial Resource Strain: Low Risk  (06/24/2023)   Overall Financial Resource Strain (CARDIA)    Difficulty of Paying Living Expenses: Not very hard  Food Insecurity: No Food Insecurity (06/24/2023)   Hunger Vital Sign    Worried About Running Out of  Food in the Last Year: Never true    Ran Out of Food in the Last Year: Never true  Transportation Needs: No Transportation Needs (06/24/2023)   PRAPARE - Administrator, Civil Service (Medical): No    Lack of Transportation (Non-Medical): No  Physical Activity: Sufficiently Active (06/24/2023)   Exercise Vital Sign    Days of Exercise per Week: 5 days    Minutes of Exercise per Session: 30 min  Stress: Stress Concern Present (06/24/2023)   [employer] of Occupational Health - Occupational Stress Questionnaire    Feeling of Stress : Rather much  Social Connections: Unknown (06/24/2023)   Social Connection and Isolation Panel [NHANES]    Frequency of Communication with Friends and Family: More than three times a week    Frequency of Social Gatherings with Friends and Family: Three times a week    Attends Religious Services: Patient declined    Active Member of Clubs or Organizations: No    Attends Banker Meetings: Never    Marital Status: Divorced    Tobacco Counseling Counseling given: Not Answered   Clinical Intake:  Pre-visit preparation completed: Yes  Pain : No/denies pain     BMI - recorded: 37.02 Nutritional Status: BMI > 30  Obese Nutritional Risks: None Diabetes: No  How often do you need to have someone help you when you read instructions, pamphlets, or other written materials from your doctor or  pharmacy?: 1 - Never  Interpreter Needed?: No  Information entered by :: Theresa Mulligan LPN   Activities of Daily Living    06/24/2023   10:26 AM  In your present state of health, do you have any difficulty performing the following activities:  Hearing? 0  Vision? 0  Difficulty concentrating or making decisions? 1  Walking or climbing stairs? 1  Dressing or bathing? 0  Doing errands, shopping? 0  Preparing Food and eating ? N  Using the Toilet? N  In the past six months, have you accidently leaked urine? Y  Comment Followed by medical attention  Do you have problems with loss of bowel control? N  Managing your Medications? N  Managing your Finances? N  Housekeeping or managing your Housekeeping? N    Patient Care Team: Copland, Gwenlyn Found, MD as PCP - General (Family Medicine) Karen Kitchens as Physician Assistant (Medical Oncology) Georgeanna Lea, MD as Consulting Physician (Cardiology)  Indicate any recent Medical Services you may have received from other than [facility] providers in the past year (date may be approximate).     Assessment:   This is a routine wellness examination for Karen Wall.  Hearing/Vision screen Hearing Screening - Comments:: Denies hearing difficulties   Vision Screening - Comments:: Wears rx glasses - up to date with routine eye exams with  Dr Malachi Bonds   Goals Addressed               This Visit's Progress     Patient Stated (pt-stated)        Would like to lose some weight.       Depression Screen    06/25/2023   10:31 AM 06/11/2022   11:03 AM 03/07/2022   10:51 AM 09/07/2021   11:15 AM 06/06/2021    1:08 PM 03/03/2021    3:29 PM 02/29/2020   10:43 AM  PHQ 2/9 Scores  PHQ - 2 Score 0 3 3 3 1 2 5   PHQ- 9 Score  9 3 15  17    Fall Risk    06/25/2023   10:24 AM 06/24/2023   10:26 AM 06/11/2022   11:07 AM 03/07/2022   10:50 AM 09/07/2021   10:47 AM  Fall Risk   Falls in the past year? 1 1 1  0 0  Number falls in  past yr: 0 1 0 0 0  Injury with Fall? 0 0 0 0 0  Risk for fall due to : No Fall Risks  No Fall Risks    Follow up Falls prevention discussed  Falls evaluation completed      MEDICARE RISK AT HOME: Medicare Risk at Home Any stairs in or around the home?: Yes If so, are there any without handrails?: Yes Home free of loose throw rugs in walkways, pet beds, electrical cords, etc?: Yes Adequate lighting in your home to reduce risk of falls?: Yes Life alert?: No Use of a cane, walker or w/c?: No Grab bars in the bathroom?: No Shower chair or bench in shower?: Yes Elevated toilet seat or a handicapped toilet?: No  TIMED UP AND GO:  Was the test performed?  No    Cognitive Function:        06/25/2023   10:25 AM 06/11/2022   11:12 AM  6CIT Screen  What Year? 0 points 0 points  What month? 0 points 0 points  What time? 0 points 0 points  Count back from 20 0 points 0 points  Months in reverse 0 points 0 points  Repeat phrase 0 points 0 points  Total Score 0 points 0 points    Immunizations Immunization History  Administered Date(s) Administered   Influenza,inj,Quad PF,6+ Mos 04/19/2014, 04/15/2016, 05/12/2018, 04/16/2019   Influenza-Unspecified 04/15/2013, 03/17/2015, 04/24/2017, 05/05/2020   PFIZER(Purple Top)SARS-COV-2 Vaccination 09/23/2019, 10/13/2019, 05/05/2020   PNEUMOCOCCAL CONJUGATE-20 03/01/2021   Tdap 09/01/2018    TDAP status: Up to date  Flu Vaccine status: Due, Education has been provided regarding the importance of this vaccine. Advised may receive this vaccine at local pharmacy or Health Dept. Aware to provide a copy of the vaccination record if obtained from local pharmacy or Health Dept. Verbalized acceptance and understanding.  Pneumococcal vaccine status: Up to date  Covid-19 vaccine status: Declined, Education has been provided regarding the importance of this vaccine but patient still declined. Advised may receive this vaccine at local pharmacy or  Health Dept.or vaccine clinic. Aware to provide a copy of the vaccination record if obtained from local pharmacy or Health Dept. Verbalized acceptance and understanding.  Qualifies for Shingles Vaccine? Yes   Zostavax completed No   Shingrix Completed?: No.    Education has been provided regarding the importance of this vaccine. Patient has been advised to call insurance company to determine out of pocket expense if they have not yet received this vaccine. Advised may also receive vaccine at local pharmacy or Health Dept. Verbalized acceptance and understanding.  Screening Tests Health Maintenance  Topic Date Due   Zoster Vaccines- Shingrix (1 of 2) Never done   INFLUENZA VACCINE  02/14/2023   COVID-19 Vaccine (4 - 2023-24 season) 03/17/2023   Medicare Annual Wellness (AWV)  06/24/2024   MAMMOGRAM  10/24/2024   Fecal DNA (Cologuard)  04/10/2025   DTaP/Tdap/Td (2 - Td or Tdap) 09/01/2028   Pneumonia Vaccine 82+ Years old  Completed   DEXA SCAN  Completed   Hepatitis C Screening  Completed   HPV VACCINES  Aged Out    Health Maintenance  Health Maintenance Due  Topic Date  Due   Zoster Vaccines- Shingrix (1 of 2) Never done   INFLUENZA VACCINE  02/14/2023   COVID-19 Vaccine (4 - 2023-24 season) 03/17/2023    Colorectal cancer screening: Type of screening: Cologuard. Completed 04/10/22. Repeat every 3 years  Mammogram status: Completed 10/25/22. Repeat every year  Bone Density status: Completed 05/03/22. Results reflect: Bone density results: NORMAL. Repeat every   years.    Additional Screening:  Hepatitis C Screening: does qualify; Completed 05/18/15  Vision Screening: Recommended annual ophthalmology exams for early detection of glaucoma and other disorders of the eye. Is the patient up to date with their annual eye exam?  Yes  Who is the provider or what is the name of the office in which the patient attends annual eye exams? Dr Malachi Bonds If pt is not established with a  provider, would they like to be referred to a provider to establish care? No .   Dental Screening: Recommended annual dental exams for proper oral hygiene   Community Resource Referral / Chronic Care Management:  CRR required this visit?  No   CCM required this visit?  No     Plan:     I have personally reviewed and noted the following in the patient's chart:   Medical and social history Use of alcohol, tobacco or illicit drugs  Current medications and supplements including opioid prescriptions. Patient is not currently taking opioid prescriptions. Functional ability and status Nutritional status Physical activity Advanced directives List of other physicians Hospitalizations, surgeries, and ER visits in previous 12 months Vitals Screenings to include cognitive, depression, and falls Referrals and appointments  In addition, I have reviewed and discussed with patient certain preventive protocols, quality metrics, and best practice recommendations. A written personalized care plan for preventive services as well as general preventive health recommendations were provided to patient.     Tillie Rung, LPN   [id number]/04/[id number]   After Visit Summary: (MyChart) Due to this being a telephonic visit, the after visit summary with patients personalized plan was offered to patient via MyChart   Nurse Notes: None

## 2023-07-04 ENCOUNTER — Inpatient Hospital Stay: Payer: Medicare HMO | Admitting: Medical Oncology

## 2023-07-04 ENCOUNTER — Encounter: Payer: Self-pay | Admitting: Medical Oncology

## 2023-07-04 ENCOUNTER — Inpatient Hospital Stay: Payer: Medicare HMO | Attending: Family

## 2023-07-04 VITALS — BP 110/69 | HR 67 | Temp 99.1°F | Resp 18 | Ht 63.0 in | Wt 208.8 lb

## 2023-07-04 DIAGNOSIS — C641 Malignant neoplasm of right kidney, except renal pelvis: Secondary | ICD-10-CM

## 2023-07-04 DIAGNOSIS — D75839 Thrombocytosis, unspecified: Secondary | ICD-10-CM | POA: Diagnosis not present

## 2023-07-04 DIAGNOSIS — Z7982 Long term (current) use of aspirin: Secondary | ICD-10-CM | POA: Diagnosis not present

## 2023-07-04 DIAGNOSIS — Z79899 Other long term (current) drug therapy: Secondary | ICD-10-CM | POA: Diagnosis not present

## 2023-07-04 DIAGNOSIS — D508 Other iron deficiency anemias: Secondary | ICD-10-CM

## 2023-07-04 DIAGNOSIS — D473 Essential (hemorrhagic) thrombocythemia: Secondary | ICD-10-CM | POA: Insufficient documentation

## 2023-07-04 DIAGNOSIS — D500 Iron deficiency anemia secondary to blood loss (chronic): Secondary | ICD-10-CM

## 2023-07-04 DIAGNOSIS — K909 Intestinal malabsorption, unspecified: Secondary | ICD-10-CM

## 2023-07-04 LAB — CBC WITH DIFFERENTIAL (CANCER CENTER ONLY)
Abs Immature Granulocytes: 0.02 10*3/uL (ref 0.00–0.07)
Basophils Absolute: 0.1 10*3/uL (ref 0.0–0.1)
Basophils Relative: 1 %
Eosinophils Absolute: 0.1 10*3/uL (ref 0.0–0.5)
Eosinophils Relative: 1 %
HCT: 44.8 % (ref 36.0–46.0)
Hemoglobin: 14 g/dL (ref 12.0–15.0)
Immature Granulocytes: 0 %
Lymphocytes Relative: 29 %
Lymphs Abs: 2.7 10*3/uL (ref 0.7–4.0)
MCH: 25.6 pg — ABNORMAL LOW (ref 26.0–34.0)
MCHC: 31.3 g/dL (ref 30.0–36.0)
MCV: 81.9 fL (ref 80.0–100.0)
Monocytes Absolute: 0.7 10*3/uL (ref 0.1–1.0)
Monocytes Relative: 7 %
Neutro Abs: 5.6 10*3/uL (ref 1.7–7.7)
Neutrophils Relative %: 62 %
Platelet Count: 461 10*3/uL — ABNORMAL HIGH (ref 150–400)
RBC: 5.47 MIL/uL — ABNORMAL HIGH (ref 3.87–5.11)
RDW: 21.2 % — ABNORMAL HIGH (ref 11.5–15.5)
WBC Count: 9.1 10*3/uL (ref 4.0–10.5)
nRBC: 0 % (ref 0.0–0.2)

## 2023-07-04 LAB — FERRITIN: Ferritin: 83 ng/mL (ref 11–307)

## 2023-07-04 LAB — CMP (CANCER CENTER ONLY)
ALT: 13 U/L (ref 0–44)
AST: 14 U/L — ABNORMAL LOW (ref 15–41)
Albumin: 4.2 g/dL (ref 3.5–5.0)
Alkaline Phosphatase: 52 U/L (ref 38–126)
Anion gap: 9 (ref 5–15)
BUN: 16 mg/dL (ref 8–23)
CO2: 30 mmol/L (ref 22–32)
Calcium: 9.5 mg/dL (ref 8.9–10.3)
Chloride: 104 mmol/L (ref 98–111)
Creatinine: 0.81 mg/dL (ref 0.44–1.00)
GFR, Estimated: >60 mL/min (ref >60)
Glucose, Bld: 98 mg/dL (ref 70–99)
Potassium: 4.5 mmol/L (ref 3.5–5.1)
Sodium: 143 mmol/L (ref 135–145)
Total Bilirubin: 0.4 mg/dL (ref <1.2)
Total Protein: 7.7 g/dL (ref 6.5–8.1)

## 2023-07-04 LAB — RETICULOCYTES
Immature Retic Fract: 8.8 % (ref 2.3–15.9)
RBC.: 5.49 MIL/uL — ABNORMAL HIGH (ref 3.87–5.11)
Retic Count, Absolute: 65.3 10*3/uL (ref 19.0–186.0)
Retic Ct Pct: 1.2 % (ref 0.4–3.1)

## 2023-07-04 LAB — IRON AND IRON BINDING CAPACITY (CC-WL,HP ONLY)
Iron: 57 ug/dL (ref 28–170)
Saturation Ratios: 14 % (ref 10.4–31.8)
TIBC: 403 ug/dL (ref 250–450)
UIBC: 346 ug/dL (ref 148–442)

## 2023-07-04 NOTE — Progress Notes (Signed)
Hematology and Oncology Follow Up Visit   Karen Wall [id number] 1955-09-29 67 y.o. 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   Today she reports that she has been doing ok. No changes to her health or concerns today.   There has been no bleeding to her knowledge: denies epistaxis, gingivitis, hemoptysis, hematemesis, hematuria, melena, excessive bruising, blood donation.   She has had follow up with Karen Wall who managed her kidney cancer history. Last CT scan of chest was on 09/15/2022 which showed some cardiac plaque. She has had further evaluation by cardiology for this.  Her next imaging is scheduled for next year.   She has had no change in bowel or bladder habits.  She has had no cough or shortness of breath.  There has been no nausea or vomiting. No unintentional weight loss, night sweats, hematuria or flank pain.   Overall, her performance status right now is ECOG 1.    Wt Readings from Last 3 Encounters:  07/04/23 208 lb 12.8 oz (94.7 kg)  06/25/23 209 lb (94.8 kg)  06/20/23 209 lb (94.8 kg)     Medications:  Current Outpatient Medications:    acetaminophen (TYLENOL) 500 MG tablet, Take 500 mg by mouth every 6 (six) hours as needed for pain., Disp: , Rfl:    amLODipine-benazepril (LOTREL) 5-20 MG capsule, Take 1 capsule by mouth at bedtime., Disp: 90 capsule, Rfl: 1   LORazepam (ATIVAN) 1 MG tablet, Take 1 tablet (1 mg total) by mouth at bedtime as needed for anxiety or sleep., Disp: 30 tablet, Rfl: 0   lovastatin (MEVACOR) 20 MG tablet, Take 1 tablet (20 mg total)  by mouth at bedtime., Disp: 90 tablet, Rfl: 3   thyroid (ARMOUR THYROID) 15 MG tablet, Take 1 tablet (15 mg total) by mouth daily. Along with the 60 mg tablet. (Patient taking differently: Take 15 mg by mouth daily before breakfast. Along with the 60 mg tablet.), Disp: 90 tablet, Rfl: 3   thyroid (ARMOUR THYROID) 60 MG tablet, Take every day along with the 15 mg tablet. (Patient taking differently: Take 60 mg by mouth daily before breakfast. Take every day along with the 15 mg tablet.), Disp: 90 tablet, Rfl: 3   traMADol (ULTRAM) 50 MG tablet, Take 1-2 tablets (50-100 mg total) by mouth every 6 (six) hours as needed for moderate pain or severe pain., Disp: 20 tablet, Rfl: 0   Allergies:       Allergies  Allergen Reactions   Penicillins Shortness Of Breath      Did it involve swelling of the face/tongue/throat, SOB, or low BP? Yes Did it involve sudden or severe rash/hives, skin peeling, or any reaction on the inside of your mouth or nose? No Did you need to seek medical attention at a hospital or doctor's office? Unknown When did it last happen?  3-4 years  old     If all above answers are "NO", may proceed with cephalosporin use.        Past Medical History, Surgical history, Social history, and Family History were reviewed and updated.   Review of Systems: Review of Systems  Constitutional: Negative.   HENT:  Negative.   Eyes: Negative.   Respiratory: Negative.   Cardiovascular: Negative.   Gastrointestinal: Negative.   Endocrine: Negative.   Genitourinary: Negative.    Musculoskeletal: Negative.   Skin: Negative.   Neurological: Negative.   Hematological: Negative.   Psychiatric/Behavioral: Negative.       Physical Exam: Vitals:   07/04/23 1123  BP: 110/69  Pulse: 67  Resp: 18  Temp: 99.1 F (37.3 C)  SpO2: 96%       Wt Readings from Last 3 Encounters:  09/25/18 213 lb (96.6 kg)  09/23/18 213 lb (96.6 kg)  09/08/18 198 lb (89.8 kg)      Physical Exam Vitals  signs reviewed.  HENT:     Head: Normocephalic and atraumatic.  Eyes:     Pupils: Pupils are equal, round, and reactive to light.  Neck:     Musculoskeletal: Normal range of motion.  Cardiovascular:     Rate and Rhythm: Normal rate and regular rhythm.     Heart sounds: Normal heart sounds.  Pulmonary:     Effort: Pulmonary effort is normal.     Breath sounds: Normal breath sounds.  Abdominal:     General: Bowel sounds are normal.     Palpations: Abdomen is soft.  Musculoskeletal: Normal range of motion.        General: No tenderness or deformity.  Lymphadenopathy:     Cervical: No cervical adenopathy.  Skin:    General: Skin is warm and dry.     Findings: No erythema or rash.  Neurological:     Mental Status: She is alert and oriented to person, place, and time.  Psychiatric:        Behavior: Behavior normal.        Thought Content: Thought content normal.        Judgment: Judgment normal.          Recent Labs       Lab Results  Component Value Date    WBC 7.3 09/23/2018    HGB 12.0 09/26/2018    HCT 39.4 09/26/2018    MCV 87.6 09/23/2018    PLT 479 (H) 09/23/2018        Chemistry    Labs (Brief)          Component Value Date/Time    NA 137 09/26/2018 0339    K 4.0 09/26/2018 0339    CL 105 09/26/2018 0339    CO2 24 09/26/2018 0339    BUN 9 09/26/2018 0339    CREATININE 0.70 09/26/2018 0339    CREATININE 0.81 09/08/2018 1148    CREATININE 0.74 05/18/2015 1356      Labs (Brief)          Component Value Date/Time    CALCIUM 8.6 (L) 09/26/2018 0339    ALKPHOS 52 09/08/2018 1148    AST 13 (L) 09/08/2018 1148    ALT 13 09/08/2018 1148    BILITOT 0.2 (L) 09/08/2018 1148         Encounter Diagnoses  Name Primary?   Iron deficiency anemia secondary to inadequate dietary iron intake Yes   Cancer of kidney parenchyma, right (HCC)    Thrombocytosis  Iron deficiency anemia due to chronic blood loss    Impression and Plan: Karen Wall is a  67 year old white female.    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 She is s/p right nephrectomy- 09/2018. She is followed by Karen Wall for this. Final surveillance imaging is scheduled for next year per patient.   Thrombocythemia: Essential Thrombocythemia, Has IDA component as well. Has Hydrea but has not started this as iron helped to lower her counts. Platelets are 461 today which is better. Hgb and MCV significantly improved.   IDA: S/p recent Cologuard which was negative. She has elected to forgo endoscopy or capsule studies at this time after discussion with PCP- given current yearly CT/MR abdominal/chest imaging. No other blood loss noted. Has done well with iron infusion.   Disposition: Iron studies pending. Will replace if needed  RTC 4 months APP, labs(CBC w/, CMP, iron, ferritin, retic)  Brand [facility] PA-C  6/11/202012:28 PM

## 2023-07-08 ENCOUNTER — Ambulatory Visit (HOSPITAL_BASED_OUTPATIENT_CLINIC_OR_DEPARTMENT_OTHER)
Admission: RE | Admit: 2023-07-08 | Discharge: 2023-07-08 | Disposition: A | Discharge status: Home / Self Care | Payer: Medicare HMO | Source: Ambulatory Visit | Attending: Cardiology | Admitting: Cardiology

## 2023-07-08 DIAGNOSIS — R0609 Other forms of dyspnea: Secondary | ICD-10-CM | POA: Diagnosis not present

## 2023-07-08 LAB — ECHOCARDIOGRAM COMPLETE
AR max vel: 2.26 cm2
AV Area VTI: 2.26 cm2
AV Area mean vel: 2.14 cm2
AV Mean grad: 5 mm[Hg]
AV Peak grad: 8.2 mm[Hg]
Ao pk vel: 1.43 m/s
Area-P 1/2: 4.21 cm2
Calc EF: 62.9 %
MV M vel: 4.66 m/s
MV Peak grad: 86.9 mm[Hg]
S' Lateral: 3.4 cm
Single Plane A2C EF: 62.6 %
Single Plane A4C EF: 60.4 %

## 2023-07-11 ENCOUNTER — Telehealth: Payer: Self-pay

## 2023-07-11 NOTE — Telephone Encounter (Signed)
Left message on My Chart with Echo results per Dr. Vanetta Shawl note. Routed to PCP.

## 2023-07-12 ENCOUNTER — Telehealth: Payer: Self-pay

## 2023-07-12 NOTE — Telephone Encounter (Signed)
Pt viewed results on My Chart per Dr. Krasowski's note. Routed to PCP.  

## 2023-07-22 ENCOUNTER — Ambulatory Visit: Payer: Medicare HMO | Admitting: Family Medicine

## 2023-07-22 ENCOUNTER — Other Ambulatory Visit (HOSPITAL_BASED_OUTPATIENT_CLINIC_OR_DEPARTMENT_OTHER): Payer: Self-pay

## 2023-07-22 NOTE — Patient Instructions (Addendum)
 It was great to see you again today, I will be in touch with your lab work If not done already recommend seasonal flu shot, COVID booster, shingles vaccine series once you are well For cough and wheezing- albuterol  as needed Prednisone  40 mg daily for 3 days, then 20 mg daily for 3 days more as desired

## 2023-07-22 NOTE — Progress Notes (Signed)
 [facility] [address], [address] [place], [place] [address] [phone]  Date:  07/25/2023   Name:  Karen Wall   DOB:  1956/01/11   MRN:  [id number]  PCP:  [name], MD    Chief Complaint: Follow-up (Concerns/ questions: 1. Productive cough x 10 days. 2. Review Echo/Flu shot today: has not received/Shingrix: Medicare pt)   History of Present Illness:  Karen Wall is a 68 y.o. very pleasant female patient who presents with the following:  Patient seen today for follow-up of thyroid  disease Most recent visit with myself was in April 2024  History of thyroid  cancer s/p thyroidectomy 2014, HTN, renal cancer s/p partial nephrectomy 09/2018, iron  def, essential thrombocytosis.  She also has chronic foot problems which limit her mobility  As it turns out she has been sick for about 10 days- started with ST, cough, nasal congestion- all but the cough resolved after about 4 days The cough is sometimes productive Occasional wheezing Her son was sick first- he tested negative for covid and flu   Her urologist for history of renal cancer is Dr. 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 Bernie on December 5-they note a coronary CT angiogram in April 2024 showing minimal disease: Ventricular ectopy.  Will ask her to have echocardiogram to see ejection fraction if ejection fraction is diminished then we will reinitiate beta-blockers.  Previously we try metoprolol   she felt lousy on it so in the future we may try carvedilol.  If her ejection fraction is preserved then probably easiest maneuver will be to increase dose of Cardizem . Essential hypertension: Blood pressure well-controlled. Dyslipidemia knowing her calcium  score I did a calculation of her 10 years risk which is 9.3%.  She is taking Mevacor  I will switch her to Crestor  8 will be Crestor  20 that should get is what we want to be.  Fasting lipid profile 6 weeks   Flu vaccine- - recommended once well  COVID booster- recommended  Shingrix- recommended  Mammogram up-to-date Negative Cologuard 2023 DEXA scan 10/23 Recent blood work on chart-CMP, iron , CBC Patient Active Problem List   Diagnosis Date Noted   Dyslipidemia 06/20/2023   Coronary artery disease minimal disease based on coronary CT angio from spring 2024, calcium  score 47.2 09/25/2022   Ventricular ectopy 10/03/2021   Supraventricular tachycardia (HCC) 10/03/2021   Seizure disorder (HCC) 05/25/2019   Cancer of kidney parenchyma, right (HCC) 09/08/2018   Essential hypertension 05/12/2018   Numbness and tingling of left side of face 05/06/2018   Iron  malabsorption 11/22/2017   Thrombocytosis 11/22/2017   Bilateral ankle pain 10/15/2017   IDA (iron  deficiency anemia) 07/23/2016   Vitamin D  deficiency 03/15/2016   Low vitamin B12 level 03/15/2016   Dyspnea 05/28/2014   Chest tightness 05/28/2014   Thyroid  cancer, 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   Postsurgical hypothyroidism 07/24/2013    Past Medical History:  Diagnosis Date   A-fib [facility])    history of  Acquired flat foot    Ankle pain    bilateral   Arthritis    feet ankles, and knees    Cancer of kidney parenchyma, right (HCC) 09/08/2018   Depression    Diverticulosis    Dyspnea    with exertion    Dysrhythmia    hx of afib with hyperthroid no issues currently    GERD (gastroesophageal reflux disease)    H/O hiatal hernia 08/09/2018    Moderate to Large   Hyperlipidemia    Hypertension    Hypothyroidism    Iron  deficiency anemia    iron  deficiency last iron  infusion 08/2018   Iron  malabsorption 11/22/2017   Numbness    Left side of face around mouth - unknown etiology   Numbness and tingling    left side - facial   Palpitations    Occurred during hyperthyroid   PONV (postoperative nausea and vomiting)    Right renal mass 07/2018   2.1 cm solid heterogeneously enhancing mass in posterior midpole    Sinus tachycardia by electrocardiogram 05/12/2018   Thrombocythemia    Thrombocytosis 11/22/2017   Thyroid  cancer (HCC)    Thyroid  disease    hyperthyroidism   Vitamin B 12 deficiency    Vitamin D  deficiency     Past Surgical History:  Procedure Laterality Date   BREAST BIOPSY     age 68 - negative   CHOLECYSTECTOMY  1992   OPERATIVE ULTRASOUND Right 09/25/2018   Procedure: OPERATIVE ULTRASOUND;  Surgeon: Renda Glance, MD;  Location: [facility];  Service: Urology;  Laterality: Right;   ROBOTIC ASSITED PARTIAL NEPHRECTOMY Right 09/25/2018   Procedure: XI ROBOTIC RIGHT  ASSITED PARTIAL NEPHRECTOMY;  Surgeon: Renda Glance, MD;  Location: [facility];  Service: Urology;  Laterality: Right;   THYROIDECTOMY N/A 07/03/2013   Procedure: TOTAL THYROIDECTOMY;  Surgeon: Krystal CHRISTELLA Spinner, MD;  Location: [facility];  Service: General;  Laterality: N/A;    Social History   Tobacco Use   Smoking status: Never   Smokeless tobacco: Never  Vaping Use   Vaping status: Never Used  Substance Use Topics   Alcohol use: No    Alcohol/week: 0.0 standard drinks of alcohol   Drug use: No    Family History  Problem Relation Age of Onset   Hypertension Mother    Diabetes Mother    Hyperlipidemia Mother    COPD Mother    CAD Father        MI in his 39s   Diabetes Father    Hyperlipidemia Father    Hypertension Father    Mental illness Father    Mental illness Maternal Grandmother    Stroke Maternal Grandmother    Diabetes Maternal  Grandmother    Stroke Maternal Grandfather    Heart attack Maternal Grandfather    Congestive Heart Failure Paternal Grandmother    Heart attack Paternal Grandfather     Allergies  Allergen Reactions   Penicillins Shortness Of Breath    Did it involve swelling of the face/tongue/throat, SOB, or low BP? Yes Did it involve sudden or severe rash/hives, skin peeling, or any reaction on the inside of your mouth or nose? No Did you need to seek medical attention at a hospital or doctor's office? Unknown When did it last happen?  31-34 years old     If all above answers are NO, may proceed with cephalosporin use.     Medication list has been reviewed and updated.  Current Outpatient Medications on File  Prior to Visit  Medication Sig Dispense Refill   acetaminophen  (TYLENOL ) 500 MG tablet Take 500 mg by mouth every 6 (six) hours as needed for pain.     aspirin 81 MG EC tablet Take 81 mg by mouth daily. Swallow whole.     Cholecalciferol 125 MCG (5000 UT) capsule Take 5,000 Units by mouth once a week.     Cyanocobalamin  2500 MCG SUBL Place 1 tablet under the tongue every other day.     diltiazem  (CARDIZEM  CD) 180 MG 24 hr capsule Take 1 capsule (180 mg total) by mouth daily. 90 capsule 3   levothyroxine  (SYNTHROID ) 100 MCG tablet Take 1 tablet (100 mcg total) by mouth daily. 30 tablet 6   levothyroxine  (SYNTHROID ) 88 MCG tablet Take 1 tablet (88 mcg total) by mouth every other day. Alternate with the 100mcg dose. 45 tablet 3   LORazepam  (ATIVAN ) 1 MG tablet Take 1 tablet (1 mg total) by mouth as needed for anxiety or sleep. 30 tablet 1   metroNIDAZOLE  (METROGEL ) 1 % gel Apply topically daily as needed for facial rash. 60 g 0   rosuvastatin  (CRESTOR ) 20 MG tablet Take 1 tablet (20 mg total) by mouth daily. 90 tablet 3   FLUoxetine  (PROZAC ) 20 MG capsule Alternate between taking 1 capsule (20 mg total) by mouth daily and 2 capsules (40 mg total) by mouth daily. (Patient not taking: Reported on  07/25/2023) 180 capsule 3   hydroxyurea  (HYDREA ) 500 MG capsule Take 1 capsule (500 mg total) by mouth daily. May take with food to minimize GI side effects. (Patient not taking: Reported on 10/25/2022) 30 capsule 6   No current facility-administered medications on file prior to visit.    Review of Systems:  As per HPI- otherwise negative.   Physical Examination: Vitals:   07/25/23 1115  BP: 122/80  Pulse: 71  Resp: 18  Temp: 98.2 F (36.8 C)  SpO2: 98%   Vitals:   07/25/23 1115  Weight: 206 lb 9.6 oz (93.7 kg)  Height: 5' 3 (1.6 m)   Body mass index is 36.6 kg/m. Ideal Body Weight: Weight in (lb) to have BMI = 25: 140.8  GEN: no acute distress.  Obese, looks well HEENT: Atraumatic, Normocephalic.  Bilateral TM wnl, oropharynx normal.  PEERL,EOMI.   Ears and Nose: No external deformity. CV: RRR, No M/G/R. No JVD. No thrill. No extra heart sounds. PULM: Mild bilateral expiratory wheezes are present, no crackles, rhonchi. No retractions. No resp. distress. No accessory muscle use. ABD: S, NT, ND, +BS. No rebound. No HSM. EXTR: No c/c/e PSYCH: Normally interactive. Conversant.   Lab Results  Component Value Date   HGBA1C 5.7 03/07/2022    Assessment and Plan: Essential hypertension  Hyperlipidemia, unspecified hyperlipidemia type - Plan: Lipid panel  Postsurgical hypothyroidism - Plan: TSH  Screening for diabetes mellitus - Plan: Hemoglobin A1c  Wheezing - Plan: predniSONE  (DELTASONE ) 20 MG tablet, albuterol  (VENTOLIN  HFA) 108 (90 Base) MCG/ACT inhaler  Leg cramps - Plan: Magnesium  Patient seen today with concern of illness.  Her main concern now is cough and wheezing-she otherwise feels okay, no fever or bodyaches We will treat her with prednisone  for 6 days, albuterol  inhaler I encouraged her to get a flu shot when she is well Routine lab work is pending as above-she does request a magnesium level due to occasional leg cramps Signed Harlene Schroeder,  MD  Received labs as below, message to patient Results for orders placed or performed in visit  on 07/25/23  Hemoglobin A1c   Collection Time: 07/25/23 12:01 PM  Result Value Ref Range   Hgb A1c MFr Bld 5.9 4.6 - 6.5 %  Lipid panel   Collection Time: 07/25/23 12:01 PM  Result Value Ref Range   Cholesterol 145 0 - 200 mg/dL   Triglycerides 893.9 0.0 - 149.0 mg/dL   HDL 53.29 >60.99 mg/dL   VLDL 78.7 0.0 - 59.9 mg/dL   LDL Cholesterol 77 0 - 99 mg/dL   Total CHOL/HDL Ratio 3    NonHDL 98.08   TSH   Collection Time: 07/25/23 12:01 PM  Result Value Ref Range   TSH 1.07 0.35 - 5.50 uIU/mL  Magnesium   Collection Time: 07/25/23 12:01 PM  Result Value Ref Range   Magnesium 2.3 1.5 - 2.5 mg/dL

## 2023-07-25 ENCOUNTER — Other Ambulatory Visit (HOSPITAL_BASED_OUTPATIENT_CLINIC_OR_DEPARTMENT_OTHER): Payer: Self-pay

## 2023-07-25 ENCOUNTER — Ambulatory Visit (INDEPENDENT_AMBULATORY_CARE_PROVIDER_SITE_OTHER): Payer: Medicare HMO | Admitting: Family Medicine

## 2023-07-25 ENCOUNTER — Encounter: Payer: Self-pay | Admitting: Family Medicine

## 2023-07-25 VITALS — BP 122/80 | HR 71 | Temp 98.2°F | Resp 18 | Ht 63.0 in | Wt 206.6 lb

## 2023-07-25 DIAGNOSIS — I10 Essential (primary) hypertension: Secondary | ICD-10-CM | POA: Diagnosis not present

## 2023-07-25 DIAGNOSIS — Z131 Encounter for screening for diabetes mellitus: Secondary | ICD-10-CM | POA: Diagnosis not present

## 2023-07-25 DIAGNOSIS — R252 Cramp and spasm: Secondary | ICD-10-CM

## 2023-07-25 DIAGNOSIS — R062 Wheezing: Secondary | ICD-10-CM | POA: Diagnosis not present

## 2023-07-25 DIAGNOSIS — E785 Hyperlipidemia, unspecified: Secondary | ICD-10-CM

## 2023-07-25 DIAGNOSIS — E890 Postprocedural hypothyroidism: Secondary | ICD-10-CM | POA: Diagnosis not present

## 2023-07-25 LAB — LIPID PANEL
Cholesterol: 145 mg/dL (ref 0–200)
HDL: 46.7 mg/dL (ref >39.00)
LDL Cholesterol: 77 mg/dL (ref 0–99)
NonHDL: 98.08
Total CHOL/HDL Ratio: 3
Triglycerides: 106 mg/dL (ref 0.0–149.0)
VLDL: 21.2 mg/dL (ref 0.0–40.0)

## 2023-07-25 LAB — MAGNESIUM: Magnesium: 2.3 mg/dL (ref 1.5–2.5)

## 2023-07-25 LAB — TSH: TSH: 1.07 u[IU]/mL (ref 0.35–5.50)

## 2023-07-25 LAB — HEMOGLOBIN A1C: Hgb A1c MFr Bld: 5.9 % (ref 4.6–6.5)

## 2023-07-25 MED ORDER — ALBUTEROL SULFATE HFA 108 (90 BASE) MCG/ACT IN AERS
2.0000 | INHALATION_SPRAY | Freq: Four times a day (QID) | RESPIRATORY_TRACT | 0 refills | Status: DC | PRN
  Filled 2023-07-25: qty 6.7, 20d supply, fill #0

## 2023-07-25 MED ORDER — PREDNISONE 20 MG PO TABS
ORAL_TABLET | ORAL | 0 refills | Status: AC
  Filled 2023-07-25: qty 9, 6d supply, fill #0

## 2023-08-07 ENCOUNTER — Other Ambulatory Visit: Payer: Self-pay | Admitting: Family Medicine

## 2023-08-07 ENCOUNTER — Other Ambulatory Visit (HOSPITAL_BASED_OUTPATIENT_CLINIC_OR_DEPARTMENT_OTHER): Payer: Self-pay

## 2023-08-07 DIAGNOSIS — R062 Wheezing: Secondary | ICD-10-CM

## 2023-08-30 ENCOUNTER — Other Ambulatory Visit (HOSPITAL_BASED_OUTPATIENT_CLINIC_OR_DEPARTMENT_OTHER): Payer: Self-pay | Admitting: Urology

## 2023-08-30 ENCOUNTER — Telehealth (HOSPITAL_BASED_OUTPATIENT_CLINIC_OR_DEPARTMENT_OTHER): Payer: Self-pay

## 2023-08-30 DIAGNOSIS — Z85528 Personal history of other malignant neoplasm of kidney: Secondary | ICD-10-CM

## 2023-09-02 ENCOUNTER — Telehealth (HOSPITAL_BASED_OUTPATIENT_CLINIC_OR_DEPARTMENT_OTHER): Payer: Self-pay

## 2023-09-06 DIAGNOSIS — Z85528 Personal history of other malignant neoplasm of kidney: Secondary | ICD-10-CM | POA: Diagnosis not present

## 2023-09-11 ENCOUNTER — Ambulatory Visit (HOSPITAL_BASED_OUTPATIENT_CLINIC_OR_DEPARTMENT_OTHER)
Admission: RE | Admit: 2023-09-11 | Discharge: 2023-09-11 | Disposition: A | Discharge status: Home / Self Care | Payer: Medicare HMO | Source: Ambulatory Visit | Attending: Urology | Admitting: Urology

## 2023-09-11 ENCOUNTER — Other Ambulatory Visit: Payer: Medicare HMO

## 2023-09-11 DIAGNOSIS — Z85528 Personal history of other malignant neoplasm of kidney: Secondary | ICD-10-CM | POA: Diagnosis not present

## 2023-09-11 DIAGNOSIS — K5730 Diverticulosis of large intestine without perforation or abscess without bleeding: Secondary | ICD-10-CM | POA: Diagnosis not present

## 2023-09-11 DIAGNOSIS — K429 Umbilical hernia without obstruction or gangrene: Secondary | ICD-10-CM | POA: Diagnosis not present

## 2023-09-11 DIAGNOSIS — Z905 Acquired absence of kidney: Secondary | ICD-10-CM | POA: Diagnosis not present

## 2023-09-11 DIAGNOSIS — D3502 Benign neoplasm of left adrenal gland: Secondary | ICD-10-CM | POA: Diagnosis not present

## 2023-09-11 MED ORDER — IOHEXOL 300 MG/ML  SOLN
100.0000 mL | Freq: Once | INTRAMUSCULAR | Status: AC | PRN
  Administered 2023-09-11: 100 mL via INTRAVENOUS

## 2023-09-20 DIAGNOSIS — C641 Malignant neoplasm of right kidney, except renal pelvis: Secondary | ICD-10-CM | POA: Diagnosis not present

## 2023-10-10 ENCOUNTER — Other Ambulatory Visit: Payer: Self-pay

## 2023-10-10 ENCOUNTER — Other Ambulatory Visit (HOSPITAL_BASED_OUTPATIENT_CLINIC_OR_DEPARTMENT_OTHER): Payer: Self-pay

## 2023-10-31 ENCOUNTER — Inpatient Hospital Stay: Payer: Medicare HMO | Admitting: Medical Oncology

## 2023-10-31 ENCOUNTER — Inpatient Hospital Stay: Payer: Medicare HMO | Attending: Medical Oncology

## 2023-10-31 ENCOUNTER — Encounter: Payer: Self-pay | Admitting: Medical Oncology

## 2023-10-31 VITALS — BP 112/75 | HR 85 | Temp 98.6°F | Resp 18 | Ht 63.0 in | Wt 210.0 lb

## 2023-10-31 DIAGNOSIS — D75839 Thrombocytosis, unspecified: Secondary | ICD-10-CM

## 2023-10-31 DIAGNOSIS — Z79899 Other long term (current) drug therapy: Secondary | ICD-10-CM | POA: Insufficient documentation

## 2023-10-31 DIAGNOSIS — D508 Other iron deficiency anemias: Secondary | ICD-10-CM

## 2023-10-31 DIAGNOSIS — D509 Iron deficiency anemia, unspecified: Secondary | ICD-10-CM | POA: Insufficient documentation

## 2023-10-31 DIAGNOSIS — D500 Iron deficiency anemia secondary to blood loss (chronic): Secondary | ICD-10-CM | POA: Diagnosis not present

## 2023-10-31 DIAGNOSIS — Z905 Acquired absence of kidney: Secondary | ICD-10-CM | POA: Insufficient documentation

## 2023-10-31 DIAGNOSIS — C641 Malignant neoplasm of right kidney, except renal pelvis: Secondary | ICD-10-CM

## 2023-10-31 DIAGNOSIS — R21 Rash and other nonspecific skin eruption: Secondary | ICD-10-CM | POA: Insufficient documentation

## 2023-10-31 DIAGNOSIS — K909 Intestinal malabsorption, unspecified: Secondary | ICD-10-CM

## 2023-10-31 DIAGNOSIS — D473 Essential (hemorrhagic) thrombocythemia: Secondary | ICD-10-CM | POA: Insufficient documentation

## 2023-10-31 DIAGNOSIS — Z85528 Personal history of other malignant neoplasm of kidney: Secondary | ICD-10-CM | POA: Insufficient documentation

## 2023-10-31 LAB — CMP (CANCER CENTER ONLY)
ALT: 12 U/L (ref 0–44)
AST: 16 U/L (ref 15–41)
Albumin: 4.1 g/dL (ref 3.5–5.0)
Alkaline Phosphatase: 57 U/L (ref 38–126)
Anion gap: 9 (ref 5–15)
BUN: 15 mg/dL (ref 8–23)
CO2: 30 mmol/L (ref 22–32)
Calcium: 9.4 mg/dL (ref 8.9–10.3)
Chloride: 102 mmol/L (ref 98–111)
Creatinine: 0.83 mg/dL (ref 0.44–1.00)
GFR, Estimated: >60 mL/min (ref >60)
Glucose, Bld: 112 mg/dL — ABNORMAL HIGH (ref 70–99)
Potassium: 4.3 mmol/L (ref 3.5–5.1)
Sodium: 141 mmol/L (ref 135–145)
Total Bilirubin: 0.4 mg/dL (ref 0.0–1.2)
Total Protein: 7.2 g/dL (ref 6.5–8.1)

## 2023-10-31 LAB — CBC
HCT: 45.2 % (ref 36.0–46.0)
Hemoglobin: 14.5 g/dL (ref 12.0–15.0)
MCH: 27.8 pg (ref 26.0–34.0)
MCHC: 32.1 g/dL (ref 30.0–36.0)
MCV: 86.6 fL (ref 80.0–100.0)
Platelets: 444 10*3/uL — ABNORMAL HIGH (ref 150–400)
RBC: 5.22 MIL/uL — ABNORMAL HIGH (ref 3.87–5.11)
RDW: 15.6 % — ABNORMAL HIGH (ref 11.5–15.5)
WBC: 7.9 10*3/uL (ref 4.0–10.5)
nRBC: 0 % (ref 0.0–0.2)

## 2023-10-31 LAB — FERRITIN: Ferritin: 66 ng/mL (ref 11–307)

## 2023-10-31 LAB — RETICULOCYTES
Immature Retic Fract: 8.2 % (ref 2.3–15.9)
RBC.: 5.21 MIL/uL — ABNORMAL HIGH (ref 3.87–5.11)
Retic Count, Absolute: 78.7 10*3/uL (ref 19.0–186.0)
Retic Ct Pct: 1.5 % (ref 0.4–3.1)

## 2023-10-31 LAB — IRON AND IRON BINDING CAPACITY (CC-WL,HP ONLY)
Iron: 97 ug/dL (ref 28–170)
Saturation Ratios: 26 % (ref 10.4–31.8)
TIBC: 372 ug/dL (ref 250–450)
UIBC: 275 ug/dL (ref 148–442)

## 2023-10-31 NOTE — Progress Notes (Signed)
 Hematology and Oncology Follow Up Visit   Karen Wall [id number] [birth year] 68 y.o. 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   Patients states that she has been doing well other than a rash of hands. This has been present for about 6 months. Itches. Involves her hands, face and lower legs. She has tried changing products, cleaning products, metorogel. When she was on prednisone for another issue it did improve.   There has been no bleeding to her knowledge: denies epistaxis, gingivitis, hemoptysis, hematemesis, hematuria, melena, excessive bruising, blood donation.   She continues to be followed by Dr. Laverle Patter who managed her kidney cancer history. Last CT scan of chest was on 09/11/2023 which did not show any concern for recurrent disease. This was her last yearly CT scan per patient.   She has had no change in bowel or bladder habits.  She has had no cough or shortness of breath.  There has been no nausea or vomiting. No unintentional weight loss, night sweats, hematuria or flank pain.   Overall, her performance status right now is ECOG 1.    Wt Readings from Last 3 Encounters:  10/31/23 210 lb (95.3 kg)  07/25/23 206 lb 9.6 oz (93.7 kg)  07/04/23 208 lb 12.8 oz (94.7 kg)     Medications:  Current Outpatient Medications:    acetaminophen (TYLENOL) 500 MG tablet, Take 500 mg by mouth every 6 (six) hours as needed for pain., Disp: , Rfl:    amLODipine-benazepril (LOTREL) 5-20 MG capsule, Take 1 capsule by mouth at bedtime., Disp: 90 capsule, Rfl: 1   LORazepam (ATIVAN) 1 MG  tablet, Take 1 tablet (1 mg total) by mouth at bedtime as needed for anxiety or sleep., Disp: 30 tablet, Rfl: 0   lovastatin (MEVACOR) 20 MG tablet, Take 1 tablet (20 mg total) by mouth at bedtime., Disp: 90 tablet, Rfl: 3   thyroid (ARMOUR THYROID) 15 MG tablet, Take 1 tablet (15 mg total) by mouth daily. Along with the 60 mg tablet. (Patient taking differently: Take 15 mg by mouth daily before breakfast. Along with the 60 mg tablet.), Disp: 90 tablet, Rfl: 3   thyroid (ARMOUR THYROID) 60 MG tablet, Take every day along with the 15 mg tablet. (Patient taking differently: Take 60 mg by mouth daily before breakfast. Take every day along with the 15 mg tablet.), Disp: 90 tablet, Rfl: 3   traMADol (ULTRAM) 50 MG tablet, Take 1-2 tablets (50-100 mg total) by mouth every 6 (six) hours as needed for moderate pain or severe pain., Disp: 20 tablet, Rfl: 0   Allergies:       Allergies  Allergen Reactions   Penicillins Shortness Of Breath      Did it involve swelling of the face/tongue/throat, SOB, or low BP? Yes Did it involve sudden or severe rash/hives, skin peeling, or any reaction on the  inside of your mouth or nose? No Did you need to seek medical attention at a hospital or doctor's office? Unknown When did it last happen?  75-54 years old     If all above answers are "NO", may proceed with cephalosporin use.        Past Medical History, Surgical history, Social history, and Family History were reviewed and updated.   Review of Systems: Review of Systems  Constitutional: Negative.   HENT:  Negative.   Eyes: Negative.   Respiratory: Negative.   Cardiovascular: Negative.   Gastrointestinal: Negative.   Endocrine: Negative.   Genitourinary: Negative.    Musculoskeletal: Negative.   Skin: Negative.   Neurological: Negative.   Hematological: Negative.   Psychiatric/Behavioral: Negative.       Physical Exam: Vitals:   10/31/23 1023  BP: 112/75  Pulse: 85  Resp: 18  Temp: 98.6 F (37  C)  SpO2: 99%   Wt Readings from Last 3 Encounters:  10/31/23 210 lb (95.3 kg)  07/25/23 206 lb 9.6 oz (93.7 kg)  07/04/23 208 lb 12.8 oz (94.7 kg)    Physical Exam Vitals signs reviewed.  HENT:     Head: Normocephalic and atraumatic.  Eyes:     Pupils: Pupils are equal, round, and reactive to light.  Neck:     Musculoskeletal: Normal range of motion.  Cardiovascular:     Rate and Rhythm: Normal rate and regular rhythm.     Heart sounds: Normal heart sounds.  Pulmonary:     Effort: Pulmonary effort is normal.     Breath sounds: Normal breath sounds.  Abdominal:     General: Bowel sounds are normal.     Palpations: Abdomen is soft.  Musculoskeletal: Normal range of motion.        General: No tenderness or deformity.  Lymphadenopathy:     Cervical: No cervical adenopathy.  Skin:    General: Skin is warm and dry.     Findings: No erythema or rash.  Neurological:     Mental Status: She is alert and oriented to person, place, and time.  Psychiatric:        Behavior: Behavior normal.        Thought Content: Thought content normal.        Judgment: Judgment normal.             Recent Labs       Lab Results  Component Value Date    WBC 7.3 09/23/2018    HGB 12.0 09/26/2018    HCT 39.4 09/26/2018    MCV 87.6 09/23/2018    PLT 479 (H) 09/23/2018        Chemistry    Labs (Brief)          Component Value Date/Time    NA 137 09/26/2018 0339    K 4.0 09/26/2018 0339    CL 105 09/26/2018 0339    CO2 24 09/26/2018 0339    BUN 9 09/26/2018 0339    CREATININE 0.70 09/26/2018 0339    CREATININE 0.81 09/08/2018 1148    CREATININE 0.74 05/18/2015 1356      Labs (Brief)          Component Value Date/Time    CALCIUM 8.6 (L) 09/26/2018 0339    ALKPHOS 52 09/08/2018 1148    AST 13 (L) 09/08/2018 1148    ALT 13 09/08/2018 1148    BILITOT 0.2 (L) 09/08/2018 1148         Encounter Diagnoses  Name Primary?   Iron deficiency anemia secondary to inadequate  dietary iron intake Yes   Thrombocytosis    Iron deficiency anemia due to chronic blood loss    Iron malabsorption     Impression and Plan: Karen Wall is a 68 year old white female.    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 She is s/p right nephrectomy- 09/2018. She is followed by Dr. Rozanne Corners for this. Final surveillance imaging is scheduled for next year per patient.   Thrombocythemia: Essential Thrombocythemia, has IDA component as well. Has Hydrea but has not started this as iron has helped to lower her counts. Currently her Platelet level is 444 which continues to reduce.   IDA: Multifactorial. S/p recent Cologuard which was negative. She has elected to forgo endoscopy or capsule studies at this time after discussion with PCP- given current yearly CT/MR abdominal/chest imaging. No other blood loss noted. Has done well with iron infusion. Hgb stable today at 14.5. Normal MCV.   Rash: Question if this is related to her asa or CARDIZEM.Aaron Aas She will trial off of her asa first for 2-8 weeks to see if symptoms resolve. If not she will contact cardiology to see if adjustment in her Cardizem is recommended.   Disposition: Iron studies pending. Will replace if needed  RTC 4 months APP, labs(CBC w/, CMP, iron, ferritin, retic)  Alonza Arthurs [facility] PA-C  6/11/202012:28 PM

## 2024-01-19 NOTE — Progress Notes (Unsigned)
 [facility] [address] [place], [place] [address] [phone]  Date:  01/22/2024   Name:  Karen Wall   DOB:  [birth year]   MRN:  [id number]  PCP:  [name], MD    Chief Complaint: No chief complaint on file.   History of Present Illness:  Karen Wall is a 68 y.o. very pleasant female patient who presents with the following:  Patient seen today for follow-up of her blood sugar control.  Most recent visit with myself was in January of this year History of thyroid  cancer s/p thyroidectomy 2014, HTN, renal cancer s/p partial nephrectomy 09/2018, iron  def, essential thrombocytosis, prediabetes.  She also has chronic foot problems which limit her mobility  Lab Results  Component Value Date   HGBA1C 5.9 07/25/2023   She is followed by Dr. 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 Iron  deficiency anemia Current Therapy:        EC ASA 81 mg po q day IV Iron  as needed --last dose given on 08/27/2022 Hydrea  500 mg po q day -- not started  Recommend Shingrix Recommend RSV Mammogram can be updated Cologuard completed 2023 Patient Active Problem List   Diagnosis Date Noted   Dyslipidemia 06/20/2023   Coronary artery disease minimal disease based on coronary CT angio from spring 2024, calcium  score 47.2 09/25/2022   Ventricular ectopy 10/03/2021   Supraventricular tachycardia (HCC) 10/03/2021   Seizure disorder (HCC) 05/25/2019   Cancer of kidney parenchyma, right (HCC) 09/08/2018   Essential hypertension 05/12/2018   Numbness and tingling of left side of face 05/06/2018   Iron  malabsorption 11/22/2017   Thrombocytosis 11/22/2017   Bilateral ankle pain 10/15/2017    IDA (iron  deficiency anemia) 07/23/2016   Vitamin D  deficiency 03/15/2016   Low vitamin B12 level 03/15/2016   Dyspnea 05/28/2014   Chest tightness 05/28/2014   Thyroid  cancer, 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   Postsurgical hypothyroidism 07/24/2013    Past Medical History:  Diagnosis Date   A-fib [facility])    history of   Acquired flat foot    Ankle pain    bilateral   Arthritis    feet ankles, and knees    Cancer of kidney parenchyma, right (HCC) 09/08/2018   Depression    Diverticulosis    Dyspnea    with exertion    Dysrhythmia    hx of afib with hyperthroid no issues currently    GERD (gastroesophageal reflux disease)    H/O hiatal hernia 08/09/2018   Moderate to Large   Hyperlipidemia    Hypertension    Hypothyroidism    Iron  deficiency anemia    iron  deficiency last iron  infusion 08/2018   Iron  malabsorption 11/22/2017   Numbness    Left side of face around mouth - unknown etiology   Numbness and tingling    left side - facial   Palpitations    Occurred during hyperthyroid   PONV (postoperative nausea and vomiting)    Right renal mass 07/2018   2.1 cm solid heterogeneously enhancing mass in posterior midpole    Sinus tachycardia by electrocardiogram 05/12/2018   Thrombocythemia    Thrombocytosis 11/22/2017   Thyroid  cancer (HCC)    Thyroid  disease  hyperthyroidism   Vitamin B 12 deficiency    Vitamin D  deficiency     Past Surgical History:  Procedure Laterality Date   BREAST BIOPSY     age 31 - negative   CHOLECYSTECTOMY  1992   OPERATIVE ULTRASOUND Right 09/25/2018   Procedure: OPERATIVE ULTRASOUND;  Surgeon: Renda Glance, MD;  Location: [facility];  Service: Urology;  Laterality: Right;   ROBOTIC ASSITED PARTIAL NEPHRECTOMY Right 09/25/2018   Procedure: XI ROBOTIC RIGHT  ASSITED PARTIAL NEPHRECTOMY;  Surgeon: Renda Glance, MD;  Location: [facility];  Service: Urology;  Laterality: Right;   THYROIDECTOMY N/A 07/03/2013   Procedure: TOTAL  THYROIDECTOMY;  Surgeon: Krystal CHRISTELLA Spinner, MD;  Location: [facility];  Service: General;  Laterality: N/A;    Social History   Tobacco Use   Smoking status: Never   Smokeless tobacco: Never  Vaping Use   Vaping status: Never Used  Substance Use Topics   Alcohol use: No    Alcohol/week: 0.0 standard drinks of alcohol   Drug use: No    Family History  Problem Relation Age of Onset   Hypertension Mother    Diabetes Mother    Hyperlipidemia Mother    COPD Mother    CAD Father        MI in his 38s   Diabetes Father    Hyperlipidemia Father    Hypertension Father    Mental illness Father    Mental illness Maternal Grandmother    Stroke Maternal Grandmother    Diabetes Maternal Grandmother    Stroke Maternal Grandfather    Heart attack Maternal Grandfather    Congestive Heart Failure Paternal Grandmother    Heart attack Paternal Grandfather     Allergies  Allergen Reactions   Penicillins Shortness Of Breath    Did it involve swelling of the face/tongue/throat, SOB, or low BP? Yes Did it involve sudden or severe rash/hives, skin peeling, or any reaction on the inside of your mouth or nose? No Did you need to seek medical attention at a hospital or doctor's office? Unknown When did it last happen?  37-47 years old     If all above answers are NO, may proceed with cephalosporin use.     Medication list has been reviewed and updated.  Current Outpatient Medications on File Prior to Visit  Medication Sig Dispense Refill   acetaminophen  (TYLENOL ) 500 MG tablet Take 500 mg by mouth every 6 (six) hours as needed for pain.     albuterol  (VENTOLIN  HFA) 108 (90 Base) MCG/ACT inhaler Inhale 2 puffs into the lungs every 6 (six) hours as needed for wheezing or shortness of breath. (Patient not taking: Reported on 10/31/2023) 6.7 g 0   aspirin 81 MG EC tablet Take 81 mg by mouth daily. Swallow whole.     Cholecalciferol 125 MCG (5000 UT) capsule Take 5,000 Units by mouth once a week.      Cyanocobalamin  2500 MCG SUBL Place 1 tablet under the tongue every other day.     diltiazem  (CARDIZEM  CD) 180 MG 24 hr capsule Take 1 capsule (180 mg total) by mouth daily. 90 capsule 3   hydroxyurea  (HYDREA ) 500 MG capsule Take 1 capsule (500 mg total) by mouth daily. May take with food to minimize GI side effects. (Patient not taking: Reported on 10/31/2023) 30 capsule 6   levothyroxine  (SYNTHROID ) 100 MCG tablet Take 1 tablet (100 mcg total) by mouth daily. 30 tablet 6   levothyroxine  (SYNTHROID ) 88 MCG tablet Take 1  tablet (88 mcg total) by mouth every other day. Alternate with the 100mcg dose. 45 tablet 3   LORazepam  (ATIVAN ) 1 MG tablet Take 1 tablet (1 mg total) by mouth as needed for anxiety or sleep. 30 tablet 1   metroNIDAZOLE  (METROGEL ) 1 % gel Apply topically daily as needed for facial rash. 60 g 0   rosuvastatin  (CRESTOR ) 20 MG tablet Take 1 tablet (20 mg total) by mouth daily. 90 tablet 3   No current facility-administered medications on file prior to visit.    Review of Systems:  As per HPI- otherwise negative.   Physical Examination: There were no vitals filed for this visit. There were no vitals filed for this visit. There is no height or weight on file to calculate BMI. Ideal Body Weight:    GEN: no acute distress. HEENT: Atraumatic, Normocephalic.  Ears and Nose: No external deformity. CV: RRR, No M/G/R. No JVD. No thrill. No extra heart sounds. PULM: CTA B, no wheezes, crackles, rhonchi. No retractions. No resp. distress. No accessory muscle use. ABD: S, NT, ND, +BS. No rebound. No HSM. EXTR: No c/c/e PSYCH: Normally interactive. Conversant.    Assessment and Plan: ***  Signed Harlene Schroeder, MD

## 2024-01-21 ENCOUNTER — Other Ambulatory Visit (HOSPITAL_BASED_OUTPATIENT_CLINIC_OR_DEPARTMENT_OTHER): Payer: Self-pay

## 2024-01-22 ENCOUNTER — Ambulatory Visit (INDEPENDENT_AMBULATORY_CARE_PROVIDER_SITE_OTHER): Payer: Medicare HMO | Admitting: Family Medicine

## 2024-01-22 ENCOUNTER — Encounter: Payer: Self-pay | Admitting: Family Medicine

## 2024-01-22 ENCOUNTER — Other Ambulatory Visit (HOSPITAL_BASED_OUTPATIENT_CLINIC_OR_DEPARTMENT_OTHER): Payer: Self-pay

## 2024-01-22 ENCOUNTER — Other Ambulatory Visit: Payer: Self-pay

## 2024-01-22 VITALS — BP 116/68 | HR 72 | Temp 98.3°F | Ht 63.0 in | Wt 213.0 lb

## 2024-01-22 DIAGNOSIS — D509 Iron deficiency anemia, unspecified: Secondary | ICD-10-CM | POA: Diagnosis not present

## 2024-01-22 DIAGNOSIS — Z1231 Encounter for screening mammogram for malignant neoplasm of breast: Secondary | ICD-10-CM

## 2024-01-22 DIAGNOSIS — Z20828 Contact with and (suspected) exposure to other viral communicable diseases: Secondary | ICD-10-CM

## 2024-01-22 DIAGNOSIS — R7303 Prediabetes: Secondary | ICD-10-CM | POA: Diagnosis not present

## 2024-01-22 DIAGNOSIS — D75839 Thrombocytosis, unspecified: Secondary | ICD-10-CM | POA: Insufficient documentation

## 2024-01-22 DIAGNOSIS — C641 Malignant neoplasm of right kidney, except renal pelvis: Secondary | ICD-10-CM

## 2024-01-22 DIAGNOSIS — Z9889 Other specified postprocedural states: Secondary | ICD-10-CM | POA: Insufficient documentation

## 2024-01-22 DIAGNOSIS — E538 Deficiency of other specified B group vitamins: Secondary | ICD-10-CM | POA: Insufficient documentation

## 2024-01-22 DIAGNOSIS — E890 Postprocedural hypothyroidism: Secondary | ICD-10-CM | POA: Diagnosis not present

## 2024-01-22 DIAGNOSIS — R002 Palpitations: Secondary | ICD-10-CM | POA: Insufficient documentation

## 2024-01-22 DIAGNOSIS — C73 Malignant neoplasm of thyroid gland: Secondary | ICD-10-CM | POA: Diagnosis not present

## 2024-01-22 DIAGNOSIS — E785 Hyperlipidemia, unspecified: Secondary | ICD-10-CM

## 2024-01-22 DIAGNOSIS — I10 Essential (primary) hypertension: Secondary | ICD-10-CM

## 2024-01-22 DIAGNOSIS — K219 Gastro-esophageal reflux disease without esophagitis: Secondary | ICD-10-CM | POA: Insufficient documentation

## 2024-01-22 DIAGNOSIS — I499 Cardiac arrhythmia, unspecified: Secondary | ICD-10-CM | POA: Insufficient documentation

## 2024-01-22 DIAGNOSIS — K5790 Diverticulosis of intestine, part unspecified, without perforation or abscess without bleeding: Secondary | ICD-10-CM | POA: Insufficient documentation

## 2024-01-22 DIAGNOSIS — M2140 Flat foot [pes planus] (acquired), unspecified foot: Secondary | ICD-10-CM | POA: Insufficient documentation

## 2024-01-22 DIAGNOSIS — E039 Hypothyroidism, unspecified: Secondary | ICD-10-CM | POA: Insufficient documentation

## 2024-01-22 DIAGNOSIS — R200 Anesthesia of skin: Secondary | ICD-10-CM | POA: Insufficient documentation

## 2024-01-22 DIAGNOSIS — F32A Depression, unspecified: Secondary | ICD-10-CM | POA: Insufficient documentation

## 2024-01-22 DIAGNOSIS — E079 Disorder of thyroid, unspecified: Secondary | ICD-10-CM | POA: Insufficient documentation

## 2024-01-22 DIAGNOSIS — I4891 Unspecified atrial fibrillation: Secondary | ICD-10-CM | POA: Insufficient documentation

## 2024-01-22 DIAGNOSIS — M1990 Unspecified osteoarthritis, unspecified site: Secondary | ICD-10-CM | POA: Insufficient documentation

## 2024-01-22 LAB — TSH: TSH: 0.79 u[IU]/mL (ref 0.35–5.50)

## 2024-01-22 LAB — HEMOGLOBIN A1C: Hgb A1c MFr Bld: 5.9 % (ref 4.6–6.5)

## 2024-01-22 MED ORDER — LEVOTHYROXINE SODIUM 100 MCG PO TABS
100.0000 ug | ORAL_TABLET | Freq: Every day | ORAL | 3 refills | Status: AC
  Filled 2024-01-22: qty 45, 90d supply, fill #0
  Filled 2024-04-17: qty 45, 90d supply, fill #1
  Filled 2024-06-29 – 2024-07-20 (×3): qty 45, 90d supply, fill #2

## 2024-01-22 NOTE — Patient Instructions (Addendum)
 It was good to see you again today- I will be in touch with your labs asap   Recommend one dose of RSV at your convenience and the shingles vaccine series as well   You did have your tdap in 2020

## 2024-01-23 ENCOUNTER — Encounter: Payer: Self-pay | Admitting: Cardiology

## 2024-01-23 ENCOUNTER — Ambulatory Visit: Attending: Cardiology | Admitting: Cardiology

## 2024-01-23 ENCOUNTER — Other Ambulatory Visit (HOSPITAL_BASED_OUTPATIENT_CLINIC_OR_DEPARTMENT_OTHER): Payer: Self-pay

## 2024-01-23 VITALS — BP 122/82 | HR 83 | Ht 63.0 in | Wt 211.0 lb

## 2024-01-23 DIAGNOSIS — E785 Hyperlipidemia, unspecified: Secondary | ICD-10-CM | POA: Diagnosis not present

## 2024-01-23 DIAGNOSIS — I493 Ventricular premature depolarization: Secondary | ICD-10-CM

## 2024-01-23 DIAGNOSIS — I471 Supraventricular tachycardia, unspecified: Secondary | ICD-10-CM

## 2024-01-23 DIAGNOSIS — I2510 Atherosclerotic heart disease of native coronary artery without angina pectoris: Secondary | ICD-10-CM

## 2024-01-23 DIAGNOSIS — I10 Essential (primary) hypertension: Secondary | ICD-10-CM

## 2024-01-23 MED ORDER — DILTIAZEM HCL ER COATED BEADS 240 MG PO CP24
240.0000 mg | ORAL_CAPSULE | Freq: Every day | ORAL | 3 refills | Status: DC
  Filled 2024-01-23: qty 90, 90d supply, fill #0
  Filled 2024-04-15: qty 90, 90d supply, fill #1
  Filled 2024-06-29: qty 90, 90d supply, fill #2

## 2024-01-23 NOTE — Progress Notes (Signed)
 Cardiology Office Note:    Date:  01/23/2024   ID:  DIANI JILLSON, DOB 06/21/1956, MRN [id number]  PCP:  [name], MD  Cardiologist:  Lamar Fitch, MD    Referring MD: [name], MD   No chief complaint on file.   History of Present Illness:    Karen Wall is a 68 y.o. female past medical history significant for supraventricular tachycardia frequent ventricular ectopy with total burden 16.6% 2 years ago history of nephrectomy secondary to cancer, coronary CT angio done in April 2024 showed minimal disease with calcium  score 47.2.  Comes today to my office for follow-up overall she is doing fine she still gets some palpitations but she states she is fine with that.  She just worried that may create some problems.  I want her potential complication of this arrhythmia is a cardiomyopathy and I told her shortness of breath swelling legs will be a sensation she denies me know if develops she did have echocardiogram done few months ago which showed normal left ventricle ejection fraction.  No dizziness no passing out  Past Medical History:  Diagnosis Date   A-fib [facility])    history of   Acquired flat foot    Arthritis    feet ankles, and knees    Bilateral ankle pain 10/15/2017   Cancer of kidney parenchyma, right (HCC) 09/08/2018   Chest tightness 05/28/2014   Coronary artery disease minimal disease based on coronary CT angio from spring 2024, calcium  score 47.2 09/25/2022   Depression    Diverticulosis    Dyslipidemia 06/20/2023   Dyspnea    with exertion    Dysrhythmia    hx of afib with hyperthroid no issues currently    Essential hypertension 05/12/2018   GERD (gastroesophageal reflux disease)    H/O hiatal hernia 08/09/2018   Moderate to Large   Hyperlipidemia    Hypertension    Hypothyroidism    IDA (iron  deficiency anemia) 07/23/2016   Iron  malabsorption 11/22/2017   Low vitamin B12 level 03/15/2016   Numbness    Left side of face  around mouth - unknown etiology   Numbness and tingling    left side - facial   Numbness and tingling of left side of face 05/06/2018   Palpitations    Occurred during hyperthyroid   PONV (postoperative nausea and vomiting)    Postsurgical hypothyroidism 07/24/2013   Right renal mass 07/2018   2.1 cm solid heterogeneously enhancing mass in posterior midpole    Seizure disorder (HCC) 05/25/2019   Resolved by age 47, no further recurrences.  Exact diagnosis unknown     Sinus tachycardia by electrocardiogram 05/12/2018   Supraventricular tachycardia (HCC) 10/03/2021   Thrombocythemia    Thrombocytosis 11/22/2017   Thyroid  cancer (HCC)    Thyroid  disease    hyperthyroidism   Ventricular ectopy 10/03/2021   Vitamin B 12 deficiency    Vitamin D  deficiency     Past Surgical History:  Procedure Laterality Date   BREAST BIOPSY     age 67 - negative   CHOLECYSTECTOMY  1992   OPERATIVE ULTRASOUND Right 09/25/2018   Procedure: OPERATIVE ULTRASOUND;  Surgeon: Renda Glance, MD;  [facility];  Service: Urology;  Laterality: Right;   ROBOTIC ASSITED PARTIAL NEPHRECTOMY Right 09/25/2018   Procedure: XI ROBOTIC RIGHT  ASSITED PARTIAL NEPHRECTOMY;  Surgeon: Renda Glance, MD;  [facility];  Service: Urology;  Laterality: Right;   THYROIDECTOMY N/A 07/03/2013   Procedure: TOTAL  THYROIDECTOMY;  Surgeon: Krystal CHRISTELLA Spinner, MD;  [facility];  Service: General;  Laterality: N/A;    Current Medications: Current Meds  Medication Sig   acetaminophen  (TYLENOL ) 500 MG tablet Take 500 mg by mouth every 6 (six) hours as needed for pain.   albuterol  (VENTOLIN  HFA) 108 (90 Base) MCG/ACT inhaler Inhale 2 puffs into the lungs every 6 (six) hours as needed for wheezing or shortness of breath.   Cholecalciferol 125 MCG (5000 UT) capsule Take 5,000 Units by mouth daily.   Cyanocobalamin  2500 MCG SUBL Place 1 tablet under the tongue every other day.   diltiazem  (CARDIZEM  CD) 180 MG 24 hr capsule Take  1 capsule (180 mg total) by mouth daily.   levothyroxine  (SYNTHROID ) 100 MCG tablet Take 1 tablet (100 mcg total) by mouth daily. Take every other day, alternate with 88 mcg   levothyroxine  (SYNTHROID ) 88 MCG tablet Take 1 tablet (88 mcg total) by mouth every other day. Alternate with the 100mcg dose.   LORazepam  (ATIVAN ) 1 MG tablet Take 1 tablet (1 mg total) by mouth as needed for anxiety or sleep.   metroNIDAZOLE  (METROGEL ) 1 % gel Apply topically daily as needed for facial rash.   rosuvastatin  (CRESTOR ) 20 MG tablet Take 1 tablet (20 mg total) by mouth daily.     Allergies:   Penicillins and Aspirin   Social History   Socioeconomic History   Marital status: Divorced    Spouse name: Not on file   Number of children: 1   Years of education: college   Highest education level: Associate degree: occupational, Scientist, product/process development, or vocational program  Occupational History   Occupation: Charity fundraiser  Tobacco Use   Smoking status: Never   Smokeless tobacco: Never  Vaping Use   Vaping status: Never Used  Substance and Sexual Activity   Alcohol use: No    Alcohol/week: 0.0 standard drinks of alcohol   Drug use: No   Sexual activity: Never  Other Topics Concern   Not on file  Social History Narrative   Lives at home alone.   Right-handed.   2 cups caffeine daily.   Social Drivers of Corporate investment banker Strain: Low Risk  (01/18/2024)   Overall Financial Resource Strain (CARDIA)    Difficulty of Paying Living Expenses: Not hard at all  Food Insecurity: No Food Insecurity (01/18/2024)   Hunger Vital Sign    Worried About Running Out of Food in the Last Year: Never true    Ran Out of Food in the Last Year: Never true  Transportation Needs: No Transportation Needs (01/18/2024)   PRAPARE - Administrator, Civil Service (Medical): No    Lack of Transportation (Non-Medical): No  Physical Activity: Insufficiently Active (01/18/2024)   Exercise Vital Sign    Days of Exercise per Week: 3  days    Minutes of Exercise per Session: 30 min  Stress: Stress Concern Present (01/18/2024)   [employer] of Occupational Health - Occupational Stress Questionnaire    Feeling of Stress: To some extent  Social Connections: Moderately Isolated (01/18/2024)   Social Connection and Isolation Panel    Frequency of Communication with Friends and Family: More than three times a week    Frequency of Social Gatherings with Friends and Family: Twice a week    Attends Religious Services: More than 4 times per year    Active Member of [employer] or Organizations: No    Attends Banker Meetings: Not on file  Marital Status: Divorced     Family History: The patient's family history includes CAD in her father; COPD in her mother; Congestive Heart Failure in her paternal grandmother; Diabetes in her father, maternal grandmother, and mother; Heart attack in her maternal grandfather and paternal grandfather; Hyperlipidemia in her father and mother; Hypertension in her father and mother; Mental illness in her father and maternal grandmother; Stroke in her maternal grandfather and maternal grandmother. ROS:   Please see the history of present illness.    All 14 point review of systems negative except as described per history of present illness  EKGs/Labs/Other Studies Reviewed:    EKG Interpretation Date/Time:  Thursday January 23 2024 14:12:42 EDT Ventricular Rate:  83 PR Interval:  156 QRS Duration:  84 QT Interval:  392 QTC Calculation: 460 R Axis:   9  Text Interpretation: Sinus rhythm with occasional Premature ventricular complexes Cannot rule out Anterior infarct , age undetermined No previous ECGs available Confirmed by Bernie Charleston [phone]) on 01/23/2024 2:29:26 PM    Recent Labs: 07/25/2023: Magnesium 2.3 10/31/2023: ALT 12; BUN 15; Creatinine 0.83; Hemoglobin 14.5; Platelets 444; Potassium 4.3; Sodium 141 01/22/2024: TSH 0.79  Recent Lipid Panel    Component Value Date/Time    CHOL 145 07/25/2023 1201   CHOL 236 (H) 10/11/2022 1353   TRIG 106.0 07/25/2023 1201   HDL 46.70 07/25/2023 1201   HDL 61 10/11/2022 1353   CHOLHDL 3 07/25/2023 1201   VLDL 21.2 07/25/2023 1201   LDLCALC 77 07/25/2023 1201   LDLCALC 157 (H) 10/11/2022 1353   LDLCALC 123 (H) 09/02/2019 1511    Physical Exam:    VS:  BP 122/82   Pulse 83   Ht 5' 3 (1.6 m)   Wt 211 lb 0.6 oz (95.7 kg)   SpO2 97%   BMI 37.38 kg/m     Wt Readings from Last 3 Encounters:  01/23/24 211 lb 0.6 oz (95.7 kg)  01/22/24 213 lb (96.6 kg)  10/31/23 210 lb (95.3 kg)     GEN:  Well nourished, well developed in no acute distress HEENT: Normal NECK: No JVD; No carotid bruits LYMPHATICS: No lymphadenopathy CARDIAC: RRR, no murmurs, no rubs, no gallops RESPIRATORY:  Clear to auscultation without rales, wheezing or rhonchi  ABDOMEN: Soft, non-tender, non-distended MUSCULOSKELETAL:  No edema; No deformity  SKIN: Warm and dry LOWER EXTREMITIES: no swelling NEUROLOGIC:  Alert and oriented x 3 PSYCHIATRIC:  Normal affect   ASSESSMENT:    1. Dyslipidemia   2. Coronary artery disease involving native coronary artery of native heart without angina pectoris   3. Essential hypertension   4. Supraventricular tachycardia (HCC)   5. Ventricular ectopy    PLAN:    In order of problems listed above:  PVCs high burden will increase dose of calcium  channel blocker today from 180 Cardizem  CD to 240, she did took beta-blocker before which suppress arrhythmia completely but she felt absolutely miserable with his in the future if calcium  channel blocker does not work we may try carvedilol. Coronary disease only mild nonobstructive on appropriate guideline directed medical therapy. Essential hypertension blood pressure well-controlled continue present management. Dyslipidemia she is on Crestor  I did review K PN which show me LDL 77 HDL 46 we will continue present management   Medication Adjustments/Labs and Tests  Ordered: Current medicines are reviewed at length with the patient today.  Concerns regarding medicines are outlined above.  Orders Placed This Encounter  Procedures   EKG 12-Lead   Medication  changes: No orders of the defined types were placed in this encounter.   Signed, Lamar DOROTHA Fitch, MD, [facility] 01/23/2024 2:38 PM    [facility]

## 2024-01-23 NOTE — Patient Instructions (Addendum)
 Medication Instructions:   INCREASE: Cardizem  to 240mg  daily   Lab Work: None Ordered If you have labs (blood work) drawn today and your tests are completely normal, you will receive your results only by: MyChart Message (if you have MyChart) OR A paper copy in the mail If you have any lab test that is abnormal or we need to change your treatment, we will call you to review the results.   Testing/Procedures: None Ordered   Follow-Up: At [facility], you and your health needs are our priority.  As part of our continuing mission to provide you with exceptional heart care, we have created designated Provider Care Teams.  These Care Teams include your primary Cardiologist (physician) and Advanced Practice Providers (APPs -  Physician Assistants and Nurse Practitioners) who all work together to provide you with the care you need, when you need it.  We recommend signing up for the patient portal called MyChart.  Sign up information is provided on this After Visit Summary.  MyChart is used to connect with patients for Virtual Visits (Telemedicine).  Patients are able to view lab/test results, encounter notes, upcoming appointments, etc.  Non-urgent messages can be sent to your provider as well.   To learn more about what you can do with MyChart, go to [email or web address].    Your next appointment:   6 month(s)  The format for your next appointment:   In Person  Provider:   Lamar Fitch, MD    Other Instructions NA

## 2024-01-23 NOTE — Addendum Note (Signed)
 Addended by: ARLOA PLANAS D on: 01/23/2024 02:46 PM   Modules accepted: Orders

## 2024-01-24 ENCOUNTER — Ambulatory Visit: Payer: Self-pay | Admitting: Family Medicine

## 2024-01-24 LAB — MEASLES/MUMPS/RUBELLA IMMUNITY
Mumps IgG: 45.1 [AU]/ml
Rubella: 3.46 {index}
Rubeola IgG: >300 [AU]/ml

## 2024-01-28 ENCOUNTER — Encounter (HOSPITAL_BASED_OUTPATIENT_CLINIC_OR_DEPARTMENT_OTHER): Payer: Self-pay

## 2024-01-28 ENCOUNTER — Ambulatory Visit (HOSPITAL_BASED_OUTPATIENT_CLINIC_OR_DEPARTMENT_OTHER)
Admission: RE | Admit: 2024-01-28 | Discharge: 2024-01-28 | Disposition: A | Discharge status: Home / Self Care | Source: Ambulatory Visit | Attending: Family Medicine | Admitting: Family Medicine

## 2024-01-28 DIAGNOSIS — Z1231 Encounter for screening mammogram for malignant neoplasm of breast: Secondary | ICD-10-CM | POA: Insufficient documentation

## 2024-03-02 ENCOUNTER — Inpatient Hospital Stay (HOSPITAL_BASED_OUTPATIENT_CLINIC_OR_DEPARTMENT_OTHER): Admitting: Medical Oncology

## 2024-03-02 ENCOUNTER — Encounter: Payer: Self-pay | Admitting: Medical Oncology

## 2024-03-02 ENCOUNTER — Ambulatory Visit: Payer: Self-pay | Admitting: Medical Oncology

## 2024-03-02 ENCOUNTER — Inpatient Hospital Stay: Attending: Medical Oncology

## 2024-03-02 VITALS — BP 128/76 | HR 66 | Temp 98.7°F | Resp 18 | Ht 63.0 in | Wt 213.8 lb

## 2024-03-02 DIAGNOSIS — C641 Malignant neoplasm of right kidney, except renal pelvis: Secondary | ICD-10-CM | POA: Insufficient documentation

## 2024-03-02 DIAGNOSIS — D509 Iron deficiency anemia, unspecified: Secondary | ICD-10-CM | POA: Insufficient documentation

## 2024-03-02 DIAGNOSIS — D473 Essential (hemorrhagic) thrombocythemia: Secondary | ICD-10-CM | POA: Insufficient documentation

## 2024-03-02 DIAGNOSIS — R21 Rash and other nonspecific skin eruption: Secondary | ICD-10-CM | POA: Insufficient documentation

## 2024-03-02 DIAGNOSIS — Z880 Allergy status to penicillin: Secondary | ICD-10-CM | POA: Insufficient documentation

## 2024-03-02 DIAGNOSIS — Z79899 Other long term (current) drug therapy: Secondary | ICD-10-CM | POA: Diagnosis not present

## 2024-03-02 DIAGNOSIS — D75839 Thrombocytosis, unspecified: Secondary | ICD-10-CM | POA: Diagnosis not present

## 2024-03-02 DIAGNOSIS — D508 Other iron deficiency anemias: Secondary | ICD-10-CM

## 2024-03-02 DIAGNOSIS — K909 Intestinal malabsorption, unspecified: Secondary | ICD-10-CM

## 2024-03-02 DIAGNOSIS — D500 Iron deficiency anemia secondary to blood loss (chronic): Secondary | ICD-10-CM

## 2024-03-02 LAB — CBC WITH DIFFERENTIAL (CANCER CENTER ONLY)
Abs Immature Granulocytes: 0.05 K/uL (ref 0.00–0.07)
Basophils Absolute: 0.1 K/uL (ref 0.0–0.1)
Basophils Relative: 1 %
Eosinophils Absolute: 0.1 K/uL (ref 0.0–0.5)
Eosinophils Relative: 2 %
HCT: 43.2 % (ref 36.0–46.0)
Hemoglobin: 13.9 g/dL (ref 12.0–15.0)
Immature Granulocytes: 1 %
Lymphocytes Relative: 28 %
Lymphs Abs: 2.3 K/uL (ref 0.7–4.0)
MCH: 27.4 pg (ref 26.0–34.0)
MCHC: 32.2 g/dL (ref 30.0–36.0)
MCV: 85 fL (ref 80.0–100.0)
Monocytes Absolute: 0.8 K/uL (ref 0.1–1.0)
Monocytes Relative: 10 %
Neutro Abs: 4.9 K/uL (ref 1.7–7.7)
Neutrophils Relative %: 58 %
Platelet Count: 411 K/uL — ABNORMAL HIGH (ref 150–400)
RBC: 5.08 MIL/uL (ref 3.87–5.11)
RDW: 15 % (ref 11.5–15.5)
WBC Count: 8.2 K/uL (ref 4.0–10.5)
nRBC: 0 % (ref 0.0–0.2)

## 2024-03-02 LAB — CMP (CANCER CENTER ONLY)
ALT: 14 U/L (ref 0–44)
AST: 19 U/L (ref 15–41)
Albumin: 4.2 g/dL (ref 3.5–5.0)
Alkaline Phosphatase: 66 U/L (ref 38–126)
Anion gap: 11 (ref 5–15)
BUN: 14 mg/dL (ref 8–23)
CO2: 25 mmol/L (ref 22–32)
Calcium: 9.2 mg/dL (ref 8.9–10.3)
Chloride: 104 mmol/L (ref 98–111)
Creatinine: 0.7 mg/dL (ref 0.44–1.00)
GFR, Estimated: >60 mL/min (ref >60)
Glucose, Bld: 96 mg/dL (ref 70–99)
Potassium: 4.6 mmol/L (ref 3.5–5.1)
Sodium: 140 mmol/L (ref 135–145)
Total Bilirubin: 0.4 mg/dL (ref 0.0–1.2)
Total Protein: 7.6 g/dL (ref 6.5–8.1)

## 2024-03-02 LAB — IRON AND IRON BINDING CAPACITY (CC-WL,HP ONLY)
Iron: 78 ug/dL (ref 28–170)
Saturation Ratios: 19 % (ref 10.4–31.8)
TIBC: 405 ug/dL (ref 250–450)
UIBC: 327 ug/dL

## 2024-03-02 LAB — RETIC PANEL
Immature Retic Fract: 10 % (ref 2.3–15.9)
RBC.: 5.15 MIL/uL — ABNORMAL HIGH (ref 3.87–5.11)
Retic Count, Absolute: 81.9 K/uL (ref 19.0–186.0)
Retic Ct Pct: 1.6 % (ref 0.4–3.1)
Reticulocyte Hemoglobin: 29.9 pg (ref >27.9)

## 2024-03-02 LAB — FERRITIN: Ferritin: 93 ng/mL (ref 11–307)

## 2024-03-02 NOTE — Progress Notes (Signed)
 Hematology and Oncology Follow Up Visit   Karen Wall [id number] 08-10-1955 68 y.o. 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   Today she states that stopping her aspirin stopped her rash. She is thrilled.   There has been no bleeding to her knowledge: denies epistaxis, gingivitis, hemoptysis, hematemesis, hematuria, melena, excessive bruising, blood donation.   She continues to be followed by Dr. Renda who managed her kidney cancer history. Last CT scan of chest was on 09/11/2023 which did not show any concern for recurrent disease. This was her last yearly CT scan per patient.   She has had no change in bowel or bladder habits.  She has had no cough or shortness of breath.  There has been no nausea or vomiting. No unintentional weight loss, night sweats, hematuria or flank pain.   Overall, her performance status right now is ECOG 1.    Wt Readings from Last 3 Encounters:  03/02/24 213 lb 12.8 oz (97 kg)  01/23/24 211 lb 0.6 oz (95.7 kg)  01/22/24 213 lb (96.6 kg)     Medications:  Current Outpatient Medications:    acetaminophen  (TYLENOL ) 500 MG tablet, Take 500 mg by mouth every 6 (six) hours as needed for pain., Disp: , Rfl:    amLODipine -benazepril  (LOTREL ) 5-20 MG capsule, Take 1 capsule by mouth at bedtime., Disp: 90 capsule, Rfl: 1   LORazepam  (ATIVAN ) 1 MG tablet, Take 1 tablet (1 mg total) by mouth at bedtime as needed for anxiety or sleep., Disp: 30 tablet, Rfl: 0   lovastatin  (MEVACOR ) 20 MG tablet, Take 1 tablet (20 mg total) by mouth at  bedtime., Disp: 90 tablet, Rfl: 3   thyroid  (ARMOUR THYROID ) 15 MG tablet, Take 1 tablet (15 mg total) by mouth daily. Along with the 60 mg tablet. (Patient taking differently: Take 15 mg by mouth daily before breakfast. Along with the 60 mg tablet.), Disp: 90 tablet, Rfl: 3   thyroid  (ARMOUR THYROID ) 60 MG tablet, Take every day along with the 15 mg tablet. (Patient taking differently: Take 60 mg by mouth daily before breakfast. Take every day along with the 15 mg tablet.), Disp: 90 tablet, Rfl: 3   traMADol  (ULTRAM ) 50 MG tablet, Take 1-2 tablets (50-100 mg total) by mouth every 6 (six) hours as needed for moderate pain or severe pain., Disp: 20 tablet, Rfl: 0   Allergies:       Allergies  Allergen Reactions   Penicillins Shortness Of Breath      Did it involve swelling of the face/tongue/throat, SOB, or low BP? Yes Did it involve sudden or severe rash/hives, skin peeling, or any reaction on the inside of your mouth or nose? No Did you need to seek medical attention at a hospital or doctor's office? Unknown When did it last happen?  44-79 years old  If all above answers are "NO", may proceed with cephalosporin use.        Past Medical History, Surgical history, Social history, and Family History were reviewed and updated.   Review of Systems: Review of Systems  Constitutional: Negative.   HENT:  Negative.   Eyes: Negative.   Respiratory: Negative.   Cardiovascular: Negative.   Gastrointestinal: Negative.   Endocrine: Negative.   Genitourinary: Negative.    Musculoskeletal: Negative.   Skin: Negative.   Neurological: Negative.   Hematological: Negative.   Psychiatric/Behavioral: Negative.       Physical Exam: Vitals:   03/02/24 1015  BP: 128/76  Pulse: 66  Resp: 18  Temp: 98.7 F (37.1 C)  SpO2: 98%   Wt Readings from Last 3 Encounters:  03/02/24 213 lb 12.8 oz (97 kg)  01/23/24 211 lb 0.6 oz (95.7 kg)  01/22/24 213 lb (96.6 kg)    Physical Exam Vitals signs  reviewed.  HENT:     Head: Normocephalic and atraumatic.  Eyes:     Pupils: Pupils are equal, round, and reactive to light.  Neck:     Musculoskeletal: Normal range of motion.  Cardiovascular:     Rate and Rhythm: Normal rate and regular rhythm.     Heart sounds: Normal heart sounds.  Pulmonary:     Effort: Pulmonary effort is normal.     Breath sounds: Normal breath sounds.  Abdominal:     General: Bowel sounds are normal.     Palpations: Abdomen is soft.  Musculoskeletal: Normal range of motion.        General: No tenderness or deformity.  Lymphadenopathy:     Cervical: No cervical adenopathy.  Skin:    General: Skin is warm and dry.     Findings: No erythema or rash.  Neurological:     Mental Status: She is alert and oriented to person, place, and time.  Psychiatric:        Behavior: Behavior normal.        Thought Content: Thought content normal.        Judgment: Judgment normal.        Recent Labs       Lab Results  Component Value Date    WBC 7.3 09/23/2018    HGB 12.0 09/26/2018    HCT 39.4 09/26/2018    MCV 87.6 09/23/2018    PLT 479 (H) 09/23/2018        Chemistry    Labs (Brief)          Component Value Date/Time    NA 137 09/26/2018 0339    K 4.0 09/26/2018 0339    CL 105 09/26/2018 0339    CO2 24 09/26/2018 0339    BUN 9 09/26/2018 0339    CREATININE 0.70 09/26/2018 0339    CREATININE 0.81 09/08/2018 1148    CREATININE 0.74 05/18/2015 1356      Labs (Brief)          Component Value Date/Time    CALCIUM  8.6 (L) 09/26/2018 0339    ALKPHOS 52 09/08/2018 1148    AST 13 (L) 09/08/2018 1148    ALT 13 09/08/2018 1148    BILITOT 0.2 (L) 09/08/2018 1148         Encounter Diagnoses  Name Primary?   Iron  deficiency anemia secondary to inadequate dietary iron  intake Yes   Thrombocytosis    Cancer of kidney parenchyma, right (HCC)      Impression and Plan: Karen Wall is a  68 y.o. caucasian female.    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 She is s/p right nephrectomy- 09/2018. She is followed by Dr. Renda for this. Final surveillance imaging is scheduled for next year per patient.   Thrombocythemia: Essential Thrombocythemia, has IDA component as well. Has Hydrea  but has not started this as iron  has helped to lower her counts. Currently her Platelet level is 411 which continues to reduce on its own. She does not need to start the Hydrea  at this time.   IDA: Multifactorial. S/p recent Cologuard which was negative. She has elected to forgo endoscopy or capsule studies at this time after discussion with PCP- given current yearly CT/MR abdominal/chest imaging. No other blood loss noted. Has done well with iron  infusion. Hgb stable today at 13.9. Normal MCV. Iron  studied pending  Rash: Resolved with cessation of asa.   Disposition: Iron  studies pending. Will replace if needed  RTC 4 months APP, labs(CBC w/, CMP, iron , ferritin, retic)  Lauraine HERO [facility] PA-C  6/11/202012:28 PM

## 2024-03-16 ENCOUNTER — Other Ambulatory Visit: Payer: Self-pay | Admitting: Family Medicine

## 2024-03-16 DIAGNOSIS — F329 Major depressive disorder, single episode, unspecified: Secondary | ICD-10-CM

## 2024-03-17 ENCOUNTER — Other Ambulatory Visit (HOSPITAL_BASED_OUTPATIENT_CLINIC_OR_DEPARTMENT_OTHER): Payer: Self-pay

## 2024-03-17 MED ORDER — LORAZEPAM 1 MG PO TABS
1.0000 mg | ORAL_TABLET | ORAL | 1 refills | Status: AC | PRN
  Filled 2024-03-17: qty 30, 30d supply, fill #0
  Filled 2024-04-17: qty 30, 30d supply, fill #1

## 2024-04-17 ENCOUNTER — Other Ambulatory Visit: Payer: Self-pay | Admitting: Family Medicine

## 2024-04-17 ENCOUNTER — Other Ambulatory Visit (HOSPITAL_BASED_OUTPATIENT_CLINIC_OR_DEPARTMENT_OTHER): Payer: Self-pay

## 2024-04-17 ENCOUNTER — Other Ambulatory Visit: Payer: Self-pay

## 2024-04-17 DIAGNOSIS — E890 Postprocedural hypothyroidism: Secondary | ICD-10-CM

## 2024-04-17 MED ORDER — LEVOTHYROXINE SODIUM 88 MCG PO TABS
88.0000 ug | ORAL_TABLET | ORAL | 1 refills | Status: AC
  Filled 2024-04-17: qty 45, 90d supply, fill #0
  Filled 2024-06-29 – 2024-07-20 (×4): qty 45, 90d supply, fill #1
  Filled ????-??-??: fill #1

## 2024-06-09 ENCOUNTER — Other Ambulatory Visit (HOSPITAL_BASED_OUTPATIENT_CLINIC_OR_DEPARTMENT_OTHER): Payer: Self-pay

## 2024-06-09 ENCOUNTER — Other Ambulatory Visit: Payer: Self-pay | Admitting: Cardiology

## 2024-06-09 DIAGNOSIS — I2510 Atherosclerotic heart disease of native coronary artery without angina pectoris: Secondary | ICD-10-CM

## 2024-06-09 DIAGNOSIS — E785 Hyperlipidemia, unspecified: Secondary | ICD-10-CM

## 2024-06-09 MED ORDER — ROSUVASTATIN CALCIUM 20 MG PO TABS
20.0000 mg | ORAL_TABLET | Freq: Every day | ORAL | 3 refills | Status: AC
  Filled 2024-06-09 – 2024-06-22 (×2): qty 90, 90d supply, fill #0

## 2024-06-19 ENCOUNTER — Other Ambulatory Visit (HOSPITAL_BASED_OUTPATIENT_CLINIC_OR_DEPARTMENT_OTHER): Payer: Self-pay

## 2024-06-22 ENCOUNTER — Other Ambulatory Visit (HOSPITAL_BASED_OUTPATIENT_CLINIC_OR_DEPARTMENT_OTHER): Payer: Self-pay

## 2024-06-22 ENCOUNTER — Other Ambulatory Visit: Payer: Self-pay

## 2024-06-29 ENCOUNTER — Other Ambulatory Visit (HOSPITAL_BASED_OUTPATIENT_CLINIC_OR_DEPARTMENT_OTHER): Payer: Self-pay

## 2024-06-29 ENCOUNTER — Other Ambulatory Visit: Payer: Self-pay

## 2024-06-30 ENCOUNTER — Other Ambulatory Visit (HOSPITAL_BASED_OUTPATIENT_CLINIC_OR_DEPARTMENT_OTHER): Payer: Self-pay

## 2024-06-30 ENCOUNTER — Ambulatory Visit: Payer: Medicare HMO

## 2024-06-30 VITALS — BP 120/62 | HR 72 | Temp 97.9°F | Ht 63.0 in | Wt 211.6 lb

## 2024-06-30 DIAGNOSIS — Z0000 Encounter for general adult medical examination without abnormal findings: Secondary | ICD-10-CM

## 2024-06-30 NOTE — Progress Notes (Signed)
 Chief Complaint  Patient presents with   Medicare Wellness     Subjective:   Karen Wall is a 68 y.o. female who presents for a Medicare Annual Wellness Visit.  Visit info / Clinical Intake: Medicare Wellness Visit Type:: Subsequent Annual Wellness Visit Persons participating in visit and providing information:: patient Medicare Wellness Visit Mode:: In-person (required for WTM) Interpreter Needed?: No Pre-visit prep was completed: yes AWV questionnaire completed by patient prior to visit?: yes Date:: 06/27/24 Living arrangements:: (!) lives alone Patient's Overall Health Status Rating: (!) fair Typical amount of pain: some (Followed by medical attention) Does pain affect daily life?: (!) yes (Followed by medical attention) Are you currently prescribed opioids?: no  Dietary Habits and Nutritional Risks How many meals a day?: 3 Eats fruit and vegetables daily?: yes Most meals are obtained by: preparing own meals Diabetic:: no  Functional Status Activities of Daily Living (to include ambulation/medication): Independent Ambulation: Independent with device- listed below Home Assistive Devices/Equipment: Eyeglasses; Brace (specify type) (Ankle Braces) Medication Administration: Independent Home Management (perform basic housework or laundry): Independent Manage your own finances?: yes Primary transportation is: driving Concerns about vision?: no *vision screening is required for WTM* Concerns about hearing?: no  Fall Screening Falls in the past year?: 1 Number of falls in past year: 0 Was there an injury with Fall?: 0 Fall Risk Category Calculator: 1 Patient Fall Risk Level: Low Fall Risk  Fall Risk Patient at Risk for Falls Due to: No Fall Risks Fall risk Follow up: Falls evaluation completed  Home and Transportation Safety: All rugs have non-skid backing?: N/A, no rugs All stairs or steps have railings?: yes Grab bars in the bathtub or shower?: yes Have  non-skid surface in bathtub or shower?: yes Good home lighting?: yes Regular seat belt use?: yes Hospital stays in the last year:: no  Cognitive Assessment Difficulty concentrating, remembering, or making decisions? : yes Will 6CIT or Mini Cog be Completed: yes What year is it?: 0 points What month is it?: 0 points Give patient an address phrase to remember (5 components): [address] [place]  About what time is it?: 0 points Count backwards from 20 to 1: 0 points Say the months of the year in reverse: 0 points Repeat the address phrase from earlier: 0 points 6 CIT Score: 0 points  Advance Directives (For Healthcare) Does Patient Have a Medical Advance Directive?: Yes Does patient want to make changes to medical advance directive?: No - Patient declined Type of Advance Directive: Healthcare Power of [place]; Living will Copy of Healthcare Power of Attorney in Chart?: No - copy requested Copy of Living Will in Chart?: No - copy requested Would patient like information on creating a medical advance directive?: No - Patient declined  Reviewed/Updated  Reviewed/Updated: Reviewed All (Medical, Surgical, Family, Medications, Allergies, Care Teams, Patient Goals)    Allergies (verified) Penicillins and Aspirin   Current Medications (verified) Outpatient Encounter Medications as of 06/30/2024  Medication Sig   acetaminophen  (TYLENOL ) 500 MG tablet Take 500 mg by mouth every 6 (six) hours as needed for pain.   albuterol  (VENTOLIN  HFA) 108 (90 Base) MCG/ACT inhaler Inhale 2 puffs into the lungs every 6 (six) hours as needed for wheezing or shortness of breath.   Cholecalciferol 125 MCG (5000 UT) capsule Take 5,000 Units by mouth daily.   Cyanocobalamin  2500 MCG SUBL Place 1 tablet under the tongue every other day.   diltiazem  (CARDIZEM  CD) 240 MG 24 hr capsule Take 1 capsule (  240 mg total) by mouth daily.   hydroxyurea  (HYDREA ) 500 MG capsule Take 1 capsule (500 mg total) by  mouth daily. May take with food to minimize GI side effects. (Patient not taking: Reported on 03/02/2024)   levothyroxine  (SYNTHROID ) 100 MCG tablet Take 1 tablet (100 mcg total) by mouth daily. Take every other day, alternate with 88 mcg   levothyroxine  (SYNTHROID ) 88 MCG tablet Take 1 tablet (88 mcg total) by mouth every other day. Alternate with the 100mcg dose.   LORazepam  (ATIVAN ) 1 MG tablet Take 1 tablet (1 mg total) by mouth as needed for anxiety or sleep.   metroNIDAZOLE  (METROGEL ) 1 % gel Apply topically daily as needed for facial rash.   rosuvastatin  (CRESTOR ) 20 MG tablet Take 1 tablet (20 mg total) by mouth daily.   No facility-administered encounter medications on file as of 06/30/2024.    History: Past Medical History:  Diagnosis Date   A-fib [facility])    history of   Acquired flat foot    Arthritis    feet ankles, and knees    Bilateral ankle pain 10/15/2017   Cancer of kidney parenchyma, right (HCC) 09/08/2018   Chest tightness 05/28/2014   Coronary artery disease minimal disease based on coronary CT angio from spring 2024, calcium  score 47.2 09/25/2022   Depression    Diverticulosis    Dyslipidemia 06/20/2023   Dyspnea    with exertion    Dysrhythmia    hx of afib with hyperthroid no issues currently    Essential hypertension 05/12/2018   GERD (gastroesophageal reflux disease)    H/O hiatal hernia 08/09/2018   Moderate to Large   Hyperlipidemia    Hypertension    Hypothyroidism    IDA (iron  deficiency anemia) 07/23/2016   Iron  malabsorption 11/22/2017   Low vitamin B12 level 03/15/2016   Numbness    Left side of face around mouth - unknown etiology   Numbness and tingling    left side - facial   Numbness and tingling of left side of face 05/06/2018   Palpitations    Occurred during hyperthyroid   PONV (postoperative nausea and vomiting)    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   Vitamin B 12 deficiency    Vitamin D  deficiency    Past Surgical History:  Procedure Laterality Date   BREAST BIOPSY     age 77 - negative   CHOLECYSTECTOMY  1992   OPERATIVE ULTRASOUND Right 09/25/2018   Procedure: OPERATIVE ULTRASOUND;  Surgeon: Renda Glance, MD;  Location: [facility];  Service: Urology;  Laterality: Right;   ROBOTIC ASSITED PARTIAL NEPHRECTOMY Right 09/25/2018   Procedure: XI ROBOTIC RIGHT  ASSITED PARTIAL NEPHRECTOMY;  Surgeon: Renda Glance, MD;  Location: [facility];  Service: Urology;  Laterality: Right;   THYROIDECTOMY N/A 07/03/2013   Procedure: TOTAL THYROIDECTOMY;  Surgeon: Krystal CHRISTELLA Spinner, MD;  Location: [facility];  Service: General;  Laterality: N/A;   Family History  Problem Relation Age of Onset   Hypertension Mother    Diabetes Mother    Hyperlipidemia Mother  COPD Mother    CAD Father        MI in his 59s   Diabetes Father    Hyperlipidemia Father    Hypertension Father    Mental illness Father    Mental illness Maternal Grandmother    Stroke Maternal Grandmother    Diabetes Maternal Grandmother    Stroke Maternal Grandfather    Heart attack Maternal Grandfather    Congestive Heart Failure Paternal Grandmother    Heart attack Paternal Grandfather    Social History   Occupational History   Occupation: [employer]  Tobacco Use   Smoking status: Never   Smokeless tobacco: Never  Vaping Use   Vaping status: Never Used  Substance and Sexual Activity   Alcohol use: No    Alcohol/week: 0.0 standard drinks of alcohol   Drug use: No   Sexual activity: Never   Tobacco Counseling Counseling  given: No  SDOH Screenings   Food Insecurity: No Food Insecurity (06/30/2024)  Housing: Low Risk (06/30/2024)  Transportation Needs: No Transportation Needs (06/30/2024)  Utilities: Not At Risk (06/30/2024)  Alcohol Screen: Low Risk (06/11/2022)  Depression (PHQ2-9): Low Risk (06/30/2024)  Financial Resource Strain: Low Risk (06/27/2024)  Physical Activity: Insufficiently Active (06/30/2024)  Social Connections: Unknown (06/30/2024)  Stress: Stress Concern Present (06/30/2024)  Tobacco Use: Low Risk (06/30/2024)  Health Literacy: Adequate Health Literacy (06/30/2024)   See flowsheets for full screening details  Depression Screen PHQ 2 & 9 Depression Scale- Over the past 2 weeks, how often have you been bothered by any of the following problems? Little interest or pleasure in doing things: 0 Feeling down, depressed, or hopeless (PHQ Adolescent also includes...irritable): 0 PHQ-2 Total Score: 0 Trouble falling or staying asleep, or sleeping too much: 1 Feeling tired or having little energy: 1 Poor appetite or overeating (PHQ Adolescent also includes...weight loss): 1 Feeling bad about yourself - or that you are a failure or have let yourself or your family down: 0 Trouble concentrating on things, such as reading the newspaper or watching television (PHQ Adolescent also includes...like school work): 0 Moving or speaking so slowly that other people could have noticed. Or the opposite - being so fidgety or restless that you have been moving around a lot more than usual: 0 Thoughts that you would be better off dead, or of hurting yourself in some way: 0 PHQ-9 Total Score: 3 If you checked off any problems, how difficult have these problems made it for you to do your work, take care of things at home, or get along with other people?: Not difficult at all  Depression Treatment Depression Interventions/Treatment : EYV7-0 Score <4 Follow-up Not Indicated     Goals Addressed                This Visit's Progress     Remain active (pt-stated)        Continue losing weight             Objective:    Today's Vitals   06/30/24 1001  BP: 120/62  Pulse: 72  Temp: 97.9 F (36.6 C)  TempSrc: Oral  SpO2: 99%  Weight: 211 lb 9.6 oz (96 kg)  Height: 5' 3 (1.6 m)   Body mass index is 37.48 kg/m.  Hearing/Vision screen Hearing Screening - Comments:: Denies hearing difficulties   Vision Screening - Comments:: Wears rx glasses - up to date with routine eye exams with  [facility] Immunizations and Health Maintenance Health Maintenance  Topic Date  Due   Zoster Vaccines- Shingrix (1 of 2) Never done   Influenza Vaccine  02/14/2024   COVID-19 Vaccine (4 - 2025-26 season) 03/16/2024   Fecal DNA (Cologuard)  04/10/2025   Medicare Annual Wellness (AWV)  06/30/2025   Mammogram  01/27/2026   DTaP/Tdap/Td (2 - Td or Tdap) 09/01/2028   Pneumococcal Vaccine: 50+ Years  Completed   Bone Density Scan  Completed   Hepatitis C Screening  Completed   Meningococcal B Vaccine  Aged Out        Assessment/Plan:  This is a routine wellness examination for Ikeya.  Patient Care Team: Copland, Harlene BROCKS, MD as PCP - General (Family Medicine) Tonette Lauraine CHRISTELLA DEVONNA as Physician Assistant (Medical Oncology) Bernie Lamar PARAS, MD as Consulting Physician (Cardiology)  I have personally reviewed and noted the following in the patients chart:   Medical and social history Use of alcohol, tobacco or illicit drugs  Current medications and supplements including opioid prescriptions. Functional ability and status Nutritional status Physical activity Advanced directives List of other physicians Hospitalizations, surgeries, and ER visits in previous 12 months Vitals Screenings to include cognitive, depression, and falls Referrals and appointments  No orders of the defined types were placed in this encounter.  In addition, I have reviewed and discussed with patient  certain preventive protocols, quality metrics, and best practice recommendations. A written personalized care plan for preventive services as well as general preventive health recommendations were provided to patient.   Rojelio LELON Blush, LPN   [id number]   Return in 53 weeks (on 07/06/2025).  After Visit Summary: (In Person-Declined) Patient declined AVS at this time.  Nurse Notes: HM Addressed: Patient deferred Vaccines

## 2024-06-30 NOTE — Patient Instructions (Addendum)
 Karen Wall,  Thank you for taking the time for your Medicare Wellness Visit. I appreciate your continued commitment to your health goals. Please review the care plan we discussed, and feel free to reach out if I can assist you further.  Please note that Annual Wellness Visits do not include a physical exam. Some assessments may be limited, especially if the visit was conducted virtually. If needed, we may recommend an in-person follow-up with your provider.  Ongoing Care Seeing your primary care provider every 3 to 6 months helps us  monitor your health and provide consistent, personalized care.    Referrals If a referral was made during today's visit and you haven't received any updates within two weeks, please contact the referred provider directly to check on the status.  Recommended Screenings:  Health Maintenance  Topic Date Due   Zoster (Shingles) Vaccine (1 of 2) Never done   Flu Shot  02/14/2024   COVID-19 Vaccine (4 - 2025-26 season) 03/16/2024   Cologuard (Stool DNA test)  04/10/2025   Medicare Annual Wellness Visit  06/30/2025   Breast Cancer Screening  01/27/2026   DTaP/Tdap/Td vaccine (2 - Td or Tdap) 09/01/2028   Pneumococcal Vaccine for age over 60  Completed   Osteoporosis screening with Bone Density Scan  Completed   Hepatitis C Screening  Completed   Meningitis B Vaccine  Aged Out       06/30/2024   10:08 AM  Advanced Directives  Does Patient Have a Medical Advance Directive? Yes  Type of Estate Agent of [place];Living will  Does patient want to make changes to medical advance directive? No - Patient declined  Copy of Healthcare Power of Attorney in Chart? No - copy requested    Vision: Annual vision screenings are recommended for early detection of glaucoma, cataracts, and diabetic retinopathy. These exams can also reveal signs of chronic conditions such as diabetes and high blood pressure.  Dental: Annual dental screenings help  detect early signs of oral cancer, gum disease, and other conditions linked to overall health, including heart disease and diabetes.  Please see the attached documents for additional preventive care recommendations.

## 2024-07-02 ENCOUNTER — Inpatient Hospital Stay: Attending: Medical Oncology

## 2024-07-02 ENCOUNTER — Ambulatory Visit: Admitting: Medical Oncology

## 2024-07-02 ENCOUNTER — Encounter: Payer: Self-pay | Admitting: Medical Oncology

## 2024-07-02 VITALS — BP 105/74 | HR 87 | Temp 97.5°F | Resp 17 | Ht 63.0 in | Wt 211.0 lb

## 2024-07-02 DIAGNOSIS — D751 Secondary polycythemia: Secondary | ICD-10-CM | POA: Diagnosis not present

## 2024-07-02 DIAGNOSIS — D508 Other iron deficiency anemias: Secondary | ICD-10-CM

## 2024-07-02 DIAGNOSIS — D75839 Thrombocytosis, unspecified: Secondary | ICD-10-CM | POA: Diagnosis not present

## 2024-07-02 DIAGNOSIS — D473 Essential (hemorrhagic) thrombocythemia: Secondary | ICD-10-CM | POA: Diagnosis present

## 2024-07-02 DIAGNOSIS — C641 Malignant neoplasm of right kidney, except renal pelvis: Secondary | ICD-10-CM

## 2024-07-02 LAB — CBC WITH DIFFERENTIAL (CANCER CENTER ONLY)
Abs Immature Granulocytes: 0.02 K/uL (ref 0.00–0.07)
Basophils Absolute: 0 K/uL (ref 0.0–0.1)
Basophils Relative: 1 %
Eosinophils Absolute: 0.1 K/uL (ref 0.0–0.5)
Eosinophils Relative: 1 %
HCT: 47.1 % — ABNORMAL HIGH (ref 36.0–46.0)
Hemoglobin: 15.1 g/dL — ABNORMAL HIGH (ref 12.0–15.0)
Immature Granulocytes: 0 %
Lymphocytes Relative: 27 %
Lymphs Abs: 2.1 K/uL (ref 0.7–4.0)
MCH: 27.2 pg (ref 26.0–34.0)
MCHC: 32.1 g/dL (ref 30.0–36.0)
MCV: 84.7 fL (ref 80.0–100.0)
Monocytes Absolute: 0.7 K/uL (ref 0.1–1.0)
Monocytes Relative: 10 %
Neutro Abs: 4.7 K/uL (ref 1.7–7.7)
Neutrophils Relative %: 61 %
Platelet Count: 453 K/uL — ABNORMAL HIGH (ref 150–400)
RBC: 5.56 MIL/uL — ABNORMAL HIGH (ref 3.87–5.11)
RDW: 14.8 % (ref 11.5–15.5)
WBC Count: 7.6 K/uL (ref 4.0–10.5)
nRBC: 0 % (ref 0.0–0.2)

## 2024-07-02 LAB — IRON AND IRON BINDING CAPACITY (CC-WL,HP ONLY)
Iron: 78 ug/dL (ref 28–170)
Saturation Ratios: 22 % (ref 10.4–31.8)
TIBC: 356 ug/dL (ref 250–450)
UIBC: 278 ug/dL

## 2024-07-02 LAB — CMP (CANCER CENTER ONLY)
ALT: 16 U/L (ref 0–44)
AST: 21 U/L (ref 15–41)
Albumin: 4.2 g/dL (ref 3.5–5.0)
Alkaline Phosphatase: 65 U/L (ref 38–126)
Anion gap: 12 (ref 5–15)
BUN: 12 mg/dL (ref 8–23)
CO2: 27 mmol/L (ref 22–32)
Calcium: 9.1 mg/dL (ref 8.9–10.3)
Chloride: 102 mmol/L (ref 98–111)
Creatinine: 0.8 mg/dL (ref 0.44–1.00)
GFR, Estimated: >60 mL/min (ref >60)
Glucose, Bld: 106 mg/dL — ABNORMAL HIGH (ref 70–99)
Potassium: 4.5 mmol/L (ref 3.5–5.1)
Sodium: 140 mmol/L (ref 135–145)
Total Bilirubin: 0.3 mg/dL (ref 0.0–1.2)
Total Protein: 7.9 g/dL (ref 6.5–8.1)

## 2024-07-02 LAB — FERRITIN: Ferritin: 113 ng/mL (ref 11–307)

## 2024-07-02 LAB — RETIC PANEL
Immature Retic Fract: 8.5 % (ref 2.3–15.9)
RBC.: 5.45 MIL/uL — ABNORMAL HIGH (ref 3.87–5.11)
Retic Count, Absolute: 71.9 K/uL (ref 19.0–186.0)
Retic Ct Pct: 1.3 % (ref 0.4–3.1)
Reticulocyte Hemoglobin: 30.5 pg (ref >27.9)

## 2024-07-02 NOTE — Progress Notes (Signed)
 Hematology and Oncology Follow Up Visit   Karen Wall [id number] 06/13/1956 68 y.o. 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   Today she states that she is doing ok. She is about to have her 69 month old grandson stay with her for Christmas. She is so excited about this!   Unfortunately her 84 year old cat was just diagnosed with diabetes. She is hopeful that she will be able to give him his insulin.   Fatigue is stable. She does not smoke. She may snore. She does wake up with morning headaches. She does wake up during the night.   There has been no bleeding to her knowledge: denies epistaxis, gingivitis, hemoptysis, hematemesis, hematuria, melena, excessive bruising, blood donation.   She continues to be followed by Dr. Renda who managed her kidney cancer history. Last CT scan of chest was on 09/11/2023 which did not show any concern for recurrent disease. This was her last yearly CT scan per patient.   She has had no change in bowel or bladder habits.  She has had no cough or shortness of breath.  There has been no nausea or vomiting. No unintentional weight loss, night sweats, hematuria or flank pain.   Overall, her performance status right now is ECOG 1.    Wt Readings from Last 3 Encounters:  07/02/24 211 lb (95.7 kg)  06/30/24 211 lb 9.6 oz (96 kg)  03/02/24 213 lb 12.8 oz (97 kg)     Medications:  Current Outpatient Medications:    acetaminophen  (TYLENOL ) 500 MG tablet, Take 500 mg by mouth every 6 (six) hours as needed for pain., Disp: , Rfl:     amLODipine -benazepril  (LOTREL ) 5-20 MG capsule, Take 1 capsule by mouth at bedtime., Disp: 90 capsule, Rfl: 1   LORazepam  (ATIVAN ) 1 MG tablet, Take 1 tablet (1 mg total) by mouth at bedtime as needed for anxiety or sleep., Disp: 30 tablet, Rfl: 0   lovastatin  (MEVACOR ) 20 MG tablet, Take 1 tablet (20 mg total) by mouth at bedtime., Disp: 90 tablet, Rfl: 3   thyroid  (ARMOUR THYROID ) 15 MG tablet, Take 1 tablet (15 mg total) by mouth daily. Along with the 60 mg tablet. (Patient taking differently: Take 15 mg by mouth daily before breakfast. Along with the 60 mg tablet.), Disp: 90 tablet, Rfl: 3   thyroid  (ARMOUR THYROID ) 60 MG tablet, Take every day along with the 15 mg tablet. (Patient taking differently: Take 60 mg by mouth daily before breakfast. Take every day along with the 15 mg tablet.), Disp: 90 tablet, Rfl: 3   traMADol  (ULTRAM ) 50 MG tablet, Take 1-2 tablets (50-100 mg total) by mouth every 6 (six) hours as needed for moderate pain or severe pain., Disp: 20 tablet, Rfl: 0   Allergies:       Allergies  Allergen Reactions  Penicillins Shortness Of Breath      Did it involve swelling of the face/tongue/throat, SOB, or low BP? Yes Did it involve sudden or severe rash/hives, skin peeling, or any reaction on the inside of your mouth or nose? No Did you need to seek medical attention at a hospital or doctor's office? Unknown When did it last happen?  83-47 years old     If all above answers are NO, may proceed with cephalosporin use.        Past Medical History, Surgical history, Social history, and Family History were reviewed and updated.   Review of Systems: Review of Systems  Constitutional: Negative.   HENT:  Negative.   Eyes: Negative.   Respiratory: Negative.   Cardiovascular: Negative.   Gastrointestinal: Negative.   Endocrine: Negative.   Genitourinary: Negative.    Musculoskeletal: Negative.   Skin: Negative.   Neurological: Negative.   Hematological: Negative.    Psychiatric/Behavioral: Negative.       Physical Exam: Vitals:   07/02/24 1130  BP: 105/74  Pulse: 87  Resp: 17  Temp: (!) 97.5 F (36.4 C)  SpO2: 100%   Wt Readings from Last 3 Encounters:  07/02/24 211 lb (95.7 kg)  06/30/24 211 lb 9.6 oz (96 kg)  03/02/24 213 lb 12.8 oz (97 kg)    Physical Exam Vitals signs reviewed.  HENT:     Head: Normocephalic and atraumatic.  Eyes:     Pupils: Pupils are equal, round, and reactive to light.  Neck:     Musculoskeletal: Normal range of motion.  Cardiovascular:     Rate and Rhythm: Normal rate and regular rhythm.     Heart sounds: Normal heart sounds.  Pulmonary:     Effort: Pulmonary effort is normal.     Breath sounds: Normal breath sounds.  Abdominal:     General: Bowel sounds are normal.     Palpations: Abdomen is soft.  Musculoskeletal: Normal range of motion.        General: No tenderness or deformity.  Lymphadenopathy:     Cervical: No cervical adenopathy.  Skin:    General: Skin is warm and dry.     Findings: No erythema or rash.  Neurological:     Mental Status: She is alert and oriented to person, place, and time.  Psychiatric:        Behavior: Behavior normal.        Thought Content: Thought content normal.        Judgment: Judgment normal.        Recent Labs       Lab Results  Component Value Date    WBC 7.3 09/23/2018    HGB 12.0 09/26/2018    HCT 39.4 09/26/2018    MCV 87.6 09/23/2018    PLT 479 (H) 09/23/2018        Chemistry    Labs (Brief)          Component Value Date/Time    NA 137 09/26/2018 0339    K 4.0 09/26/2018 0339    CL 105 09/26/2018 0339    CO2 24 09/26/2018 0339    BUN 9 09/26/2018 0339    CREATININE 0.70 09/26/2018 0339    CREATININE 0.81 09/08/2018 1148    CREATININE 0.74 05/18/2015 1356      Labs (Brief)          Component Value Date/Time    CALCIUM  8.6 (L) 09/26/2018 0339    ALKPHOS 52 09/08/2018 1148  AST 13 (L) 09/08/2018 1148    ALT 13 09/08/2018 1148     BILITOT 0.2 (L) 09/08/2018 1148         Encounter Diagnoses  Name Primary?   Polycythemia Yes   Iron  deficiency anemia secondary to inadequate dietary iron  intake    Thrombocytosis     Impression and Plan: Karen Wall is a 68 y.o. caucasian female.    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 She is s/p right nephrectomy- 09/2018. She is followed by Dr. Renda for this. Final surveillance imaging is scheduled for next year per patient.   Thrombocythemia: Essential Thrombocythemia, has IDA component as well. Has Hydrea  but has not started this as iron  has helped to lower her counts. Currently her Platelet level is 453  IDA: Multifactorial. S/p recent Cologuard which was negative. She has elected to forgo endoscopy or capsule studies at this time after discussion with PCP- given current yearly CT/MR abdominal/chest imaging. No other blood loss noted. Has done well with iron  infusion. Hgb stable today at 13.9. Normal MCV. Iron  studied pending  Polycythyemia- JAK2 as shown above. Suspect OSA. Sleep study referral placed. General OSA advisement given.    Disposition: Iron  studies pending. Will replace if needed  RTC 3 months APP, labs(CBC w/, CMP, iron , ferritin, retic)  Lauraine HERO [facility] PA-C  6/11/202012:28 PM

## 2024-07-06 ENCOUNTER — Ambulatory Visit: Payer: Self-pay | Admitting: Medical Oncology

## 2024-07-15 ENCOUNTER — Telehealth: Payer: Self-pay

## 2024-07-15 NOTE — Telephone Encounter (Signed)
 Called [facility]  to follow up on this patient's referral. Left a message on the Sleep Study Coordinator's voicemail with patient's information. Also, I secure chat Ms. Duwaine Dawn and she will follow up with her coordinator.

## 2024-07-20 ENCOUNTER — Other Ambulatory Visit (HOSPITAL_BASED_OUTPATIENT_CLINIC_OR_DEPARTMENT_OTHER): Payer: Self-pay

## 2024-08-04 ENCOUNTER — Other Ambulatory Visit: Payer: Self-pay

## 2024-08-04 ENCOUNTER — Encounter: Payer: Self-pay | Admitting: Cardiology

## 2024-08-04 ENCOUNTER — Ambulatory Visit: Attending: Cardiology | Admitting: Cardiology

## 2024-08-04 ENCOUNTER — Other Ambulatory Visit (HOSPITAL_BASED_OUTPATIENT_CLINIC_OR_DEPARTMENT_OTHER): Payer: Self-pay

## 2024-08-04 VITALS — BP 134/86 | HR 100 | Ht 63.0 in | Wt 211.0 lb

## 2024-08-04 DIAGNOSIS — I493 Ventricular premature depolarization: Secondary | ICD-10-CM | POA: Diagnosis not present

## 2024-08-04 DIAGNOSIS — I2510 Atherosclerotic heart disease of native coronary artery without angina pectoris: Secondary | ICD-10-CM | POA: Diagnosis not present

## 2024-08-04 DIAGNOSIS — I10 Essential (primary) hypertension: Secondary | ICD-10-CM | POA: Diagnosis not present

## 2024-08-04 MED ORDER — CARVEDILOL 3.125 MG PO TABS
3.1250 mg | ORAL_TABLET | Freq: Two times a day (BID) | ORAL | 3 refills | Status: AC
  Filled 2024-08-04: qty 180, 90d supply, fill #0

## 2024-08-04 MED ORDER — DILTIAZEM HCL ER COATED BEADS 120 MG PO CP24
120.0000 mg | ORAL_CAPSULE | Freq: Every day | ORAL | 3 refills | Status: AC
  Filled 2024-08-04: qty 90, 90d supply, fill #0

## 2024-08-04 NOTE — Progress Notes (Signed)
 " Cardiology Office Note:    Date:  08/04/2024   ID:  Karen Wall, DOB [birth year], MRN [id number]  PCP:  [name], MD  Cardiologist:  Lamar Fitch, MD    Referring MD: [name], MD   Chief Complaint  Patient presents with   Follow-up    History of Present Illness:     Karen Wall is a 69 y.o. female past medical history significant for supraventricular tachycardia, frequent ventricular ectopy total burden 16.6%, 2 years ago she did have nephrectomy secondary to cancer, coronary CT angio done in April 2024 showed only minimal disease with calcium  score 47.2.  Comes today to months for follow-up still complaining of having some palpitations it is disturbing to her she wants to do something about it.  No dizziness no passing out  Past Medical History:  Diagnosis Date   A-fib [facility])    history of   Acquired flat foot    Arthritis    feet ankles, and knees    Bilateral ankle pain 10/15/2017   Cancer of kidney parenchyma, right (HCC) 09/08/2018   Chest tightness 05/28/2014   Coronary artery disease minimal disease based on coronary CT angio from spring 2024, calcium  score 47.2 09/25/2022   Depression    Diverticulosis    Dyslipidemia 06/20/2023   Dyspnea    with exertion    Dysrhythmia    hx of afib with hyperthroid no issues currently    Essential hypertension 05/12/2018   GERD (gastroesophageal reflux disease)    H/O hiatal hernia 08/09/2018   Moderate to Large   Hyperlipidemia    Hypertension    Hypothyroidism    IDA (iron  deficiency anemia) 07/23/2016   Iron  malabsorption 11/22/2017   Low vitamin B12 level 03/15/2016   Numbness    Left side of face around mouth - unknown etiology   Numbness and tingling    left side - facial   Numbness and tingling of left side of face 05/06/2018   Palpitations    Occurred during hyperthyroid   PONV (postoperative nausea and vomiting)    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   Vitamin B 12 deficiency    Vitamin D  deficiency     Past Surgical History:  Procedure Laterality Date   BREAST BIOPSY     age 66 - negative   CHOLECYSTECTOMY  1992   OPERATIVE ULTRASOUND Right 09/25/2018   Procedure: OPERATIVE ULTRASOUND;  Surgeon: Renda Glance, MD;  Location: [facility];  Service: Urology;  Laterality: Right;   ROBOTIC ASSITED PARTIAL NEPHRECTOMY Right 09/25/2018   Procedure: XI ROBOTIC RIGHT  ASSITED PARTIAL NEPHRECTOMY;  Surgeon: Renda Glance, MD;  Location: [facility];  Service: Urology;  Laterality: Right;   THYROIDECTOMY N/A 07/03/2013   Procedure: TOTAL THYROIDECTOMY;  Surgeon: Krystal CHRISTELLA Spinner, MD;  Location: [facility];  Service: General;  Laterality: N/A;    Current Medications: Active Medications[1]   Allergies:   Penicillins and Aspirin   Social History   Socioeconomic History   Marital status: Divorced    Spouse  name: Not on file   Number of children: 1   Years of education: college   Highest education level: Associate degree: academic program  Occupational History   Occupation: RN  Tobacco Use   Smoking status: Never   Smokeless tobacco: Never  Vaping Use   Vaping status: Never Used  Substance and Sexual Activity   Alcohol use: No    Alcohol/week: 0.0 standard drinks of alcohol   Drug use: No   Sexual activity: Never  Other Topics Concern   Not on file  Social History Narrative   Lives at home alone.   Right-handed.   2 cups caffeine daily.   Social Drivers of Health   Tobacco Use: Low Risk (08/04/2024)   Patient History     Smoking Tobacco Use: Never    Smokeless Tobacco Use: Never    Passive Exposure: Not on file  Financial Resource Strain: Low Risk (06/27/2024)   Overall Financial Resource Strain (CARDIA)    Difficulty of Paying Living Expenses: Not very hard  Food Insecurity: No Food Insecurity (06/30/2024)   Epic    Worried About Programme Researcher, Broadcasting/film/video in the Last Year: Never true    Ran Out of Food in the Last Year: Never true  Transportation Needs: No Transportation Needs (06/30/2024)   Epic    Lack of Transportation (Medical): No    Lack of Transportation (Non-Medical): No  Physical Activity: Insufficiently Active (06/30/2024)   Exercise Vital Sign    Days of Exercise per Week: 2 days    Minutes of Exercise per Session: 20 min  Stress: Stress Concern Present (06/30/2024)   [employer] of Occupational Health - Occupational Stress Questionnaire    Feeling of Stress: Rather much  Social Connections: Unknown (06/30/2024)   Social Connection and Isolation Panel    Frequency of Communication with Friends and Family: More than three times a week    Frequency of Social Gatherings with Friends and Family: Twice a week    Attends Religious Services: More than 4 times per year    Active Member of [employer] or Organizations: No    Attends Banker Meetings: Not on file    Marital Status: Not on file  Depression (PHQ2-9): Low Risk (07/02/2024)   Depression (PHQ2-9)    PHQ-2 Score: 0  Alcohol Screen: Low Risk (06/11/2022)   Alcohol Screen    Last Alcohol Screening Score (AUDIT): 0  Housing: Low Risk (06/30/2024)   Epic    Unable to Pay for Housing in the Last Year: No    Number of Times Moved in the Last Year: 0    Homeless in the Last Year: No  Utilities: Not At Risk (06/30/2024)   Epic    Threatened with loss of utilities: No  Health Literacy: Adequate Health Literacy (06/30/2024)   B1300 Health Literacy    Frequency of need for help with medical instructions: Never     Family  History: The patient's family history includes CAD in her father; COPD in her mother; Congestive Heart Failure in her paternal grandmother; Diabetes in her father, maternal grandmother, and mother; Heart attack in her maternal grandfather and paternal grandfather; Hyperlipidemia in her father and mother; Hypertension in her father and mother; Mental illness in her father and maternal grandmother; Stroke in her maternal grandfather and maternal grandmother. ROS:   Please see the history of present illness.    All 14 point review of systems negative except as described per history of present illness  EKGs/Labs/Other  Studies Reviewed:         Recent Labs: 01/22/2024: TSH 0.79 07/02/2024: ALT 16; BUN 12; Creatinine 0.80; Hemoglobin 15.1; Platelet Count 453; Potassium 4.5; Sodium 140  Recent Lipid Panel    Component Value Date/Time   CHOL 145 07/25/2023 1201   CHOL 236 (H) 10/11/2022 1353   TRIG 106.0 07/25/2023 1201   HDL 46.70 07/25/2023 1201   HDL 61 10/11/2022 1353   CHOLHDL 3 07/25/2023 1201   VLDL 21.2 07/25/2023 1201   LDLCALC 77 07/25/2023 1201   LDLCALC 157 (H) 10/11/2022 1353   LDLCALC 123 (H) 09/02/2019 1511    Physical Exam:    VS:  BP 134/86   Pulse 100   Ht 5' 3 (1.6 m)   Wt 211 lb (95.7 kg)   SpO2 97%   BMI 37.38 kg/m     Wt Readings from Last 3 Encounters:  08/04/24 211 lb (95.7 kg)  07/02/24 211 lb (95.7 kg)  06/30/24 211 lb 9.6 oz (96 kg)     GEN:  Well nourished, well developed in no acute distress HEENT: Normal NECK: No JVD; No carotid bruits LYMPHATICS: No lymphadenopathy CARDIAC: RRR, no murmurs, no rubs, no gallops RESPIRATORY:  Clear to auscultation without rales, wheezing or rhonchi  ABDOMEN: Soft, non-tender, non-distended MUSCULOSKELETAL:  No edema; No deformity  SKIN: Warm and dry LOWER EXTREMITIES: no swelling NEUROLOGIC:  Alert and oriented x 3 PSYCHIATRIC:  Normal affect   ASSESSMENT:    1. Coronary artery disease involving native  coronary artery of native heart without angina pectoris   2. Ventricular ectopy   3. Essential hypertension    PLAN:    In order of problems listed above:  Frequent ventricular ectopy.  I will reduce dose of Cardizem  to 120 mg daily put her on 3.125 Cardizem  twice a day see if that works if that does not work we will consider additional measures including antiarrhythmic medication versus ablation. Essential hypertension blood pressure slightly on the higher side hopefully with changes for medication will be better control. Coronary artery disease only minimal with low calcium  score continue risk modifications.   Medication Adjustments/Labs and Tests Ordered: Current medicines are reviewed at length with the patient today.  Concerns regarding medicines are outlined above.  No orders of the defined types were placed in this encounter.  Medication changes: No orders of the defined types were placed in this encounter.   Signed, Lamar DOROTHA Fitch, MD, [facility] 08/04/2024 2:49 PM    [facility]    [1]  Current Meds  Medication Sig   acetaminophen  (TYLENOL ) 500 MG tablet Take 500 mg by mouth every 6 (six) hours as needed for pain.   Cholecalciferol 125 MCG (5000 UT) capsule Take 5,000 Units by mouth daily.   Cyanocobalamin  2500 MCG SUBL Place 1 tablet under the tongue every other day.   diltiazem  (CARDIZEM  CD) 240 MG 24 hr capsule Take 1 capsule (240 mg total) by mouth daily.   levothyroxine  (SYNTHROID ) 100 MCG tablet Take 1 tablet (100 mcg total) by mouth daily. Take every other day, alternate with 88 mcg   levothyroxine  (SYNTHROID ) 88 MCG tablet Take 1 tablet (88 mcg total) by mouth every other day. Alternate with the 100mcg dose.   LORazepam  (ATIVAN ) 1 MG tablet Take 1 tablet (1 mg total) by mouth as needed for anxiety or sleep.   metroNIDAZOLE  (METROGEL ) 1 % gel Apply topically daily as needed for facial rash.   rosuvastatin  (CRESTOR ) 20 MG tablet  Take 1 tablet  (20 mg total) by mouth daily.   "

## 2024-08-04 NOTE — Patient Instructions (Signed)
 Medication Instructions:   DECREASE: Cardizem  to Cardizem  CD 120mg  daily  START: Carvedilol  3.125mg  1 tablet twice daily   Lab Work: None Ordered If you have labs (blood work) drawn today and your tests are completely normal, you will receive your results only by: MyChart Message (if you have MyChart) OR A paper copy in the mail If you have any lab test that is abnormal or we need to change your treatment, we will call you to review the results.   Testing/Procedures: None Ordered   Follow-Up: At [facility] you and your health needs are our priority.  As part of our continuing mission to provide you with exceptional heart care, we have created designated Provider Care Teams.  These Care Teams include your primary Cardiologist (physician) and Advanced Practice Providers (APPs -  Physician Assistants and Nurse Practitioners) who all work together to provide you with the care you need, when you need it.  We recommend signing up for the patient portal called MyChart.  Sign up information is provided on this After Visit Summary.  MyChart is used to connect with patients for Virtual Visits (Telemedicine).  Patients are able to view lab/test results, encounter notes, upcoming appointments, etc.  Non-urgent messages can be sent to your provider as well.   To learn more about what you can do with MyChart, go to [email or web address].    Your next appointment:   3 month(s)  The format for your next appointment:   In Person  Provider:   Lamar Fitch, MD    Other Instructions NA

## 2024-08-04 NOTE — Addendum Note (Signed)
 Addended by: ARLOA MALLORY D on: 08/04/2024 02:56 PM   Modules accepted: Orders

## 2024-10-01 ENCOUNTER — Inpatient Hospital Stay: Admitting: Medical Oncology

## 2024-10-01 ENCOUNTER — Inpatient Hospital Stay

## 2024-11-12 ENCOUNTER — Ambulatory Visit: Admitting: Cardiology

## 2025-07-06 ENCOUNTER — Ambulatory Visit
